# Patient Record
Sex: Male | Born: 1935 | Race: White | Hispanic: No | Marital: Married | Smoking: Former smoker
Health system: Southern US, Community
[De-identification: ages and names within clinical notes are randomized; demographics above are authoritative.]

## PROBLEM LIST (undated history)

## (undated) DIAGNOSIS — I4891 Unspecified atrial fibrillation: Secondary | ICD-10-CM

## (undated) DIAGNOSIS — E039 Hypothyroidism, unspecified: Secondary | ICD-10-CM

## (undated) DIAGNOSIS — I10 Essential (primary) hypertension: Secondary | ICD-10-CM

## (undated) DIAGNOSIS — R270 Ataxia, unspecified: Secondary | ICD-10-CM

## (undated) DIAGNOSIS — G459 Transient cerebral ischemic attack, unspecified: Secondary | ICD-10-CM

## (undated) DIAGNOSIS — G629 Polyneuropathy, unspecified: Secondary | ICD-10-CM

## (undated) DIAGNOSIS — N400 Enlarged prostate without lower urinary tract symptoms: Secondary | ICD-10-CM

## (undated) DIAGNOSIS — E785 Hyperlipidemia, unspecified: Secondary | ICD-10-CM

## (undated) DIAGNOSIS — I495 Sick sinus syndrome: Secondary | ICD-10-CM

## (undated) HISTORY — DX: Benign prostatic hyperplasia without lower urinary tract symptoms: N40.0

## (undated) HISTORY — DX: Transient cerebral ischemic attack, unspecified: G45.9

## (undated) HISTORY — DX: Ataxia, unspecified: R27.0

## (undated) HISTORY — DX: Essential (primary) hypertension: I10

## (undated) HISTORY — DX: Sick sinus syndrome: I49.5

## (undated) HISTORY — DX: Hypothyroidism, unspecified: E03.9

## (undated) HISTORY — DX: Unspecified atrial fibrillation: I48.91

## (undated) HISTORY — DX: Polyneuropathy, unspecified: G62.9

## (undated) HISTORY — DX: Hyperlipidemia, unspecified: E78.5

## (undated) HISTORY — PX: PACEMAKER INSERTION: SHX728

## (undated) HISTORY — PX: INSERT / REPLACE / REMOVE PACEMAKER: SUR710

---

## 2002-05-06 ENCOUNTER — Ambulatory Visit (HOSPITAL_COMMUNITY): Admission: RE | Admit: 2002-05-06 | Discharge: 2002-05-06 | Payer: Self-pay | Admitting: Gastroenterology

## 2002-05-06 ENCOUNTER — Encounter: Payer: Self-pay | Admitting: Gastroenterology

## 2002-06-15 ENCOUNTER — Ambulatory Visit (HOSPITAL_COMMUNITY): Admission: RE | Admit: 2002-06-15 | Discharge: 2002-06-15 | Payer: Self-pay | Admitting: Gastroenterology

## 2003-11-01 ENCOUNTER — Encounter: Admission: RE | Admit: 2003-11-01 | Discharge: 2003-11-01 | Payer: Self-pay | Admitting: Neurology

## 2004-07-26 ENCOUNTER — Emergency Department (HOSPITAL_COMMUNITY): Admission: EM | Admit: 2004-07-26 | Discharge: 2004-07-26 | Payer: Self-pay | Admitting: Emergency Medicine

## 2004-08-06 ENCOUNTER — Emergency Department (HOSPITAL_COMMUNITY): Admission: EM | Admit: 2004-08-06 | Discharge: 2004-08-06 | Payer: Self-pay | Admitting: Emergency Medicine

## 2004-10-09 ENCOUNTER — Ambulatory Visit: Payer: Self-pay | Admitting: *Deleted

## 2004-10-10 ENCOUNTER — Ambulatory Visit: Payer: Self-pay | Admitting: Internal Medicine

## 2004-10-23 ENCOUNTER — Ambulatory Visit: Payer: Self-pay

## 2004-11-07 ENCOUNTER — Ambulatory Visit: Payer: Self-pay | Admitting: Cardiology

## 2004-11-21 ENCOUNTER — Ambulatory Visit: Payer: Self-pay

## 2004-12-09 ENCOUNTER — Ambulatory Visit: Payer: Self-pay | Admitting: Cardiology

## 2004-12-21 ENCOUNTER — Ambulatory Visit: Payer: Self-pay | Admitting: Cardiology

## 2004-12-26 ENCOUNTER — Ambulatory Visit: Payer: Self-pay | Admitting: Cardiology

## 2004-12-27 ENCOUNTER — Ambulatory Visit: Payer: Self-pay | Admitting: Internal Medicine

## 2005-01-14 ENCOUNTER — Ambulatory Visit: Payer: Self-pay | Admitting: Cardiology

## 2005-01-20 ENCOUNTER — Ambulatory Visit: Payer: Self-pay | Admitting: *Deleted

## 2005-02-10 ENCOUNTER — Ambulatory Visit: Payer: Self-pay

## 2005-02-13 ENCOUNTER — Ambulatory Visit: Payer: Self-pay | Admitting: Cardiology

## 2005-03-03 ENCOUNTER — Ambulatory Visit: Payer: Self-pay | Admitting: Cardiology

## 2005-03-20 ENCOUNTER — Ambulatory Visit: Payer: Self-pay | Admitting: Cardiology

## 2005-04-01 ENCOUNTER — Ambulatory Visit: Payer: Self-pay | Admitting: Internal Medicine

## 2005-04-09 ENCOUNTER — Ambulatory Visit: Payer: Self-pay | Admitting: Internal Medicine

## 2005-04-09 ENCOUNTER — Ambulatory Visit: Payer: Self-pay | Admitting: Cardiology

## 2005-04-21 ENCOUNTER — Ambulatory Visit: Payer: Self-pay | Admitting: Cardiology

## 2005-04-29 ENCOUNTER — Ambulatory Visit: Payer: Self-pay | Admitting: *Deleted

## 2005-05-22 ENCOUNTER — Ambulatory Visit: Payer: Self-pay | Admitting: Cardiology

## 2005-05-29 ENCOUNTER — Ambulatory Visit: Payer: Self-pay | Admitting: Cardiology

## 2005-06-11 ENCOUNTER — Ambulatory Visit: Payer: Self-pay | Admitting: Cardiology

## 2005-06-16 ENCOUNTER — Ambulatory Visit: Payer: Self-pay | Admitting: Internal Medicine

## 2005-06-23 ENCOUNTER — Ambulatory Visit: Payer: Self-pay | Admitting: Cardiology

## 2005-06-24 ENCOUNTER — Ambulatory Visit: Payer: Self-pay | Admitting: Cardiology

## 2005-07-01 ENCOUNTER — Ambulatory Visit: Payer: Self-pay | Admitting: Cardiology

## 2005-07-28 ENCOUNTER — Ambulatory Visit: Payer: Self-pay | Admitting: Cardiology

## 2005-07-29 ENCOUNTER — Ambulatory Visit: Payer: Self-pay | Admitting: Cardiovascular Disease

## 2005-08-28 ENCOUNTER — Ambulatory Visit: Payer: Self-pay | Admitting: Cardiology

## 2005-09-01 ENCOUNTER — Ambulatory Visit: Payer: Self-pay | Admitting: Cardiology

## 2005-09-05 ENCOUNTER — Ambulatory Visit: Payer: Self-pay | Admitting: Internal Medicine

## 2005-09-26 ENCOUNTER — Ambulatory Visit: Payer: Self-pay | Admitting: Internal Medicine

## 2005-09-29 ENCOUNTER — Ambulatory Visit: Payer: Self-pay | Admitting: Internal Medicine

## 2005-10-02 ENCOUNTER — Ambulatory Visit: Payer: Self-pay | Admitting: Cardiology

## 2005-10-15 ENCOUNTER — Ambulatory Visit: Payer: Self-pay | Admitting: Cardiology

## 2005-11-04 ENCOUNTER — Ambulatory Visit: Payer: Self-pay | Admitting: Cardiology

## 2005-11-12 ENCOUNTER — Ambulatory Visit: Payer: Self-pay | Admitting: Cardiology

## 2005-12-08 ENCOUNTER — Ambulatory Visit: Payer: Self-pay | Admitting: Cardiology

## 2005-12-12 ENCOUNTER — Ambulatory Visit: Payer: Self-pay | Admitting: Internal Medicine

## 2005-12-29 ENCOUNTER — Ambulatory Visit: Payer: Self-pay | Admitting: Cardiology

## 2006-01-13 ENCOUNTER — Ambulatory Visit: Payer: Self-pay | Admitting: Cardiology

## 2006-01-16 ENCOUNTER — Emergency Department (HOSPITAL_COMMUNITY): Admission: EM | Admit: 2006-01-16 | Discharge: 2006-01-16 | Payer: Self-pay | Admitting: Emergency Medicine

## 2006-02-11 ENCOUNTER — Ambulatory Visit: Payer: Self-pay | Admitting: Cardiology

## 2006-02-12 ENCOUNTER — Ambulatory Visit: Payer: Self-pay | Admitting: Cardiology

## 2006-03-19 ENCOUNTER — Ambulatory Visit: Payer: Self-pay | Admitting: Internal Medicine

## 2006-04-01 ENCOUNTER — Ambulatory Visit: Payer: Self-pay | Admitting: Cardiology

## 2006-04-20 ENCOUNTER — Ambulatory Visit: Payer: Self-pay | Admitting: Internal Medicine

## 2006-04-23 ENCOUNTER — Ambulatory Visit: Payer: Self-pay | Admitting: Internal Medicine

## 2006-04-30 ENCOUNTER — Ambulatory Visit: Payer: Self-pay | Admitting: Internal Medicine

## 2006-05-04 ENCOUNTER — Ambulatory Visit: Payer: Self-pay | Admitting: Cardiology

## 2006-05-04 ENCOUNTER — Ambulatory Visit: Payer: Self-pay | Admitting: Internal Medicine

## 2006-05-18 ENCOUNTER — Emergency Department (HOSPITAL_COMMUNITY): Admission: EM | Admit: 2006-05-18 | Discharge: 2006-05-18 | Payer: Self-pay | Admitting: Family Medicine

## 2006-05-19 ENCOUNTER — Ambulatory Visit: Payer: Self-pay | Admitting: Cardiology

## 2006-06-01 ENCOUNTER — Ambulatory Visit: Payer: Self-pay | Admitting: Cardiology

## 2006-06-01 ENCOUNTER — Ambulatory Visit: Payer: Self-pay | Admitting: Cardiovascular Disease

## 2006-07-01 ENCOUNTER — Ambulatory Visit: Payer: Self-pay | Admitting: *Deleted

## 2006-07-22 ENCOUNTER — Ambulatory Visit: Payer: Self-pay | Admitting: Cardiology

## 2006-07-31 ENCOUNTER — Ambulatory Visit: Payer: Self-pay | Admitting: Cardiology

## 2006-08-18 ENCOUNTER — Encounter: Payer: Self-pay | Admitting: Internal Medicine

## 2006-08-18 LAB — CONVERTED CEMR LAB: PSA: 0.19 ng/mL

## 2006-08-19 ENCOUNTER — Ambulatory Visit: Payer: Self-pay | Admitting: Cardiology

## 2006-08-28 ENCOUNTER — Ambulatory Visit: Payer: Self-pay | Admitting: Cardiology

## 2006-09-28 ENCOUNTER — Ambulatory Visit: Payer: Self-pay | Admitting: Cardiology

## 2006-09-29 ENCOUNTER — Ambulatory Visit: Payer: Self-pay | Admitting: Cardiology

## 2006-10-26 ENCOUNTER — Ambulatory Visit: Payer: Self-pay | Admitting: Cardiology

## 2006-10-29 ENCOUNTER — Ambulatory Visit: Payer: Self-pay | Admitting: Internal Medicine

## 2006-11-02 ENCOUNTER — Ambulatory Visit: Payer: Self-pay | Admitting: Cardiology

## 2006-11-02 LAB — CONVERTED CEMR LAB
ALT: 15 units/L (ref 0–40)
AST: 21 units/L (ref 0–37)
Albumin: 3.9 g/dL (ref 3.5–5.2)
Alkaline Phosphatase: 66 units/L (ref 39–117)
BUN: 21 mg/dL (ref 6–23)
Bilirubin, Direct: 0.1 mg/dL (ref 0.0–0.3)
CO2: 31 meq/L (ref 19–32)
Calcium: 9.1 mg/dL (ref 8.4–10.5)
Chloride: 108 meq/L (ref 96–112)
Chol/HDL Ratio, serum: 3.8
Cholesterol: 160 mg/dL (ref 0–200)
Creatinine, Ser: 1.2 mg/dL (ref 0.4–1.5)
GFR calc non Af Amer: 62 mL/min
Glomerular Filtration Rate, Af Am: 75 mL/min/{1.73_m2}
Glucose, Bld: 111 mg/dL — ABNORMAL HIGH (ref 70–99)
HCT: 40.5 % (ref 39.0–52.0)
HDL: 42.5 mg/dL (ref >39.0)
Hemoglobin: 13.6 g/dL (ref 13.0–17.0)
LDL Cholesterol: 97 mg/dL (ref 0–99)
MCHC: 33.5 g/dL (ref 30.0–36.0)
MCV: 95.7 fL (ref 78.0–100.0)
Platelets: 219 10*3/uL (ref 150–400)
Potassium: 4.8 meq/L (ref 3.5–5.1)
RBC: 4.23 M/uL (ref 4.22–5.81)
RDW: 12.2 % (ref 11.5–14.6)
Sodium: 145 meq/L (ref 135–145)
TSH: 8.33 microintl units/mL — ABNORMAL HIGH (ref 0.35–5.50)
Total Bilirubin: 0.7 mg/dL (ref 0.3–1.2)
Total Protein: 7.2 g/dL (ref 6.0–8.3)
Triglyceride fasting, serum: 105 mg/dL (ref 0–149)
VLDL: 21 mg/dL (ref 0–40)
WBC: 4.5 10*3/uL (ref 4.5–10.5)

## 2006-11-09 ENCOUNTER — Ambulatory Visit: Payer: Self-pay | Admitting: Cardiology

## 2006-12-03 ENCOUNTER — Ambulatory Visit: Payer: Self-pay | Admitting: Internal Medicine

## 2006-12-03 ENCOUNTER — Ambulatory Visit: Payer: Self-pay | Admitting: Cardiology

## 2006-12-03 LAB — CONVERTED CEMR LAB: TSH: 4.95 microintl units/mL (ref 0.35–5.50)

## 2006-12-07 ENCOUNTER — Ambulatory Visit: Payer: Self-pay | Admitting: Internal Medicine

## 2006-12-21 ENCOUNTER — Ambulatory Visit: Payer: Self-pay | Admitting: Cardiology

## 2006-12-25 ENCOUNTER — Encounter: Admission: RE | Admit: 2006-12-25 | Discharge: 2006-12-25 | Payer: Self-pay | Admitting: Neurology

## 2007-01-01 ENCOUNTER — Ambulatory Visit: Payer: Self-pay | Admitting: Internal Medicine

## 2007-01-05 ENCOUNTER — Ambulatory Visit (HOSPITAL_COMMUNITY): Admission: RE | Admit: 2007-01-05 | Discharge: 2007-01-05 | Payer: Self-pay | Admitting: Internal Medicine

## 2007-01-19 ENCOUNTER — Ambulatory Visit: Payer: Self-pay | Admitting: Cardiology

## 2007-01-29 ENCOUNTER — Ambulatory Visit: Payer: Self-pay | Admitting: Internal Medicine

## 2007-02-08 ENCOUNTER — Ambulatory Visit: Payer: Self-pay | Admitting: Cardiology

## 2007-02-09 ENCOUNTER — Encounter: Admission: RE | Admit: 2007-02-09 | Discharge: 2007-02-09 | Payer: Self-pay | Admitting: Internal Medicine

## 2007-02-15 ENCOUNTER — Ambulatory Visit: Payer: Self-pay | Admitting: Cardiology

## 2007-02-22 ENCOUNTER — Ambulatory Visit: Payer: Self-pay | Admitting: Cardiology

## 2007-03-09 ENCOUNTER — Emergency Department (HOSPITAL_COMMUNITY): Admission: EM | Admit: 2007-03-09 | Discharge: 2007-03-09 | Payer: Self-pay | Admitting: Emergency Medicine

## 2007-03-17 ENCOUNTER — Ambulatory Visit: Payer: Self-pay | Admitting: Cardiology

## 2007-03-24 ENCOUNTER — Ambulatory Visit: Payer: Self-pay | Admitting: *Deleted

## 2007-04-12 ENCOUNTER — Ambulatory Visit: Payer: Self-pay | Admitting: Cardiology

## 2007-04-22 ENCOUNTER — Ambulatory Visit: Payer: Self-pay | Admitting: Cardiology

## 2007-04-22 ENCOUNTER — Ambulatory Visit: Payer: Self-pay

## 2007-04-22 LAB — CONVERTED CEMR LAB
ALT: 16 units/L (ref 0–40)
AST: 23 units/L (ref 0–37)
Albumin: 3.8 g/dL (ref 3.5–5.2)
Alkaline Phosphatase: 68 units/L (ref 39–117)
BUN: 18 mg/dL (ref 6–23)
Basophils Absolute: 0 10*3/uL (ref 0.0–0.1)
Basophils Relative: 0.4 % (ref 0.0–1.0)
Bilirubin, Direct: 0.1 mg/dL (ref 0.0–0.3)
CO2: 31 meq/L (ref 19–32)
Calcium: 8.8 mg/dL (ref 8.4–10.5)
Chloride: 107 meq/L (ref 96–112)
Cholesterol: 166 mg/dL (ref 0–200)
Creatinine, Ser: 1.2 mg/dL (ref 0.4–1.5)
Eosinophils Absolute: 0.1 10*3/uL (ref 0.0–0.6)
Eosinophils Relative: 2.2 % (ref 0.0–5.0)
GFR calc Af Amer: 75 mL/min
GFR calc non Af Amer: 62 mL/min
Glucose, Bld: 99 mg/dL (ref 70–99)
HCT: 37.8 % — ABNORMAL LOW (ref 39.0–52.0)
HDL: 34.3 mg/dL — ABNORMAL LOW (ref >39.0)
Hemoglobin: 12.8 g/dL — ABNORMAL LOW (ref 13.0–17.0)
INR: 2.5 — ABNORMAL HIGH (ref 0.9–2.0)
LDL Cholesterol: 101 mg/dL — ABNORMAL HIGH (ref 0–99)
Lymphocytes Relative: 28.3 % (ref 12.0–46.0)
MCHC: 34 g/dL (ref 30.0–36.0)
MCV: 94.1 fL (ref 78.0–100.0)
Monocytes Absolute: 0.4 10*3/uL (ref 0.2–0.7)
Monocytes Relative: 8.6 % (ref 3.0–11.0)
Neutro Abs: 2.6 10*3/uL (ref 1.4–7.7)
Neutrophils Relative %: 60.5 % (ref 43.0–77.0)
Platelets: 225 10*3/uL (ref 150–400)
Potassium: 4.1 meq/L (ref 3.5–5.1)
Prothrombin Time: 20 s — ABNORMAL HIGH (ref 10.0–14.0)
RBC: 4.01 M/uL — ABNORMAL LOW (ref 4.22–5.81)
RDW: 12.2 % (ref 11.5–14.6)
Sodium: 143 meq/L (ref 135–145)
TSH: 4.03 microintl units/mL (ref 0.35–5.50)
Total Bilirubin: 0.7 mg/dL (ref 0.3–1.2)
Total CHOL/HDL Ratio: 4.8
Total Protein: 6.7 g/dL (ref 6.0–8.3)
Triglycerides: 152 mg/dL — ABNORMAL HIGH (ref 0–149)
VLDL: 30 mg/dL (ref 0–40)
WBC: 4.3 10*3/uL — ABNORMAL LOW (ref 4.5–10.5)

## 2007-05-04 ENCOUNTER — Ambulatory Visit: Payer: Self-pay | Admitting: Cardiology

## 2007-05-10 ENCOUNTER — Ambulatory Visit: Payer: Self-pay | Admitting: Cardiology

## 2007-05-20 ENCOUNTER — Ambulatory Visit: Payer: Self-pay | Admitting: Internal Medicine

## 2007-06-03 ENCOUNTER — Ambulatory Visit: Payer: Self-pay | Admitting: Internal Medicine

## 2007-06-17 ENCOUNTER — Ambulatory Visit: Payer: Self-pay | Admitting: Cardiology

## 2007-07-05 ENCOUNTER — Ambulatory Visit: Payer: Self-pay | Admitting: Cardiology

## 2007-07-15 ENCOUNTER — Ambulatory Visit: Payer: Self-pay | Admitting: Cardiovascular Disease

## 2007-07-28 ENCOUNTER — Ambulatory Visit: Payer: Self-pay | Admitting: Cardiology

## 2007-07-28 LAB — CONVERTED CEMR LAB
ALT: 16 units/L (ref 0–53)
AST: 22 units/L (ref 0–37)
Albumin: 4 g/dL (ref 3.5–5.2)
Alkaline Phosphatase: 72 units/L (ref 39–117)
Bilirubin, Direct: 0.1 mg/dL (ref 0.0–0.3)
CK-MB: 1.6 ng/mL (ref 0.3–4.0)
Total Bilirubin: 0.5 mg/dL (ref 0.3–1.2)
Total CK: 74 units/L (ref 7–195)
Total Protein: 7.3 g/dL (ref 6.0–8.3)

## 2007-08-05 ENCOUNTER — Ambulatory Visit: Payer: Self-pay | Admitting: Cardiology

## 2007-08-16 ENCOUNTER — Ambulatory Visit: Payer: Self-pay | Admitting: Internal Medicine

## 2007-08-23 ENCOUNTER — Encounter: Payer: Self-pay | Admitting: Internal Medicine

## 2007-08-23 DIAGNOSIS — I6529 Occlusion and stenosis of unspecified carotid artery: Secondary | ICD-10-CM

## 2007-08-23 DIAGNOSIS — I495 Sick sinus syndrome: Secondary | ICD-10-CM

## 2007-08-23 DIAGNOSIS — E785 Hyperlipidemia, unspecified: Secondary | ICD-10-CM | POA: Insufficient documentation

## 2007-08-23 DIAGNOSIS — Z8679 Personal history of other diseases of the circulatory system: Secondary | ICD-10-CM | POA: Insufficient documentation

## 2007-08-26 ENCOUNTER — Encounter: Payer: Self-pay | Admitting: Internal Medicine

## 2007-08-26 DIAGNOSIS — I4891 Unspecified atrial fibrillation: Secondary | ICD-10-CM | POA: Insufficient documentation

## 2007-08-30 ENCOUNTER — Ambulatory Visit: Payer: Self-pay | Admitting: Cardiology

## 2007-08-30 ENCOUNTER — Ambulatory Visit: Payer: Self-pay | Admitting: Cardiovascular Disease

## 2007-09-27 ENCOUNTER — Ambulatory Visit: Payer: Self-pay | Admitting: Cardiology

## 2007-09-29 ENCOUNTER — Ambulatory Visit: Payer: Self-pay | Admitting: Internal Medicine

## 2007-09-30 ENCOUNTER — Ambulatory Visit: Payer: Self-pay | Admitting: Cardiology

## 2007-09-30 LAB — CONVERTED CEMR LAB
BUN: 20 mg/dL (ref 6–23)
Basophils Absolute: 0 10*3/uL (ref 0.0–0.1)
Basophils Relative: 0.6 % (ref 0.0–1.0)
CO2: 31 meq/L (ref 19–32)
Calcium: 9.3 mg/dL (ref 8.4–10.5)
Chloride: 104 meq/L (ref 96–112)
Creatinine, Ser: 1.1 mg/dL (ref 0.4–1.5)
Eosinophils Absolute: 0.1 10*3/uL (ref 0.0–0.6)
Eosinophils Relative: 1 % (ref 0.0–5.0)
GFR calc Af Amer: 83 mL/min
GFR calc non Af Amer: 68 mL/min
Glucose, Bld: 109 mg/dL — ABNORMAL HIGH (ref 70–99)
HCT: 40 % (ref 39.0–52.0)
Hemoglobin: 13.7 g/dL (ref 13.0–17.0)
INR: 2.6 — ABNORMAL HIGH (ref 0.8–1.0)
Lymphocytes Relative: 23.2 % (ref 12.0–46.0)
MCHC: 34.1 g/dL (ref 30.0–36.0)
MCV: 95.6 fL (ref 78.0–100.0)
Monocytes Absolute: 0.6 10*3/uL (ref 0.2–0.7)
Monocytes Relative: 8.8 % (ref 3.0–11.0)
Neutro Abs: 4.4 10*3/uL (ref 1.4–7.7)
Neutrophils Relative %: 66.4 % (ref 43.0–77.0)
Platelets: 230 10*3/uL (ref 150–400)
Potassium: 4.5 meq/L (ref 3.5–5.1)
Prothrombin Time: 20.1 s — ABNORMAL HIGH (ref 10.9–13.3)
RBC: 4.18 M/uL — ABNORMAL LOW (ref 4.22–5.81)
RDW: 12.5 % (ref 11.5–14.6)
Sodium: 143 meq/L (ref 135–145)
WBC: 6.7 10*3/uL (ref 4.5–10.5)
aPTT: 33.3 s — ABNORMAL HIGH (ref 21.7–29.8)

## 2007-10-04 ENCOUNTER — Ambulatory Visit: Payer: Self-pay | Admitting: Cardiology

## 2007-10-04 ENCOUNTER — Ambulatory Visit (HOSPITAL_COMMUNITY): Admission: RE | Admit: 2007-10-04 | Discharge: 2007-10-04 | Payer: Self-pay | Admitting: Cardiology

## 2007-10-18 ENCOUNTER — Ambulatory Visit: Payer: Self-pay

## 2007-10-22 ENCOUNTER — Ambulatory Visit: Payer: Self-pay | Admitting: Internal Medicine

## 2007-10-22 ENCOUNTER — Telehealth (INDEPENDENT_AMBULATORY_CARE_PROVIDER_SITE_OTHER): Payer: Self-pay | Admitting: *Deleted

## 2007-10-25 ENCOUNTER — Ambulatory Visit: Payer: Self-pay | Admitting: Internal Medicine

## 2007-10-25 DIAGNOSIS — R310 Gross hematuria: Secondary | ICD-10-CM | POA: Insufficient documentation

## 2007-10-25 DIAGNOSIS — I10 Essential (primary) hypertension: Secondary | ICD-10-CM | POA: Insufficient documentation

## 2007-10-25 LAB — CONVERTED CEMR LAB
BUN: 26 mg/dL — ABNORMAL HIGH (ref 6–23)
Bilirubin Urine: NEGATIVE
Creatinine, Ser: 1.3 mg/dL (ref 0.4–1.5)
Crystals: NEGATIVE
Hemoglobin, Urine: NEGATIVE
Leukocytes, UA: NEGATIVE
Mucus, UA: NEGATIVE
Nitrite: NEGATIVE
RBC / HPF: NONE SEEN
Specific Gravity, Urine: >=1.03 (ref 1.000–1.03)
Total Protein, Urine: NEGATIVE mg/dL
Urine Glucose: NEGATIVE mg/dL
Urobilinogen, UA: 0.2 (ref 0.0–1.0)
pH: 5 (ref 5.0–8.0)

## 2007-10-26 ENCOUNTER — Ambulatory Visit: Payer: Self-pay | Admitting: Internal Medicine

## 2007-10-26 DIAGNOSIS — J309 Allergic rhinitis, unspecified: Secondary | ICD-10-CM | POA: Insufficient documentation

## 2007-10-26 DIAGNOSIS — I739 Peripheral vascular disease, unspecified: Secondary | ICD-10-CM

## 2007-10-26 DIAGNOSIS — E039 Hypothyroidism, unspecified: Secondary | ICD-10-CM | POA: Insufficient documentation

## 2007-10-27 ENCOUNTER — Ambulatory Visit: Payer: Self-pay | Admitting: Cardiology

## 2007-11-01 ENCOUNTER — Telehealth (INDEPENDENT_AMBULATORY_CARE_PROVIDER_SITE_OTHER): Payer: Self-pay | Admitting: *Deleted

## 2007-11-02 ENCOUNTER — Telehealth (INDEPENDENT_AMBULATORY_CARE_PROVIDER_SITE_OTHER): Payer: Self-pay | Admitting: *Deleted

## 2007-11-05 ENCOUNTER — Encounter: Payer: Self-pay | Admitting: Internal Medicine

## 2007-11-19 ENCOUNTER — Ambulatory Visit: Payer: Self-pay | Admitting: Cardiology

## 2007-11-22 ENCOUNTER — Ambulatory Visit: Payer: Self-pay | Admitting: Cardiology

## 2007-11-22 LAB — CONVERTED CEMR LAB
ALT: 16 units/L (ref 0–53)
AST: 21 units/L (ref 0–37)
Albumin: 3.8 g/dL (ref 3.5–5.2)
Alkaline Phosphatase: 69 units/L (ref 39–117)
Bilirubin, Direct: 0.1 mg/dL (ref 0.0–0.3)
Cholesterol: 167 mg/dL (ref 0–200)
HDL: 40.8 mg/dL (ref >39.0)
LDL Cholesterol: 102 mg/dL — ABNORMAL HIGH (ref 0–99)
Total Bilirubin: 0.7 mg/dL (ref 0.3–1.2)
Total CHOL/HDL Ratio: 4.1
Total Protein: 7.5 g/dL (ref 6.0–8.3)
Triglycerides: 120 mg/dL (ref 0–149)
VLDL: 24 mg/dL (ref 0–40)

## 2007-11-29 ENCOUNTER — Ambulatory Visit: Payer: Self-pay | Admitting: Cardiology

## 2007-12-17 ENCOUNTER — Ambulatory Visit: Payer: Self-pay | Admitting: Cardiology

## 2007-12-30 ENCOUNTER — Ambulatory Visit: Payer: Self-pay | Admitting: Cardiology

## 2008-01-17 ENCOUNTER — Ambulatory Visit: Payer: Self-pay | Admitting: Cardiovascular Disease

## 2008-02-14 ENCOUNTER — Ambulatory Visit: Payer: Self-pay | Admitting: Internal Medicine

## 2008-03-14 ENCOUNTER — Ambulatory Visit: Payer: Self-pay

## 2008-03-20 ENCOUNTER — Ambulatory Visit: Payer: Self-pay | Admitting: Cardiology

## 2008-04-12 ENCOUNTER — Ambulatory Visit: Payer: Self-pay | Admitting: Cardiology

## 2008-04-13 ENCOUNTER — Ambulatory Visit: Payer: Self-pay | Admitting: Cardiology

## 2008-04-20 ENCOUNTER — Ambulatory Visit: Payer: Self-pay | Admitting: Internal Medicine

## 2008-05-22 ENCOUNTER — Ambulatory Visit: Payer: Self-pay | Admitting: Internal Medicine

## 2008-06-21 ENCOUNTER — Ambulatory Visit: Payer: Self-pay | Admitting: Cardiology

## 2008-06-28 ENCOUNTER — Ambulatory Visit: Payer: Self-pay

## 2008-06-28 ENCOUNTER — Ambulatory Visit: Payer: Self-pay | Admitting: Cardiology

## 2008-07-05 ENCOUNTER — Ambulatory Visit: Payer: Self-pay | Admitting: Cardiology

## 2008-07-19 ENCOUNTER — Ambulatory Visit: Payer: Self-pay | Admitting: Cardiology

## 2008-07-24 ENCOUNTER — Ambulatory Visit: Payer: Self-pay | Admitting: Cardiology

## 2008-08-03 ENCOUNTER — Ambulatory Visit: Payer: Self-pay | Admitting: Internal Medicine

## 2008-08-03 DIAGNOSIS — G4700 Insomnia, unspecified: Secondary | ICD-10-CM

## 2008-08-03 DIAGNOSIS — R5383 Other fatigue: Secondary | ICD-10-CM

## 2008-08-03 DIAGNOSIS — F411 Generalized anxiety disorder: Secondary | ICD-10-CM

## 2008-08-03 DIAGNOSIS — N400 Enlarged prostate without lower urinary tract symptoms: Secondary | ICD-10-CM

## 2008-08-03 DIAGNOSIS — R5381 Other malaise: Secondary | ICD-10-CM

## 2008-08-03 LAB — CONVERTED CEMR LAB
ALT: 17 units/L (ref 0–53)
AST: 22 units/L (ref 0–37)
Albumin: 4.2 g/dL (ref 3.5–5.2)
Alkaline Phosphatase: 77 units/L (ref 39–117)
BUN: 24 mg/dL — ABNORMAL HIGH (ref 6–23)
Bacteria, UA: NEGATIVE
Basophils Absolute: 0 10*3/uL (ref 0.0–0.1)
Basophils Relative: 0.1 % (ref 0.0–3.0)
Bilirubin Urine: NEGATIVE
Bilirubin, Direct: 0.1 mg/dL (ref 0.0–0.3)
CO2: 30 meq/L (ref 19–32)
Calcium: 9.2 mg/dL (ref 8.4–10.5)
Chloride: 107 meq/L (ref 96–112)
Cholesterol: 149 mg/dL (ref 0–200)
Creatinine, Ser: 1.2 mg/dL (ref 0.4–1.5)
Crystals: NEGATIVE
Eosinophils Absolute: 0.1 10*3/uL (ref 0.0–0.7)
Eosinophils Relative: 1.6 % (ref 0.0–5.0)
GFR calc Af Amer: 75 mL/min
GFR calc non Af Amer: 62 mL/min
Glucose, Bld: 116 mg/dL — ABNORMAL HIGH (ref 70–99)
HCT: 38.4 % — ABNORMAL LOW (ref 39.0–52.0)
HDL: 34.9 mg/dL — ABNORMAL LOW (ref >39.0)
Hemoglobin: 13.4 g/dL (ref 13.0–17.0)
Ketones, ur: NEGATIVE mg/dL
LDL Cholesterol: 91 mg/dL (ref 0–99)
Leukocytes, UA: NEGATIVE
Lymphocytes Relative: 26.5 % (ref 12.0–46.0)
MCHC: 34.9 g/dL (ref 30.0–36.0)
MCV: 94.9 fL (ref 78.0–100.0)
Monocytes Absolute: 0.5 10*3/uL (ref 0.1–1.0)
Monocytes Relative: 8.2 % (ref 3.0–12.0)
Neutro Abs: 3.7 10*3/uL (ref 1.4–7.7)
Neutrophils Relative %: 63.6 % (ref 43.0–77.0)
Nitrite: NEGATIVE
PSA: 0.11 ng/mL (ref 0.10–4.00)
Platelets: 223 10*3/uL (ref 150–400)
Potassium: 5.4 meq/L — ABNORMAL HIGH (ref 3.5–5.1)
RBC: 4.05 M/uL — ABNORMAL LOW (ref 4.22–5.81)
RDW: 12.4 % (ref 11.5–14.6)
Sodium: 142 meq/L (ref 135–145)
Specific Gravity, Urine: 1.015 (ref 1.000–1.03)
Squamous Epithelial / HPF: NEGATIVE /lpf
TSH: 3.98 microintl units/mL (ref 0.35–5.50)
Total Bilirubin: 0.8 mg/dL (ref 0.3–1.2)
Total CHOL/HDL Ratio: 4.3
Total Protein, Urine: NEGATIVE mg/dL
Total Protein: 7.5 g/dL (ref 6.0–8.3)
Triglycerides: 117 mg/dL (ref 0–149)
Urine Glucose: NEGATIVE mg/dL
Urobilinogen, UA: 0.2 (ref 0.0–1.0)
VLDL: 23 mg/dL (ref 0–40)
WBC: 5.8 10*3/uL (ref 4.5–10.5)
pH: 6.5 (ref 5.0–8.0)

## 2008-08-09 ENCOUNTER — Ambulatory Visit: Payer: Self-pay | Admitting: Internal Medicine

## 2008-08-11 ENCOUNTER — Telehealth (INDEPENDENT_AMBULATORY_CARE_PROVIDER_SITE_OTHER): Payer: Self-pay | Admitting: *Deleted

## 2008-08-18 ENCOUNTER — Encounter: Admission: RE | Admit: 2008-08-18 | Discharge: 2008-08-18 | Payer: Self-pay | Admitting: Orthopedic Surgery

## 2008-09-12 ENCOUNTER — Ambulatory Visit: Payer: Self-pay | Admitting: Internal Medicine

## 2008-09-14 ENCOUNTER — Ambulatory Visit: Payer: Self-pay | Admitting: Cardiology

## 2008-09-27 ENCOUNTER — Ambulatory Visit: Payer: Self-pay | Admitting: Cardiology

## 2008-09-28 ENCOUNTER — Ambulatory Visit: Payer: Self-pay | Admitting: Internal Medicine

## 2008-09-28 DIAGNOSIS — R3915 Urgency of urination: Secondary | ICD-10-CM | POA: Insufficient documentation

## 2008-09-28 DIAGNOSIS — R946 Abnormal results of thyroid function studies: Secondary | ICD-10-CM

## 2008-09-28 LAB — CONVERTED CEMR LAB
Bacteria, UA: NEGATIVE
Bilirubin Urine: NEGATIVE
Crystals: NEGATIVE
Ketones, ur: NEGATIVE mg/dL
Leukocytes, UA: NEGATIVE
Nitrite: NEGATIVE
Specific Gravity, Urine: 1.015 (ref 1.000–1.03)
Squamous Epithelial / HPF: NEGATIVE /lpf
TSH: 4.21 microintl units/mL (ref 0.35–5.50)
Total Protein, Urine: NEGATIVE mg/dL
Urine Glucose: NEGATIVE mg/dL
Urobilinogen, UA: 0.2 (ref 0.0–1.0)
WBC, UA: NONE SEEN cells/hpf
pH: 6.5 (ref 5.0–8.0)

## 2008-10-11 ENCOUNTER — Ambulatory Visit: Payer: Self-pay | Admitting: Internal Medicine

## 2008-10-17 ENCOUNTER — Telehealth (INDEPENDENT_AMBULATORY_CARE_PROVIDER_SITE_OTHER): Payer: Self-pay | Admitting: *Deleted

## 2008-10-27 ENCOUNTER — Ambulatory Visit: Payer: Self-pay | Admitting: Internal Medicine

## 2008-11-27 ENCOUNTER — Ambulatory Visit: Payer: Self-pay | Admitting: Cardiology

## 2008-12-06 ENCOUNTER — Encounter: Payer: Self-pay | Admitting: Internal Medicine

## 2008-12-15 ENCOUNTER — Ambulatory Visit: Payer: Self-pay | Admitting: Cardiology

## 2008-12-19 ENCOUNTER — Ambulatory Visit: Payer: Self-pay | Admitting: Cardiology

## 2008-12-19 LAB — CONVERTED CEMR LAB
ALT: 14 units/L (ref 0–53)
AST: 20 units/L (ref 0–37)
Albumin: 3.9 g/dL (ref 3.5–5.2)
Alkaline Phosphatase: 71 units/L (ref 39–117)
BUN: 29 mg/dL — ABNORMAL HIGH (ref 6–23)
Basophils Absolute: 0.1 10*3/uL (ref 0.0–0.1)
Basophils Relative: 1 % (ref 0.0–3.0)
Bilirubin, Direct: 0.1 mg/dL (ref 0.0–0.3)
CO2: 29 meq/L (ref 19–32)
Calcium: 9.2 mg/dL (ref 8.4–10.5)
Chloride: 111 meq/L (ref 96–112)
Cholesterol: 128 mg/dL (ref 0–200)
Creatinine, Ser: 1.4 mg/dL (ref 0.4–1.5)
Eosinophils Absolute: 0.1 10*3/uL (ref 0.0–0.7)
Eosinophils Relative: 1.8 % (ref 0.0–5.0)
GFR calc Af Amer: 62 mL/min
GFR calc non Af Amer: 52 mL/min
Glucose, Bld: 140 mg/dL — ABNORMAL HIGH (ref 70–99)
HCT: 39.5 % (ref 39.0–52.0)
HDL: 39.3 mg/dL (ref >39.0)
Hemoglobin: 13.5 g/dL (ref 13.0–17.0)
LDL Cholesterol: 71 mg/dL (ref 0–99)
Lymphocytes Relative: 27.6 % (ref 12.0–46.0)
MCHC: 34.2 g/dL (ref 30.0–36.0)
MCV: 96 fL (ref 78.0–100.0)
Monocytes Absolute: 0.5 10*3/uL (ref 0.1–1.0)
Monocytes Relative: 7.7 % (ref 3.0–12.0)
Neutro Abs: 3.8 10*3/uL (ref 1.4–7.7)
Neutrophils Relative %: 61.9 % (ref 43.0–77.0)
Platelets: 195 10*3/uL (ref 150–400)
Potassium: 4.3 meq/L (ref 3.5–5.1)
RBC: 4.11 M/uL — ABNORMAL LOW (ref 4.22–5.81)
RDW: 12 % (ref 11.5–14.6)
Sodium: 144 meq/L (ref 135–145)
Total Bilirubin: 0.7 mg/dL (ref 0.3–1.2)
Total CHOL/HDL Ratio: 3.3
Total Protein: 6.9 g/dL (ref 6.0–8.3)
Triglycerides: 87 mg/dL (ref 0–149)
VLDL: 17 mg/dL (ref 0–40)
WBC: 6.2 10*3/uL (ref 4.5–10.5)

## 2008-12-21 ENCOUNTER — Ambulatory Visit: Payer: Self-pay | Admitting: Cardiology

## 2008-12-21 LAB — CONVERTED CEMR LAB
Hgb A1c MFr Bld: 6.2 % — ABNORMAL HIGH (ref 4.6–6.0)
TSH: 4.25 microintl units/mL (ref 0.35–5.50)

## 2008-12-27 ENCOUNTER — Ambulatory Visit: Payer: Self-pay | Admitting: Cardiology

## 2009-01-10 ENCOUNTER — Ambulatory Visit: Payer: Self-pay | Admitting: Cardiology

## 2009-01-17 ENCOUNTER — Ambulatory Visit: Payer: Self-pay | Admitting: Internal Medicine

## 2009-01-25 ENCOUNTER — Ambulatory Visit: Payer: Self-pay | Admitting: Internal Medicine

## 2009-02-15 ENCOUNTER — Ambulatory Visit: Payer: Self-pay | Admitting: Internal Medicine

## 2009-02-17 ENCOUNTER — Telehealth: Payer: Self-pay | Admitting: Family Medicine

## 2009-02-23 DIAGNOSIS — R001 Bradycardia, unspecified: Secondary | ICD-10-CM | POA: Insufficient documentation

## 2009-02-23 DIAGNOSIS — R279 Unspecified lack of coordination: Secondary | ICD-10-CM | POA: Insufficient documentation

## 2009-02-23 DIAGNOSIS — I4949 Other premature depolarization: Secondary | ICD-10-CM

## 2009-03-09 ENCOUNTER — Encounter (INDEPENDENT_AMBULATORY_CARE_PROVIDER_SITE_OTHER): Payer: Self-pay | Admitting: Radiology

## 2009-03-12 ENCOUNTER — Ambulatory Visit: Payer: Self-pay | Admitting: Internal Medicine

## 2009-03-21 ENCOUNTER — Ambulatory Visit: Payer: Self-pay | Admitting: Cardiology

## 2009-03-21 ENCOUNTER — Ambulatory Visit: Payer: Self-pay

## 2009-04-10 ENCOUNTER — Ambulatory Visit: Payer: Self-pay | Admitting: Cardiology

## 2009-05-02 ENCOUNTER — Encounter: Payer: Self-pay | Admitting: *Deleted

## 2009-05-08 ENCOUNTER — Ambulatory Visit: Payer: Self-pay | Admitting: Cardiology

## 2009-05-08 LAB — CONVERTED CEMR LAB
POC INR: 2.5
Protime: 18.8

## 2009-06-06 ENCOUNTER — Encounter (INDEPENDENT_AMBULATORY_CARE_PROVIDER_SITE_OTHER): Payer: Self-pay | Admitting: Cardiology

## 2009-06-06 ENCOUNTER — Ambulatory Visit: Payer: Self-pay | Admitting: Internal Medicine

## 2009-06-06 ENCOUNTER — Encounter: Payer: Self-pay | Admitting: *Deleted

## 2009-06-06 LAB — CONVERTED CEMR LAB
POC INR: 3
Prothrombin Time: 21 s

## 2009-07-09 ENCOUNTER — Ambulatory Visit: Payer: Self-pay | Admitting: Cardiology

## 2009-07-09 LAB — CONVERTED CEMR LAB
POC INR: 2.4
Prothrombin Time: 19 s

## 2009-08-10 ENCOUNTER — Ambulatory Visit: Payer: Self-pay | Admitting: Internal Medicine

## 2009-08-10 LAB — CONVERTED CEMR LAB: POC INR: 2.8

## 2009-09-11 ENCOUNTER — Ambulatory Visit: Payer: Self-pay | Admitting: Cardiology

## 2009-09-11 LAB — CONVERTED CEMR LAB: POC INR: 2.7

## 2009-09-19 ENCOUNTER — Telehealth (INDEPENDENT_AMBULATORY_CARE_PROVIDER_SITE_OTHER): Payer: Self-pay | Admitting: *Deleted

## 2009-09-25 ENCOUNTER — Ambulatory Visit: Payer: Self-pay | Admitting: Cardiology

## 2009-09-25 DIAGNOSIS — IMO0002 Reserved for concepts with insufficient information to code with codable children: Secondary | ICD-10-CM | POA: Insufficient documentation

## 2009-09-25 DIAGNOSIS — Z950 Presence of cardiac pacemaker: Secondary | ICD-10-CM

## 2009-10-04 ENCOUNTER — Telehealth: Payer: Self-pay | Admitting: Cardiology

## 2009-10-04 LAB — CONVERTED CEMR LAB
ALT: 15 units/L (ref 0–53)
AST: 21 units/L (ref 0–37)
Albumin: 3.9 g/dL (ref 3.5–5.2)
Alkaline Phosphatase: 67 units/L (ref 39–117)
BUN: 32 mg/dL — ABNORMAL HIGH (ref 6–23)
Basophils Absolute: 0 10*3/uL (ref 0.0–0.1)
Basophils Relative: 0.8 % (ref 0.0–3.0)
Bilirubin, Direct: 0 mg/dL (ref 0.0–0.3)
CO2: 29 meq/L (ref 19–32)
Calcium: 8.8 mg/dL (ref 8.4–10.5)
Chloride: 103 meq/L (ref 96–112)
Cholesterol: 161 mg/dL (ref 0–200)
Creatinine, Ser: 1.3 mg/dL (ref 0.4–1.5)
Eosinophils Absolute: 0.1 10*3/uL (ref 0.0–0.7)
Eosinophils Relative: 2.1 % (ref 0.0–5.0)
GFR calc non Af Amer: 56.04 mL/min (ref >60)
Glucose, Bld: 117 mg/dL — ABNORMAL HIGH (ref 70–99)
HCT: 38 % — ABNORMAL LOW (ref 39.0–52.0)
HDL: 42.2 mg/dL (ref >39.00)
Hemoglobin: 13.1 g/dL (ref 13.0–17.0)
LDL Cholesterol: 90 mg/dL (ref 0–99)
Lymphocytes Relative: 23 % (ref 12.0–46.0)
Lymphs Abs: 1.2 10*3/uL (ref 0.7–4.0)
MCHC: 34.6 g/dL (ref 30.0–36.0)
MCV: 98.3 fL (ref 78.0–100.0)
Monocytes Absolute: 0.4 10*3/uL (ref 0.1–1.0)
Monocytes Relative: 8 % (ref 3.0–12.0)
Neutro Abs: 3.6 10*3/uL (ref 1.4–7.7)
Neutrophils Relative %: 66.1 % (ref 43.0–77.0)
Platelets: 215 10*3/uL (ref 150.0–400.0)
Potassium: 4.7 meq/L (ref 3.5–5.1)
RBC: 3.86 M/uL — ABNORMAL LOW (ref 4.22–5.81)
RDW: 12.6 % (ref 11.5–14.6)
Sodium: 140 meq/L (ref 135–145)
TSH: 3.59 microintl units/mL (ref 0.35–5.50)
Total Bilirubin: 0.8 mg/dL (ref 0.3–1.2)
Total CHOL/HDL Ratio: 4
Total Protein: 6.8 g/dL (ref 6.0–8.3)
Triglycerides: 142 mg/dL (ref 0.0–149.0)
VLDL: 28.4 mg/dL (ref 0.0–40.0)
WBC: 5.3 10*3/uL (ref 4.5–10.5)

## 2009-10-12 ENCOUNTER — Ambulatory Visit: Payer: Self-pay | Admitting: Cardiology

## 2009-10-12 LAB — CONVERTED CEMR LAB: POC INR: 3.2

## 2009-11-12 ENCOUNTER — Ambulatory Visit: Payer: Self-pay | Admitting: Cardiovascular Disease

## 2009-12-17 ENCOUNTER — Ambulatory Visit: Payer: Self-pay | Admitting: Cardiology

## 2009-12-17 LAB — CONVERTED CEMR LAB: POC INR: 2.8

## 2010-01-17 ENCOUNTER — Ambulatory Visit: Payer: Self-pay | Admitting: Cardiology

## 2010-01-17 LAB — CONVERTED CEMR LAB: POC INR: 2

## 2010-01-24 ENCOUNTER — Ambulatory Visit: Payer: Self-pay | Admitting: Cardiology

## 2010-02-14 ENCOUNTER — Ambulatory Visit: Payer: Self-pay | Admitting: Internal Medicine

## 2010-02-14 LAB — CONVERTED CEMR LAB: POC INR: 2.3

## 2010-03-07 ENCOUNTER — Telehealth: Payer: Self-pay | Admitting: Cardiology

## 2010-03-15 ENCOUNTER — Ambulatory Visit: Payer: Self-pay | Admitting: Internal Medicine

## 2010-03-15 LAB — CONVERTED CEMR LAB: POC INR: 2.4

## 2010-04-08 ENCOUNTER — Ambulatory Visit: Payer: Self-pay

## 2010-04-08 ENCOUNTER — Encounter: Payer: Self-pay | Admitting: Cardiology

## 2010-04-10 ENCOUNTER — Ambulatory Visit: Payer: Self-pay | Admitting: Cardiology

## 2010-04-11 ENCOUNTER — Ambulatory Visit: Payer: Self-pay | Admitting: Cardiology

## 2010-04-12 LAB — CONVERTED CEMR LAB
ALT: 17 units/L (ref 0–53)
AST: 22 units/L (ref 0–37)
Albumin: 3.8 g/dL (ref 3.5–5.2)
Alkaline Phosphatase: 71 units/L (ref 39–117)
BUN: 24 mg/dL — ABNORMAL HIGH (ref 6–23)
Basophils Absolute: 0 10*3/uL (ref 0.0–0.1)
Basophils Relative: 0.5 % (ref 0.0–3.0)
Bilirubin, Direct: 0.1 mg/dL (ref 0.0–0.3)
CO2: 31 meq/L (ref 19–32)
Calcium: 8.7 mg/dL (ref 8.4–10.5)
Chloride: 104 meq/L (ref 96–112)
Cholesterol: 150 mg/dL (ref 0–200)
Creatinine, Ser: 1.1 mg/dL (ref 0.4–1.5)
Eosinophils Absolute: 0.2 10*3/uL (ref 0.0–0.7)
Eosinophils Relative: 4.3 % (ref 0.0–5.0)
GFR calc non Af Amer: 68.59 mL/min (ref >60)
Glucose, Bld: 94 mg/dL (ref 70–99)
HCT: 38.1 % — ABNORMAL LOW (ref 39.0–52.0)
HDL: 37.6 mg/dL — ABNORMAL LOW (ref >39.00)
Hemoglobin: 13.1 g/dL (ref 13.0–17.0)
LDL Cholesterol: 88 mg/dL (ref 0–99)
Lymphocytes Relative: 27.6 % (ref 12.0–46.0)
Lymphs Abs: 1.3 10*3/uL (ref 0.7–4.0)
MCHC: 34.4 g/dL (ref 30.0–36.0)
MCV: 96.6 fL (ref 78.0–100.0)
Monocytes Absolute: 0.5 10*3/uL (ref 0.1–1.0)
Monocytes Relative: 9.5 % (ref 3.0–12.0)
Neutro Abs: 2.8 10*3/uL (ref 1.4–7.7)
Neutrophils Relative %: 58.1 % (ref 43.0–77.0)
Platelets: 204 10*3/uL (ref 150.0–400.0)
Potassium: 4.4 meq/L (ref 3.5–5.1)
RBC: 3.94 M/uL — ABNORMAL LOW (ref 4.22–5.81)
RDW: 13.7 % (ref 11.5–14.6)
Sodium: 142 meq/L (ref 135–145)
TSH: 5.79 microintl units/mL — ABNORMAL HIGH (ref 0.35–5.50)
Total Bilirubin: 0.8 mg/dL (ref 0.3–1.2)
Total CHOL/HDL Ratio: 4
Total Protein: 6.4 g/dL (ref 6.0–8.3)
Triglycerides: 121 mg/dL (ref 0.0–149.0)
VLDL: 24.2 mg/dL (ref 0.0–40.0)
WBC: 4.9 10*3/uL (ref 4.5–10.5)

## 2010-04-15 ENCOUNTER — Ambulatory Visit: Payer: Self-pay | Admitting: Cardiovascular Disease

## 2010-04-15 LAB — CONVERTED CEMR LAB: POC INR: 2

## 2010-04-16 ENCOUNTER — Ambulatory Visit: Payer: Self-pay | Admitting: Cardiology

## 2010-04-16 ENCOUNTER — Telehealth: Payer: Self-pay | Admitting: Cardiology

## 2010-04-19 ENCOUNTER — Telehealth: Payer: Self-pay | Admitting: Cardiology

## 2010-04-23 ENCOUNTER — Ambulatory Visit: Payer: Self-pay | Admitting: Cardiology

## 2010-05-14 ENCOUNTER — Ambulatory Visit: Payer: Self-pay | Admitting: Cardiovascular Disease

## 2010-05-14 LAB — CONVERTED CEMR LAB: POC INR: 2.7

## 2010-06-14 ENCOUNTER — Ambulatory Visit: Payer: Self-pay | Admitting: Cardiology

## 2010-06-14 LAB — CONVERTED CEMR LAB: POC INR: 2.1

## 2010-06-24 ENCOUNTER — Telehealth: Payer: Self-pay | Admitting: Cardiology

## 2010-07-01 ENCOUNTER — Telehealth: Payer: Self-pay | Admitting: Cardiology

## 2010-07-05 ENCOUNTER — Telehealth: Payer: Self-pay | Admitting: Cardiology

## 2010-07-09 ENCOUNTER — Encounter (INDEPENDENT_AMBULATORY_CARE_PROVIDER_SITE_OTHER): Payer: Self-pay | Admitting: *Deleted

## 2010-07-10 ENCOUNTER — Ambulatory Visit: Payer: Self-pay | Admitting: Cardiology

## 2010-07-15 ENCOUNTER — Ambulatory Visit: Payer: Self-pay | Admitting: Cardiology

## 2010-07-15 LAB — CONVERTED CEMR LAB: POC INR: 2.9

## 2010-07-16 ENCOUNTER — Telehealth: Payer: Self-pay | Admitting: Cardiology

## 2010-07-17 ENCOUNTER — Encounter: Payer: Self-pay | Admitting: Cardiology

## 2010-07-17 ENCOUNTER — Telehealth: Payer: Self-pay | Admitting: Cardiology

## 2010-07-18 ENCOUNTER — Ambulatory Visit: Payer: Self-pay | Admitting: Cardiology

## 2010-07-18 DIAGNOSIS — G609 Hereditary and idiopathic neuropathy, unspecified: Secondary | ICD-10-CM | POA: Insufficient documentation

## 2010-07-25 ENCOUNTER — Encounter: Payer: Self-pay | Admitting: Cardiology

## 2010-07-31 ENCOUNTER — Telehealth: Payer: Self-pay | Admitting: Cardiology

## 2010-08-09 ENCOUNTER — Telehealth: Payer: Self-pay | Admitting: Cardiology

## 2010-08-19 ENCOUNTER — Ambulatory Visit: Payer: Self-pay | Admitting: Cardiovascular Disease

## 2010-08-19 LAB — CONVERTED CEMR LAB: POC INR: 1.9

## 2010-08-20 ENCOUNTER — Ambulatory Visit: Payer: Self-pay | Admitting: Cardiology

## 2010-08-20 ENCOUNTER — Ambulatory Visit: Payer: Self-pay

## 2010-08-20 DIAGNOSIS — R609 Edema, unspecified: Secondary | ICD-10-CM | POA: Insufficient documentation

## 2010-08-22 LAB — CONVERTED CEMR LAB
ALT: 33 units/L (ref 0–53)
AST: 32 units/L (ref 0–37)
Albumin: 4.2 g/dL (ref 3.5–5.2)
Alkaline Phosphatase: 61 units/L (ref 39–117)
BUN: 25 mg/dL — ABNORMAL HIGH (ref 6–23)
Basophils Absolute: 0 10*3/uL (ref 0.0–0.1)
Basophils Relative: 0.4 % (ref 0.0–3.0)
Bilirubin, Direct: 0.1 mg/dL (ref 0.0–0.3)
CO2: 28 meq/L (ref 19–32)
Calcium: 9.2 mg/dL (ref 8.4–10.5)
Chloride: 105 meq/L (ref 96–112)
Cholesterol: 179 mg/dL (ref 0–200)
Creatinine, Ser: 1.2 mg/dL (ref 0.4–1.5)
Eosinophils Absolute: 0.1 10*3/uL (ref 0.0–0.7)
Eosinophils Relative: 2 % (ref 0.0–5.0)
GFR calc non Af Amer: 59.06 mL/min (ref >60)
Glucose, Bld: 104 mg/dL — ABNORMAL HIGH (ref 70–99)
HCT: 39.6 % (ref 39.0–52.0)
HDL: 44.4 mg/dL (ref >39.00)
Hemoglobin: 13.3 g/dL (ref 13.0–17.0)
LDL Cholesterol: 108 mg/dL — ABNORMAL HIGH (ref 0–99)
Lymphocytes Relative: 19.4 % (ref 12.0–46.0)
Lymphs Abs: 1.2 10*3/uL (ref 0.7–4.0)
MCHC: 33.6 g/dL (ref 30.0–36.0)
MCV: 97.2 fL (ref 78.0–100.0)
Monocytes Absolute: 0.4 10*3/uL (ref 0.1–1.0)
Monocytes Relative: 7.1 % (ref 3.0–12.0)
Neutro Abs: 4.4 10*3/uL (ref 1.4–7.7)
Neutrophils Relative %: 71.1 % (ref 43.0–77.0)
Platelets: 216 10*3/uL (ref 150.0–400.0)
Potassium: 4.2 meq/L (ref 3.5–5.1)
RBC: 4.07 M/uL — ABNORMAL LOW (ref 4.22–5.81)
RDW: 14 % (ref 11.5–14.6)
Sodium: 142 meq/L (ref 135–145)
TSH: 6.36 microintl units/mL — ABNORMAL HIGH (ref 0.35–5.50)
Total Bilirubin: 0.3 mg/dL (ref 0.3–1.2)
Total CHOL/HDL Ratio: 4
Total Protein: 6.9 g/dL (ref 6.0–8.3)
Triglycerides: 131 mg/dL (ref 0.0–149.0)
VLDL: 26.2 mg/dL (ref 0.0–40.0)
WBC: 6.2 10*3/uL (ref 4.5–10.5)

## 2010-08-23 ENCOUNTER — Encounter: Payer: Self-pay | Admitting: Cardiology

## 2010-08-29 ENCOUNTER — Ambulatory Visit: Payer: Self-pay | Admitting: Cardiology

## 2010-08-29 DIAGNOSIS — I119 Hypertensive heart disease without heart failure: Secondary | ICD-10-CM

## 2010-09-02 ENCOUNTER — Ambulatory Visit: Payer: Self-pay

## 2010-09-02 ENCOUNTER — Ambulatory Visit: Payer: Self-pay | Admitting: Internal Medicine

## 2010-09-02 ENCOUNTER — Encounter: Payer: Self-pay | Admitting: Cardiology

## 2010-09-02 ENCOUNTER — Ambulatory Visit: Payer: Self-pay | Admitting: Cardiology

## 2010-09-02 ENCOUNTER — Ambulatory Visit (HOSPITAL_COMMUNITY): Admission: RE | Admit: 2010-09-02 | Discharge: 2010-09-02 | Payer: Self-pay | Admitting: Cardiology

## 2010-09-02 LAB — CONVERTED CEMR LAB: POC INR: 2.1

## 2010-09-17 ENCOUNTER — Telehealth: Payer: Self-pay | Admitting: Cardiology

## 2010-09-17 LAB — CONVERTED CEMR LAB
BUN: 37 mg/dL — ABNORMAL HIGH (ref 6–23)
CO2: 29 meq/L (ref 19–32)
Calcium: 8.8 mg/dL (ref 8.4–10.5)
Chloride: 108 meq/L (ref 96–112)
Creatinine, Ser: 1.4 mg/dL (ref 0.4–1.5)
GFR calc non Af Amer: 50.09 mL/min (ref >60)
Glucose, Bld: 101 mg/dL — ABNORMAL HIGH (ref 70–99)
Potassium: 4.8 meq/L (ref 3.5–5.1)
Sodium: 141 meq/L (ref 135–145)

## 2010-09-27 ENCOUNTER — Encounter (INDEPENDENT_AMBULATORY_CARE_PROVIDER_SITE_OTHER): Payer: Self-pay | Admitting: *Deleted

## 2010-10-07 ENCOUNTER — Ambulatory Visit: Payer: Self-pay | Admitting: Internal Medicine

## 2010-10-07 LAB — CONVERTED CEMR LAB: POC INR: 2.1

## 2010-10-23 ENCOUNTER — Encounter: Payer: Self-pay | Admitting: Cardiology

## 2010-10-29 ENCOUNTER — Encounter: Payer: Self-pay | Admitting: Cardiology

## 2010-10-29 ENCOUNTER — Ambulatory Visit: Payer: Self-pay | Admitting: Cardiology

## 2010-11-05 ENCOUNTER — Ambulatory Visit: Payer: Self-pay | Admitting: Cardiology

## 2010-11-05 ENCOUNTER — Telehealth: Payer: Self-pay | Admitting: Cardiology

## 2010-11-05 ENCOUNTER — Ambulatory Visit: Payer: Self-pay | Admitting: Cardiovascular Disease

## 2010-11-05 LAB — CONVERTED CEMR LAB
POC INR: 2.7
TSH: 4.99 u[IU]/mL (ref 0.35–5.50)

## 2010-11-12 ENCOUNTER — Telehealth: Payer: Self-pay | Admitting: Cardiology

## 2010-12-03 ENCOUNTER — Ambulatory Visit: Admission: RE | Admit: 2010-12-03 | Discharge: 2010-12-03 | Payer: Self-pay | Source: Home / Self Care

## 2010-12-22 ENCOUNTER — Encounter: Payer: Self-pay | Admitting: Neurology

## 2010-12-31 NOTE — Medication Information (Signed)
Summary: rov/sp  Anticoagulant Therapy  Managed by: Weston Brass, PharmD Referring MD: Charlies Constable MD PCP: dr Theressa Millard MD: Excell Seltzer MD, Casimiro Needle Indication 1: Atrial Fibrillation (ICD-427.31) Indication 2: Hyperlipidemia (ICD-Incr L) Lab Used: [facility] Site: [facility] INR POC 1.9 INR RANGE 2 - 3  Dietary changes: yes       Details: been eating salad every day past 10 days: was on a bus tour  Health status changes: no    Bleeding/hemorrhagic complications: no    Recent/future hospitalizations: no    Any changes in medication regimen? no    Recent/future dental: no  Any missed doses?: no       Is patient compliant with meds? yes       Allergies: No Known Drug Allergies  Anticoagulation Management History:      The patient is taking warfarin and comes in today for a routine follow up visit.  Positive risk factors for bleeding include an age of 75 years or older.  The bleeding index is 'intermediate risk'.  Positive CHADS2 values include History of HTN and Age > 75 years old.  The start date was 03/12/1998.  His last INR was 2.6 RATIO.  Anticoagulation responsible provider: Excell Seltzer MD, Casimiro Needle.  INR POC: 1.9.  Cuvette Lot#: [id number].  Exp: 10/2011.    Anticoagulation Management Assessment/Plan:      The patient's current anticoagulation dose is Coumadin 5 mg  tabs: Take as directed by coumadin clinic..  The target INR is 2 - 3.  The next INR is due 09/09/2010.  Anticoagulation instructions were given to patient.  Results were reviewed/authorized by Weston Brass, PharmD.  He was notified by Kennieth Francois.         Current Anticoagulation Instructions: INR 1.9  Take one and one-half tablets today (Monday 9/19), then resume one-half tablet every day except one tablet on Monday, Wednesday, and Friday.  We will see you in three weeks.

## 2010-12-31 NOTE — Progress Notes (Signed)
Summary: Off Coumadin for Colonoscopy  ---- Converted from flag ---- ---- 07/15/2010 9:12 PM, Lenoria Farrier, MD, [facility] wrote: Markham Jordan, Yes, We should either do colonoscopy on coumadin or bridge with lovenox. BB  ---- 07/15/2010 12:03 PM, Weston Brass PharmD wrote: I saw where pt has been cleared to have colonoscopy but should we bridge him?  He has a history of CVA and TIAs.  Thanks. ------------------------------  Phone Note Outgoing Call   Call placed by: Weston Brass PharmD,  July 16, 2010 11:21 AM Call placed to: Patient Summary of Call: Spoke with pt.  He is aware will need to do Lovenox bridging.  He is unsure of how to give the injections so he will pick up the medication and come to the office on Monday for [place] to teach him how to give his first injection.  See Coumadin note for full dosing instructions.  Initial call taken by: Weston Brass PharmD,  July 16, 2010 11:27 AM

## 2010-12-31 NOTE — Cardiovascular Report (Signed)
Summary: TTM   TTM   Imported By: Roderic Ovens 02/28/2010 13:29:02  _____________________________________________________________________  External Attachment:    Type:   Image     Comment:   External Document

## 2010-12-31 NOTE — Assessment & Plan Note (Signed)
Summary: rov      Allergies Added: NKDA  Visit Type:  Follow-up Primary Provider:  dr Susa Simmonds   History of Present Illness: the patient returns for an unscheduled visit because of symptoms of palpitations and nervousness. He was just here a day or 2 ago and we interrogated his pacemaker but did not change any settings. He says since that time he has had a feeling of shaking in his chest. He notices this quite a bit at night especially when he lies on his right side.  His problems include sick sinus syndrome with paroxysmal atrial fibrillation managed with amiodarone and Coumadin. He also has hypertension and hyperlipidemia and a history of a TIA.  Current Medications (verified): 1)  Pacerone 100 Mg  Tabs (Amiodarone Hcl) .Marland Kitchen.. 1po Once Daily 2)  Coumadin 5 Mg  Tabs (Warfarin Sodium) .... Take As Directed By Coumadin Clinic. 3)  Aspirin Adult Low Strength 81 Mg Tbec (Aspirin) .... Take 1 Tablet By Mouth Once A Day 4)  Synthroid 25 Mcg  Tabs (Levothyroxine Sodium) .Marland Kitchen.. 1 By Mouth Qd 5)  Diltiazem Hcl Er Beads 120 Mg Xr24h-Cap (Diltiazem Hcl Er Beads) .... Take One Capsule By Mouth Daily 6)  Zocor 20 Mg Tabs (Simvastatin) .... Take 1 Tablet By Mouth Once A Day 7)  Docusate Sodium 100 Mg Caps (Docusate Sodium) .... Take 1 Tablet By Mouth Once A Day 8)  Multivitamins  Tabs (Multiple Vitamin) .... Take 1 Tablet By Mouth Once A Day 9)  Zolpidem Tartrate 10 Mg Tabs (Zolpidem Tartrate) .... 1/2 By Mouth At Bedtime As Needed 10)  Ativan 1 Mg Tabs (Lorazepam) .... Half To 1 By Mouth Once Daily As Needed  Allergies (verified): No Known Drug Allergies  Past History:  Past Medical History: Reviewed history from 07/26/2009 and no changes required. BRADYCARDIA (ICD-427.89) PREMATURE VENTRICULAR CONTRACTIONS (ICD-427.69) ATAXIA (ICD-781.3) THYROID FUNCTION TEST, ABNORMAL (ICD-794.5) URINARY URGENCY (ICD-788.63) SPECIAL SCREENING MALIG NEOPLASMS OTHER SITES (ICD-V76.49) BENIGN PROSTATIC  HYPERTROPHY (ICD-600.00) FATIGUE (ICD-780.79) INSOMNIA-SLEEP DISORDER-UNSPEC (ICD-780.52) ANXIETY (ICD-300.00) PERIPHERAL VASCULAR DISEASE (ICD-443.9) ALLERGIC RHINITIS (ICD-477.9) HYPOTHYROIDISM (ICD-244.9) 1. Palpitations with documented premature ventricular contractions. 2. Paroxysmal atrial fibrillation, controlled with amiodarone. 3. Status post dual-mode, dual-pacing, dual-sensing pacemaker with     generator change with a [employer] Zephyr generator, August 2009. 4. Hypertension. 5. Hyperlipidemia. 6. History of transient ischemic attack.  HYPERTENSION, BENIGN ESSENTIAL (ICD-401.1) ENCOUNTER FOR LONG-TERM USE OF OTHER MEDICATIONS (ICD-V58.69) GROSS HEMATURIA (ICD-599.71) CEREBROVASCULAR ACCIDENT, HX OF (ICD-V12.50) ANTICOAGULATION THERAPY (ICD-V58.61) ATRIAL FIBRILLATION (ICD-427.31) SICK SINUS SYNDROME (ICD-427.81) CAROTID ARTERY STENOSIS (ICD-433.10) TRANSIENT ISCHEMIC ATTACK, HX OF (ICD-V12.50) HYPERLIPIDEMIA (ICD-272.4)    Review of Systems       ROS is negative except as outlined in HPI.   Vital Signs:  Patient profile:   77 year old male Height:      69 inches Weight:      157 pounds BMI:     23.27 Pulse rate:   59 / minute BP sitting:   125 / 68  (left arm) Cuff size:   regular  Vitals Entered By: Burnett Kanaris, CNA (Apr 16, 2010 4:21 PM)  Physical Exam  Additional Exam:  Gen. Well-nourished, in no distress   Neck: No JVD, thyroid not enlarged, no carotid bruits Lungs: No tachypnea, clear without rales, rhonchi or wheezes Cardiovascular: Rhythm regular, PMI not displaced,  heart sounds  normal, no murmurs or gallops, no peripheral edema, pulses normal in all 4 extremities. Abdomen: BS normal, abdomen soft and non-tender without masses or organomegaly,  no hepatosplenomegaly. MS: No deformities, no cyanosis or clubbing   Neuro:  No focal sns   Skin:  no lesions    PPM Specifications Following MD:  Everardo Beals. Juanda Chance, MD     PPM Vendor:  [employer] Jude     PPM  Model Number:  [id number]     PPM Serial Number:  [id number] PPM DOI:  10/14/2007     PPM Implanting MD:  Everardo Beals. Juanda Chance, MD  Lead 1    Location: [facility]     DOI: 10/01/1995     Model #: [id number]     Serial #: [id number]     Status: active Lead 2    Location: [facility]     DOI: 09/13/1995     Model #: [id number]     Serial #: [id number]     Status: active   Indications:  Sick Sinus Syndrome,PAF   PPM Follow Up Pacer Dependent:  No      Episodes Coumadin:  Yes  Parameters Mode:  DDDR     Lower Rate Limit:  60     Upper Rate Limit:  90 Paced AV Delay:  300     Sensed AV Delay:  275  Impression & Recommendations:  Problem # 1:  PACEMAKER (ICD-V45.Marland Kitchen01) He has symptoms of palpitations and shaking which he thinks might be related to his pacemaker settings. We interrogated his pacemaker with Arlys John today and there was no change in the settings. He did have a slightly aggressive rate response setting and we cut this back. Hopefully this will help. If it doesn't then we may want to consider an event monitor but can document what the problem is. I told him if he has any symptoms to check his pulse rate and this would be helpful in deciding if this is related to his pacemaker or not.  Problem # 2:  ATRIAL FIBRILLATION (ICD-427.31) He has had no recurrance in a long time on current Rx. His updated medication list for this problem includes:    Pacerone 100 Mg Tabs (Amiodarone hcl) .Marland Kitchen... 1po once daily    Coumadin 5 Mg Tabs (Warfarin sodium) .Marland Kitchen... Take as directed by coumadin clinic.    Aspirin Adult Low Strength 81 Mg Tbec (Aspirin) .Marland Kitchen... Take 1 tablet by mouth once a day  Problem # 3:  HYPERTENSION, BENIGN ESSENTIAL (ICD-401.1) Controlled on current Rx.  His updated medication list for this problem includes:    Aspirin Adult Low Strength 81 Mg Tbec (Aspirin) .Marland Kitchen... Take 1 tablet by mouth once a day    Diltiazem Hcl Er Beads 120 Mg Xr24h-cap (Diltiazem hcl er beads) .Marland Kitchen... Take one capsule by mouth daily

## 2010-12-31 NOTE — Letter (Signed)
Summary: Patients Concerns   Patients Concerns   Imported By: Roderic Ovens 09/09/2010 16:43:25  _____________________________________________________________________  External Attachment:    Type:   Image     Comment:   External Document

## 2010-12-31 NOTE — Miscellaneous (Signed)
Summary: Appointment Canceled  Appointment status changed to canceled by [employer] on 08/27/2010 2:21 PM.  Cancellation Comments --------------------- echo dx  Appointment Information ----------------------- Appt Type:  CARDIOLOGY ANCILLARY VISIT      Date:  Wednesday, August 28, 2010      Time:  11:30 AM for 60 min   Urgency:  Routine   Made By:  Pearson Grippe  To Visit:  [id number]    Reason:  echo dx  Appt Comments ------------- -- 08/27/10 14:21: ([facility]) CANCELED -- echo dx -- 08/20/10 9:54: ([facility]) BOOKED -- Routine CARDIOLOGY ANCILLARY VISIT at 08/28/2010 11:30 AM for 60 min echo dx

## 2010-12-31 NOTE — Miscellaneous (Signed)
Summary: Orders Update  Clinical Lists Changes  Problems: Added new problem of PERIPHERAL NEUROPATHY (ICD-356.9) Orders: Added new Referral order of Neurology Referral (Neuro) - Signed

## 2010-12-31 NOTE — Miscellaneous (Signed)
Summary: Orders Update  Clinical Lists Changes  Orders: Added new Test order of Carotid Duplex (Carotid Duplex) - Signed 

## 2010-12-31 NOTE — Procedures (Signed)
Summary: pacer check/appt time is 9:00am/hm    Allergies: No Known Drug Allergies  PPM Specifications Following MD:  Everardo Beals. Juanda Chance, MD     PPM Vendor:  [employer]     PPM Model Number:  [id number]     PPM Serial Number:  [id number] PPM DOI:  10/14/2007     PPM Implanting MD:  Everardo Beals. Juanda Chance, MD  Lead 1    Location: [facility]     DOI: 10/01/1995     Model #: [id number]     Serial #: [id number]     Status: active Lead 2    Location: [facility]     DOI: 09/13/1995     Model #: [id number]     Serial #: [id number]     Status: active   Indications:  Sick Sinus Syndrome,PAF   PPM Follow Up Battery Voltage:  2.80 V     Battery Est. Longevity:  8.50 - >67yrs     Pacer Dependent:  No       PPM Device Measurements Atrium  Amplitude: 0.8 mV, Impedance: 334 ohms,  Right Ventricle  Amplitude: 12.0 mV, Impedance: 453 ohms,   Episodes MS Episodes:  0     Percent Mode Switch:  0%     Coumadin:  Yes Ventricular High Rate:  0     Atrial Pacing:  72%     Ventricular Pacing:  <1%  Parameters Mode:  DDDR     Lower Rate Limit:  60     Upper Rate Limit:  90 Paced AV Delay:  300     Sensed AV Delay:  275 Tech Comments:  PT NOT FEELING WELL SINCE LAST CHECK.  PT HAVING TROUBLE SLEEPING AND WAKING UP W/PALPS.  PER BRIAN MAGNET PLACEMENT TRIGGER TURNED ON SO PACER WILL RECORD WHEN PT IS HAVING AN EPISODE.  PT TO CALL WHEN HE HAS HAD 4-5 EPISODES.  Vella Kohler  Apr 23, 2010 9:33 AM

## 2010-12-31 NOTE — Progress Notes (Signed)
Summary: come off coumadin prior to colonscopy   Phone Note From Other Clinic   Caller: [facility] [phone]/ fax [phone] Request: Talk with Nurse Details for Reason: pt need to come off coumadin prior to colonscopy  on 8/25.  Initial call taken by: Lorne Skeens,  July 05, 2010 8:21 AM  Follow-up for Phone Call        called and spoke with Rene Kocher at [facility].  Pt is sch to have colonoscopy  07/25/10.  Hx of TIA and CVA in pt's hx.  Dr Juanda Chance had given ok to hold Plavix but pt is not on Plavix he is on Coumadin per our records.  Rene Kocher says it is ok if we can not hold Coumdain they will just go in and look to make pt feel better.  I let her know Dr Juanda Chance not here all next week.  He will be back on 07/16/10.  Will foward to [facility] and Dr Juanda Chance for review.  She is aware they will get back with her with decission Dennis Bast, RN, BSN  July 05, 2010 9:41 AM  Per Dr. Juanda Chance, the pt may hold coumadin for 5 days prior to procedure if needed. Follow-up by: Sherri Rad, RN, BSN,  July 09, 2010 9:00 AM  Additional Follow-up for Phone Call Additional follow up Details #1::        Note faxed to [facility]. Additional Follow-up by: Sherri Rad, RN, BSN,  July 09, 2010 9:01 AM

## 2010-12-31 NOTE — Letter (Signed)
Summary: [facility] Office Visit Note   [facility] Office Visit Note   Imported By: Roderic Ovens 08/20/2010 15:44:52  _____________________________________________________________________  External Attachment:    Type:   Image     Comment:   External Document

## 2010-12-31 NOTE — Progress Notes (Signed)
Summary: lab results  Phone Note Call from Patient Call back at Home Phone [phone]   Caller: Patient Reason for Call: Talk to Nurse, Lab or Test Results Summary of Call: blood work results Initial call taken by: Roe Coombs,  November 05, 2010 8:38 AM  Follow-up for Phone Call        I spoke with the pt. He wanted his TSH results. I explained he did not come and have those done yet. He is coming for a coumadin check today. We will check his TSH today. He is agreeable.  Follow-up by: Sherri Rad, RN, BSN,  November 05, 2010 10:38 AM

## 2010-12-31 NOTE — Progress Notes (Signed)
Summary: CT done?    Phone Note Outgoing Call   Call placed by: Sherri Rad, RN, BSN,  August 09, 2010 4:04 PM Call placed to: Patient Summary of Call: I left a message for the pt to call. I was trying to find out if the pt. has had his CT scan done that Dr. Sandria Manly was going to order. I do not see anything in e-chart at this point. Dr. Juanda Chance wanted to see these results prior to changing anything cardiac wise. Sherri Rad, RN, BSN  August 09, 2010 4:06 PM   I left a message for the pt to call to f/u on syptoms and to see if he has had a CT scan done. Sherri Rad, RN, BSN  August 16, 2010 10:35 AM   Pt coming in today for an office visit. Initial call taken by: Sherri Rad, RN, BSN,  August 20, 2010 8:10 AM

## 2010-12-31 NOTE — Miscellaneous (Signed)
Summary: dx correction  Clinical Lists Changes  Problems: Changed problem from PACEMAKER (ICD-V45..01) to PACEMAKER, PERMANENT (ICD-V45.01)  changed the incorrect dx code to correct dx code 

## 2010-12-31 NOTE — Medication Information (Signed)
Summary: rov/ln  Anticoagulant Therapy  Managed by: Weston Brass, PharmD Referring MD: Charlies Constable MD PCP: dr Theressa Millard MD: Shirlee Latch MD, [place] Indication 1: Atrial Fibrillation (ICD-427.31) Indication 2: Hyperlipidemia (ICD-Incr L) Lab Used: [facility] Site: [facility] INR POC 2.9 INR RANGE 2 - 3  Dietary changes: no    Health status changes: no    Bleeding/hemorrhagic complications: no    Recent/future hospitalizations: yes       Details: Pt has colonoscopy scheduled for 8/25.  Taking last dose of Coumadin on 8/19.  Any changes in medication regimen? no    Recent/future dental: no  Any missed doses?: no       Is patient compliant with meds? yes      Comments: Per Dr. Juanda Chance, pt will need Lovenox bridging.  SCr- 1.1  weight- 71.3 kg.  Will dose at 1.5mg /kg/day.  Pt will come into office for Lovenox teaching.   Allergies: No Known Drug Allergies  Anticoagulation Management History:      The patient is taking warfarin and comes in today for a routine follow up visit.  Positive risk factors for bleeding include an age of 12 years or older.  The bleeding index is 'intermediate risk'.  Positive CHADS2 values include History of HTN and Age > 61 years old.  The start date was 03/12/1998.  His last INR was 2.6 RATIO.  Anticoagulation responsible provider: Shirlee Latch MD, [place].  INR POC: 2.9.  Cuvette Lot#: [id number].  Exp: 08/2011.    Anticoagulation Management Assessment/Plan:      The patient's current anticoagulation dose is Coumadin 5 mg  tabs: Take as directed by coumadin clinic..  The target INR is 2 - 3.  The next INR is due 07/30/2010.  Anticoagulation instructions were given to patient.  Results were reviewed/authorized by Weston Brass, PharmD.  He was notified by Gweneth Fritter, PharmD Candidate.         Prior Anticoagulation Instructions: INR 2.1  Continue same regimen of 1/2 tab daily except for 1 tab on Monday, Wednesday, and Friday.  Re-check INR in 4  weeks.  Current Anticoagulation Instructions: INR 2.9  Continue taking 1/2 tablet (2.5mg ) every day except take 1 tablet (5mg ) on Mondays, Wednesdays, and Friday.  Take your last dose of Coumadin on Friday.  Start Lovenox on Monday.  Take Lovenox 120mg  once daily on Monday, Tuesday, and Wednesday.  Procedure on Thursday.  Restart Coumadin and Lovenox when okay with MD.  Take 1 1/2 tablets of Coumadin x 2 days then resume normal dose of 1/2 tablet every day except 1 tablet on Monday, Wednesday and Friday.  Recheck INR on 8/30.   Prescriptions: ENOXAPARIN SODIUM 120 MG/0.8ML SOLN (ENOXAPARIN SODIUM) Inject 1 syringe Subcutaneously once daily as directed  #10 syringes x 0   Entered by:   Weston Brass PharmD   Authorized by:   Lenoria Farrier, MD, [facility]   Signed by:   Weston Brass PharmD on 07/16/2010   Method used:   Electronically to        [facility] ([facility]ail[facility]       [address]       [place], [place]  [address]       Ph: [phone]       Fax: [phone]   RxID:   [id number]   Appended Document: rov/ln Spoke with pt.  He does not have prescription Rx coverage and Lovenox was going to cost  ~$1000.  Discussed with Dr. Juanda Chance.  He is okay with just bridging pt prior to procedure and starting Coumadin back as soon as possible after colonoscopy.  Will try to give patient samples to bridge prior to procedure.

## 2010-12-31 NOTE — Cardiovascular Report (Signed)
Summary: TTM   TTM   Imported By: Roderic Ovens 08/29/2010 16:10:02  _____________________________________________________________________  External Attachment:    Type:   Image     Comment:   External Document

## 2010-12-31 NOTE — Assessment & Plan Note (Signed)
Summary: device check/hr is 68      Allergies Added: NKDA  Current Medications (verified): 1)  Pacerone 100 Mg  Tabs (Amiodarone Hcl) .Marland Kitchen.. 1po Once Daily 2)  Coumadin 5 Mg  Tabs (Warfarin Sodium) .... Take As Directed By Coumadin Clinic. 3)  Aspirin Adult Low Strength 81 Mg Tbec (Aspirin) .... Take 1 Tablet By Mouth Once A Day 4)  Synthroid 25 Mcg  Tabs (Levothyroxine Sodium) .Marland Kitchen.. 1 By Mouth Qd 5)  Diltiazem Hcl Er Beads 120 Mg Xr24h-Cap (Diltiazem Hcl Er Beads) .... Take One Capsule By Mouth Daily 6)  Zocor 20 Mg Tabs (Simvastatin) .... Take 1 Tablet By Mouth Once A Day 7)  Docusate Sodium 100 Mg Caps (Docusate Sodium) .... Take 1 Tablet By Mouth Once A Day 8)  Multivitamins  Tabs (Multiple Vitamin) .... Take 1 Tablet By Mouth Once A Day 9)  Zolpidem Tartrate 10 Mg Tabs (Zolpidem Tartrate) .... 1/2 By Mouth At Bedtime As Needed 10)  Ativan 1 Mg Tabs (Lorazepam) .... Half To 1 By Mouth Once Daily As Needed 11)  Enoxaparin Sodium 120 Mg/0.63ml Soln (Enoxaparin Sodium) .... Inject 1 Syringe Subcutaneously Once Daily As Directed  Allergies (verified): No Known Drug Allergies   PPM Specifications Following MD:  Everardo Beals. Juanda Chance, MD     PPM Vendor:  [employer] Jude     PPM Model Number:  [id number]     PPM Serial Number:  [id number] PPM DOI:  10/14/2007     PPM Implanting MD:  Everardo Beals. Juanda Chance, MD  Lead 1    Location: [facility]     DOI: 10/01/1995     Model #: [id number]     Serial #: [id number]     Status: active Lead 2    Location: [facility]     DOI: 09/13/1995     Model #: [id number]     Serial #: [id number]     Status: active   Indications:  Sick Sinus Syndrome,PAF   PPM Follow Up Battery Voltage:  2.79 V     Battery Est. Longevity:  4-6.25 yrs     Pacer Dependent:  No       PPM Device Measurements Atrium  Amplitude: 0.9 mV, Impedance: 377 ohms, Threshold: 1.25 V at 0.4 msec Right Ventricle  Amplitude: 12.0 mV, Impedance: 485 ohms, Threshold: 1.00 V at 0.4 msec  Episodes MS Episodes:  0     Coumadin:  Yes Ventricular High  Rate:  0     Atrial Pacing:  75%     Ventricular Pacing:  <1%  Parameters Mode:  DDDR     Lower Rate Limit:  60     Upper Rate Limit:  90 Paced AV Delay:  300     Sensed AV Delay:  275 Tech Comments:  PT CALLED AND WAS UPSET THAT HR WAS 68bpm.  PT WAS SCHEDULED FOR DEVICE CHECK W/BRIAN SMALL.  NORMAL DEVICE FUNCTION.  NO CHANGES MADE.  PT REASSURED BY BRIAN FROM [facility] THAT PACER WAS FUNCTIONING NORMALLY. PT TO BE SCHEDULED W/DR Daulton Harbaugh. Vella Kohler  July 18, 2010 4:17 PM

## 2010-12-31 NOTE — Miscellaneous (Signed)
Summary: Orders Update  Clinical Lists Changes 

## 2010-12-31 NOTE — Assessment & Plan Note (Signed)
Summary: f48m  Medications Added HYDROCHLOROTHIAZIDE 25 MG TABS (HYDROCHLOROTHIAZIDE) Take one tablet by mouth daily. as needed LORAZEPAM 0.5 MG TABS (LORAZEPAM) take one tab by mouth once daily as needed for anxiety.        Visit Type:  6 months follow up Primary Provider:  dr Susa Simmonds  CC:  No complaints.  History of Present Illness: Mr. [name] is 77 years old and return for followup management of his pacemaker and atrial fibrillation. He has paroxysmal atrial fibrillation which is managed with low-dose amiodarone and Coumadin. He also has a DDD pacemaker. He has had no recent symptoms of chest pain or shortness of breath. He does say he is more aware of his heartbeat when he lies on his right side.  When he was here last time he was having some lower extremity edema which I thought was related to venous insufficiency. He was treated with hydrochlorothiazide and support hose with improvement. Laboratory studies showed an elevated TSH. This was borderline at 5.79. We did an echocardiogram which showed an ejection fraction of 60% and we did venous Dopplers which were negative for DVT.  He has done well since that time.  His other problems include hypertension, hyperlipidemia, and history of TIA.  Current Medications (verified): 1)  Pacerone 100 Mg  Tabs (Amiodarone Hcl) .Marland Kitchen.. 1po Once Daily 2)  Coumadin 5 Mg  Tabs (Warfarin Sodium) .... Take As Directed By Coumadin Clinic. 3)  Aspirin Adult Low Strength 81 Mg Tbec (Aspirin) .... Take 1 Tablet By Mouth Once A Day 4)  Synthroid 25 Mcg  Tabs (Levothyroxine Sodium) .Marland Kitchen.. 1 By Mouth Qd 5)  Diltiazem Hcl Er Beads 120 Mg Xr24h-Cap (Diltiazem Hcl Er Beads) .... Take One Capsule By Mouth Daily 6)  Zocor 20 Mg Tabs (Simvastatin) .... Take 1 Tablet By Mouth Once A Day 7)  Docusate Sodium 100 Mg Caps (Docusate Sodium) .... Take 1 Tablet By Mouth Once A Day 8)  Multivitamins  Tabs (Multiple Vitamin) .... Take 1 Tablet By Mouth Once A Day 9)  Zolpidem  Tartrate 10 Mg Tabs (Zolpidem Tartrate) .... 1/2 By Mouth At Bedtime As Needed 10)  Ativan 1 Mg Tabs (Lorazepam) .... Half To 1 By Mouth Once Daily As Needed 11)  Enoxaparin Sodium 120 Mg/0.33ml Soln (Enoxaparin Sodium) .... Inject 1 Syringe Subcutaneously Once Daily As Directed 12)  Hydrochlorothiazide 25 Mg Tabs (Hydrochlorothiazide) .... Take One Tablet By Mouth Daily. As Needed  Allergies: No Known Drug Allergies  Past History:  Past Medical History: Reviewed history from 07/26/2009 and no changes required. BRADYCARDIA (ICD-427.89) PREMATURE VENTRICULAR CONTRACTIONS (ICD-427.69) ATAXIA (ICD-781.3) THYROID FUNCTION TEST, ABNORMAL (ICD-794.5) URINARY URGENCY (ICD-788.63) SPECIAL SCREENING MALIG NEOPLASMS OTHER SITES (ICD-V76.49) BENIGN PROSTATIC HYPERTROPHY (ICD-600.00) FATIGUE (ICD-780.79) INSOMNIA-SLEEP DISORDER-UNSPEC (ICD-780.52) ANXIETY (ICD-300.00) PERIPHERAL VASCULAR DISEASE (ICD-443.9) ALLERGIC RHINITIS (ICD-477.9) HYPOTHYROIDISM (ICD-244.9) 1. Palpitations with documented premature ventricular contractions. 2. Paroxysmal atrial fibrillation, controlled with amiodarone. 3. Status post dual-mode, dual-pacing, dual-sensing pacemaker with     generator change with a [employer] Zephyr generator, August 2009. 4. Hypertension. 5. Hyperlipidemia. 6. History of transient ischemic attack.  HYPERTENSION, BENIGN ESSENTIAL (ICD-401.1) ENCOUNTER FOR LONG-TERM USE OF OTHER MEDICATIONS (ICD-V58.69) GROSS HEMATURIA (ICD-599.71) CEREBROVASCULAR ACCIDENT, HX OF (ICD-V12.50) ANTICOAGULATION THERAPY (ICD-V58.61) ATRIAL FIBRILLATION (ICD-427.31) SICK SINUS SYNDROME (ICD-427.81) CAROTID ARTERY STENOSIS (ICD-433.10) TRANSIENT ISCHEMIC ATTACK, HX OF (ICD-V12.50) HYPERLIPIDEMIA (ICD-272.4)    Review of Systems       ROS is negative except as outlined in HPI.   Vital Signs:  Patient profile:   77  year old male Height:      69 inches Weight:      158 pounds BMI:     23.42 Pulse  rate:   60 / minute Pulse rhythm:   regular Resp:     18 per minute BP sitting:   138 / 77  (left arm) Cuff size:   large  Vitals Entered By: Vikki Ports (October 29, 2010 3:52 PM)  Physical Exam  Additional Exam:  Gen. Well-nourished, in no distress   Neck: No JVD, thyroid not enlarged, no carotid bruits Lungs: No tachypnea, clear without rales, rhonchi or wheezes Cardiovascular: Rhythm regular, PMI not displaced,  heart sounds  normal, no murmurs or gallops, no peripheral edema, pulses normal in all 4 extremities. Abdomen: BS normal, abdomen soft and non-tender without masses or organomegaly, no hepatosplenomegaly. MS: No deformities, no cyanosis or clubbing   Neuro:  No focal sns   Skin:  no lesions    PPM Specifications Following MD:  Everardo Beals. Juanda Chance, MD     PPM Vendor:  [employer] Jude     PPM Model Number:  [id number]     PPM Serial Number:  [id number] PPM DOI:  10/14/2007     PPM Implanting MD:  Everardo Beals. Juanda Chance, MD  Lead 1    Location: RA     DOI: 10/01/1995     Model #: [id number]     Serial #: [id number]     Status: active Lead 2    Location: RV     DOI: 09/13/1995     Model #: [id number]     Serial #: [id number]     Status: active   Indications:  Sick Sinus Syndrome,PAF   PPM Follow Up Battery Voltage:  2.81 V     Battery Est. Longevity:  6.25-24yrs     Pacer Dependent:  No       PPM Device Measurements Atrium  Amplitude: 1.1 mV, Impedance: 326 ohms, Threshold: 0.75 V at 0.4 msec Right Ventricle  Amplitude: 12.0 mV, Impedance: 466 ohms, Threshold: 1.00 V at 0.4 msec  Episodes MS Episodes:  0     Coumadin:  Yes Ventricular High Rate:  0      Parameters Mode:  DDDR     Lower Rate Limit:  60     Upper Rate Limit:  90 Paced AV Delay:  300     Sensed AV Delay:  275 Next Cardiology Appt Due:  05/02/2011 Tech Comments:  NORMAL DEVICE FUNCTION.  NO EPISODES OR MODE SWITCHES.  NO CHANGES MADE. ROV IN 6 MTHS W/DEVICE CLINIC. Vella Kohler  October 29, 2010 4:58 PM  Impression &  Recommendations:  Problem # 1:  ATRIAL FIBRILLATION (ICD-427.31) He has a history of paroxysmal atrial fibrillation. He's had no recent symptoms for this. We interrogated his pacemaker and he has had no mode switches. We will continue therapy with low-dose amiodarone and Coumadin. We did amiodarone surveillance laboratory studies 3 months ago. His TSH was borderline elevated so we'll repeat that today. He requested some Ativan for p.r.n. use when he gets anxious and feels like a defibrillation may be imminent.  He does have a history of TIA so we have bridged him when he has had to interrupt his Coumadin for procedures or surgery.  We will have him followup with Dr. Johney Frame  in 6 months.  His updated medication list for this problem includes:    Pacerone 100 Mg Tabs (Amiodarone hcl) .Marland Kitchen... 1po once daily    Coumadin  5 Mg Tabs (Warfarin sodium) .Marland Kitchen... Take as directed by coumadin clinic.    Aspirin Adult Low Strength 81 Mg Tbec (Aspirin) .Marland Kitchen... Take 1 tablet by mouth once a day  Orders: EKG w/ Interpretation (93000)  Problem # 2:  PACEMAKER, PERMANENT (ICD-V45.01) He has a DDD pacemaker. We interrogated this today. He is pacing the atrium the majority of the time but has very little pacing in the ventricle. He has had no mode switches.  Problem # 3:  HYPERTENSION, BENIGN ESSENTIAL (ICD-401.1) This is well controlled on current medications. His updated medication list for this problem includes:    Aspirin Adult Low Strength 81 Mg Tbec (Aspirin) .Marland Kitchen... Take 1 tablet by mouth once a day    Diltiazem Hcl Er Beads 120 Mg Xr24h-cap (Diltiazem hcl er beads) .Marland Kitchen... Take one capsule by mouth daily    Hydrochlorothiazide 25 Mg Tabs (Hydrochlorothiazide) .Marland Kitchen... Take one tablet by mouth daily. as needed  Patient Instructions: 1)  Labwork tomorrow: TSH (427.31;402.10). 2)  Start Lorazepam (Ativan) 0.5mg  take one tab by mouth once daily as needed for anxiety. 3)  Your physician wants you to follow-up in: 6  months with Dr. Johney Frame.  You will receive a reminder letter in the mail two months in advance. If you don't receive a letter, please call our office to schedule the follow-up appointment. Prescriptions: LORAZEPAM 0.5 MG TABS (LORAZEPAM) take one tab by mouth once daily as needed for anxiety.  #30 x 0   Entered by:   Sherri Rad, RN, BSN   Authorized by:   Lenoria Farrier, MD, [facility]   Signed by:   Sherri Rad, RN, BSN on 10/29/2010   Method used:   Print then Give to Patient   RxID:   [id number]

## 2010-12-31 NOTE — Medication Information (Signed)
Summary: rov/ez  Anticoagulant Therapy  Managed by: Cloyde Reams, RN, BSN Referring MD: Charlies Constable MD PCP: dr Theressa Millard MD: Myrtis Ser MD, Tinnie Gens Indication 1: Atrial Fibrillation (ICD-427.31) Indication 2: Hyperlipidemia (ICD-Incr L) Lab Used: [facility] Site: [facility] INR POC 2.0 INR RANGE 2 - 3  Dietary changes: no    Health status changes: no    Bleeding/hemorrhagic complications: no    Recent/future hospitalizations: no    Any changes in medication regimen? no    Recent/future dental: no  Any missed doses?: no       Is patient compliant with meds? yes       Allergies (verified): No Known Drug Allergies  Anticoagulation Management History:      The patient is taking warfarin and comes in today for a routine follow up visit.  Positive risk factors for bleeding include an age of 75 years or older.  The bleeding index is 'intermediate risk'.  Positive CHADS2 values include History of HTN and Age > 61 years old.  The start date was 03/12/1998.  His last INR was 2.6 RATIO.  Anticoagulation responsible provider: Myrtis Ser MD, Tinnie Gens.  INR POC: 2.0.  Cuvette Lot#: [id number].  Exp: 03/2011.    Anticoagulation Management Assessment/Plan:      The patient's current anticoagulation dose is Coumadin 5 mg  tabs: Take as directed by coumadin clinic..  The target INR is 2 - 3.  The next INR is due 02/14/2010.  Anticoagulation instructions were given to patient.  Results were reviewed/authorized by Cloyde Reams, RN, BSN.  He was notified by Cloyde Reams RN.         Prior Anticoagulation Instructions: INR: 2.8 continue with same dosage 2.5mg  daily except 5mg  on Mondays, Wednesdays and Fridays Recheck 4 weeks  Current Anticoagulation Instructions: INR 2.0  Take 5mg  today then resume same dosage2.5mg  daily except 5mg  on Mondays, Wednesdays, and Fridays.  Recheck in 4 weeks.

## 2010-12-31 NOTE — Progress Notes (Signed)
Summary: appt on tuesday to set pace maker    Phone Note Call from Patient Call back at [facility] Phone [phone]   Caller: Patient Reason for Call: Talk to Nurse Summary of Call: pt need appt with  brian smalls. on tuesday to set his pace maker. Initial call taken by: Lorne Skeens,  Apr 19, 2010 9:34 AM  Follow-up for Phone Call        I spoke with the pt. He states he did not have a good day the past few days. He wants to see Arlys John next week to have a slight adjustment made. I explained to the pt I will call him back on monday to give him a time for this. He is agreeable. Follow-up by: Sherri Rad, RN, BSN,  Apr 19, 2010 11:13 AM  Additional Follow-up for Phone Call Additional follow up Details #1::        Per Joice Lofts, Arlys John will see the pt tomorrow morning at 9am. The pt is aware and agreeable. Additional Follow-up by: Sherri Rad, RN, BSN,  Apr 22, 2010 2:27 PM

## 2010-12-31 NOTE — Medication Information (Signed)
Summary: rov/cb  Anticoagulant Therapy  Managed by: Weston Brass, PharmD Referring MD: Charlies Constable MD PCP: dr Theressa Millard MD: Riley Kill MD, Maisie Fus Indication 1: Atrial Fibrillation (ICD-427.31) Indication 2: Hyperlipidemia (ICD-Incr L) Lab Used: [facility] Site: [facility] INR POC 2.1 INR RANGE 2 - 3  Dietary changes: no    Health status changes: no    Bleeding/hemorrhagic complications: no    Recent/future hospitalizations: no    Any changes in medication regimen? no    Recent/future dental: no  Any missed doses?: no       Is patient compliant with meds? yes      Comments: pt will be out of town on 8/12, so scheduled appt for 8/15  Allergies: No Known Drug Allergies  Anticoagulation Management History:      The patient is taking warfarin and comes in today for a routine follow up visit.  Positive risk factors for bleeding include an age of 75 years or older.  The bleeding index is 'intermediate risk'.  Positive CHADS2 values include History of HTN and Age > 18 years old.  The start date was 03/12/1998.  His last INR was 2.6 RATIO.  Anticoagulation responsible provider: Riley Kill MD, Maisie Fus.  INR POC: 2.1.  Cuvette Lot#: [id number].  Exp: 08/2011.    Anticoagulation Management Assessment/Plan:      The patient's current anticoagulation dose is Coumadin 5 mg  tabs: Take as directed by coumadin clinic..  The target INR is 2 - 3.  The next INR is due 07/15/2010.  Anticoagulation instructions were given to patient.  Results were reviewed/authorized by Weston Brass, PharmD.  He was notified by Dillard Cannon.         Prior Anticoagulation Instructions: INR 2.7. Take 0.5 tablet daily except 1 tablet on Mon, Wed, Fri.  Recheck in 4 weeks.  Current Anticoagulation Instructions: INR 2.1  Continue same regimen of 1/2 tab daily except for 1 tab on Monday, Wednesday, and Friday.  Re-check INR in 4 weeks.

## 2010-12-31 NOTE — Medication Information (Signed)
Summary: rov/sp  Anticoagulant Therapy  Managed by: Bethena Midget, RN, BSN Referring MD: Charlies Constable MD PCP: dr Theressa Millard MD: Graciela Husbands MD, Viviann Spare Indication 1: Atrial Fibrillation (ICD-427.31) Indication 2: Hyperlipidemia (ICD-Incr L) Lab Used: [facility] Site: [facility] INR POC 2.4 INR RANGE 2 - 3  Dietary changes: no    Health status changes: no    Bleeding/hemorrhagic complications: no    Recent/future hospitalizations: no    Any changes in medication regimen? no    Recent/future dental: no  Any missed doses?: no       Is patient compliant with meds? yes       Allergies: No Known Drug Allergies  Anticoagulation Management History:      The patient is taking warfarin and comes in today for a routine follow up visit.  Positive risk factors for bleeding include an age of 75 years or older.  The bleeding index is 'intermediate risk'.  Positive CHADS2 values include History of HTN and Age > 75 years old.  The start date was 03/12/1998.  His last INR was 2.6 RATIO.  Anticoagulation responsible provider: Graciela Husbands MD, Viviann Spare.  INR POC: 2.4.  Cuvette Lot#: [id number].  Exp: 04/2011.    Anticoagulation Management Assessment/Plan:      The patient's current anticoagulation dose is Coumadin 5 mg  tabs: Take as directed by coumadin clinic..  The target INR is 2 - 3.  The next INR is due 04/15/2010.  Anticoagulation instructions were given to patient.  Results were reviewed/authorized by Bethena Midget, RN, BSN.  He was notified by Bethena Midget, RN, BSN.         Prior Anticoagulation Instructions: INR 2.3  Continue on same dosage 1/2 tablet daily except 1 tablet on Mondays, Wednesdays, and Fridays.  Recheck in 4 weeks.    Current Anticoagulation Instructions: INR 2.4 Continue 2.5mg s daily except 5mg s on Mondays, Wednesdays and Fridays. Recheck in 4 weeks.

## 2010-12-31 NOTE — Medication Information (Signed)
Summary: rov/ewj  Anticoagulant Therapy  Managed by: Shelby Dubin, PharmD, BCPS, CPP Referring MD: Charlies Constable MD PCP: dr Theressa Millard MD: Jens Som MD, Arlys John Indication 1: Atrial Fibrillation (ICD-427.31) Indication 2: Hyperlipidemia (ICD-Incr L) Lab Used: [facility] Site: [facility] INR POC 2.8 INR RANGE 2 - 3  Dietary changes: no    Health status changes: no    Bleeding/hemorrhagic complications: no    Recent/future hospitalizations: no    Any changes in medication regimen? no    Recent/future dental: no  Any missed doses?: no       Is patient compliant with meds? yes       Allergies: No Known Drug Allergies  Anticoagulation Management History:      The patient is taking warfarin and comes in today for a routine follow up visit.  Positive risk factors for bleeding include an age of 75 years or older.  The bleeding index is 'intermediate risk'.  Positive CHADS2 values include History of HTN and Age > 91 years old.  The start date was 03/12/1998.  His last INR was 2.6 RATIO.  Anticoagulation responsible provider: Jens Som MD, Arlys John.  INR POC: 2.8.  Cuvette Lot#: [id number].  Exp: 03/2011.    Anticoagulation Management Assessment/Plan:      The patient's current anticoagulation dose is Coumadin 5 mg  tabs: Take as directed by coumadin clinic..  The target INR is 2 - 3.  The next INR is due 01/17/2010.  Anticoagulation instructions were given to patient.  Results were reviewed/authorized by Shelby Dubin, PharmD, BCPS, CPP.  He was notified by Ysidro Evert, Pharm D Candidate.         Prior Anticoagulation Instructions: INR 2.5  Continue on same dosage 1/2 tablet daily except 1 tablet on Mondays, Wednesdays, and Fridays.  Recheck in 4 weeks.    Current Anticoagulation Instructions: INR: 2.8 continue with same dosage 2.5mg  daily except 5mg  on Mondays, Wednesdays and Fridays Recheck 4 weeks

## 2010-12-31 NOTE — Progress Notes (Signed)
Summary: pt rtn your call   Phone Note Call from Patient Call back at Home Phone [phone]   Reason for Call: Talk to Nurse, Talk to Doctor, Lab or Test Results Summary of Call: rtn your call to get echo results Initial call taken by: Omer Jack,  September 17, 2010 8:15 AM  Follow-up for Phone Call        I spoke with the pt.  Follow-up by: Sherri Rad, RN, BSN,  September 17, 2010 11:06 AM

## 2010-12-31 NOTE — Medication Information (Signed)
Summary: rov/tm   Anticoagulant Therapy  Managed by: Lyna Poser, PharmD Referring MD: Charlies Constable MD PCP: dr Theressa Millard MD: Lulie Hurd MD,Chet Greenley Indication 1: Atrial Fibrillation (ICD-427.31) Indication 2: Hyperlipidemia (ICD-Incr L) Lab Used: [facility] Site: [address] INR POC 2.1 INR RANGE 2 - 3  Dietary changes: no    Health status changes: no    Bleeding/hemorrhagic complications: no    Recent/future hospitalizations: no    Any changes in medication regimen? no    Recent/future dental: no  Any missed doses?: no       Is patient compliant with meds? yes       Allergies: No Known Drug Allergies  Anticoagulation Management History:      The patient is taking warfarin and comes in today for a routine follow up visit.  Positive risk factors for bleeding include an age of 75 years or older.  The bleeding index is 'intermediate risk'.  Positive CHADS2 values include History of HTN and Age > 75 years old.  The start date was 03/12/1998.  His last INR was 2.6 RATIO.  Anticoagulation responsible provider: Fronia Depass MD,Etoile Looman.  INR POC: 2.1.  Cuvette Lot#: [id number].  Exp: 10/2011.    Anticoagulation Management Assessment/Plan:      The patient's current anticoagulation dose is Coumadin 5 mg  tabs: Take as directed by coumadin clinic..  The target INR is 2 - 3.  The next INR is due 11/05/2010.  Anticoagulation instructions were given to patient.  Results were reviewed/authorized by Lyna Poser, PharmD.         Prior Anticoagulation Instructions: INR 2.1 Continue 2.5mg s daily excpet 5mg s on Mondays, Wednesdays and Fridays. Recheck in 4 weeks.   Current Anticoagulation Instructions: INR 2.1 Continue 2.5 mg daily except 5 mg on monday, wednesday, and friday. Recheck in 4 weeks.

## 2010-12-31 NOTE — Miscellaneous (Signed)
Summary: Appointment Canceled  Appointment status changed to canceled by [employer] on 08/20/2010 9:54 AM.  Cancellation Comments --------------------- echo dx  Appointment Information ----------------------- Appt Type:  CARDIOLOGY ANCILLARY VISIT      Date:  Friday, August 23, 2010      Time:  1:00 PM for 60 min   Urgency:  Routine   Made By:  Pearson Grippe  To Visit:  [id number]    Reason:  echo dx  Appt Comments ------------- -- 08/20/10 9:54: ([facility]) CANCELED -- echo dx -- 08/20/10 9:10: ([facility]) BOOKED -- Routine CARDIOLOGY ANCILLARY VISIT at 08/23/2010 1:00 PM for 60 min echo dx

## 2010-12-31 NOTE — Miscellaneous (Signed)
Summary: Appointment Canceled  Appointment status changed to canceled by [employer] on 08/27/2010 3:05 PM.  Cancellation Comments --------------------- echo dx  Appointment Information ----------------------- Appt Type:  CARDIOLOGY ANCILLARY VISIT      Date:  Thursday, August 29, 2010      Time:  10:30 AM for 60 min   Urgency:  Routine   Made By:  Pearson Grippe  To Visit:  [id number]    Reason:  echo dx  Appt Comments ------------- -- 08/27/10 15:05: ([facility]) CANCELED -- echo dx -- 08/27/10 14:21: ([facility]) BOOKED -- Routine CARDIOLOGY ANCILLARY VISIT at 08/29/2010 10:30 AM for 60 min echo dx

## 2010-12-31 NOTE — Progress Notes (Signed)
Summary: questions re med/colonoscopy    Phone Note Call from Patient   Caller: Patient Reason for Call: Talk to Nurse Summary of Call: pt wants to talk with heather-to get inj for a colonoscopy-saw eye dr today has an eye infection will be on abx-pls call [phone] Initial call taken by: Glynda Jaeger,  July 17, 2010 2:51 PM  Follow-up for Phone Call        [facility]. Sherri Rad, RN, BSN  July 17, 2010 3:04 PM  Pt returning call Judie Grieve  July 17, 2010 3:25 PM pt calling wanting to speak to dr Juanda Chance before he leaves today [phone]  Dr. Juanda Chance spoke with the pt. The pt states he feels like he has had some fast HR's. He is scheduled to come in tomorrow for a device check. He also states he has cancelled his colonoscopy due to an eye infection and generally not feeling well. He will r/s this for sometime in October. He would also like Dr. Juanda Chance to send another referral to Dr. Sandria Manly at [facility] so he can go back to see him for neuropathy. We will send this. I will let [facility] know the pt has cancelled his colonscopy.   Follow-up by: Sherri Rad, RN, BSN,  July 17, 2010 5:45 PM     Appended Document: questions re med/colonoscopy  Spoke with pt.  He is aware to continue his Coumadin on his current dose and we rescheduled his appt for after his vacation.

## 2010-12-31 NOTE — Medication Information (Signed)
Summary: rov/mw  Anticoagulant Therapy  Managed by: Bethena Midget, RN, BSN Referring MD: Charlies Constable MD PCP: dr Theressa Millard MD: Eden Emms MD, Theron Arista Indication 1: Atrial Fibrillation (ICD-427.31) Indication 2: Hyperlipidemia (ICD-Incr L) Lab Used: [facility] Site: [address] INR POC 2.7 INR RANGE 2 - 3  Dietary changes: no    Health status changes: no    Bleeding/hemorrhagic complications: no    Recent/future hospitalizations: no    Any changes in medication regimen? no    Recent/future dental: no  Any missed doses?: no       Is patient compliant with meds? yes       Allergies: No Known Drug Allergies  Anticoagulation Management History:      The patient is taking warfarin and comes in today for a routine follow up visit.  Positive risk factors for bleeding include an age of 75 years or older.  The bleeding index is 'intermediate risk'.  Positive CHADS2 values include History of HTN and Age > 75 years old.  The start date was 03/12/1998.  His last INR was 2.6 RATIO.  Anticoagulation responsible provider: Eden Emms MD, Theron Arista.  INR POC: 2.7.  Cuvette Lot#: [id number].  Exp: 09/2011.    Anticoagulation Management Assessment/Plan:      The patient's current anticoagulation dose is Coumadin 5 mg  tabs: Take as directed by coumadin clinic..  The target INR is 2 - 3.  The next INR is due 12/03/2010.  Anticoagulation instructions were given to patient.  Results were reviewed/authorized by Bethena Midget, RN, BSN.  He was notified by Bethena Midget, RN, BSN.         Prior Anticoagulation Instructions: INR 2.1 Continue 2.5 mg daily except 5 mg on monday, wednesday, and friday. Recheck in 4 weeks.   Current Anticoagulation Instructions: INR 2.7 Continue 1/2 pill everyday except 1 pill on Mondays, Wednesdays and Fridays. Recheck in 4 weeks.

## 2010-12-31 NOTE — Cardiovascular Report (Signed)
Summary: Office Visit   Office Visit   Imported By: Roderic Ovens 08/30/2010 12:47:33  _____________________________________________________________________  External Attachment:    Type:   Image     Comment:   External Document

## 2010-12-31 NOTE — Progress Notes (Signed)
Summary: off plavix prior to surgery    Phone Note From Pharmacy   Caller: regina office [phone] / fax # [phone] Request: Speak with Nurse Details of Request: pt need to be off plavix prior to procredure.  Initial call taken by: Lorne Skeens,  July 01, 2010 12:32 PM  Follow-up for Phone Call        PT NEEDS TO HOLD PLAVIX 5-7 DAYS  PRIOR TO COLONOSCOPY.  PLEASE ADVISE PER REGINA FAX NOTE WITH DIRECTIONS. Follow-up by: Scherrie Bateman, LPN,  July 01, 2010 12:38 PM  Additional Follow-up for Phone Call Additional follow up Details #1::        OK to hold 5-7 days per Dr. Juanda Chance. Sherri Rad, RN, BSN  July 02, 2010 6:00 PM

## 2010-12-31 NOTE — Progress Notes (Signed)
Summary: refill med   Phone Note Refill Request Call back at Home Phone [phone] Message from:  Patient on June 24, 2010 4:22 PM  Refills Requested: Medication #1:  COUMADIN 5 MG  TABS Take as directed by coumadin clinic. [facility]   Method Requested: Fax to Local Pharmacy Initial call taken by: Lorne Skeens,  June 24, 2010 4:22 PM  Follow-up for Phone Call        Called spoke with pt, advised refill requested sent electronically to pharmacy.  Follow-up by: Cloyde Reams RN,  June 26, 2010 11:13 AM    Prescriptions: COUMADIN 5 MG  TABS (WARFARIN SODIUM) Take as directed by coumadin clinic.  #30 x 3   Entered by:   Cloyde Reams RN   Authorized by:   Lenoria Farrier, MD, [facility]   Signed by:   Cloyde Reams RN on 06/26/2010   Method used:   Electronically to        [facility] (retail)       [address]       [place], [place]  [address]       Ph: [phone]       Fax: [phone]   RxID:   [id number]

## 2010-12-31 NOTE — Progress Notes (Signed)
Summary: [place]    Phone Note Call from Patient Call back at [facility] Phone [phone]   Caller: Patient Summary of Call: PT HAVE QUESTION ABOUT ABOUT GOING ON  A TRIP TO [place] WANT TO MAKE SURE IT'S OKAY TO GO UP THAT HIGH AS 14,500 Initial call taken by: Judie Grieve,  March 07, 2010 3:21 PM  Follow-up for Phone Call        I called the pt. He is planning a trip to the [facility] in a couple of months. [place] be [place] to [place] at [place] 14,500 feet. He wanted to make sure this was ok with Dr. Juanda Chance. I will review with Dr. Juanda Chance and let the pt know. Follow-up by: Sherri Rad, RN, BSN,  March 07, 2010 4:50 PM  Additional Follow-up for Phone Call Additional follow up Details #1::        He should try to adjust at a lower altitude ie [phone] before going higher.  There is some risk of precipitating AF at high altitudes.  If it is very important to him, probably OK to go.  If not that important to him, probably would not Additional Follow-up by: Lenoria Farrier, MD, [facility],  March 08, 2010 8:29 PM    Additional Follow-up for Phone Call Additional follow up Details #2::    The pt is aware of Dr. Regino Schultze recommendations. Follow-up by: Sherri Rad, RN, BSN,  March 12, 2010 10:31 AM

## 2010-12-31 NOTE — Cardiovascular Report (Signed)
Summary: Office Visit   Office Visit   Imported By: Roderic Ovens 06/14/2010 16:04:49  _____________________________________________________________________  External Attachment:    Type:   Image     Comment:   External Document

## 2010-12-31 NOTE — Assessment & Plan Note (Signed)
Summary: f61m      Allergies Added: NKDA Visit Type:  Follow-up Primary Provider:  dr Susa Simmonds  CC:  no complaints-pacer check today.  History of Present Illness: The patient is 77 years old and return for management of atrial fibrillation and his pacemaker. In 2000 he had a St. Jude pacemaker implant for sick sinus syndrome and atrial fibrillation. He had a generator change in 2008. He has had paroxysmal atrial fibrillation which has been managed with amiodarone and Coumadin. He also had PVCs but these finally resolved and he hasn't had any symptoms of palpitations recently. He has been feeling well with no chest pain or shortness of breath or palpitations.  His other problems include hypertension, hyperlipidemia, and history of TIA.    Current Medications (verified): 1)  Pacerone 100 Mg  Tabs (Amiodarone Hcl) .Marland Kitchen.. 1po Once Daily 2)  Coumadin 5 Mg  Tabs (Warfarin Sodium) .... Take As Directed By Coumadin Clinic. 3)  Aspirin Adult Low Strength 81 Mg Tbec (Aspirin) .... Take 1 Tablet By Mouth Once A Day 4)  Synthroid 25 Mcg  Tabs (Levothyroxine Sodium) .Marland Kitchen.. 1 By Mouth Qd 5)  Diltiazem Hcl Er Beads 120 Mg Xr24h-Cap (Diltiazem Hcl Er Beads) .... Take One Capsule By Mouth Daily 6)  Zocor 20 Mg Tabs (Simvastatin) .... Take 1 Tablet By Mouth Once A Day 7)  Docusate Sodium 100 Mg Caps (Docusate Sodium) .... Take 1 Tablet By Mouth Once A Day 8)  Multivitamins  Tabs (Multiple Vitamin) .... Take 1 Tablet By Mouth Once A Day 9)  Zolpidem Tartrate 10 Mg Tabs (Zolpidem Tartrate) .... 1/2 By Mouth At Bedtime As Needed 10)  Ativan 1 Mg Tabs (Lorazepam) .... Half To 1 By Mouth Once Daily As Needed  Allergies (verified): No Known Drug Allergies  Past History:  Past Medical History: Reviewed history from 07/26/2009 and no changes required. BRADYCARDIA (ICD-427.89) PREMATURE VENTRICULAR CONTRACTIONS (ICD-427.69) ATAXIA (ICD-781.3) THYROID FUNCTION TEST, ABNORMAL (ICD-794.5) URINARY URGENCY  (ICD-788.63) SPECIAL SCREENING MALIG NEOPLASMS OTHER SITES (ICD-V76.49) BENIGN PROSTATIC HYPERTROPHY (ICD-600.00) FATIGUE (ICD-780.79) INSOMNIA-SLEEP DISORDER-UNSPEC (ICD-780.52) ANXIETY (ICD-300.00) PERIPHERAL VASCULAR DISEASE (ICD-443.9) ALLERGIC RHINITIS (ICD-477.9) HYPOTHYROIDISM (ICD-244.9) 1. Palpitations with documented premature ventricular contractions. 2. Paroxysmal atrial fibrillation, controlled with amiodarone. 3. Status post dual-mode, dual-pacing, dual-sensing pacemaker with     generator change with a [employer]. Jude Zephyr generator, August 2009. 4. Hypertension. 5. Hyperlipidemia. 6. History of transient ischemic attack.  HYPERTENSION, BENIGN ESSENTIAL (ICD-401.1) ENCOUNTER FOR LONG-TERM USE OF OTHER MEDICATIONS (ICD-V58.69) GROSS HEMATURIA (ICD-599.71) CEREBROVASCULAR ACCIDENT, HX OF (ICD-V12.50) ANTICOAGULATION THERAPY (ICD-V58.61) ATRIAL FIBRILLATION (ICD-427.31) SICK SINUS SYNDROME (ICD-427.81) CAROTID ARTERY STENOSIS (ICD-433.10) TRANSIENT ISCHEMIC ATTACK, HX OF (ICD-V12.50) HYPERLIPIDEMIA (ICD-272.4)    Review of Systems       ROS is negative except as outlined in HPI.   Vital Signs:  Patient profile:   77 year old male Height:      69 inches Weight:      155 pounds Pulse rate:   60 / minute BP sitting:   126 / 69  (left arm) Cuff size:   regular  Vitals Entered By: Burnett Kanaris, CNA (Apr 10, 2010 9:14 AM)  Physical Exam  Additional Exam:  Gen. Well-nourished, in no distress   Neck: No JVD, thyroid not enlarged, no carotid bruits Lungs: No tachypnea, clear without rales, rhonchi or wheezes Cardiovascular: Rhythm regular, PMI not displaced,  heart sounds  normal, no murmurs or gallops, no peripheral edema, pulses normal in all 4 extremities. Abdomen: BS normal, abdomen soft and non-tender  without masses or organomegaly, no hepatosplenomegaly. MS: No deformities, no cyanosis or clubbing   Neuro:  No focal sns   Skin:  no lesions    PPM  Specifications Following MD:  Everardo Beals. Juanda Chance, MD     PPM Vendor:  St Jude     PPM Model Number:  [id number]     PPM Serial Number:  [id number] PPM DOI:  10/14/2007     PPM Implanting MD:  Everardo Beals. Juanda Chance, MD  Lead 1    Location: RA     DOI: 10/01/1995     Model #: [id number]     Serial #: [id number]     Status: active Lead 2    Location: RV     DOI: 09/13/1995     Model #: [id number]     Serial #: [id number]     Status: active   Indications:  Sick Sinus Syndrome,PAF   PPM Follow Up Remote Check?  No Battery Voltage:  2.80 V     Battery Est. Longevity:  7.5 years     Pacer Dependent:  No       PPM Device Measurements Atrium  Amplitude: 1.1 mV, Impedance: 395 ohms, Threshold: 1.25 V at 0.4 msec Right Ventricle  Amplitude: 12 mV, Impedance: 515 ohms, Threshold: 1.0 V at 0.4 msec  Episodes MS Episodes:  0     Percent Mode Switch:  0     Coumadin:  Yes Atrial Pacing:  80%     Ventricular Pacing:  <1%  Parameters Mode:  DDDR     Lower Rate Limit:  60     Upper Rate Limit:  90 Paced AV Delay:  300     Sensed AV Delay:  275 Next Cardiology Appt Due:  10/01/2010 Tech Comments:  No parameter changes.  TTM's with [employer].  Checked by [employer].  ROV 6 months with Dr. Juanda Chance. Altha Harm, LPN  Apr 10, 2010 9:52 AM   Impression & Recommendations:  Problem # 1:  PACEMAKER (ICD-V45.Marland Kitchen01) We interrogated his pacemaker today. His thresholds were good. He is pacing the atrium 80% of the time and the ventricle less than 1% of the time. He has had no mode switches.  Problem # 2:  ATRIAL FIBRILLATION (ICD-427.31) He has had no symptomatic recurrences and has had no mode switches on his pacemaker interrogation.  We will get labs for amiodarone surveillance. His updated medication list for this problem includes:    Pacerone 100 Mg Tabs (Amiodarone hcl) .Marland Kitchen... 1po once daily    Coumadin 5 Mg Tabs (Warfarin sodium) .Marland Kitchen... Take as directed by coumadin clinic.    Aspirin Adult Low Strength 81 Mg Tbec (Aspirin) .Marland Kitchen... Take 1 tablet  by mouth once a day  Problem # 3:  HYPERTENSION, BENIGN ESSENTIAL (ICD-401.1) This is well controlled on current medications. His updated medication list for this problem includes:    Aspirin Adult Low Strength 81 Mg Tbec (Aspirin) .Marland Kitchen... Take 1 tablet by mouth once a day    Diltiazem Hcl Er Beads 120 Mg Xr24h-cap (Diltiazem hcl er beads) .Marland Kitchen... Take one capsule by mouth daily  Problem # 4:  HYPERLIPIDEMIA (ICD-272.4) we will get. fasting lipid profile. His updated medication list for this problem includes:    Zocor 20 Mg Tabs (Simvastatin) .Marland Kitchen... Take 1 tablet by mouth once a day  Problem # 5:  CAROTID ARTERY STENOSIS (ICD-433.10) Kathy a carotid duplex this week  there was less than 40% bilateral stenosis. His updated medication list for this  problem includes:    Coumadin 5 Mg Tabs (Warfarin sodium) .Marland Kitchen... Take as directed by coumadin clinic.    Aspirin Adult Low Strength 81 Mg Tbec (Aspirin) .Marland Kitchen... Take 1 tablet by mouth once a day  Patient Instructions: 1)  Your physician recommends that you return for a FASTING lipid & liver profile: on a day that is good for you- 414.01;272.2.  2)  Your physician recommends that you continue on your current medications as directed. Please refer to the Current Medication list given to you today. 3)  Your physician wants you to follow-up in: 6 months.  You will receive a reminder letter in the mail two months in advance. If you don't receive a letter, please call our office to schedule the follow-up appointment.

## 2010-12-31 NOTE — Assessment & Plan Note (Signed)
Summary: rov-swelling  Medications Added HYDROCHLOROTHIAZIDE 25 MG TABS (HYDROCHLOROTHIAZIDE) Take one tablet by mouth daily.        Primary Provider:  dr Susa Simmonds  CC:  swelling in legs and feet.  History of Present Illness: The patient is 77 years old and came in for an unscheduled visit today because of lower extremity edema. This is going on for about 3 months but is worse recently. He recently took a long bus ride trip to [place]. He has had some numbness in the bottom of his feet no calf pain. He's had no shortness of breath or palpitations.  He has paroxysmal fibrillation which was managed with amiodarone and Coumadin. He has a DDD pacemaker.  His other problems include hypertension, hyperlipidemia, and history of TIA.  Current Medications (verified): 1)  Pacerone 100 Mg  Tabs (Amiodarone Hcl) .Marland Kitchen.. 1po Once Daily 2)  Coumadin 5 Mg  Tabs (Warfarin Sodium) .... Take As Directed By Coumadin Clinic. 3)  Aspirin Adult Low Strength 81 Mg Tbec (Aspirin) .... Take 1 Tablet By Mouth Once A Day 4)  Synthroid 25 Mcg  Tabs (Levothyroxine Sodium) .Marland Kitchen.. 1 By Mouth Qd 5)  Diltiazem Hcl Er Beads 120 Mg Xr24h-Cap (Diltiazem Hcl Er Beads) .... Take One Capsule By Mouth Daily 6)  Zocor 20 Mg Tabs (Simvastatin) .... Take 1 Tablet By Mouth Once A Day 7)  Docusate Sodium 100 Mg Caps (Docusate Sodium) .... Take 1 Tablet By Mouth Once A Day 8)  Multivitamins  Tabs (Multiple Vitamin) .... Take 1 Tablet By Mouth Once A Day 9)  Zolpidem Tartrate 10 Mg Tabs (Zolpidem Tartrate) .... 1/2 By Mouth At Bedtime As Needed 10)  Ativan 1 Mg Tabs (Lorazepam) .... Half To 1 By Mouth Once Daily As Needed 11)  Enoxaparin Sodium 120 Mg/0.21ml Soln (Enoxaparin Sodium) .... Inject 1 Syringe Subcutaneously Once Daily As Directed  Allergies: No Known Drug Allergies  Past History:  Past Surgical History: Last updated: 08/03/2008 Permanent pacemaker-Synchrony II DDD  Family History: Last updated: 2007/10/28 father  died of blood clot at 71 yo mother died 55 yo pneumonia  Social History: Last updated: 02/23/2009 Former Smoker Alcohol use-no Married   Past Medical History: Reviewed history from 07/26/2009 and no changes required. BRADYCARDIA (ICD-427.89) PREMATURE VENTRICULAR CONTRACTIONS (ICD-427.69) ATAXIA (ICD-781.3) THYROID FUNCTION TEST, ABNORMAL (ICD-794.5) URINARY URGENCY (ICD-788.63) SPECIAL SCREENING MALIG NEOPLASMS OTHER SITES (ICD-V76.49) BENIGN PROSTATIC HYPERTROPHY (ICD-600.00) FATIGUE (ICD-780.79) INSOMNIA-SLEEP DISORDER-UNSPEC (ICD-780.52) ANXIETY (ICD-300.00) PERIPHERAL VASCULAR DISEASE (ICD-443.9) ALLERGIC RHINITIS (ICD-477.9) HYPOTHYROIDISM (ICD-244.9) 1. Palpitations with documented premature ventricular contractions. 2. Paroxysmal atrial fibrillation, controlled with amiodarone. 3. Status post dual-mode, dual-pacing, dual-sensing pacemaker with     generator change with a [employer]. Jude Zephyr generator, August 2009. 4. Hypertension. 5. Hyperlipidemia. 6. History of transient ischemic attack.  HYPERTENSION, BENIGN ESSENTIAL (ICD-401.1) ENCOUNTER FOR LONG-TERM USE OF OTHER MEDICATIONS (ICD-V58.69) GROSS HEMATURIA (ICD-599.71) CEREBROVASCULAR ACCIDENT, HX OF (ICD-V12.50) ANTICOAGULATION THERAPY (ICD-V58.61) ATRIAL FIBRILLATION (ICD-427.31) SICK SINUS SYNDROME (ICD-427.81) CAROTID ARTERY STENOSIS (ICD-433.10) TRANSIENT ISCHEMIC ATTACK, HX OF (ICD-V12.50) HYPERLIPIDEMIA (ICD-272.4)    Review of Systems       ROS is negative except as outlined in HPI.   Vital Signs:  Patient profile:   77 year old male Height:      69 inches Weight:      160 pounds BMI:     23.71 Pulse rate:   62 / minute Resp:     14 per minute BP sitting:   130 / 60  (right arm)  Vitals Entered By:  Kimalexis Barnes (August 20, 2010 8:19 AM)  Physical Exam  Additional Exam:  Gen. Well-nourished, in no distress   Neck: No JVD, thyroid not enlarged, no carotid bruits Lungs: No tachypnea,  clear without rales, rhonchi or wheezes Cardiovascular: Rhythm regular, PMI not displaced,  heart sounds  normal, no murmurs or gallops, 1+ bilateral peripheral edema, pulses normal in all 4 extremities. Abdomen: BS normal, abdomen soft and non-tender without masses or organomegaly, no hepatosplenomegaly. MS: No deformities, no cyanosis or clubbing   Neuro:  No focal sns   Skin:  no lesions    PPM Specifications Following MD:  Everardo Beals. Juanda Chance, MD     PPM Vendor:  St Jude     PPM Model Number:  [id number]     PPM Serial Number:  [id number] PPM DOI:  10/14/2007     PPM Implanting MD:  Everardo Beals. Juanda Chance, MD  Lead 1    Location: RA     DOI: 10/01/1995     Model #: [id number]     Serial #: [id number]     Status: active Lead 2    Location: RV     DOI: 09/13/1995     Model #: [id number]     Serial #: [id number]     Status: active   Indications:  Sick Sinus Syndrome,PAF   PPM Follow Up Pacer Dependent:  No      Episodes Coumadin:  Yes  Parameters Mode:  DDDR     Lower Rate Limit:  60     Upper Rate Limit:  90 Paced AV Delay:  300     Sensed AV Delay:  275  Impression & Recommendations:  Problem # 1:  EDEMA (ICD-782.3)  The edema is most likely due to venous insufficiency. We will get Dopplers to rule out thrombophlebitis. We will do an echocardiogram to rule out change in his cardiac function. We will treat him with support hose and will start hydrochlorothiazide 25 mg daily. We will get labs today and a repeat BMP in one week.  Orders: Echocardiogram (Echo) Venous Duplex Lower Extremity (Venous Duplex Lower) TLB-TSH (Thyroid Stimulating Hormone) (84443-TSH) TLB-BMP (Basic Metabolic Panel-BMET) (80048-METABOL) TLB-CBC Platelet - w/Differential (85025-CBCD) TLB-Hepatic/Liver Function Pnl (80076-HEPATIC) TLB-Lipid Panel (80061-LIPID)  Problem # 2:  ATRIAL FIBRILLATION (ICD-427.31)  This is controlled on amiodarone and Coumadin. His updated medication list for this problem includes:    Pacerone 100 Mg Tabs  (Amiodarone hcl) .Marland Kitchen... 1po once daily    Coumadin 5 Mg Tabs (Warfarin sodium) .Marland Kitchen... Take as directed by coumadin clinic.    Aspirin Adult Low Strength 81 Mg Tbec (Aspirin) .Marland Kitchen... Take 1 tablet by mouth once a day  Orders: Echocardiogram (Echo) TLB-TSH (Thyroid Stimulating Hormone) (84443-TSH) TLB-BMP (Basic Metabolic Panel-BMET) (80048-METABOL) TLB-CBC Platelet - w/Differential (85025-CBCD) TLB-Hepatic/Liver Function Pnl (80076-HEPATIC) TLB-Lipid Panel (80061-LIPID)  Problem # 3:  HYPERTENSION, BENIGN ESSENTIAL (ICD-401.1) This is well-controlled on current medications. diltiazem could be contributing to the arrhythmias but am reluctant to stop this because of his work so well.  Problem # 4:  PACEMAKER (ICD-V45.Marland Kitchen01)  Patient Instructions: 1)  Your physician recommends that you schedule a follow-up appointment in: 3-4 weeks. 2)  Your physician recommends that you have lab work today: lipid/liver/bmet/cbc/tsh (782.3;427.31;402.10) 3)  You will need repeat labs in 1 week- bmet (782.3;427.31;402.10) 4)  Your physician has requested that you have a lower  extremity venous duplex.  This test is an ultrasound of the veins in the legs.  It looks at venous blood flow  that carries blood from the heart to the legs or arms.  Allow one hour for a Lower Venous exam.  There are no restrictions or special instructions. 5)  Your physician has requested that you have an echocardiogram.  Echocardiography is a painless test that uses sound waves to create images of your heart. It provides your doctor with information about the size and shape of your heart and how well your heart's chambers and valves are working.  This procedure takes approximately one hour. There are no restrictions for this procedure. 6)  Start Hydrochlorothiazide (HCTZ) 25mg  once daily. 7)  You have been given an order for support hose to be worn. You may purchase these at any medical supply store.  Prescriptions: HYDROCHLOROTHIAZIDE 25 MG  TABS (HYDROCHLOROTHIAZIDE) Take one tablet by mouth daily.  #30 x 6   Entered by:   Sherri Rad, RN, BSN   Authorized by:   Lenoria Farrier, MD, [facility]   Signed by:   Sherri Rad, RN, BSN on 08/20/2010   Method used:   Electronically to        [facility]       [address]       [place], [place]  [address]       Ph: [phone]       Fax: [phone]   RxID:   [id number]

## 2010-12-31 NOTE — Medication Information (Signed)
Summary: rov/ewj  Anticoagulant Therapy  Managed by: Cloyde Reams, RN, BSN Referring MD: Charlies Constable MD PCP: dr Theressa Millard MD: Ladona Ridgel MD, Sharlot Gowda Indication 1: Atrial Fibrillation (ICD-427.31) Indication 2: Hyperlipidemia (ICD-Incr L) Lab Used: [facility] Site: [facility] INR POC 2.3 INR RANGE 2 - 3  Dietary changes: no    Health status changes: no    Bleeding/hemorrhagic complications: no    Recent/future hospitalizations: no    Any changes in medication regimen? no    Recent/future dental: no  Any missed doses?: no       Is patient compliant with meds? yes       Allergies (verified): No Known Drug Allergies  Anticoagulation Management History:      The patient is taking warfarin and comes in today for a routine follow up visit.  Positive risk factors for bleeding include an age of 21 years or older.  The bleeding index is 'intermediate risk'.  Positive CHADS2 values include History of HTN and Age > 9 years old.  The start date was 03/12/1998.  His last INR was 2.6 RATIO.  Anticoagulation responsible provider: Ladona Ridgel MD, Sharlot Gowda.  INR POC: 2.3.  Cuvette Lot#: [id number].  Exp: 04/2011.    Anticoagulation Management Assessment/Plan:      The patient's current anticoagulation dose is Coumadin 5 mg  tabs: Take as directed by coumadin clinic..  The target INR is 2 - 3.  The next INR is due 03/14/2010.  Anticoagulation instructions were given to patient.  Results were reviewed/authorized by Cloyde Reams, RN, BSN.  He was notified by Cloyde Reams RN.         Prior Anticoagulation Instructions: INR 2.0  Take 5mg  today then resume same dosage2.5mg  daily except 5mg  on Mondays, Wednesdays, and Fridays.  Recheck in 4 weeks.    Current Anticoagulation Instructions: INR 2.3  Continue on same dosage 1/2 tablet daily except 1 tablet on Mondays, Wednesdays, and Fridays.  Recheck in 4 weeks.

## 2010-12-31 NOTE — Medication Information (Signed)
Summary: rov/tm  Anticoagulant Therapy  Managed by: Weston Brass, PharmD Referring MD: Charlies Constable MD PCP: dr Theressa Millard MD: Clifton James MD, Cristal Deer Indication 1: Atrial Fibrillation (ICD-427.31) Indication 2: Hyperlipidemia (ICD-Incr L) Lab Used: [facility] Site: [facility] INR POC 2.0 INR RANGE 2 - 3  Dietary changes: no    Health status changes: no    Bleeding/hemorrhagic complications: no    Recent/future hospitalizations: no    Any changes in medication regimen? no    Recent/future dental: no  Any missed doses?: no       Is patient compliant with meds? yes       Allergies: No Known Drug Allergies  Anticoagulation Management History:      The patient is taking warfarin and comes in today for a routine follow up visit.  Positive risk factors for bleeding include an age of 75 years or older.  The bleeding index is 'intermediate risk'.  Positive CHADS2 values include History of HTN and Age > 10 years old.  The start date was 03/12/1998.  His last INR was 2.6 RATIO.  Anticoagulation responsible provider: Clifton James MD, Cristal Deer.  INR POC: 2.0.  Cuvette Lot#: [id number].  Exp: 07/2011.    Anticoagulation Management Assessment/Plan:      The patient's current anticoagulation dose is Coumadin 5 mg  tabs: Take as directed by coumadin clinic..  The target INR is 2 - 3.  The next INR is due 05/13/2010.  Anticoagulation instructions were given to patient.  Results were reviewed/authorized by Weston Brass, PharmD.  He was notified by Weston Brass PharmD.         Prior Anticoagulation Instructions: INR 2.4 Continue 2.5mg s daily except 5mg s on Mondays, Wednesdays and Fridays. Recheck in 4 weeks.   Current Anticoagulation Instructions: INR 2.0  Continue same dose of 1/2 tablet every day except 1 tablet on Monday, Wednesday and Friday

## 2010-12-31 NOTE — Progress Notes (Signed)
Summary: REQUEST CALL ABOUT PACEMAKER   Phone Note Call from Patient Call back at Home Phone [phone]   Caller: Patient Summary of Call: Pt request call about pacemaker only want to talk to [facility] or Dr.Legion Discher Initial call taken by: Judie Grieve,  Apr 16, 2010 11:03 AM  Follow-up for Phone Call        I called and spoke with [name]. He states that ever since he was here for his visit, he feels nervous and like he has a "5-volt charge" running through him. He feels like he is not walking as well. He can't sleep on his right side and is having to sleep on his back so he isn't sleeping as well.  He is not feeling any skips or irregularity. I explained that it is noted no changes were made to his device when it was interrogated. I will talk with Amber and call the pt back. He is agreeable. Follow-up by: Sherri Rad, RN, BSN,  Apr 16, 2010 11:32 AM  Additional Follow-up for Phone Call Additional follow up Details #1::        I spoke with Amber- device RN- per [facility]- add on to Dr. Regino Schultze schedule and she will check him. I have called the pt and left him a message to come today at 4:30pm. Additional Follow-up by: Sherri Rad, RN, BSN,  Apr 16, 2010 12:14 PM

## 2010-12-31 NOTE — Progress Notes (Signed)
Summary: talk to nurse (talk w/ BB)   Phone Note Call from Patient Call back at Home Phone [phone]   Caller: Patient Reason for Call: Talk to Nurse Summary of Call: pt states his heart rate is acting up and still very tired Initial call taken by: Edman Circle,  July 31, 2010 12:58 PM  Follow-up for Phone Call        I spoke with the pt. He states that he started back exercising on monday afternoon. He walks about 15 minutes @ 2 mph on the treadmill. He has noticed on monday, after exercise, that his BP was 90/55. He states his HR was above 60. He was on the treadmill yesterday about 4pm for 15 minutes. At 5pm- bp - 90/56; HR- 84, 5:30pm- 99/58, HR- 76; 6pm- bp- 105/60, HR- 68, 7pm- bp-  100/60, HR-64; 10pm- bp- 88/60, HR- 65.. The pt states he felt wobbly and does when his bp goes down and his HR will not return to 60 bpm. Today he has not exercised and has no plans to. His bp has been recorded at 137/58, HR 60 & 151/88, HR- 60. He states he feels good today. He does tell me that when he saw Dr. Sandria Manly last thursday, he was put on Depakote ER 500mg  once daily for right leg seizures. He has a diagnosis of neuropathy also. He states he has had problems with right leg weakness and no balance with the right leg. He has a call in to Dr. Imagene Gurney office to talk about his symptoms as well. I explained to the pt. I would be curious to know if Depakote can have any of the effects he has explained to me. I will call him back around 5pm to see if he has heard back from Dr. Imagene Gurney office. He is agreeable. Follow-up by: Sherri Rad, RN, BSN,  July 31, 2010 2:05 PM  Additional Follow-up for Phone Call Additional follow up Details #1::        I spoke with the pt. He states Dr. Sandria Manly called him this afternoon and states he is going to order a head CT for the pt. He mentioned the possibility of backing off on the pt's amiodarone some more, but would leave this up to Dr. Juanda Chance. I explained I will talk with  Dr. Juanda Chance on friday and we will call the pt. back. He is agreeable.  Additional Follow-up by: Sherri Rad, RN, BSN,  July 31, 2010 5:44 PM

## 2010-12-31 NOTE — Medication Information (Signed)
Summary: rov/jm   Anticoagulant Therapy  Managed by: Bethena Midget, RN, BSN Referring MD: Charlies Constable MD PCP: dr Theressa Millard MD: Inetta Dicke MD, Reuel Boom Indication 1: Atrial Fibrillation (ICD-427.31) Indication 2: Hyperlipidemia (ICD-Incr L) Lab Used: [facility] [place] Site: [address] INR POC 2.1 INR RANGE 2 - 3  Dietary changes: no    Health status changes: no    Bleeding/hemorrhagic complications: no    Recent/future hospitalizations: no    Any changes in medication regimen? no    Recent/future dental: no  Any missed doses?: no       Is patient compliant with meds? yes       Allergies: No Known Drug Allergies  Anticoagulation Management History:      The patient is taking warfarin and comes in today for a routine follow up visit.  Positive risk factors for bleeding include an age of 75 years or older.  The bleeding index is 'intermediate risk'.  Positive CHADS2 values include History of HTN and Age > 75 years old.  The start date was 03/12/1998.  His last INR was 2.6 RATIO.  Anticoagulation responsible provider: Hisayo Delossantos MD, Reuel Boom.  INR POC: 2.1.  Cuvette Lot#: [id number].  Exp: 10/2011.    Anticoagulation Management Assessment/Plan:      The patient's current anticoagulation dose is Coumadin 5 mg  tabs: Take as directed by coumadin clinic..  The target INR is 2 - 3.  The next INR is due 09/30/2010.  Anticoagulation instructions were given to patient.  Results were reviewed/authorized by Bethena Midget, RN, BSN.  He was notified by Bethena Midget, RN, BSN.         Prior Anticoagulation Instructions: INR 1.9  Take one and one-half tablets today (Monday 9/19), then resume one-half tablet every day except one tablet on Monday, Wednesday, and Friday.  We will see you in three weeks.    Current Anticoagulation Instructions: INR 2.1 Continue 2.5mg s daily excpet 5mg s on Mondays, Wednesdays and Fridays. Recheck in 4 weeks.

## 2010-12-31 NOTE — Letter (Signed)
Summary: Generic Letter  Architectural technologist, Main Office  [address]   [place], [place] [address]   Phone: [phone]  Fax: [phone]        July 09, 2010 MRN: [id number]    DESTAN FRANCHINI [address] [place], [place]  [address]    Attn: [facility]  Mr. Monestime may hold coumadin for 5 days prior to colonoscopy if needed. You may contact our office with any questions.          Sincerely,   Charlies Constable, MD Sherri Rad, RN, BSN  This letter has been electronically signed by your physician.

## 2010-12-31 NOTE — Medication Information (Signed)
Summary: rov/sp  Anticoagulant Therapy  Managed by: Elaina Pattee, PharmD Referring MD: Charlies Constable MD PCP: dr Theressa Millard MD: Excell Seltzer MD, Casimiro Needle Indication 1: Atrial Fibrillation (ICD-427.31) Indication 2: Hyperlipidemia (ICD-Incr L) Lab Used: [facility] Site: [facility] INR POC 2.7 INR RANGE 2 - 3  Dietary changes: no    Health status changes: no    Bleeding/hemorrhagic complications: no    Recent/future hospitalizations: no    Any changes in medication regimen? no    Recent/future dental: no  Any missed doses?: no       Is patient compliant with meds? yes       Allergies: No Known Drug Allergies  Anticoagulation Management History:      The patient is taking warfarin and comes in today for a routine follow up visit.  Positive risk factors for bleeding include an age of 75 years or older.  The bleeding index is 'intermediate risk'.  Positive CHADS2 values include History of HTN and Age > 75 years old.  The start date was 03/12/1998.  His last INR was 2.6 RATIO.  Anticoagulation responsible provider: Excell Seltzer MD, Casimiro Needle.  INR POC: 2.7.  Cuvette Lot#: [id number].  Exp: 07/2011.    Anticoagulation Management Assessment/Plan:      The patient's current anticoagulation dose is Coumadin 5 mg  tabs: Take as directed by coumadin clinic..  The target INR is 2 - 3.  The next INR is due 06/14/2010.  Anticoagulation instructions were given to patient.  Results were reviewed/authorized by Elaina Pattee, PharmD.  He was notified by Elaina Pattee, PharmD.         Prior Anticoagulation Instructions: INR 2.0  Continue same dose of 1/2 tablet every day except 1 tablet on Monday, Wednesday and Friday   Current Anticoagulation Instructions: INR 2.7. Take 0.5 tablet daily except 1 tablet on Mon, Wed, Fri.  Recheck in 4 weeks.

## 2011-01-02 ENCOUNTER — Encounter (INDEPENDENT_AMBULATORY_CARE_PROVIDER_SITE_OTHER): Payer: Medicare Other

## 2011-01-02 ENCOUNTER — Encounter: Payer: Self-pay | Admitting: Cardiology

## 2011-01-02 ENCOUNTER — Ambulatory Visit: Admit: 2011-01-02 | Payer: Self-pay

## 2011-01-02 DIAGNOSIS — I4891 Unspecified atrial fibrillation: Secondary | ICD-10-CM

## 2011-01-02 DIAGNOSIS — Z7901 Long term (current) use of anticoagulants: Secondary | ICD-10-CM

## 2011-01-02 LAB — CONVERTED CEMR LAB: POC INR: 2.3

## 2011-01-02 NOTE — Progress Notes (Signed)
Summary: pt rtn call from yesterday  Phone Note Call from Patient Call back at Home Phone [phone]   Caller: Patient Reason for Call: Talk to Nurse, Talk to Doctor, Lab or Test Results Summary of Call: pt rtn call from yesterday about labwork and poss medication change Initial call taken by: Omer Jack,  November 12, 2010 9:32 AM  Follow-up for Phone Call        pt aware of results and copy mailed Dennis Bast, RN, BSN  November 12, 2010 9:44 AM

## 2011-01-02 NOTE — Letter (Signed)
Summary: From [facility]  From [facility]   Imported By: Marylou Mccoy 11/15/2010 15:09:46  _____________________________________________________________________  External Attachment:    Type:   Image     Comment:   External Document

## 2011-01-02 NOTE — Medication Information (Signed)
Summary: rov/tp  Anticoagulant Therapy  Managed by: Geoffry Paradise, PharmD Referring MD: Charlies Constable MD PCP: dr Theressa Millard MD: Jens Som MD, Arlys John Indication 1: Atrial Fibrillation (ICD-427.31) Indication 2: Hyperlipidemia (ICD-Incr L) Lab Used: [facility] [place] Site: [address] INR POC 2.1 INR RANGE 2 - 3  Dietary changes: no    Health status changes: no    Bleeding/hemorrhagic complications: no    Recent/future hospitalizations: no    Any changes in medication regimen? no    Recent/future dental: no  Any missed doses?: no       Is patient compliant with meds? yes       Allergies: No Known Drug Allergies  Anticoagulation Management History:      Positive risk factors for bleeding include an age of 13 years or older.  The bleeding index is 'intermediate risk'.  Positive CHADS2 values include History of HTN and Age > 75 years old.  The start date was 03/12/1998.  His last INR was 2.6 RATIO.  Anticoagulation responsible provider: Jens Som MD, Arlys John.  INR POC: 2.1.  Cuvette Lot#: [id number].  Exp: 01/2012.    Anticoagulation Management Assessment/Plan:      The patient's current anticoagulation dose is Coumadin 5 mg  tabs: Take as directed by coumadin clinic..  The target INR is 2 - 3.  The next INR is due 01/02/2011.  Anticoagulation instructions were given to patient.  Results were reviewed/authorized by Geoffry Paradise, PharmD.         Prior Anticoagulation Instructions: INR 2.7 Continue 1/2 pill everyday except 1 pill on Mondays, Wednesdays and Fridays. Recheck in 4 weeks.   Current Anticoagulation Instructions: INR:  2.1  Your INR is at goal today.  Please continue taking 1 tablet on Monday, Wednesday, Friday and 1/2 tablet other days of the week.  Return to clinic in 4 weeks for another INR check.

## 2011-01-08 NOTE — Medication Information (Signed)
Summary: rov/ewj  Anticoagulant Therapy  Managed by: Bethena Midget, RN, BSN Referring MD: Charlies Constable MD PCP: dr Theressa Millard MD: Shirlee Latch MD, Dalton Indication 1: Atrial Fibrillation (ICD-427.31) Indication 2: Hyperlipidemia (ICD-Incr L) Lab Used: [facility] [place] Site: [address] INR POC 2.3 INR RANGE 2 - 3  Dietary changes: no    Health status changes: no    Bleeding/hemorrhagic complications: no    Recent/future hospitalizations: no    Any changes in medication regimen? no    Recent/future dental: no  Any missed doses?: no       Is patient compliant with meds? yes       Allergies: No Known Drug Allergies  Anticoagulation Management History:      The patient is taking warfarin and comes in today for a routine follow up visit.  Positive risk factors for bleeding include an age of 75 years or older.  The bleeding index is 'intermediate risk'.  Positive CHADS2 values include History of HTN and Age > 75 years old.  The start date was 03/12/1998.  His last INR was 2.6 RATIO.  Anticoagulation responsible provider: Shirlee Latch MD, Dalton.  INR POC: 2.3.  Cuvette Lot#: [id number].  Exp: 01/2012.    Anticoagulation Management Assessment/Plan:      The patient's current anticoagulation dose is Coumadin 5 mg  tabs: Take as directed by coumadin clinic..  The target INR is 2 - 3.  The next INR is due 01/30/2011.  Anticoagulation instructions were given to patient.  Results were reviewed/authorized by Bethena Midget, RN, BSN.  He was notified by Bethena Midget, RN, BSN.         Prior Anticoagulation Instructions: INR:  2.1  Your INR is at goal today.  Please continue taking 1 tablet on Monday, Wednesday, Friday and 1/2 tablet other days of the week.  Return to clinic in 4 weeks for another INR check.   Current Anticoagulation Instructions: INR 2.3 Continue 1/2 pill everyday except 1 pill on Mondays, Wednesdays and Fridays. Recheck in 4 weeks.

## 2011-01-15 ENCOUNTER — Telehealth (INDEPENDENT_AMBULATORY_CARE_PROVIDER_SITE_OTHER): Payer: Self-pay | Admitting: *Deleted

## 2011-01-21 DIAGNOSIS — I4891 Unspecified atrial fibrillation: Secondary | ICD-10-CM

## 2011-01-22 NOTE — Progress Notes (Signed)
  Phone Note Other Incoming   Request: Send information Summary of Call: Request for records received from [facility]. Request forwarded to [facility].  All records-John

## 2011-01-30 ENCOUNTER — Encounter (INDEPENDENT_AMBULATORY_CARE_PROVIDER_SITE_OTHER): Payer: Medicare Other

## 2011-01-30 ENCOUNTER — Encounter: Payer: Self-pay | Admitting: Internal Medicine

## 2011-01-30 DIAGNOSIS — I4891 Unspecified atrial fibrillation: Secondary | ICD-10-CM

## 2011-01-30 DIAGNOSIS — Z7901 Long term (current) use of anticoagulants: Secondary | ICD-10-CM

## 2011-01-30 LAB — CONVERTED CEMR LAB: POC INR: 2.2

## 2011-02-06 NOTE — Medication Information (Signed)
Summary: rov/tm  Anticoagulant Therapy  Managed by: Cloyde Reams, RN, BSN Referring MD: Charlies Constable MD PCP: dr Theressa Millard MD: Tenny Craw MD, Gunnar Fusi Indication 1: Atrial Fibrillation (ICD-427.31) Indication 2: Hyperlipidemia (ICD-Incr L) Lab Used: [facility] [place] Site: [address] INR POC 2.2 INR RANGE 2 - 3  Dietary changes: no    Health status changes: no    Bleeding/hemorrhagic complications: no    Recent/future hospitalizations: no    Any changes in medication regimen? no    Recent/future dental: no  Any missed doses?: no       Is patient compliant with meds? yes       Allergies: No Known Drug Allergies  Anticoagulation Management History:      The patient is taking warfarin and comes in today for a routine follow up visit.  Positive risk factors for bleeding include an age of 75 years or older.  The bleeding index is 'intermediate risk'.  Positive CHADS2 values include History of HTN and Age > 2 years old.  The start date was 03/12/1998.  His last INR was 2.6 RATIO.  Anticoagulation responsible provider: Tenny Craw MD, Gunnar Fusi.  INR POC: 2.2.  Cuvette Lot#: [id number].  Exp: 12/2011.    Anticoagulation Management Assessment/Plan:      The patient's current anticoagulation dose is Coumadin 5 mg  tabs: Take as directed by coumadin clinic..  The target INR is 2 - 3.  The next INR is due 02/27/2011.  Anticoagulation instructions were given to patient.  Results were reviewed/authorized by Cloyde Reams, RN, BSN.  He was notified by Cloyde Reams RN.         Prior Anticoagulation Instructions: INR 2.3 Continue 1/2 pill everyday except 1 pill on Mondays, Wednesdays and Fridays. Recheck in 4 weeks.   Current Anticoagulation Instructions: INR 2.2  Continue on same dosage 1/2 tablet daily except 1 tablet on Mondays, Wednesdays, and Fridays.  Recheck in 4 weeks.

## 2011-02-12 ENCOUNTER — Telehealth: Payer: Self-pay | Admitting: Internal Medicine

## 2011-02-14 ENCOUNTER — Ambulatory Visit
Admission: RE | Admit: 2011-02-14 | Discharge: 2011-02-14 | Disposition: A | Discharge status: Home / Self Care | Payer: Medicare Other | Source: Ambulatory Visit | Attending: Neurology | Admitting: Neurology

## 2011-02-14 ENCOUNTER — Other Ambulatory Visit: Payer: Self-pay | Admitting: Neurology

## 2011-02-14 DIAGNOSIS — R269 Unspecified abnormalities of gait and mobility: Secondary | ICD-10-CM

## 2011-02-14 DIAGNOSIS — G609 Hereditary and idiopathic neuropathy, unspecified: Secondary | ICD-10-CM

## 2011-02-18 NOTE — Progress Notes (Signed)
Summary: dizziness and unbalance trouble with device/2nd call  Phone Note Call from Patient Call back at Home Phone [phone]   Reason for Call: Talk to Nurse Summary of Call: pt states having trouble with pacemaker pt would like to come in today. pt would like to talk to a nurse. pt states he is dizzy and  unbalance. Initial call taken by: Roe Coombs,  February 12, 2011 1:59 PM  Follow-up for Phone Call        pt states he took a walk and he feels better. pt states he does not have to come in.  Follow-up by: Roe Coombs,  February 12, 2011 3:46 PM  Additional Follow-up for Phone Call Additional follow up Details #1::        pt had stroke and is seeing Dr Sandria Manly tomorrow 02/13/11 at 12:00 BP 120/68 60 feels better  He is still having dizziness, but feels this is all neuro related.  He will call me back if he feels we need to see him Dennis Bast, RN, BSN  February 12, 2011 6:07 PM

## 2011-02-27 ENCOUNTER — Encounter: Payer: Medicare Other | Admitting: *Deleted

## 2011-02-28 ENCOUNTER — Ambulatory Visit (INDEPENDENT_AMBULATORY_CARE_PROVIDER_SITE_OTHER): Payer: Medicare Other | Admitting: *Deleted

## 2011-02-28 DIAGNOSIS — Z7901 Long term (current) use of anticoagulants: Secondary | ICD-10-CM

## 2011-02-28 DIAGNOSIS — I4891 Unspecified atrial fibrillation: Secondary | ICD-10-CM

## 2011-02-28 NOTE — Patient Instructions (Addendum)
INR 2.8  Continue current dose: 1 tablet (5mg ) on Monday, Wednesday and Friday 1/2 tablet (2.5mg ) on Tuesday, Thursday, Saturday, Sunday  Recheck INR on April 30th

## 2011-03-10 ENCOUNTER — Telehealth: Payer: Self-pay | Admitting: Internal Medicine

## 2011-03-10 NOTE — Telephone Encounter (Signed)
Spoke w/pt he states he called and spoke w/Sally earlier he has been vomiting all morning she advised to try gatarade and not worry about meds today, she states he has been sipping on gatarade for past couple of hours and has kept it down, he will hold meds today

## 2011-03-19 ENCOUNTER — Ambulatory Visit (INDEPENDENT_AMBULATORY_CARE_PROVIDER_SITE_OTHER): Payer: Medicare Other | Admitting: Internal Medicine

## 2011-03-19 ENCOUNTER — Encounter: Payer: Self-pay | Admitting: Internal Medicine

## 2011-03-19 DIAGNOSIS — I10 Essential (primary) hypertension: Secondary | ICD-10-CM

## 2011-03-19 DIAGNOSIS — G609 Hereditary and idiopathic neuropathy, unspecified: Secondary | ICD-10-CM

## 2011-03-19 DIAGNOSIS — R279 Unspecified lack of coordination: Secondary | ICD-10-CM

## 2011-03-19 DIAGNOSIS — I495 Sick sinus syndrome: Secondary | ICD-10-CM

## 2011-03-19 DIAGNOSIS — I4891 Unspecified atrial fibrillation: Secondary | ICD-10-CM

## 2011-03-19 MED ORDER — AMIODARONE HCL 100 MG PO TABS: ORAL_TABLET | ORAL | Status: DC

## 2011-03-19 NOTE — Assessment & Plan Note (Signed)
Decrease amiodarone as above

## 2011-03-19 NOTE — Patient Instructions (Signed)
Your physician recommends that you schedule a follow-up appointment in: 6 months with the device clinic Your physician has recommended you make the following change in your medication: Decrease amiodarone to 50 mg by mouth daily.

## 2011-03-19 NOTE — Assessment & Plan Note (Signed)
Decrease amiodarone as above 

## 2011-03-19 NOTE — Progress Notes (Signed)
The patient presents today for routine electrophysiology followup.  Since last being seen in our clinic, he reports doing very well. He remains active despite his age.  His primary concern is with ataxia/ unsteadiness.  He reports symptoms improved previously when amiodarone was decreased from 200mg  to 100mg  daily.  He remains in sinus rhythm.  Today, he denies symptoms of palpitations, chest pain, shortness of breath, orthopnea, PND, lower extremity edema, dizziness, presyncope, syncope, or neurologic sequela.  The patient feels that he is tolerating medications without difficulties and is otherwise without complaint today.   Past Medical History  Diagnosis Date  . Atrial fibrillation   . Sick sinus syndrome     s/p PPM  . Ataxia   . Hyperlipidemia   . Hypothyroidism   . BPH (benign prostatic hyperplasia)   . Hypertension   . TIA (transient ischemic attack)    Past Surgical History  Procedure Date  . Pacemaker insertion     implanted 10/96, most recent Gen Change by Dr Juanda Chance 8/08    Current Outpatient Prescriptions  Medication Sig Dispense Refill  . amiodarone (PACERONE) 100 MG tablet Take one half tablet by mouth daily      . aspirin 81 MG tablet Take 81 mg by mouth daily.        Marland Kitchen diltiazem (CARDIZEM) 120 MG tablet Take 120 mg by mouth daily.        Marland Kitchen docusate sodium (COLACE) 100 MG capsule Take 100 mg by mouth daily.        Marland Kitchen levothyroxine (SYNTHROID, LEVOTHROID) 25 MCG tablet Take 25 mcg by mouth daily.        Marland Kitchen LORazepam (ATIVAN) 1 MG tablet Take 0.5 mg by mouth as needed.        . Multiple Vitamin (MULTIVITAMIN) tablet Take 1 tablet by mouth daily.        . simvastatin (ZOCOR) 20 MG tablet Take 20 mg by mouth at bedtime.        Marland Kitchen warfarin (COUMADIN) 5 MG tablet Take by mouth as directed.        Marland Kitchen DISCONTD: amiodarone (PACERONE) 100 MG tablet Take 100 mg by mouth daily.          Allergies  Allergen Reactions  . Mucinex     Pt can not take mucinex PM    History    Social History  . Marital Status: Married    Spouse Name: N/A    Number of Children: N/A  . Years of Education: N/A   Occupational History  . Not on file.   Social History Main Topics  . Smoking status: Former Games developer  . Smokeless tobacco: Not on file  . Alcohol Use: No  . Drug Use: No  . Sexually Active: Not on file   Other Topics Concern  . Not on file   Social History Narrative  . No narrative on file    History reviewed. No pertinent family history.  ROS-  All systems are reviewed and are negative except as outlined in the HPI above  Physical Exam: Filed Vitals:   03/19/11 1147  BP: 140/60  Pulse: 60  Height: 5\' 9"  (1.753 m)  Weight: 152 lb (68.947 kg)    GEN- elderly male, NAD, alert and oriented x 3 today.   Head- normocephalic, atraumatic Eyes-  Sclera clear, conjunctiva pink Ears- hearing intact Oropharynx- clear Neck- supple, no JVP Lymph- no cervical lymphadenopathy Lungs- Clear to ausculation bilaterally, normal work of breathing Chest- pacemaker pocket is  well healed Heart- Regular rate and rhythm, no murmurs, rubs or gallops, PMI not laterally displaced GI- soft, NT, ND, + BS Extremities- no clubbing, cyanosis, or edema MS- no significant deformity or atrophy Skin- no rash or lesion Psych- euthymic mood, full affect Neuro- strength and sensation are intact

## 2011-03-19 NOTE — Assessment & Plan Note (Signed)
Maintaining sinus rhythm without recurrence since 2010 We will decrease amiodarone to 50mg  daily at this time to minimize side effects and longterm risks Continue coumadin longterm for stroke prevention.

## 2011-03-19 NOTE — Assessment & Plan Note (Signed)
Normal pacemaker function See Pace Art report No changes today  

## 2011-03-19 NOTE — Assessment & Plan Note (Signed)
Stable. No changes today. 

## 2011-03-31 ENCOUNTER — Encounter: Payer: Medicare Other | Admitting: *Deleted

## 2011-04-08 ENCOUNTER — Ambulatory Visit (INDEPENDENT_AMBULATORY_CARE_PROVIDER_SITE_OTHER): Payer: Medicare Other | Admitting: *Deleted

## 2011-04-08 DIAGNOSIS — I4891 Unspecified atrial fibrillation: Secondary | ICD-10-CM

## 2011-04-15 NOTE — Assessment & Plan Note (Signed)
[facility]                            CARDIOLOGY OFFICE NOTE   SHILOH, SWOPES                      MRN:          [id number]  DATE:12/15/2008                            DOB:          Mar 29, 1924    PRIMARY CARE PHYSICIAN:  Corwin Levins, MD   CLINICAL HISTORY:  [name] returned for followup management of his  atrial fibrillation and pacemaker.  He had a [employer]. Jude DDD pacemaker  implanted in 2000.  He had a remote pacemaker implanted and then in  2008, he had a [employer] DDD pacemaker implanted as a generator  change.  He has sick sinus syndrome and atrial fibrillation, which has  been managed with amiodarone.  He had a great deal of difficulty with  palpitations, but this finally settled out with program in the DDDR with  a long AV delay and with the infusion of Cardizem.  Cardizem has also  been very helpful in controlling his blood pressure, which was difficult  to control for a period of time.   About a week ago, he developed episodes of skipping of his heart after  being started on a combination of Prilosec and bicarbonate drug for  GERD.  He stopped the drug and his symptoms improved.   His past medical history is significant for history of TIA,  hyperlipidemia, and hypertension.   CURRENT MEDICATIONS:  1. Levothyroxine.  2. Zocor.  3. Warfarin.  4. Amiodarone 100 mg daily.  5. Cardizem 180 mg daily.  6. Aspirin 81 mg.  7. Stool softner.  8. Multivitamins.   PHYSICAL EXAMINATION:  VITAL SIGNS:  Blood pressure is 120/69, pulse 51  and regular.  NECK:  There is no venous distension.  The carotid pulses were full  without bruits.  CHEST:  Clear.  CARDIAC:  Rhythm was regular.  No murmurs or gallops.  ABDOMEN:  Soft.  Normal bowel sounds.  There is no hepatosplenomegaly.  EXTREMITIES:  Peripheral pulses were full with no peripheral edema.   Electrocardiogram showed atrial pacing.  The ventricular complexes  showed  minor nonspecific ST-T changes.   IMPRESSION:  1. Paroxysmal atrial fibrillation, controlled on amiodarone.  2. Status post dual-mode, dual-pacing, dual-sensing pacemaker with a      [employer]. Jude [place] generator, August 2009.  3. Recent palpitations related to bicarbonate, now resolved.  4. Hypertension.  5. Hyperlipidemia.  6. History of transient ischemic attack.   RECOMMENDATIONS:  I think [name] is doing quite well at present.  We  will not make any changes in his medication.  We did interrogate his  pacemaker and he had no atrial fibrillation, he had good thresholds.  I  will plan to see him back in followup in 6 months.     Bruce Elvera Lennox Juanda Chance, MD, [facility]  Electronically Signed    BRB/MedQ  DD: 12/15/2008  DT: 12/16/2008  Job #: [id number]

## 2011-04-15 NOTE — Assessment & Plan Note (Signed)
[facility]                            CARDIOLOGY OFFICE NOTE   RONDO, SPITTLER                      MRN:          [id number]  DATE:12/30/2007                            DOB:          27-Feb-1924    PRIMARY CARE PHYSICIAN:  Corwin Levins, M.D.   CLINICAL HISTORY:  [name] is 77 years old and returned for  followup and management of his atrial fibrillation and pacemaker.  He  has sick sinus syndrome and is on amiodarone and has maintained sinus  rhythm for some time.  He recently had a generator change and had a Licensed conveyancer DDD pacemaker placed.  Since that time, he has an  intermittent problem where he feels his heart beating funny in his chest  and does not feel well.  We interrogated his pacemaker today and found  that he was having some AV pacing, where he had not had this before.  We  shortened the AV delay so he would intentionally pace his ventricle, and  he felt the same kind of feeling.   His other major problem is hypertension, and his pressures have been up  and down, but he has been doing better with this and has been off  antihypertensive medications and has recorded his pressures at home, and  they have been in the 120-135 range systolic.   PAST MEDICAL HISTORY:  1. Previous TIAs.  2. Hyperlipidemia.  He became intolerant to ZOCOR, with muscle aches,      and is currently on pravastatin.   PHYSICAL EXAMINATION:  VITAL SIGNS:  The blood pressure is 155/72, and  the pulse 64 and regular.  NECK:  There was no venous distention.  The carotid pulses were full,  without bruits.  CHEST:  Clear.  CARDIAC:  Rhythm was regular.  I could hear no murmurs or gallops.  ABDOMEN:  Soft, with normal bowel sounds.  There is no  hepatosplenomegaly.  EXTREMITIES:  Peripheral pulses are full.  There is no peripheral edema.   We interrogated his pacemaker.   IMPRESSION:  1. Sick sinus syndrome, with paroxysmal atrial fibrillation,     controlled on amiodarone.  2. Status post recent generator change with Zephyr DDD generator.  3. Symptoms of palpitations, probably related to ventricular pacing.  4. History of transient ischemic attack.  5. Hyperlipidemia.  6. Good left ventricular function.   RECOMMENDATIONS:  I think [name] symptoms are probably related to  ventricular pacing.  He had ventricular pacing about 40% of the time.  The reason for this is not clear.  We put him in AAI, and his AV delay  was under 200.  He has never had any documented high-degree AV block.  For this reason, we will leave him on AAI and see if this helps.  He  already says that he feels better.  We will also arrange for him to have  an exercise rest/stress Myoview scan.  He asked that I be there for the  exercise part because he has some concern about it.  If he is not able  obtain adequate exercise, then we will convert him to an adenosine.  I  will schedule him for a follow-up visit in 3 months.     Bruce Elvera Lennox Juanda Chance, MD, [facility]  Electronically Signed    BRB/MedQ  DD: 12/30/2007  DT: 12/31/2007  Job #: [id number]

## 2011-04-15 NOTE — Assessment & Plan Note (Signed)
[facility]                            CARDIOLOGY OFFICE NOTE   JASAUN, CARN                      MRN:          [id number]  DATE:09/14/2008                            DOB:          January 09, 1924    PRIMARY CARE PHYSICIAN:  Corwin Levins, MD   CLINICAL HISTORY:  Mr. Turman is 77 years old and returned for followup  management of his atrial fibrillation and pacemaker.  He has a St. Jude  Cypher DDD pacemaker implanted with a generator change in 2008 for sick  sinus syndrome with paroxysmal atrial fibrillation.  He is maintained on  amiodarone.  Early this year, he has had a great deal of difficulty with  palpitations and had an episode of what was thought to be atrial  fibrillation down in [place].  We increased his amiodarone and  adjusted some of his medications and he still great deal of difficulty  with palpitations.  We finally put him back in DDDR with a very long AV  delay and we switched him from Norvasc to Cardizem and this has helped a  great deal.  He says he is now feeling as well as he has in a long time.  He had no palpitations.   PAST MEDICAL HISTORY:  Significant for history of TIA and  hyperlipidemia.   CURRENT MEDICATIONS:  Coumadin, aspirin, levothyroxine, simvastatin 20  mg, amiodarone 100 mg, Cardizem CD 180 mg, and doxycycline.   PHYSICAL EXAMINATION:  VITAL SIGNS:  Blood pressure is 128/72.  The  pulse is 60 and regular.  NECK:  There is no venous distension.  The carotid pulses were full.  I  could not hear bruits.  CHEST:  Clear without rales or rhonchi.  CARDIAC:  Rhythm was regular.  I could hear no murmurs or gallops.  ABDOMEN:  Soft with normal bowel sounds.  There is no  hepatosplenomegaly.  EXTREMITIES:  Peripheral pulses are full and no peripheral edema.   IMPRESSION:  1. Paroxysmal atrial fibrillation controlled with amiodarone.  2. Status post dual-mode, dual-pacing, dual-sensing pacemaker  implantation with generator change in August 2009 with a St. Jude      Cypher generator.  3. Recent palpitations, now quiescent.  4. Hypertension.  5. History of transient ischemic attack.  6. Hyperlipidemia.   RECOMMENDATIONS:  Caulin appears to be doing extremely well now.  We  will plan to continue his current medications.  I will plan to see him  back in follow up in 6 months.    Bruce Elvera Lennox Juanda Chance, MD, [facility]  Electronically Signed   BRB/MedQ  DD: 09/14/2008  DT: 09/14/2008  Job #: [id number]

## 2011-04-15 NOTE — Assessment & Plan Note (Signed)
[facility]                         ELECTROPHYSIOLOGY OFFICE NOTE   [name], [name]                      MRN:          [id number]  DATE:10/18/2007                            DOB:          12-13-1923    [name] was seen today in the device clinic for followup of his St.  Architect pacemaker.  Model number is 5826, implanted on October 04, 2007 for sick sinus syndrome and paroxysmal atrial fibrillation.   Interrogation of his device demonstrates P waves of 1.2 mV with an  atrial impedance of 373 ohm and a threshold of 1.25 volts at 0.4 ms.  His R waves measured greater than 12 mV with an RV impedance of 458 ohm  with a threshold of 0.875 volts at 0.5 ms.  His battery voltage was 2.81  volts.   He was in sinus bradycardia today and is not pacemaker-dependent.  He  had no mode switch or ventricular high-rate episodes since implant.  He  is currently A-pacing 78% of the time, V-pacing less than 1% of the  time.   His Steri-Strips were removed today.  His wound was without redness or  swelling.  He has an appointment for November 26th to return to the  clinic with Dr. Juanda Chance.      Gypsy Balsam, RN,BSN  Electronically Signed      Everardo Beals. Juanda Chance, MD, [facility]  Electronically Signed   AS/MedQ  DD: 10/18/2007  DT: 10/19/2007  Job #: [id number]

## 2011-04-15 NOTE — Assessment & Plan Note (Signed)
[name]                            CARDIOLOGY OFFICE NOTE   [name], [name]                      MRN:          [id number]  DATE:07/24/2008                            DOB:          [birth year]    PRIMARY CARE PHYSICIAN:  Kyle Levins, MD   CLINICAL HISTORY:  [name] returns for followup management of his  atrial fibrillation and pacemaker.  He is still feeling poorly.  He  states sometimes he feels his heart jump.  He was seen in the [facility]  and had an ECG which showed documented 1 PVC.  We have interrogated his  pacemaker syndrome, not documented any recurrent atrial fibrillation,  although we did increase his amiodarone from 100 back to 200 a day.  He  also has symptoms of ataxia and dizziness and has not played tennis or  gone dancing for sometime.  He also has trouble sleeping at night.   PAST MEDICAL HISTORY:  Significant for history of TIA and  hyperlipidemia.   CURRENT MEDICATIONS:  Coumadin, Colace, aspirin, levothyroxine, [name],  simvastatin, and amiodarone 10 mg a day.   PHYSICAL EXAMINATION:  VITAL SIGNS:  His blood pressure is 170/70, pulse  is 60 and regular.  __________.  NECK:  Carotid pulses are full without bruits.  CHEST:  Clear.  HEART:  Regular rate and rhythm.  There is no murmurs, rubs, or gallops.  ABDOMEN:  Soft without hepatomegaly.   We have interrogated pacemaker __________.   IMPRESSION:  1. Palpitations of uncertain etiology.  2. __________.  3. __________ uncertain etiology.  4. Sick sinus syndrome with paroxysmal atrial fibrillation, treated      with amiodarone.  5. Status post generator change with a Zypher DC generator implanted      November 2008.  6. History of transient ischemic attack.  7. Hyperlipidemia.  8. Good left ventricular function.   RECOMMENDATIONS:  I am not really convinced that [name]'s symptoms are  related to his cardiac arrhythmia.  We have not documented any  atrial  fibrillation and we have only documented 1 PVC.  We are going to  reprogram him to the mode that he felt best which is DDDR with a very  long AV delay.  This will also enable [place] to see if he has any high  ventricular episodes and will program his pacemaker to detect beats of 6  or more at a rate of 140 or more.  We will cut his amiodarone back from  200 back to 100 and he has had no recurrent atrial fibrillation.  We  will arrange for him to see [name] back in follow up to evaluate the  ataxia and help decide about the amiodarone.  We will also get him back  to [name] since I think many of his symptoms are not related to  cardiac symptoms.  There may be some element of anxiety and depression.  He also feels the [name] is part of the problem and he has not had any  of these symptoms before he started that, so we will switch him  from  [name] 5 to [name] 180.  I will see him back in 6 weeks.     Kyle Elvera Lennox Juanda Chance, MD, [name]  Electronically Signed    BRB/MedQ  DD: 07/24/2008  DT: 07/25/2008  Job #: [id number]   cc:   [name], M.D.  Kyle Levins, MD

## 2011-04-15 NOTE — Assessment & Plan Note (Signed)
[facility]                            CARDIOLOGY OFFICE NOTE   KIANTE, CIAVARELLA                      MRN:          [id number]  DATE:05/04/2007                            DOB:          08-19-1924    REFERRING PHYSICIAN:  Corwin Levins, MD   CLINICAL HISTORY:  [name] is now 77 years old.  He has sick sinus  syndrome with paroxysmal atrial fibrillation, which has been very well  controlled on amiodarone.  He has a Synchrony II DD pacemaker in place,  which is programmed so he paces AAI most all of the time.  He says he  has been doing extremely well, and has had no recent chest pain,  shortness of breath, or palpitations.  He was seen in the emergency room  in April with an episode of dizziness and vertigo.  He was told he might  have had a light TIA.  He subsequently saw Dr. Sandria Manly in followup.  No  change in his treatment was recommended.  He is already on Coumadin and  aspirin.   He says he has been more active recently, and has been playing some  tennis over the last several weeks.   His past medical history is significant for:  1. Hyperlipidemia.  2. Mild carotid stenosis by Dopplers.  3. Previous history of TIA.   His recent Doppler showed 40% to 59% on the right, and zero to 39% on  the left, which is slightly more on the right with recommendations for a  followup in a year.   His current medications include amiodarone, Zocor, multivitamins,  Coumadin, stool softener, aspirin, and Synthroid.   EXAMINATION:  The blood pressure was 123/71.  Pulse 61 and regular.  There was no venous distention.  The carotid pulses were full.  I could  not hear bruits.  CHEST:  Clear without rales or rhonchi.  The cardiac rhythm was regular.  I could hear no murmurs or gallops.  ABDOMEN:  Soft without organomegaly.  There was no pulsatile masses.  There was no hepatosplenomegaly.  Peripheral pulses were full, and there was no peripheral edema.   We  interrogated his pacemaker, and he had good thresholds, and good  impedances on both leads.  He is not pacer dependent.  Estimated  longevity is 7 months.   IMPRESSION:  1. Sick sinus syndrome with paroxysmal atrial fibrillation, controlled      on amiodarone.  2. Status post Synchrony II DVD pacemaker implantation, not pacer      dependent.  3. History of transient ischemic attack, on Coumadin therapy with      possible recent transient ischemic attack.  4. Hyperlipidemia.  5. Mild carotid stenosis.   RECOMMENDATIONS:  I think [name] is doing quite well.  We will not make  any changes in his therapy.  He is not quite a target on his cholesterol  values, but he wants to try a little further with diet.  We will plan to  see him back in 6 months.     Bruce Elvera Lennox Juanda Chance, MD, [facility]  Electronically Signed  BRB/MedQ  DD: 05/04/2007  DT: 05/04/2007  Job #: [id number]

## 2011-04-15 NOTE — Assessment & Plan Note (Signed)
[facility]                            CARDIOLOGY OFFICE NOTE   [name]                      MRN:          [id number]  DATE:07/28/2007                            DOB:          10/01/1924    PRIMARY CARDIOLOGIST:  Everardo Beals. Juanda Chance, M.D., [facility].   PRIMARY CARE [name]:  [name], M.D.   PRIMARY NEUROLOGIST:  [name], M.D.   PATIENT PROFILE:  An 77 year old Caucasian male with a prior history of  sick sinus syndrome and PAF as well as TIA, who presents with a 5 to 6  day history of ataxia.   PROBLEM LIST:  1. Ataxia.  2. Recent history of (?) TIA in April of 2007.  3. Mild carotid stenosis by Dopplers.  4. Hyperlipidemia.  5. Paroxysmal atrial fibrillation.  6. Hypothyroidism.  7. Sick sinus syndrome status post Synchrony II DDD permanent      pacemaker.   HISTORY OF PRESENT ILLNESS:  An 77 year old Caucasian male with the  above problem list.  He presents today reporting a 5 to 6 day history of  general unsteadiness on his feet as well as light-headedness, mostly  noted when he is up and about.  After lunch today, he got out of his car  and acutely felt much more unsteady, feeling as though he had to widen  his stance in order to steady his gait.  He denies any dizziness or  sensations as though he might fall, but otherwise, feels uneasy with  walking.  He initially called in to the office and then came in as a  walk-in.  He denies any chest pain, shortness of breath, PND, or  orthopnea, frank dizziness, or vertiginous symptoms, or syncope.   ALLERGIES:  No known drug allergies.   HOME MEDICATIONS:  1. Amiodarone 100 mg daily.  2. Zocor 20 mg daily.  3. Multivitamin daily.  4. Coumadin as directed.  5. Stool softener daily.  6. Aspirin 81 mg daily.  7. Synthroid 25 mcg daily.   PHYSICAL EXAM:  Blood pressure lying 145/79 with a heart rate of 60,  blood pressure sitting 136/76 with a heart rate of 60, blood  pressure  standing at 5 minutes 122/73 with a heart rate of 67.  He did feel somewhat light-headed and unsteady while sitting and  standing.  Pleasant white male in no acute distress.  Alert and oriented  x3.  NEURO:  Cranial nerves 2-12 are grossly intact.  He has normal rapid  alternating movements, as well as finger to finger and finger to nose.  He has a negative Romberg sign.  He is very unsteady attempting to walk  heel to toe and also becomes unsteady and has difficulty doing heel to  shin bilaterally.  Strength is otherwise 5/5 in the bilateral upper and  lower extremities.  NECK:  No bruits or JVD.  LUNGS:  Respirations regular and unlabored.  CARDIAC:  Regular S1, S2.  No murmurs.  ABDOMEN:  Round, soft, and nontender, nondistended.  Bowel sounds  present x4.  EXTREMITIES:  Warm and dry.  Pink.  No cyanosis, clubbing, or edema.   ACCESSORY CLINICAL FINDINGS:  CK pending.   ASSESSMENT AND PLAN:  1. Ataxia (?) posterior circulation stroke.  The patient is otherwise      stable.  He has been evaluated by Dr. Juanda Chance and he has followup      with Dr. Sandria Manly tomorrow.  We will not pursue any radiologic      examinations at this time, as he has followup with neurology      tomorrow.  He is on Coumadin therapy.  2. Hyperlipidemia.  The patient wishes to change from Zocor to      Pravachol given the interaction between Zocor and amiodarone.  We      explained that Zocor 20 mg is an acceptable dose in combination      with amiodarone.  However, he would prefer to switch, and we will      therefore give him a prescription for Pravachol.  3. Paroxysmal atrial fibrillation.  He is on amiodarone and Coumadin.  4. Hypothyroidism.  Continue Synthroid.   DISPOSITION:  The patient will follow up with Dr. Juanda Chance in  approximately 3 months and has followup with Dr. Sandria Manly this week.      Nicolasa Ducking, ANP  Electronically Signed      Everardo Beals. Juanda Chance, MD, [facility]  Electronically  Signed   CB/MedQ  DD: 07/28/2007  DT: 07/29/2007  Job #: [id number]

## 2011-04-15 NOTE — Assessment & Plan Note (Signed)
[facility] [facility]                         ELECTROPHYSIOLOGY OFFICE NOTE   JOHANN, SANTONE                      MRN:          [id number]  DATE:01/17/2009                            DOB:          [birth year]    Mr. Eskelson returns today for followup.  He is referred today by Dr.  Juanda Chance for recommendations about palpitations and irregular heartbeats.  The patient is very pleasant elderly male with a history of sick sinus  syndrome and paroxysmal AFib, status post pacemaker and amiodarone  therapy.  He has a history of remote stroke in the past.  He has a  history of dyslipidemia and carotid artery disease.  The patient over  the last several months, has had problems with symptomatic palpitations  and what he describes as skipped beats.  He states this is not at all  like. When he had his AFib.  The patient has been on amiodarone, though  he had a dose decreased in the last several months because of problems  with neuropathy.  He is currently on 100 mg daily.  The patient states  that he feels like his heart is beating regularly, most of the time, but  other times, he will have what he describes as skipped beats.  It is  quite bothersome to him.  He has never had frank syncope, but does note  that his heart rate when he checks it which he does frequently with  blood pressure machine has varied from the low 30s to the mid 50s at  times, but not at a rate of 60 as he had been led to believe it should  be at.  The patient has had no frank syncope.  He denies chest pain or  shortness of breath, currently.  He would like to go back to exercise  and he notes that he plays tennis on a regular basis.   ADDITIONAL PAST MEDICAL HISTORY:  Notable for chronic Coumadin therapy.  He has a history of dyslipidemia.  He has preserved LV function by echo  with a normal stress perfusion imaging in the past.   FAMILY HISTORY:  Noncontributory at his advanced age.   SOCIAL  HISTORY:  The patient is married.  He denies tobacco or ethanol  abuse.   REVIEW OF SYSTEMS:  Notable for occasional dizzy spells and lightheaded  spells.  He has mild arthritic complaints.  He has numbness in his feet  secondary to neuropathy from thought to be due to amiodarone.  Otherwise, all systems reviewed and found to be negative except as noted  in the HPI above.   PHYSICAL EXAMINATION:  GENERAL:  He is a pleasant elderly-appearing man  in no acute distress.  VITAL SIGNS:  Blood pressure today was 115/65, the pulse was 59 and  regular, and respirations were 18.  Weight was not recorded.  HEENT:  Normocephalic and atraumatic.  Pupils equal, round.  Oropharynx  is moist.  Sclerae are anicteric.  NECK:  No jugular venous distention.  There is no thyromegaly.  Trachea  is midline.  Carotids are 2+ symmetric.  LUNGS:  Clear  bilaterally to auscultation.  No wheezes, rales, or  rhonchi are present.  No increased work of breathing.  CARDIOVASCULAR:  Regular rate and rhythm.  Normal S1 and S2.  I did not  appreciate any murmurs, rubs, or gallops today.  PMI was not enlarged  nor was it laterally displaced.  ABDOMEN:  Soft and nontender.  There is no organomegaly.  There is no  rebound or guarding.  EXTREMITIES:  No cyanosis, clubbing, or edema.  The pulses were 2+ and  symmetric.  NEUROLOGIC:  Alert and oriented x3.  Cranial nerves intact.  The  strength was 5/5 and symmetric.   The interrogation of his pacemaker was carried out today demonstrating a  St. Architect.  P-waves were 1.  The R-waves are greater than 12.  The  impedance was 371 in the atrium and 455 in the right ventricle.  Threshold was 1.25 at 0.4 in the A and 1.4 in the RV.  Battery voltage  was 2.77 volts.  There are no mode switches.  The patient had rare PVCs.   IMPRESSION:  1. Symptomatic premature ventricular contractions.  2. History of paroxysmal atrial fibrillation, now quiescent on      amiodarone.  3.  Symptomatic bradycardia, status post pacemaker insertion.   DISCUSSION:  Mr. Corcoran is stable, but he has clearly symptomatic PVC's.  There is actually no evidence of atrial fibrillation.  I have discussed  the benign nature of these symptoms with the patient and that his  pacemaker is in fact working normally.  I have recommend that he undergo  a period of watchful waiting.  Obviously, if he had change in his  symptoms and we will certainly consider up titration of his amiodarone  or adding other medications, but for now like a period of watchful  waiting.  I would recommend no additional treatment of this patient at  the present time.     Doylene Canning. Ladona Ridgel, MD  Electronically Signed    GWT/MedQ  DD: 01/17/2009  DT: 01/18/2009  Job #: [id number]

## 2011-04-15 NOTE — Assessment & Plan Note (Signed)
[facility] HEALTHCARE                            CARDIOLOGY OFFICE NOTE   [name], [name]                      MRN:          [id number]  DATE:09/30/2007                            DOB:          09-29-1924    PRIMARY CARE PHYSICIAN:  Corwin Levins, M.D.   CLINICAL HISTORY:  The patient is 77 years old and returned for follow-  up management of his pacemaker after he was found to be at [facility].  He has  sick sinus syndrome and has a Synchrony II DDD pacemaker that was  implanted in October of 1996.  His paroxysmal atrial fibrillation has  been controlled on amiodarone.  He recently was found to be at [facility] and  was brought in for evaluation.  He has had no recent symptoms of chest  pain, shortness of breath, or palpitations.   PAST MEDICAL HISTORY:  Significant for a history of TIA.  He had an  episode two months ago of vertigo and saw Dr. Avie Echevaria who felt he had  a posterior circulation TIA.  He also has mild carotid stenosis by  Dopplers and a history of hyperlipidemia.  He has good LV function.   CURRENT MEDICATIONS:  1. Amiodarone 100 mg daily.  2. Pravastatin 40 mg daily.  3. Coumadin as directed.  4. Aspirin 81 mg.  5. Synthroid 0.025 mg.   PHYSICAL EXAMINATION:  VITAL SIGNS:  Blood pressure today was 138/84,  pulse 60 and regular.  NECK:  There was no venous distention.  Carotid pulses were full and I  could not hear definite bruit.  CHEST:  Clear without rales or rhonchi.  HEART:  Rhythm was regular.  Sounds were normal with no murmurs or  gallops.  ABDOMEN:  Soft with normal bowel sounds.  There was no  hepatosplenomegaly.  EXTREMITIES:  Peripheral pulses were full and there was no peripheral  edema.   We interrogated his pacemaker today and he was at [facility] with a battery  voltage of 2.39.  His lead impedances were good.  We did not check his  threshold today, but will check them when he comes in for his generator  change.  They have been  good in the past.   IMPRESSION:  1. Sick sinus syndrome with paroxysmal atrial fibrillation controlled      on amiodarone.  2. Status post Synchrony II DDD pacemaker implantation, October 1996      now at [facility] not pacer dependent.  3. History of transient ischemic attack with recent transient ischemic      attack in August of 2008.  4. Hyperlipidemia.  5. Mild carotid stenosis.   RECOMMENDATIONS:  The patient's pacemaker is at [facility] and we will plan to  bring him in for generator change on Monday.  We will get an INR today  and I will decide about stopping his Coumadin a day or two prior to the  procedure.  We will target an INR of about 2 at the time we do the  procedure and will not need to do heparin Coumadin overlap.  We will  also check his thresholds when he comes in Monday morning.  He is not  pacer dependent, so he should not need a temporary pacemaker during the  generator change.  I will arrange a follow-up in three weeks after his  generator change.     Bruce Elvera Lennox Juanda Chance, MD, [facility]  Electronically Signed    BRB/MedQ  DD: 09/30/2007  DT: 09/30/2007  Job #: [id number]

## 2011-04-15 NOTE — Assessment & Plan Note (Signed)
[facility] HEALTHCARE                            CARDIOLOGY OFFICE NOTE   DORYAN, BAHL                      MRN:          [id number]  DATE:12/21/2008                            DOB:          08-22-1924    PRIMARY CARE PHYSICIAN:  Corwin Levins, MD   CLINICAL HISTORY:  [name] came in today for an unscheduled visit  because of increased palpitations over the past week.  I had just seen  him on December 15, 2008, and he was doing fairly well.  He did have some  palpitations which he related to taking bicarbonate.  After I saw him,  he has been having palpitations every day at night where he describes  his heart skipping and missing beats.  He came in today for evaluation.  He has had no chest pain or shortness of breath.   Jahmel has a St. Jude DDD pacemaker which was implanted in 2000, and in  2008 he had a Zephyr generator change.  He has a history of paroxysmal  atrial fibrillation and previous PVCs.   His past medical history is significant for previous TIA.  He has  hyperlipidemia and hypertension.   His current medications include:  1. Levothyroxine 0.25 daily.  2. Zocor 20 mg daily.  3. Coumadin.  4. Amiodarone 100 mg which he increased to 200 two days ago.  5. Cardizem CD 180 mg daily.  6. Aspirin.  7. Multivitamins.   On examination today, the blood pressure was 140/73 and the pulse 69 and  irregular.  There was no venous distension.  The carotid pulses were  full without bruits.  The chest was clear.  The cardiac rhythm was  regular.  He had no murmurs or gallops.  The abdomen was soft with  normal bowel sounds.  Peripheral pulses were full.  There was no  peripheral edema.   An electrocardiogram showed atrial pacing with PVCs, and there was some  blocked atrial-paced beats with ventricular pacing.  On interrogation of  the pacemaker, he did not have any ventricular pacing and he did not  have any atrial fibrillation.   IMPRESSION:  1. Palpitations with documented premature ventricular contractions.  2. Paroxysmal atrial fibrillation, controlled with amiodarone.  3. Status post dual-mode, dual-pacing, dual-sensing pacemaker with      generator change with a St. Jude Zephyr generator, August 2009.  4. Hypertension.  5. Hyperlipidemia.  6. History of transient ischemic attack.   RECOMMENDATIONS:  I think Harrel's palpitations are related to PVCs.  We  switched him to AAI which he says he felt the best in that mode which  will limit any ventricular pacing, although I am not sure this will stop  the problem since it appears the PVCs more than the ventricular pacing  is causing his symptoms.  We will increase his amiodarone to 200 a day  to see if that will help with the PVCs, but I told him it might take 2  weeks for that to make a difference.  His sugar was up, and we told him  we will get  a hemoglobin A1c on his return visit and I will get a TSH  and hemoglobin A1c on him today.     Bruce Elvera Lennox Juanda Chance, MD, [facility]  Electronically Signed    BRB/MedQ  DD: 12/21/2008  DT: 12/21/2008  Job #: [id number]

## 2011-04-15 NOTE — Assessment & Plan Note (Signed)
[facility] HEALTHCARE                            CARDIOLOGY OFFICE NOTE   [name]                      MRN:          [id number]  DATE:06/28/2008                            DOB:          [birth year]    PRIMARY CARE PHYSICIAN:  Kyle Levins, MD   CLINICAL HISTORY:  [name] came in for followup management of his  atrial fibrillation.  He was in [place] and had an episode in the  morning where he felt like his heart was without a rhythm and went to  the emergency room where he was found to be in atrial fibrillation.  Apparently, he converted there in his rate and he increased his  amiodarone from 100 to 200 mg a day.  [name] says that in the morning  when he gets up he feels that his heart is irregular and slow about 48.  After he walks around, then his heart finally gets back in rhythm.   He has had no other cardiac symptoms.   PAST MEDICAL HISTORY:  Significant for the problems below.   CURRENT MEDICATIONS:  Coumadin, Colace, aspirin, Synthroid, Norvasc,  simvastatin, and amiodarone now 200 mg daily.   PHYSICAL EXAMINATION:  VITAL SIGNS:  Blood pressure was 127/71, pulse 60  and regular.  NECK:  There is no venous distension.  The carotid pulses were full  without bruits.  CHEST:  Clear.  HEART:  Rhythm was regular.  No murmurs or gallops.  ABDOMEN:  Soft, normal bowel sounds.  EXTREMITIES:  Distal pulses were full.  There is no peripheral edema.   We interrogated the pacemaker and the last clearing was in Apr 12, 2008,  and surprisingly I did not see any episodes of fast atrial rates.  He is  programmed to AAI mode.   IMPRESSION:  1. Palpitations, probably indicative of paroxysmal atrial      fibrillation.  2. Sick sinus syndrome with history of paroxysmal atrial fibrillation,      controlled on amiodarone.  3. Status post generator change with a Cypher DDD generator programmed      to AAI.  4. History of transient ischemic  attack.  5. Hyperlipidemia.  6. Good left ventricular function.   RECOMMENDATIONS:  We paced [name] in AV pacing and he did not feel any  abnormal symptoms, so I think we can put him back in DDD mode with mode  switch.  We will put a long AV delay of 350, so he will use his own  intrinsic conduction.  If he does go into atrial fib in the morning,  then he should pace his ventricular at a rate of 60 and hopefully this  will decrease his  symptoms.  We will also give the amiodarone some more time to work in  controlling his atrial fibrillation.  I will plan to see him back in  several weeks depending on his response.     Kyle Elvera Lennox Juanda Chance, MD, [facility]  Electronically Signed    BRB/MedQ  DD: 06/28/2008  DT: 06/29/2008  Job #: [id number]

## 2011-04-15 NOTE — Cardiovascular Report (Signed)
[name], [name]               ACCOUNT NO.:  [id number]   MEDICAL RECORD NO.:  [id number]          PATIENT TYPE:  OIB   LOCATION:  2852                         FACILITY:  [facility]   PHYSICIAN:  Everardo Beals. Juanda Chance, MD, FACCDATE OF BIRTH:  May 12, 1924   DATE OF PROCEDURE:  10/04/2007  DATE OF DISCHARGE:                            CARDIAC CATHETERIZATION   PACEMAKER GENERATOR CHANGE NOTE:   CLINICAL HISTORY:  [name] is 77 years old and has sick sinus syndrome  with paroxysmal atrial fibrillation which is controlled on amiodarone.  He had a Synchrony II pacemaker implanted on October 01, 1995 and  recently reached the ERI.  He is not pacer dependent.   PROCEDURE:  Explantation.   INDICATIONS:  ERI of old Synchrony II DDD generator.   PROCEDURE:  Explantation of the old Synchrony II DDD pacemaker (model  number 2022L, serial number [id number], date of implant October 01, 1995)  and inspection of the old atrial lead (model number 1188T/46, serial  number [id number], date of implant October 01, 1995), inspection of the old  ventricular lead (model 1246T/52, serial number [id number], date of  implant October 01, 1995) and implantation of a new Zephyr XL DDDR  pacemaker (model B062706, serial number [id number]).   ANESTHESIA:  One percent local Xylocaine.   ESTIMATED BLOOD LOSS:  Less than 10 cc.   COMPLICATIONS:  None.   PROCEDURE:  The procedure was performed in room #5.  The right anterior  chest was prepped and draped in the usual fashion.  The skin and  subcutaneous tissue were anesthetized with 1% local Xylocaine.  An  incision was made over the old pacemaker pocket and extended to the  pocket.  The pocket was opened and the old generator was removed.  The  leads were inspected with good parameters described below.  The pocket  was irrigated with sterile Kanamycin solution.  The previous leads were  attached to the new generator was a hex nut wrench.  The generator was  implanted into the  pocket.  The subcutaneous tissue was closed with  running 2-0 Vicryl.  The skin was closed with running 4-0 Vicryl.   ATRIAL LEAD PARAMETERS:  P-wave 1.4 mV.  Minimum threshold capture 1.3  volts with a pulse width of 0.5.  Impedance 604 ohms.   VENTRICULAR LEAD:  R wave 26 mV.  Minimum threshold capture 0.8 volts at  a pulse width of 0.5.  Impedance 623 ohms.   The patient tolerated the procedure well and left the laboratory in  satisfactory condition.      Bruce Elvera Lennox Juanda Chance, MD, [facility]  Electronically Signed     BRB/MEDQ  D:  10/04/2007  T:  10/04/2007  Job:  [id number]   cc:   Cardiopulmonary Lab

## 2011-04-15 NOTE — Assessment & Plan Note (Signed)
[facility] HEALTHCARE                            CARDIOLOGY OFFICE NOTE   Kyle, Donaldson                      MRN:          [id number]  DATE:10/27/2007                            DOB:          Feb 27, 1924    PRIMARY CARE PHYSICIAN:  Dr. Corwin Levins.   CLINICAL COURSE:  [name] returns for follow-up management of his atrial  fibrillation, and following a recent generator change.  He has sick  sinus syndrome with paroxysmal atrial fibrillation.  Recently his  pacemaker reached ERI and we brought him in for a generator change.  We  implanted a new Zephyr DDD pacemaker.  He has done quite well with that;  however, [name] has developed increasing blood pressure and he was  started in our Coumadin Clinic by Shelby Dubin, PharmD with metoprolol  and then subsequently saw Dr. Corwin Levins, at which time his pressure  was under good control; however, since that time his pressures have been  up and down.  They are generally up in the morning in the range of 160  systolic, but in the afternoon and evening they are down to 120  systolic.  He also developed some hematuria and Dr. Jonny Ruiz referred him to  Dr. Courtney Paris, who did a CT scan on him, which was pretty  unremarkable.  He has had no recent chest pain, shortness of breath or  palpitations.   PAST MEDICAL HISTORY:  1. Significant for a history of previous TIAs.  2. Hyperlipidemia.  3. He recently became intolerant of ZOCOR and we switched him to      pravastatin.   PHYSICAL EXAMINATION:  VITAL SIGNS:  Today the blood pressure is 164/81,  pulse 58 and regular.  NECK:  There is no jugular venous distention.  The carotids were full  without bruits.  CHEST:  Clear.  HEART:  Rhythm regular.  No murmurs, rubs or gallops.  ABDOMEN:  Soft, with normal bowel sounds.  EXTREMITIES:  Peripheral pulses full.  There is no peripheral edema.   Electrocardiogram showed atrial pacing with minor nonspecific ST-T  changes.   IMPRESSION:  1. Sick sinus syndrome with paroxysmal atrial fibrillation, not      controlled on amiodarone.  2. Status post recent generator change with a Zephyr DDD generator.  3. Hypertension of recent onset, somewhat labile.  4. History of transient ischemic attack.  5. Hyperlipidemia.  6. Good left ventricular function.   RECOMMENDATIONS:  I think [name] is doing well from the standpoint of  his heart.  His blood pressure has been somewhat labile and I told him  he probably needs some more time to see how this will settle in.  We are  not sure if metoprolol will be the best choice for drugs or not.  I told  him if his blood pressure is difficult to control, and since it is  recent in onset, we want to possibly evaluate him for renal artery  stenosis.  He just had a CT of the abdomen with contrast, but there is  no mention of his renal arteries, and  will check and see if that was  evaluated.   FOLLOWUP:  I will plan to see him back in followup in four months.     Bruce Elvera Lennox Juanda Chance, MD, [facility]  Electronically Signed    BRB/MedQ  DD: 10/27/2007  DT: 10/27/2007  Job #: [id number]

## 2011-04-15 NOTE — Assessment & Plan Note (Signed)
[name]                            CARDIOLOGY OFFICE NOTE   [name], [name]                      MRN:          [id number]  DATE:04/12/2008                            DOB:          May 04, 1924    PRIMARY CARE PHYSICIAN:  Dr. Oliver Barre.   CLINICAL HISTORY:  [name] is 77 years old and returns for  management of his atrial fibrillation and pacemaker.  He has sick sinus  syndrome, and has had a history of atrial fibrillation which is managed  with amiodarone.  He recently had a generator change with a St. Jude  Zephyr pacemaker placed.  Last time we saw him he was having a fair  amount of ventricular pacing, and we reprogrammed to AAI which has made  a big difference.  He says he now feels quite well.   He also has hypertension and was having quite a bit of difficulty with  this.  We put him on Norvasc 5 mg 1/2 tablet alternating with a whole  tablet every other day.  This has helped his blood pressure a lot,  although he says sometimes it gets down into the 90s and feels a little  bit weak and dizzy.   PAST MEDICAL HISTORY:  Past medical history  is significant for TIAs and  hyperlipidemia.   He became intolerant to [facility] and is currently on pravastatin.   CURRENT MEDICATIONS:  1. Amiodarone 100 mg daily.  2. Coumadin.  3. Colace.  4. Aspirin.  5. Levothyroxine.  6. Pravastatin 20 mg daily.  7. Norvasc 5 mg 1/2 tablet alternating with a whole tablet.   On examination, the blood pressure was 103/65 and pulse 65 and regular.  There was no venous distension.  The carotid pulses were full without  bruits.  The chest was clear.  The cardiac rhythm was regular.  I could hear no murmurs or gallops.  The abdomen is soft with normal bowel sounds.  Peripheral pulses were full and there is no peripheral edema.   We interrogated his pacemaker, and he had good threshold on the atrial  channel.  He was atrial pacing 78% of the time.   IMPRESSION:  1. Sick sinus syndrome with paroxysmal atrial fibrillation, controlled      on amiodarone.  2. Status post generator change with a Zephyr DDD generator now      programmed to AAI.  3. History of transient ischemic attack.  4. Hyperlipidemia.  5. Good left ventricular function.   RECOMMENDATIONS:  I think Eeshan is doing quite well at present.  He  says he feels the best he has in sometime, and is even back to playing  tennis and has a regular match once a week.  He is also having lunch  with the old tennis group on Wednesdays every other week.  Will continue  his current medications and will see him back in 6 months.     Bruce Elvera Lennox Juanda Chance, MD, [facility]  Electronically Signed    BRB/MedQ  DD: 04/12/2008  DT: 04/12/2008  Job #: [id number]

## 2011-04-15 NOTE — Discharge Summary (Signed)
[name], [name]               ACCOUNT NO.:  [id number]   MEDICAL RECORD NO.:  [id number]          PATIENT TYPE:  OIB   LOCATION:  2852                         FACILITY:  [facility]   PHYSICIAN:  Everardo Beals. Juanda Chance, MD, FACCDATE OF BIRTH:  1924-05-13   DATE OF ADMISSION:  10/04/2007  DATE OF DISCHARGE:  10/04/2007                               DISCHARGE SUMMARY   FINAL DIAGNOSES:  1. Pacemaker Synchrony II DDD, at end of life, implanted September 19, 1995.  2. Discharging November 3 status post implant of St. Jude Zephyr XL      DDR dual-chamber pacemaker, Dr. Charlies Constable.  3. History of sick sinus syndrome.   SECONDARY DIAGNOSES:  1. Paroxysmal atrial fibrillation controlled with amiodarone.  2. Recent transient ischemic attack (TIA) felt to be a posterior      circulation TIA with symptoms of vertigo.  3. Dyslipidemia.  4. Good left ventricular function.  5. Chronic Coumadin therapy.   PROCEDURE:  October 04, 2007, explant existing St. Jude [place] II  pacemaker with implant of St. Jude Zephyr XL DDR dual-chamber pacemaker,  Dr. Charlies Constable.  Apparently the patient had pacer pocket infection  after implant in the past, and he will go home on Keflex therapy for 5  days.   BRIEF HISTORY:  Mr. Rivkin is 77 years old. He is found to be at  elective replacement indicator on his pacemaker.  He has sick sinus  syndrome.  His original pacemaker was implanted September 19, 1995.  He  returns to the office for assessment and admission to [facility] for pacer generator change.  The patient will continue Coumadin  right on through the generator change scheduled now for Monday, November  3.   HOSPITAL COURSE:  The patient presented electively November 3. He  underwent explantation of his existing pacemaker with implant of a new  St. Jude Zephyr XL DR dual-chamber pacemaker, Dr. Charlies Constable.  He has  had no postprocedural complications such as erythema or hematoma.  He  discharges November 3 on the following medications:  1. Keflex 500 mg, a new medication, 1 tablet 1/2 hour before      breakfast, lunch, dinner and at bedtime for 5 days.  2. Amiodarone 100 mg daily.  3. Coumadin 5 mg Tuesday, Wednesday, Monday and 2.5 mg Thursday,      Saturday, Sunday, Friday.  4. Colace as needed.  5. Enteric-coated aspirin 81 mg daily.  6. Synthroid 25 mcg daily.  7. Pravastatin 40 mg daily at bedtime.   Mr. Champine is asked to remove the bandage over his incision Tuesday,  November 4, and leave the incision open to the air.  He is to keep his  incision dry for the next 7 days and to sponge bathe until Monday,  November 10.  Followup is at [facility], [address]  1. Pacer clinic Monday, November 17, at 9:20 to check the incision.  2. Coumadin Clinic Monday, November 24, at 11:30.  3. To see Dr. Juanda Chance Wednesday, November 26, at 11:30.  Appropriate laboratories prior to the procedure have been taken and  reviewed.      Maple Mirza, PA      Bruce R. Juanda Chance, MD, [facility]  Electronically Signed    GM/MEDQ  D:  10/04/2007  T:  10/04/2007  Job:  [id number]   cc:   Corwin Levins, MD

## 2011-04-16 ENCOUNTER — Encounter (INDEPENDENT_AMBULATORY_CARE_PROVIDER_SITE_OTHER): Payer: Medicare Other | Admitting: Vascular Surgery

## 2011-04-16 DIAGNOSIS — M79609 Pain in unspecified limb: Secondary | ICD-10-CM

## 2011-04-16 DIAGNOSIS — T567X4A Toxic effect of beryllium and its compounds, undetermined, initial encounter: Secondary | ICD-10-CM

## 2011-04-17 NOTE — Assessment & Plan Note (Signed)
OFFICE VISIT  SKY, BORBOA DOB:  [birth year]                                       04/16/2011 WNUUV#:[id number]  I saw patient as an add-on today with a wound on his right pretibial area.  This is a pleasant 77 year old gentleman who injured his right leg on a lawn mower approximately 1 week ago and had a tear in the skin and significant bruising.  Of note, he is on Coumadin for a history of atrial fibrillation.  He had been doing warm compresses on the wound for about 5 days and then was started on p.o. Cipro and was recently changed to Bactrim.  He has not had any significant drainage from the wound.  He has significant swelling and pain associated with the wound.  This has been very slow to heal, and we were asked to evaluate his vascular status.  His past medical history is fairly unremarkable.  He denies any history of diabetes, hypertension, hypercholesterolemia, history of previous myocardial infarction, history of congestive heart failure or history of COPD.  He does have a history of atrial fibrillation and is on Coumadin.  SOCIAL HISTORY:  He is married.  He has 4 children.  He is retired.  He quit tobacco in 1977.  FAMILY HISTORY:  He is unaware of any history of premature cardiovascular disease.  REVIEW OF SYSTEMS:  CARDIOVASCULAR:  He has had no chest pain, chest pressure.  He does have a history of palpitations.  He has had no dyspnea on exertion or orthopnea.  He had a stroke in 1995.  He has had no history of DVT. NEUROLOGIC:  He has occasional dizziness.  PHYSICAL EXAMINATION:  This is a pleasant 77 year old gentleman who appears his stated age.  Blood pressure is 125/75, heart rate is 59, respiratory rate is 18.  Lungs are clear bilaterally to auscultation. Cardiovascular:  I do not detect any carotid bruits.  He has a regular rate and rhythm.  He has palpable femoral, popliteal, dorsalis pedis, and posterior tibial pulses  bilaterally.  He has swelling in the right pretibial area underlying the laceration.  I did gently probe this with a Q-tip but was not aggressive with this.  I think he probably has an underlying hematoma.  I did not want to probe into this and then be left with a deep wound.  I did independently interpret his arterial Doppler study which shows biphasic Doppler signals in the posterior tibial and anterior tibial positions bilaterally with normal ABIs.  I think he has adequate circulation to heal this wound, but I think this will take some time, as he is on Coumadin.  He may have an underlying hematoma, which hopefully will gradually absorb.  He has some mild cellulitis, and he is on Bactrim, which I think should continue for now. I have instructed him to try to limit his activity and elevate his leg until the pain has improved.  I will be happy to see him back if this fails to heal, but I think this will gradually heal although it may take several weeks.    Di Kindle. Edilia Bo, M.D. Electronically Signed  CSD/MEDQ  D:  04/16/2011  T:  04/17/2011  Job:  [id number]

## 2011-04-18 NOTE — Assessment & Plan Note (Signed)
[facility]                            CARDIOLOGY OFFICE NOTE   ERICBERTO, PADGET                      MRN:          [id number]  DATE:11/09/2006                            DOB:          10-15-1924    PRIMARY CARE PHYSICIAN:  Corwin Levins, MD.   CLINICAL HISTORY:  [name] is 77 years old and has sick sinus syndrome  with paroxysmal atrial fibrillation which is being controlled with  amiodarone and a Synchrony II DDD pacemaker.  He has done quite well  with this and has had no recent symptoms of palpitations and has had no  chest pain or shortness of breath.   He has been out playing some tennis and has had no symptoms related to  this.   PAST MEDICAL HISTORY:  Significant for:  1. Hyperlipidemia.  2. Previous TIA.  3. Mild carotid stenosis by Dopplers.   CURRENT MEDICATIONS:  Include Amiodarone, Zocor, Coumadin, stool  softener, and aspirin.   PHYSICAL EXAMINATION:  VITAL SIGNS: Blood pressure 115/60, pulse 60 and  regular.  NECK:  No vein distention.  Carotid pulses were full. I could hear no  definite bruits.  CHEST: Clear.  HEART:  Rhythm was regular.  No murmurs or gallops.  ABDOMEN: Soft with normal bowel sounds.  No hepatosplenomegaly.  EXTREMITIES: Peripheral pulses full with no peripheral edema.   IMPRESSION:  1. Sick sinus syndrome with paroxysmal atrial fibrillation controlled      with amiodarone.  2. Status post Synchrony II DDD pacemaker implantation, not pacer      dependent.  3. History of transient ischemic attack on Coumadin therapy.  (We have      bridged in the past for interruption of Coumadin.)  4. Hyperlipidemia.  5. Mild carotid stenosis.   RECOMMENDATIONS:  I think Mr. Mohl is doing very well.  His last lipid  profile was not at target since he has carotid disease, and we have  classified him as secondary prevention.  He would prefer not to go up on  his statin medication and work harder on his diet.  His  TSH was  slightly elevated, and we will have to keep an eye on that.  He is on  just 100 of amiodarone.  We will plan to see him in 6 months and will  get a lipid level, TSH, BMP, and CBC.   I also talked to him about the ROCKET study and encouraged him to  consider that, and we will have our research nurses talk with him today.  We will plan to see him in 6 months.   ADDENDUM:  He may be due for an echocardiogram and carotid Dopplers  after the 89-month visit.     Bruce Elvera Lennox Juanda Chance, MD, [facility]  Electronically Signed    BRB/MedQ  DD: 11/09/2006  DT: 11/09/2006  Job #: [id number]

## 2011-04-18 NOTE — Letter (Signed)
January 06, 2007    Corwin Levins, MD  [address]  [place]  [place] [address]   RE:  CESARE, SUMLIN  MRN:  [id number]  /  DOB:  07-22-1924   Dear Rosanne Ashing,   I would like to let you know that we have seen Mr. Madara at your  request for evaluation of pill dysphagia.  After seeing him in the  office we have scheduled him for barium swallow with thin esophagram.  I  have discussed the results with Mr. Ausborn today.  He is having problems  with asymptomatic aspiration of the barium.  He had silent aspiration,  as well as clinically apparent aspiration.  There was no Zenker's  diverticulum.  He is having a problems with the full closure of his  glottis.  The 13-mm barium tablet did lodge in the pyriform sinus but he  was able to evacuate it with sip of water.  Overall, I think he has  serious neurogenic dysphagia, most likely related to previous stroke.  His overall esophageal motility was satisfactory and there was no  significant reflux, and there was no stricture, therefore upper  endoscopic exam or dilation would not likely be of any benefit.   I have talked to Mr. Tillett today about it and we will arrange for him  to see a speech pathologist and discuss techniques of swallowing to  reduce the incidence of aspiration.  I suspect this is a chronic problem  and this may result in serious respiratory problems in the future.  I am  sending the report also to his neurologist Dr. Genene Churn. Love.  I have  not mentioned to Mr. Auxier possibility of gastrostomy or any other  alternative treatments since he is in many  ways a normal functioning elderly gentleman who enjoys his meals and  social life.  Gastrostomy would mean a severe restriction in his quality  of life.  Please let me know if you have any opinion about this problem.  He will be seeing speech pathologist in the near future.  Thanks for  allowing me to share in his care.    Sincerely,      Hedwig Morton. Juanda Chance, MD  Electronically Signed    DMB/MedQ  DD: 01/06/2007  DT: 01/06/2007  Job #: [id number]

## 2011-04-18 NOTE — Assessment & Plan Note (Signed)
[facility]                         GASTROENTEROLOGY OFFICE NOTE   Kyle, Donaldson                      MRN:          [id number]  DATE:01/01/2007                            DOB:          11-14-1924    [name] is a very nice 77 year old gentleman who has problems with  swallowing pills. For the past 2 years, he has experienced difficulty in  swallowing his multivitamin. The rest of the medications, amiodarone 100  mg daily, Zocor 20 mg p.o. daily, Coumadin 2.5 alternating 5 mg p.o.  daily, stool softener and aspirin. He has not had any problems with the  rest of the medications. He also denies any dysphagia to solids or  liquids except for Raisin Bran and sometimes lettuce which he has to  cough up or regurgitate. He denies any odynophagia. The symptoms do not  seem to be progressive, they occur about 3 times a week. There is no  hoarseness or no coughing with food. The patient has never had any GI  evaluation except for colonoscopy by Dr. Kinnie Scales in 1999 which showed  normal flexible sigmoidoscopy of the left colon.   PAST MEDICAL HISTORY:  Significant for heart rhythm problems followed by  Dr. Charlies Constable, stroke in 1995, atrial fibrillation, he has a  pacemaker.   SOCIAL HISTORY:  Married, he has children, works for [employer]. The patient does not smoke and does not drink alcohol.   REVIEW OF SYSTEMS:  Positive for hearing aid, heart murmur.   PHYSICAL EXAMINATION:  VITAL SIGNS:  Blood pressure 132/64, pulse 60 and  weight 160 pounds.  GENERAL:  He was alert and oriented, somewhat hard of hearing.  HEENT:  Neck supple. Thyroid was not enlarged. No scars on the neck, no  bruit. Voice was normal. Oral cavity was normal.  LUNGS:  Clear to auscultation.  CHEST:  Did not show any point tenderness or lymphadenopathy.   IMPRESSION:  An 77 year old white male with pill dysphagia suggestive of  upper esophageal sphincter  dysfunction, rule out Zenker's diverticulum,  rule out esophageal spasm. His symptoms are not suggestive of esophageal  stricture or hiatal hernia. He also does not have any symptoms  suggestive of aspiration.   PLAN:  1. Barium swallow with cine-esophagram. Rule out Zenker's      diverticulum.  2. Avoid foods that cause choking, also avoid multivitamins which      cause choking. Depending on the results of the cine-esophagram, he      may need an adjustment in his diet. I doubt that      he would need an upper endoscopy and dilatation since his symptoms      are not typical of anatomic obstruction.     Hedwig Morton. Juanda Chance, MD  Electronically Signed    DMB/MedQ  DD: 01/01/2007  DT: 01/01/2007  Job #: [id number]   cc:   Corwin Levins, MD

## 2011-05-08 ENCOUNTER — Encounter: Payer: Medicare Other | Admitting: Internal Medicine

## 2011-05-12 ENCOUNTER — Encounter: Payer: Medicare Other | Admitting: *Deleted

## 2011-05-14 ENCOUNTER — Encounter: Payer: Medicare Other | Admitting: *Deleted

## 2011-05-15 ENCOUNTER — Ambulatory Visit (INDEPENDENT_AMBULATORY_CARE_PROVIDER_SITE_OTHER): Payer: Medicare Other | Admitting: *Deleted

## 2011-05-15 DIAGNOSIS — I4891 Unspecified atrial fibrillation: Secondary | ICD-10-CM

## 2011-05-20 ENCOUNTER — Encounter (INDEPENDENT_AMBULATORY_CARE_PROVIDER_SITE_OTHER): Payer: Self-pay | Admitting: Surgery

## 2011-06-16 ENCOUNTER — Ambulatory Visit (INDEPENDENT_AMBULATORY_CARE_PROVIDER_SITE_OTHER): Payer: Medicare Other | Admitting: *Deleted

## 2011-06-16 DIAGNOSIS — I4891 Unspecified atrial fibrillation: Secondary | ICD-10-CM

## 2011-06-16 LAB — POCT INR: INR: 2.2

## 2011-06-19 ENCOUNTER — Other Ambulatory Visit: Payer: Self-pay

## 2011-06-19 MED ORDER — WARFARIN SODIUM 5 MG PO TABS: ORAL_TABLET | ORAL | Status: DC

## 2011-06-20 ENCOUNTER — Encounter: Payer: Self-pay | Admitting: Internal Medicine

## 2011-06-20 DIAGNOSIS — I495 Sick sinus syndrome: Secondary | ICD-10-CM

## 2011-07-15 ENCOUNTER — Ambulatory Visit (INDEPENDENT_AMBULATORY_CARE_PROVIDER_SITE_OTHER): Payer: Medicare Other | Admitting: *Deleted

## 2011-07-15 DIAGNOSIS — I4891 Unspecified atrial fibrillation: Secondary | ICD-10-CM

## 2011-07-15 LAB — POCT INR: INR: 2.5

## 2011-08-05 ENCOUNTER — Ambulatory Visit: Payer: Medicare Other | Admitting: Physician Assistant

## 2011-08-13 ENCOUNTER — Ambulatory Visit (INDEPENDENT_AMBULATORY_CARE_PROVIDER_SITE_OTHER): Payer: Medicare Other | Admitting: *Deleted

## 2011-08-13 DIAGNOSIS — I4891 Unspecified atrial fibrillation: Secondary | ICD-10-CM

## 2011-08-13 LAB — POCT INR: INR: 1.6

## 2011-08-27 ENCOUNTER — Ambulatory Visit (INDEPENDENT_AMBULATORY_CARE_PROVIDER_SITE_OTHER): Payer: Medicare Other | Admitting: Physician Assistant

## 2011-08-27 ENCOUNTER — Encounter: Payer: Self-pay | Admitting: Physician Assistant

## 2011-08-27 ENCOUNTER — Ambulatory Visit (INDEPENDENT_AMBULATORY_CARE_PROVIDER_SITE_OTHER): Payer: Medicare Other | Admitting: *Deleted

## 2011-08-27 ENCOUNTER — Encounter: Payer: Self-pay | Admitting: Internal Medicine

## 2011-08-27 ENCOUNTER — Other Ambulatory Visit: Payer: Self-pay

## 2011-08-27 ENCOUNTER — Encounter: Payer: Medicare Other | Admitting: *Deleted

## 2011-08-27 VITALS — BP 108/61 | HR 72 | Ht 69.0 in | Wt 150.8 lb

## 2011-08-27 DIAGNOSIS — R42 Dizziness and giddiness: Secondary | ICD-10-CM

## 2011-08-27 DIAGNOSIS — I495 Sick sinus syndrome: Secondary | ICD-10-CM

## 2011-08-27 DIAGNOSIS — I4891 Unspecified atrial fibrillation: Secondary | ICD-10-CM

## 2011-08-27 DIAGNOSIS — R0602 Shortness of breath: Secondary | ICD-10-CM

## 2011-08-27 LAB — PACEMAKER DEVICE OBSERVATION
AL AMPLITUDE: 0.7 mv
AL IMPEDENCE PM: 339 Ohm
AL THRESHOLD: 0.5 V
RV LEAD AMPLITUDE: 12 mv
RV LEAD IMPEDENCE PM: 476 Ohm

## 2011-08-27 LAB — POCT INR: INR: 2.2

## 2011-08-27 NOTE — Patient Instructions (Addendum)
Call [phone] [facility]) and tell them you want to schedule a "Free Balance Screen."  Your physician has requested that you have a lexiscan myoview DX [phone] SHORTNESS OF BREATH. For further information please visit [email or web address] Please follow instruction sheet, as given.  Your physician recommends that you schedule a follow-up appointment in: 4-6 WEEKS WITH DR. ALLRED AFTER PT HAS HAD LEXISCAN   WEAR COMPRESSION STOCKING S EVERY DAY PER SCOTT WEAVER, PA-C  NO OTHER CHANGES AT THIS TIME

## 2011-08-27 NOTE — Assessment & Plan Note (Signed)
Normal pacer function today.  Follow up with Dr Johney Frame next month as planned.

## 2011-08-27 NOTE — Progress Notes (Signed)
History of Present Illness: Primary Electrophysiologist:  Dr. Hillis Range   [name] is a 77 y.o. male who walked in to clinic today with dyspnea and dizziness and was added on to my scheduled.  He has a h/o parox AFib, controlled on amiodarone, coumadin therapy, SSS, s/p pacer, HTN, HLP, hypothyroidism, neuropathy, ataxia (followed by Dr. Sandria Manly) and prior TIA.  Last seen by Dr. Johney Frame in 4/12.  Amiodarone decreased to 50 mg QD to reduce side effects.  He was in NSR at that time.  Interrogation of his pacer today demonstrated normal function, no AFib and no mode switches.    He describes increased shortness of breath over the last 6 months.  He feels more tired.  He denies chest discomfort.  He denies jaw pain.  He denies orthopnea, PND or edema.  He has chronic problems with his balance.  He describes dizziness.  His dizziness was worse this morning.  He measured his blood pressure while lying down and it was 90/50.  He denies syncope or near-syncope.  He did see an ear nose and throat doctor some years ago and was told he should see physical therapy for his balance issues.  He is followed by Dr. Sandria Manly for neuropathy and ataxia.  Past Medical History  Diagnosis Date  . Atrial fibrillation     coumadin Rx; Echo 10/11: EF 60%, grade 1 diast dysfxn, mild AI, mild MR, aortic sclerosis, upper septal thickened without LVOT or SAM;  CLite 4/09: no ischemia  . Sick sinus syndrome     s/p PPM  . Ataxia     followed by Dr. Sandria Manly  . Hyperlipidemia   . Hypothyroidism   . BPH (benign prostatic hyperplasia)   . Hypertension   . TIA (transient ischemic attack)     carotid dopplers 5/11: 0-39% bilateral; follow up due 03/2012    Current Outpatient Prescriptions  Medication Sig Dispense Refill  . amiodarone (PACERONE) 100 MG tablet Take 100 mg by mouth daily.        Marland Kitchen aspirin 81 MG tablet Take 81 mg by mouth daily.        Marland Kitchen docusate sodium (COLACE) 100 MG capsule Take 100 mg by mouth daily.        Marland Kitchen  levothyroxine (SYNTHROID, LEVOTHROID) 25 MCG tablet Take 25 mcg by mouth daily.        Marland Kitchen LORazepam (ATIVAN) 1 MG tablet Take 0.5 mg by mouth as needed.        . Multiple Vitamin (MULTIVITAMIN) tablet Take 1 tablet by mouth daily.        . simvastatin (ZOCOR) 20 MG tablet Take 20 mg by mouth at bedtime.        Marland Kitchen warfarin (COUMADIN) 5 MG tablet Take as directed by Anticoagulation clinic   90 tablet  0    Allergies: Allergies  Allergen Reactions  . Mucinex     Pt can not take mucinex PM    Social history:  Ex smoker  ROS:  Please see the history of present illness.  All other systems reviewed and negative.   Vital Signs: BP 108/61  Pulse 72  Ht 5\' 9"  (1.753 m)  Wt 150 lb 12.8 oz (68.402 kg)  BMI 22.27 kg/m2  Filed Vitals:   08/27/11 1353 08/27/11 1415 08/27/11 1416 08/27/11 1417  BP: 122/80 125/73 99/60 108/61  Pulse: 85 60 65 72  Height: 5\' 9"  (1.753 m)     Weight: 150 lb 12.8 oz (68.402 kg)  PHYSICAL EXAM: Well nourished, well developed, in no acute distress HEENT: normal Neck: no JVD Cardiac:  normal S1, S2; RRR; no murmur Lungs:  clear to auscultation bilaterally, no wheezing, rhonchi or rales Abd: soft, nontender, no hepatomegaly Ext: no edema Skin: warm and dry Neuro:  CNs 2-12 intact, no focal abnormalities noted Psych: Normal affect  EKG:  Atrial paced, heart rate 60, nonspecific ST-T wave changes, no significant change when compared to prior tracing dated 08/20/10  ASSESSMENT AND PLAN:

## 2011-08-27 NOTE — Progress Notes (Signed)
PPM check 

## 2011-08-27 NOTE — Assessment & Plan Note (Signed)
Etiology unclear.  Obtain Lexiscan Myoview to rule out ischemia.  Follow up with Dr. Johney Frame in October as previously scheduled.

## 2011-08-27 NOTE — Assessment & Plan Note (Signed)
He does have a drop in his blood pressure from lying to sitting to standing.  This quickly recovers and he does not have an increase in his heart rate.  He likely has autonomic dysfunction from his neuropathy.  I have asked him to practice safety with getting from a lying position to a standing position.  He has compression stockings at home.  I have asked him to wear these every day.  He has lots of problems with his balance.  I have asked him to pursue a free balance screening at [facility] outpatient neuro rehabilitation.

## 2011-08-27 NOTE — Assessment & Plan Note (Signed)
Maintaining NSR.  Continue coumadin.

## 2011-09-03 ENCOUNTER — Ambulatory Visit: Payer: Medicare Other | Admitting: Rehabilitative and Restorative Service Providers"

## 2011-09-04 ENCOUNTER — Ambulatory Visit: Payer: Medicare Other | Admitting: Physical Therapy

## 2011-09-08 ENCOUNTER — Ambulatory Visit (HOSPITAL_COMMUNITY): Payer: Medicare Other | Attending: Internal Medicine | Admitting: Radiology

## 2011-09-08 VITALS — Ht 69.0 in | Wt 150.0 lb

## 2011-09-08 DIAGNOSIS — R0602 Shortness of breath: Secondary | ICD-10-CM | POA: Insufficient documentation

## 2011-09-08 DIAGNOSIS — I4891 Unspecified atrial fibrillation: Secondary | ICD-10-CM

## 2011-09-08 DIAGNOSIS — R0989 Other specified symptoms and signs involving the circulatory and respiratory systems: Secondary | ICD-10-CM

## 2011-09-08 MED ORDER — TECHNETIUM TC 99M TETROFOSMIN IV KIT
11.0000 | PACK | Freq: Once | INTRAVENOUS | Status: AC | PRN
  Administered 2011-09-08: 11 via INTRAVENOUS

## 2011-09-08 MED ORDER — REGADENOSON 0.4 MG/5ML IV SOLN
0.4000 mg | Freq: Once | INTRAVENOUS | Status: AC
  Administered 2011-09-08: 0.4 mg via INTRAVENOUS

## 2011-09-08 MED ORDER — TECHNETIUM TC 99M TETROFOSMIN IV KIT
33.0000 | PACK | Freq: Once | INTRAVENOUS | Status: AC | PRN
  Administered 2011-09-08: 33 via INTRAVENOUS

## 2011-09-08 NOTE — Progress Notes (Signed)
[facility] SITE 3 NUCLEAR MED [address] [place] [address] [phone]  Cardiology Nuclear Med Study  [name] is a 77 y.o. male [id number] [birth year]   Nuclear Med Background Indication for Stress Test:  Evaluation for Ischemia History:  '96 PTVP with Generator Change in '08; '09 VHQ:IONGEX, EF=69%; '11 Echo:EF=60%, Mild AI; H/O PAF Cardiac Risk Factors: Carotid Disease, History of Smoking, Hypertension, Lipids and TIA  Symptoms:  DOE and Fatigue   Nuclear Pre-Procedure Caffeine/Decaff Intake:  9:00pm NPO After: 9:00pm   Lungs:  Clear.  O2 SAT 96% on RA IV 0.9% NS with Angio Cath:  20g  IV Site: R Antecubital  IV Started by:  Bonnita Levan, RN  Chest Size (in):  44 Cup Size: n/a  Height: 5\' 9"  (1.753 m)  Weight:  150 lb (68.04 kg)  BMI:  Body mass index is 22.15 kg/(m^2). Tech Comments:  No medications taken today, per patient.    Nuclear Med Study 1 or 2 day study: 1 day  Stress Test Type:  Treadmill/Lexiscan  Reading MD: Charlton Haws, MD  Order Authorizing Provider:  Hillis Range, MD  Resting Radionuclide: Technetium 54m Tetrofosmin  Resting Radionuclide Dose: 11.0 mCi   Stress Radionuclide:  Technetium 33m Tetrofosmin  Stress Radionuclide Dose: 33.0 mCi           Stress Protocol Rest HR: 60 Stress HR: 89  Rest BP: 132/76 Stress BP: 170/71  Exercise Time (min): 2:00 METS: n/a   Predicted Max HR: 136 bpm % Max HR: 65.44 bpm Rate Pressure Product: 52841   Dose of Adenosine (mg):  n/a Dose of Lexiscan: 0.4 mg  Dose of Atropine (mg): n/a Dose of Dobutamine: n/a mcg/kg/min (at max HR)  Stress Test Technologist: Smiley Houseman, CMA-N  Nuclear Technologist:  Domenic Polite, CNMT     Rest Procedure:  Myocardial perfusion imaging was performed at rest 45 minutes following the intravenous administration of Technetium 21m Tetrofosmin.  Rest ECG: Paced atrial fibrillation.  Stress Procedure:  The patient received IV Lexiscan 0.4 mg  over 15-seconds with concurrent low level exercise and then Technetium 50m Tetrofosmin was injected at 30-seconds while the patient continued walking one more minute.  There were nonspecific T-wave changes with Lexiscan.  Quantitative spect images were obtained after a 45-minute delay.  Stress ECG: No significant change from baseline ECG  QPS Raw Data Images:  Normal; no motion artifact; normal heart/lung ratio. Stress Images:  Normal homogeneous uptake in all areas of the myocardium. Rest Images:  Normal homogeneous uptake in all areas of the myocardium. Subtraction (SDS):  Normal Transient Ischemic Dilatation (Normal <1.22):  0.91 Lung/Heart Ratio (Normal <0.45):  0.24  Quantitative Gated Spect Images QGS EDV:  80 ml QGS ESV:  24 ml QGS cine images:  NL LV Function; NL Wall Motion QGS EF: 69%  Impression Exercise Capacity:  Lexiscan with low level exercise. BP Response:  Normal blood pressure response. Clinical Symptoms:  No chest pain. ECG Impression:  No significant ST segment change suggestive of ischemia. Comparison with Prior Nuclear Study: No images to compare  Overall Impression:  Normal stress nuclear study.       Charlton Haws

## 2011-09-16 ENCOUNTER — Ambulatory Visit: Payer: Medicare Other | Admitting: Rehabilitative and Restorative Service Providers"

## 2011-09-21 ENCOUNTER — Emergency Department (HOSPITAL_COMMUNITY): Payer: Medicare Other

## 2011-09-21 ENCOUNTER — Emergency Department (HOSPITAL_COMMUNITY)
Admission: EM | Admit: 2011-09-21 | Discharge: 2011-09-21 | Disposition: A | Discharge status: Home / Self Care | Payer: Medicare Other | Attending: Emergency Medicine | Admitting: Emergency Medicine

## 2011-09-21 ENCOUNTER — Other Ambulatory Visit (HOSPITAL_COMMUNITY): Payer: Medicare Other

## 2011-09-21 DIAGNOSIS — M542 Cervicalgia: Secondary | ICD-10-CM | POA: Insufficient documentation

## 2011-09-21 DIAGNOSIS — T148XXA Other injury of unspecified body region, initial encounter: Secondary | ICD-10-CM | POA: Insufficient documentation

## 2011-09-21 DIAGNOSIS — Z79899 Other long term (current) drug therapy: Secondary | ICD-10-CM | POA: Insufficient documentation

## 2011-09-21 DIAGNOSIS — I2510 Atherosclerotic heart disease of native coronary artery without angina pectoris: Secondary | ICD-10-CM | POA: Insufficient documentation

## 2011-09-21 DIAGNOSIS — Z7901 Long term (current) use of anticoagulants: Secondary | ICD-10-CM | POA: Insufficient documentation

## 2011-09-21 DIAGNOSIS — Z950 Presence of cardiac pacemaker: Secondary | ICD-10-CM | POA: Insufficient documentation

## 2011-09-21 DIAGNOSIS — R42 Dizziness and giddiness: Secondary | ICD-10-CM | POA: Insufficient documentation

## 2011-09-21 DIAGNOSIS — Y998 Other external cause status: Secondary | ICD-10-CM | POA: Insufficient documentation

## 2011-09-21 DIAGNOSIS — I4891 Unspecified atrial fibrillation: Secondary | ICD-10-CM | POA: Insufficient documentation

## 2011-09-21 DIAGNOSIS — W108XXA Fall (on) (from) other stairs and steps, initial encounter: Secondary | ICD-10-CM | POA: Insufficient documentation

## 2011-09-21 DIAGNOSIS — Z8673 Personal history of transient ischemic attack (TIA), and cerebral infarction without residual deficits: Secondary | ICD-10-CM | POA: Insufficient documentation

## 2011-09-21 DIAGNOSIS — Z7982 Long term (current) use of aspirin: Secondary | ICD-10-CM | POA: Insufficient documentation

## 2011-09-21 DIAGNOSIS — M549 Dorsalgia, unspecified: Secondary | ICD-10-CM | POA: Insufficient documentation

## 2011-09-25 ENCOUNTER — Ambulatory Visit (INDEPENDENT_AMBULATORY_CARE_PROVIDER_SITE_OTHER): Payer: Medicare Other | Admitting: *Deleted

## 2011-09-25 DIAGNOSIS — I4891 Unspecified atrial fibrillation: Secondary | ICD-10-CM

## 2011-09-25 LAB — POCT INR: INR: 1.8

## 2011-10-06 ENCOUNTER — Ambulatory Visit (INDEPENDENT_AMBULATORY_CARE_PROVIDER_SITE_OTHER): Payer: Medicare Other | Admitting: Internal Medicine

## 2011-10-06 ENCOUNTER — Encounter: Payer: Self-pay | Admitting: Internal Medicine

## 2011-10-06 VITALS — BP 126/70 | HR 65 | Wt 149.0 lb

## 2011-10-06 DIAGNOSIS — I4891 Unspecified atrial fibrillation: Secondary | ICD-10-CM

## 2011-10-06 DIAGNOSIS — I495 Sick sinus syndrome: Secondary | ICD-10-CM

## 2011-10-06 DIAGNOSIS — G609 Hereditary and idiopathic neuropathy, unspecified: Secondary | ICD-10-CM

## 2011-10-06 LAB — HEPATIC FUNCTION PANEL
AST: 18 U/L (ref 0–37)
Alkaline Phosphatase: 64 U/L (ref 39–117)
Bilirubin, Direct: 0.1 mg/dL (ref 0.0–0.3)
Total Bilirubin: 0.4 mg/dL (ref 0.3–1.2)

## 2011-10-06 LAB — PACEMAKER DEVICE OBSERVATION
AL AMPLITUDE: 1 mv
AL IMPEDENCE PM: 337 Ohm
BAMS-0003: 70 {beats}/min
BATTERY VOLTAGE: 2.8 V
RV LEAD AMPLITUDE: 12 mv
VENTRICULAR PACING PM: 1

## 2011-10-06 LAB — T4, FREE: Free T4: 1.09 ng/dL (ref 0.60–1.60)

## 2011-10-06 NOTE — Patient Instructions (Signed)
Your physician wants you to follow-up in: 12 months with Dr Jacquiline Doe will receive a reminder letter in the mail two months in advance. If you don't receive a letter, please call our office to schedule the follow-up appointment.  Merlin transmission every 3 months   Your physician recommends that you return for lab work today  Liver, TSH T4

## 2011-10-06 NOTE — Assessment & Plan Note (Signed)
Followed by Dr Sandria Manly He has occasional orthostatic hypotension with symptoms of unsteadiness and dizziness, likely due to neuropathy. He is presently on no diuretics or BP medications. I have recommended salt liberalization and adequate hydration. Lifestyle modification including using a walker and getting up slowly from a recumbent position were discussed. If not improved, he may benefit from low dose florinef.  I will defer this to Dr Sandria Manly.

## 2011-10-06 NOTE — Assessment & Plan Note (Signed)
Normal pacemaker function See Pace Art report No changes today  

## 2011-10-06 NOTE — Progress Notes (Signed)
The patient presents today for routine electrophysiology followup.  Since last being seen in our clinic, the patient reports doing reasonably well.   He continues to have episodic postural dizziness and unsteadiness.  He has had several recent falls due to ataxia.  He denies syncope. Today, he denies symptoms of palpitations, chest pain, shortness of breath, orthopnea, PND, or lower extremity edema.  The patient feels that he is tolerating medications without difficulties and is otherwise without complaint today.   Past Medical History  Diagnosis Date  . Atrial fibrillation     coumadin Rx  . Sick sinus syndrome     s/p PPM  . Ataxia     followed by Dr. Sandria Manly  . Hyperlipidemia   . Hypothyroidism   . BPH (benign prostatic hyperplasia)   . Hypertension   . TIA (transient ischemic attack)     carotid dopplers 5/11: 0-39% bilateral; follow up due 03/2012  . Peripheral neuropathy    Past Surgical History  Procedure Date  . Pacemaker insertion     implanted 10/96, most recent Gen Change by Dr Juanda Chance 8/08    Current Outpatient Prescriptions  Medication Sig Dispense Refill  . amiodarone (PACERONE) 100 MG tablet Take 100 mg by mouth daily.        Marland Kitchen aspirin 81 MG tablet Take 81 mg by mouth daily.        Marland Kitchen docusate sodium (COLACE) 100 MG capsule Take 100 mg by mouth daily.        Marland Kitchen levothyroxine (SYNTHROID, LEVOTHROID) 25 MCG tablet Take 25 mcg by mouth daily.        Marland Kitchen LORazepam (ATIVAN) 1 MG tablet Take 0.5 mg by mouth as needed.        . Multiple Vitamin (MULTIVITAMIN) tablet Take 1 tablet by mouth daily.        . simvastatin (ZOCOR) 20 MG tablet Take 20 mg by mouth at bedtime.        Marland Kitchen warfarin (COUMADIN) 5 MG tablet Take as directed by Anticoagulation clinic   90 tablet  0  . zolpidem (AMBIEN) 10 MG tablet Take 10 mg by mouth at bedtime as needed.          Allergies  Allergen Reactions  . Mucinex     Pt can not take mucinex PM    History   Social History  . Marital Status:  Married    Spouse Name: N/A    Number of Children: N/A  . Years of Education: N/A   Occupational History  . Not on file.   Social History Main Topics  . Smoking status: Former Games developer  . Smokeless tobacco: Not on file  . Alcohol Use: No  . Drug Use: No  . Sexually Active: Not on file   Other Topics Concern  . Not on file   Social History Narrative  . No narrative on file   Physical Exam: Filed Vitals:   10/06/11 1402  BP: 126/70  Pulse: 65  Weight: 149 lb (67.586 kg)    GEN- The patient is elderly appearing, alert and oriented x 3 today.   Head- normocephalic, atraumatic Eyes-  Sclera clear, conjunctiva pink Ears- hearing intact Oropharynx- clear Neck- supple, no JVP Lymph- no cervical lymphadenopathy Lungs- Clear to ausculation bilaterally, normal work of breathing Chest- pacemaker pocket is well healed Heart- Regular rate and rhythm, no murmurs, rubs or gallops, PMI not laterally displaced GI- soft, NT, ND, + BS Extremities- no clubbing, cyanosis, or edema  Pacemaker  interrogation- reviewed in detail today,  See PACEART report  Assessment and Plan:

## 2011-10-06 NOTE — Assessment & Plan Note (Signed)
Maintaining sinus rhythm with amiodarone 100mg  daily Continue coumadin long term Check LFTs and TFTs today  HE will ask PCP to repeat LFTs/TFTs in 6 months

## 2011-10-16 ENCOUNTER — Other Ambulatory Visit: Payer: Self-pay | Admitting: *Deleted

## 2011-10-16 ENCOUNTER — Ambulatory Visit (INDEPENDENT_AMBULATORY_CARE_PROVIDER_SITE_OTHER): Payer: Medicare Other | Admitting: *Deleted

## 2011-10-16 ENCOUNTER — Other Ambulatory Visit: Payer: Self-pay | Admitting: Internal Medicine

## 2011-10-16 DIAGNOSIS — I4891 Unspecified atrial fibrillation: Secondary | ICD-10-CM

## 2011-10-16 MED ORDER — WARFARIN SODIUM 5 MG PO TABS: ORAL_TABLET | ORAL | Status: DC

## 2011-10-22 ENCOUNTER — Other Ambulatory Visit: Payer: Self-pay | Admitting: *Deleted

## 2011-10-22 MED ORDER — WARFARIN SODIUM 5 MG PO TABS: ORAL_TABLET | ORAL | Status: DC

## 2011-11-03 ENCOUNTER — Ambulatory Visit (INDEPENDENT_AMBULATORY_CARE_PROVIDER_SITE_OTHER): Payer: Medicare Other | Admitting: *Deleted

## 2011-11-03 DIAGNOSIS — I4891 Unspecified atrial fibrillation: Secondary | ICD-10-CM

## 2011-11-03 LAB — POCT INR: INR: 2.4

## 2011-12-01 ENCOUNTER — Ambulatory Visit (INDEPENDENT_AMBULATORY_CARE_PROVIDER_SITE_OTHER): Payer: Medicare Other | Admitting: *Deleted

## 2011-12-01 DIAGNOSIS — I4891 Unspecified atrial fibrillation: Secondary | ICD-10-CM

## 2011-12-05 ENCOUNTER — Telehealth: Payer: Self-pay

## 2011-12-05 NOTE — Telephone Encounter (Signed)
Pt has recently changed PCP and will contact that office for vaccination.

## 2011-12-05 NOTE — Telephone Encounter (Signed)
Pt called inquiring about his last Pneumovax. There is no documentation that pt has received this vaccination. Okay to schedule for nurse visit for vaccine?

## 2011-12-05 NOTE — Telephone Encounter (Signed)
Ok with me 

## 2011-12-12 DIAGNOSIS — H109 Unspecified conjunctivitis: Secondary | ICD-10-CM | POA: Diagnosis not present

## 2011-12-12 DIAGNOSIS — H02059 Trichiasis without entropian unspecified eye, unspecified eyelid: Secondary | ICD-10-CM | POA: Diagnosis not present

## 2011-12-15 ENCOUNTER — Encounter: Payer: Medicare Other | Admitting: *Deleted

## 2011-12-16 ENCOUNTER — Ambulatory Visit (INDEPENDENT_AMBULATORY_CARE_PROVIDER_SITE_OTHER): Payer: Medicare Other | Admitting: *Deleted

## 2011-12-16 DIAGNOSIS — I4891 Unspecified atrial fibrillation: Secondary | ICD-10-CM | POA: Diagnosis not present

## 2012-01-13 ENCOUNTER — Ambulatory Visit (INDEPENDENT_AMBULATORY_CARE_PROVIDER_SITE_OTHER): Payer: Medicare Other | Admitting: *Deleted

## 2012-01-13 DIAGNOSIS — I4891 Unspecified atrial fibrillation: Secondary | ICD-10-CM

## 2012-01-13 DIAGNOSIS — Z7901 Long term (current) use of anticoagulants: Secondary | ICD-10-CM

## 2012-01-13 LAB — POCT INR: INR: 2

## 2012-02-06 DIAGNOSIS — H02059 Trichiasis without entropian unspecified eye, unspecified eyelid: Secondary | ICD-10-CM | POA: Diagnosis not present

## 2012-02-11 ENCOUNTER — Ambulatory Visit (INDEPENDENT_AMBULATORY_CARE_PROVIDER_SITE_OTHER): Payer: Medicare Other | Admitting: *Deleted

## 2012-02-11 DIAGNOSIS — Z7901 Long term (current) use of anticoagulants: Secondary | ICD-10-CM | POA: Diagnosis not present

## 2012-02-11 DIAGNOSIS — I4891 Unspecified atrial fibrillation: Secondary | ICD-10-CM

## 2012-02-11 LAB — POCT INR: INR: 2.1

## 2012-02-12 DIAGNOSIS — R269 Unspecified abnormalities of gait and mobility: Secondary | ICD-10-CM | POA: Diagnosis not present

## 2012-02-12 DIAGNOSIS — I4891 Unspecified atrial fibrillation: Secondary | ICD-10-CM | POA: Diagnosis not present

## 2012-02-12 DIAGNOSIS — G609 Hereditary and idiopathic neuropathy, unspecified: Secondary | ICD-10-CM | POA: Diagnosis not present

## 2012-02-13 ENCOUNTER — Encounter: Payer: Self-pay | Admitting: Internal Medicine

## 2012-02-13 DIAGNOSIS — I495 Sick sinus syndrome: Secondary | ICD-10-CM | POA: Diagnosis not present

## 2012-02-26 ENCOUNTER — Emergency Department (INDEPENDENT_AMBULATORY_CARE_PROVIDER_SITE_OTHER)
Admission: EM | Admit: 2012-02-26 | Discharge: 2012-02-26 | Disposition: A | Discharge status: Home / Self Care | Payer: Medicare Other | Source: Home / Self Care | Attending: Emergency Medicine | Admitting: Emergency Medicine

## 2012-02-26 ENCOUNTER — Emergency Department (HOSPITAL_COMMUNITY): Payer: Medicare Other

## 2012-02-26 ENCOUNTER — Emergency Department (INDEPENDENT_AMBULATORY_CARE_PROVIDER_SITE_OTHER): Payer: Medicare Other

## 2012-02-26 ENCOUNTER — Encounter (HOSPITAL_COMMUNITY): Payer: Self-pay | Admitting: *Deleted

## 2012-02-26 DIAGNOSIS — S61209A Unspecified open wound of unspecified finger without damage to nail, initial encounter: Secondary | ICD-10-CM | POA: Diagnosis not present

## 2012-02-26 DIAGNOSIS — S63259A Unspecified dislocation of unspecified finger, initial encounter: Secondary | ICD-10-CM

## 2012-02-26 DIAGNOSIS — M79609 Pain in unspecified limb: Secondary | ICD-10-CM | POA: Diagnosis not present

## 2012-02-26 DIAGNOSIS — M7989 Other specified soft tissue disorders: Secondary | ICD-10-CM | POA: Diagnosis not present

## 2012-02-26 DIAGNOSIS — S61219A Laceration without foreign body of unspecified finger without damage to nail, initial encounter: Secondary | ICD-10-CM

## 2012-02-26 MED ORDER — BACITRACIN ZINC 500 UNIT/GM EX OINT: TOPICAL_OINTMENT | Freq: Two times a day (BID) | CUTANEOUS | Status: DC

## 2012-02-26 NOTE — ED Notes (Signed)
Pt     [name]  today  Going  To  [employer] and  He   Stumbled  Over a     Parking  stob        Injuring  Two  Fingers  l  Hand        The  Middle  Finger is  Swollen  And   Slightly  Deformed  The  Ring  Finger is  Lacerated          He  Did  Not  Black out he  Remembers  Everything

## 2012-02-26 NOTE — ED Notes (Addendum)
Pt reports he fell today while at the bank. He states he knows why he fell was because he tripped over a rug. Pt states he caught himself with his hands. He did not hit his head. He is on coumadin. Bleeding to left ring finger controlled with laceration noted on the anterior side just above the 2nd nuckle. Middle finger on the left hand is deformed from the fall today. Dressing to the right hand dry and intact with old drainage noted. Pt is moving all extremities well. He reports pain to left middle finger.

## 2012-02-26 NOTE — ED Provider Notes (Signed)
History     CSN: [id number]  Arrival date & time 02/26/12  1203   First MD Initiated Contact with Patient 02/26/12 1309      Chief Complaint  Patient presents with  . Fall    (Consider location/radiation/quality/duration/timing/severity/associated sxs/prior treatment) HPI Comments: Mr. Siefert, presents today to urgent care after he has sustained a fall [place] the [place]. He tripped over a rug and landed with his left hand. Has been unable to remove his wedding band from his fourth left finger patient sustained to cuts. In its unable to recall when he did get his last tetanus shot. Swelling has worsening since he sustained a fall.  Denies any weakness, no headaches, no dizziness denies any other further symptoms when asked  Patient is a 78 y.o. male presenting with fall. The history is provided by the patient.  Fall The accident occurred 1 to 2 hours ago. The fall occurred while walking. He fell from a height of 1 to 2 ft. He landed on carpet. Point of impact: L hand 4th and 3rd finger. The pain is at a severity of 5/10. The pain is moderate. He was ambulatory at the scene. There was no entrapment after the fall. There was no drug use involved in the accident. There was no alcohol use involved in the accident. Pertinent negatives include no numbness. The symptoms are aggravated by activity, flexion and extension. He has tried nothing for the symptoms. The treatment provided no relief.    Past Medical History  Diagnosis Date  . Atrial fibrillation     coumadin Rx  . Sick sinus syndrome     s/p PPM  . Ataxia     followed by Dr. Sandria Manly  . Hyperlipidemia   . Hypothyroidism   . BPH (benign prostatic hyperplasia)   . Hypertension   . TIA (transient ischemic attack)     carotid dopplers 5/11: 0-39% bilateral; follow up due 03/2012  . Peripheral neuropathy     Past Surgical History  Procedure Date  . Pacemaker insertion     implanted 10/96, most recent Gen Change by Dr Juanda Chance  8/08    History reviewed. No pertinent family history.  History  Substance Use Topics  . Smoking status: Former Games developer  . Smokeless tobacco: Not on file  . Alcohol Use: No      Review of Systems  Constitutional: Negative for chills, diaphoresis, appetite change and fatigue.  HENT: Negative for neck pain.   Respiratory: Negative.   Skin: Negative for color change.  Neurological: Negative for numbness.    Allergies  Mucinex  Home Medications   Current Outpatient Rx  Name Route Sig Dispense Refill  . AMIODARONE HCL 100 MG PO TABS Oral Take 100 mg by mouth daily.      . ASPIRIN 81 MG PO TABS Oral Take 81 mg by mouth daily.      Marland Kitchen DOCUSATE SODIUM 100 MG PO CAPS Oral Take 100 mg by mouth daily.      Marland Kitchen LEVOTHYROXINE SODIUM 25 MCG PO TABS Oral Take 25 mcg by mouth daily.      Marland Kitchen LORAZEPAM 1 MG PO TABS Oral Take 0.5 mg by mouth as needed.      Marland Kitchen ONE-DAILY MULTI VITAMINS PO TABS Oral Take 1 tablet by mouth daily.      Marland Kitchen SIMVASTATIN 20 MG PO TABS Oral Take 20 mg by mouth at bedtime.      . WARFARIN SODIUM 5 MG PO TABS  Take as directed by Anticoagulation clinic 90 tablet 1  . ZOLPIDEM TARTRATE 10 MG PO TABS Oral Take 10 mg by mouth at bedtime as needed.        BP 184/88  Pulse 60  Temp(Src) 97.9 F (36.6 C) (Oral)  Resp 16  SpO2 97%  Physical Exam  Nursing note and vitals reviewed. Constitutional: He appears well-developed and well-nourished. No distress.  HENT:  Head: Normocephalic.  Right Ear: Tympanic membrane normal.  Left Ear: Tympanic membrane normal.  Neck: Neck supple.  Pulmonary/Chest: No respiratory distress. He has no decreased breath sounds. He has no wheezes. He has no rhonchi. He has no rales.  Abdominal: He exhibits no distension. There is no tenderness.  Musculoskeletal:       Left wrist: He exhibits tenderness and bony tenderness. He exhibits normal range of motion and no swelling.  Skin: Skin is warm. No erythema.       ED Course  LACERATION  REPAIR Performed by: Mae Denunzio Authorized by: Jimmie Molly Consent: Verbal consent obtained. Written consent not obtained. Consent given by: patient Patient understanding: patient states understanding of the procedure being performed Patient identity confirmed: verbally with patient Body area: upper extremity Location details: left long finger Laceration length: 3 cm Foreign bodies: no foreign bodies Tendon involvement: superficial Nerve involvement: none Anesthesia: local infiltration Local anesthetic: lidocaine 1% without epinephrine Anesthetic total: 6 ml Irrigation solution: saline Debridement: minimal Degree of undermining: minimal Skin closure: 4-0 Prolene Approximation difficulty: simple Dressing: antibiotic ointment and 4x4 sterile gauze   (including critical care time)  Labs Reviewed - No data to display Dg Hand Complete Left  02/26/2012  *RADIOLOGY REPORT*  Clinical Data: Status post fall.  Lacerations and pain about the third and fourth fingers.  LEFT HAND - COMPLETE 3+ VIEW  Comparison: None.  Findings: Soft tissue swelling is identified, most notable about the ring finger.  No fracture or dislocation is identified.  Tiny radiopaque densities projecting over the ring and little fingers are likely due to artifact.  First CMC osteoarthritis is noted.  IMPRESSION: Soft tissue swelling without fracture.  Original Report Authenticated By: Bernadene Bell. D'ALESSIO, M.D.     1. Laceration of finger of left hand   2. Dislocation of finger, closed       MDM  Patient sustained a 3 cm laceration to fourth left digit on palmar surface PIP section. Fourth finger with obvious deformity and dislocation that was fixed post analgesia.        Jimmie Molly, MD 02/26/12 [phone]

## 2012-02-26 NOTE — Discharge Instructions (Signed)
  As discussed return sooner than 10 days if any increased swelling redness or discharge of the make the wound suspicious for infection. For the next 2-3 days try to keep your hand elevated as much as possible to prevent swelling wash hands daily to 3 times a day with antibacterial soap and apply a topical antibiotic 2-3 times per day.    Finger Dislocation Finger dislocation is the displacement of bones in your finger at the joints. Most commonly, finger dislocation occurs at the proximal interphalangeal joint (the joint closest to your knuckle). Very strong, fibrous tissues (ligaments) and joint capsules connect the three bones of your fingers.  CAUSES Dislocation is caused by a forceful impact. This impact moves these bones off the joint and often tears your ligaments.  SYMPTOMS Symptoms of finger dislocation include:  Deformity of your finger.   Pain, with loss of movement.  DIAGNOSIS  Finger dislocation is diagnosed with a physical exam. Often, X-ray exams are done to see if you have associated injuries, such as bone fractures. TREATMENT  Finger dislocations are treated by putting your bones back into position (reduction) either by manually moving the bones back into place or through surgery. Your finger is then kept in a fixed position (immobilized) with the use of a dressing or splint for a brief period. When your ligament has to be surgically repaired, it needs to be kept in a fixed position with a dressing or splint for 1 to 2 weeks. Because joint stiffness is a long-term complication of finger dislocation, hand exercises or physical therapy to increase the range of motion and to regain strength is usually started as soon as the ligament is healed. Exercises and therapy generally last no more than 3 months. HOME CARE INSTRUCTIONS The following measures can help to reduce pain and speed up the healing process:  Rest your injured joint. Do not move until instructed otherwise by your  caregiver. Avoid activities similar to the one that caused your injury.   Apply ice to your injured joint for the first day or 2 after your reduction or as directed by your caregiver. Applying ice helps to reduce inflammation and pain.   Put ice in a plastic bag.   Place a towel between your skin and the bag.   Leave the ice on for 15 to 20 minutes at a time, every 2 hours while you are awake.   Elevate your hand above your heart as directed by your caregiver to reduce swelling.   Take over-the-counter or prescription medicine for pain as your caregiver instructs you.  SEEK IMMEDIATE MEDICAL CARE IF:  Your dressing or splint becomes damaged.   Your pain becomes worse rather than better.   You lose feeling in your finger, or it becomes cold and white.  MAKE SURE YOU:  Understand these instructions.   Will watch your condition.   Will get help right away if you are not doing well or get worse.  Document Released: 11/14/2000 Document Revised: 11/06/2011 Document Reviewed: 09/07/2011 [employer] Patient Information 2012 [place], [place].

## 2012-02-26 NOTE — ED Notes (Addendum)
Unable to remove Pt's ring from the hurt Lt hand index finger. Pt had to much pain when I tried to remove ring from finger.

## 2012-02-27 ENCOUNTER — Telehealth (HOSPITAL_COMMUNITY): Payer: Self-pay | Admitting: *Deleted

## 2012-02-27 NOTE — ED Notes (Addendum)
Pt. states when he got home last night her found 2 cuts on his R knee. He said he put Neosporin on it last night and today it looks like it has pus.  He thinks its infected. States he needs antibiotics. Discussed with Dr. Lorenza Chick and he said its not been long enough to get infected. He said it is probably a soft scab and pt. needs to clean it twice/day and reapply the Neosporin. If getting worse over next 48 hrs pt. should come back for a recheck. I told pt. this.  Pt. states he has A-fib and has had a stroke and any cut he has had since 1996 the doctor has put him on antibiotics. He thinks Dr. Ladon Applebaum forgot. I told him his instructions say to come back if it is showing signs of infection and he would get antibiotics at that time. I reviewed wound care and signs of infection. Pt. voiced understanding. He said he is going out of town to [employer] this weekend. I told him if it gets infected there to go to the ED to be checked. Vassie Moselle 02/27/2012

## 2012-03-01 ENCOUNTER — Other Ambulatory Visit: Payer: Self-pay | Admitting: Pharmacist

## 2012-03-01 MED ORDER — WARFARIN SODIUM 5 MG PO TABS: ORAL_TABLET | ORAL | Status: DC

## 2012-03-08 ENCOUNTER — Encounter (HOSPITAL_COMMUNITY): Payer: Self-pay | Admitting: Emergency Medicine

## 2012-03-08 ENCOUNTER — Emergency Department (INDEPENDENT_AMBULATORY_CARE_PROVIDER_SITE_OTHER)
Admission: EM | Admit: 2012-03-08 | Discharge: 2012-03-08 | Disposition: A | Discharge status: Home / Self Care | Payer: Medicare Other | Source: Home / Self Care

## 2012-03-08 DIAGNOSIS — Z4802 Encounter for removal of sutures: Secondary | ICD-10-CM

## 2012-03-08 NOTE — ED Provider Notes (Signed)
Medical screening examination/treatment/procedure(s) were performed by non-physician practitioner and as supervising physician I was immediately available for consultation/collaboration.Medical screening examination/treatment/procedure(s) were performed by non-physician practitioner and as supervising physician I was immediately available for consultation/collaboration.I have interviewed and examined the patient along with Nurse practitioner student.  Tessie Ordaz   Raynald Blend, MD 03/08/12 1325

## 2012-03-08 NOTE — Discharge Instructions (Signed)
Your wounds are healing well and have no signs of infection. Leave the steri strips on for 2 - 3 days. Keep the dressing dry. If you get the steri strips wet they will come off. Follow up with Dr Jonny Ruiz in one week if you are not still noticing improvement of movement, or continue to have concerns with the appearance of your laceration.

## 2012-03-08 NOTE — ED Notes (Signed)
PT HERE FOR SUTURE REMOVAL TO LEFT HAND 3RD,4TH POSTERIOR FINGERS S/P LACERATION FROM FALL 02/26/12.C/O PAIN WITH BENDING AND UNABLE TO CLOSE FIST FULLY.SWELLING SEEN BUT DENIES NUMB/TINGLING OR DRAINAGE.VSS

## 2012-03-08 NOTE — ED Provider Notes (Signed)
History     CSN: [id number]  Arrival date & time 03/08/12  0805   None     Chief Complaint  Patient presents with  . Suture / Staple Removal    (Consider location/radiation/quality/duration/timing/severity/associated sxs/prior treatment) HPI Comments: Patient presents today for suture removal of left third and fourth fingers. He fell on March 28 2013th sustaining lacerations to his fingers as well as dislocating his ring finger. Patient states that the swelling of his fingers has a significantly reduced. He is also noting improvement in the range of motion and tenderness of his third and fourth fingers as well. He has had no redness or drainage from his wounds. He is concerned that he should have had one or 2 stitches more in his third finger laceration, and that the medial quarter of the wound has a more scabbed appearance than the remainder. "I want this noted in my chart in case I get an infection down the road."  He denies numbness or tingling of the fingers.    Past Medical History  Diagnosis Date  . Atrial fibrillation     coumadin Rx  . Sick sinus syndrome     s/p PPM  . Ataxia     followed by Dr. Sandria Manly  . Hyperlipidemia   . Hypothyroidism   . BPH (benign prostatic hyperplasia)   . Hypertension   . TIA (transient ischemic attack)     carotid dopplers 5/11: 0-39% bilateral; follow up due 03/2012  . Peripheral neuropathy     Past Surgical History  Procedure Date  . Pacemaker insertion     implanted 10/96, most recent Gen Change by Dr Juanda Chance 8/08    History reviewed. No pertinent family history.  History  Substance Use Topics  . Smoking status: Former Games developer  . Smokeless tobacco: Not on file  . Alcohol Use: No      Review of Systems  Constitutional: Negative for fever and chills.  Musculoskeletal: Positive for joint swelling.  Skin: Negative for color change.  Neurological: Negative for weakness and numbness.    Allergies  Mucinex  Home Medications    Current Outpatient Rx  Name Route Sig Dispense Refill  . AMIODARONE HCL 100 MG PO TABS Oral Take 100 mg by mouth daily.      . ASPIRIN 81 MG PO TABS Oral Take 81 mg by mouth daily.      Marland Kitchen DOCUSATE SODIUM 100 MG PO CAPS Oral Take 100 mg by mouth daily.      Marland Kitchen LEVOTHYROXINE SODIUM 25 MCG PO TABS Oral Take 25 mcg by mouth daily.      Marland Kitchen LORAZEPAM 1 MG PO TABS Oral Take 0.5 mg by mouth as needed.      Marland Kitchen ONE-DAILY MULTI VITAMINS PO TABS Oral Take 1 tablet by mouth daily.      Marland Kitchen SIMVASTATIN 20 MG PO TABS Oral Take 20 mg by mouth at bedtime.      . WARFARIN SODIUM 5 MG PO TABS  Take as directed by Anticoagulation clinic 90 tablet 1    90-day supply  . ZOLPIDEM TARTRATE 10 MG PO TABS Oral Take 10 mg by mouth at bedtime as needed.        BP 145/77  Pulse 60  Temp(Src) 97.8 F (36.6 C) (Oral)  Resp 16  SpO2 99%  Physical Exam  Nursing note and vitals reviewed. Constitutional: He appears well-developed and well-nourished. No distress.  Musculoskeletal:       Left hand: He exhibits decreased  range of motion and swelling. He exhibits no tenderness, no bony tenderness, normal capillary refill and no deformity. normal sensation noted. Normal strength noted.       Hands:      Well healed lacerations palmar aspect of 3rd & 4th  fingers. Wound edges well approximated. No erythema or drainage.   Neurological: He has normal strength.  Skin: Skin is warm and dry. No erythema.  Psychiatric: He has a normal mood and affect.    ED Course  SUTURE REMOVAL Date/Time: 03/08/2012 8:45 AM Performed by: Melody Comas Authorized by: Melody Comas Consent: Verbal consent obtained. Written consent not obtained. Consent given by: patient Patient understanding: patient states understanding of the procedure being performed Body area: upper extremity Location details: left hand Sutures Removed: 6 Post-removal: Steri-Strips applied and dressing applied Patient tolerance: Patient tolerated the procedure  well with no immediate complications.   (including critical care time)  Labs Reviewed - No data to display No results found.   1. Visit for suture removal       MDM  Discussed with patient though he has concerns that his laceration should have had "1or 2 more stitches" that the laceration is well healed. There is no sign of infection. Improving ROM and swelling of ring finger PIP joint reported by pt. He will continue flexion and extension exercises during the day.         Melody Comas, [place] 03/08/12 [phone]

## 2012-03-11 ENCOUNTER — Ambulatory Visit (INDEPENDENT_AMBULATORY_CARE_PROVIDER_SITE_OTHER): Payer: Medicare Other | Admitting: Pharmacist

## 2012-03-11 DIAGNOSIS — Z7901 Long term (current) use of anticoagulants: Secondary | ICD-10-CM | POA: Diagnosis not present

## 2012-03-11 DIAGNOSIS — I4891 Unspecified atrial fibrillation: Secondary | ICD-10-CM | POA: Diagnosis not present

## 2012-03-12 DIAGNOSIS — H02059 Trichiasis without entropian unspecified eye, unspecified eyelid: Secondary | ICD-10-CM | POA: Diagnosis not present

## 2012-04-02 DIAGNOSIS — E782 Mixed hyperlipidemia: Secondary | ICD-10-CM | POA: Diagnosis not present

## 2012-04-02 DIAGNOSIS — R5381 Other malaise: Secondary | ICD-10-CM | POA: Diagnosis not present

## 2012-04-02 DIAGNOSIS — E039 Hypothyroidism, unspecified: Secondary | ICD-10-CM | POA: Diagnosis not present

## 2012-04-02 DIAGNOSIS — R5383 Other fatigue: Secondary | ICD-10-CM | POA: Diagnosis not present

## 2012-04-02 DIAGNOSIS — Z0000 Encounter for general adult medical examination without abnormal findings: Secondary | ICD-10-CM | POA: Diagnosis not present

## 2012-04-08 ENCOUNTER — Ambulatory Visit (INDEPENDENT_AMBULATORY_CARE_PROVIDER_SITE_OTHER): Payer: Medicare Other

## 2012-04-08 DIAGNOSIS — Z7901 Long term (current) use of anticoagulants: Secondary | ICD-10-CM | POA: Diagnosis not present

## 2012-04-08 DIAGNOSIS — E785 Hyperlipidemia, unspecified: Secondary | ICD-10-CM | POA: Diagnosis not present

## 2012-04-08 DIAGNOSIS — E039 Hypothyroidism, unspecified: Secondary | ICD-10-CM | POA: Diagnosis not present

## 2012-04-08 DIAGNOSIS — I4891 Unspecified atrial fibrillation: Secondary | ICD-10-CM

## 2012-04-08 DIAGNOSIS — G609 Hereditary and idiopathic neuropathy, unspecified: Secondary | ICD-10-CM | POA: Diagnosis not present

## 2012-04-08 DIAGNOSIS — Z23 Encounter for immunization: Secondary | ICD-10-CM | POA: Diagnosis not present

## 2012-04-15 DIAGNOSIS — H2510 Age-related nuclear cataract, unspecified eye: Secondary | ICD-10-CM | POA: Diagnosis not present

## 2012-04-15 DIAGNOSIS — H02059 Trichiasis without entropian unspecified eye, unspecified eyelid: Secondary | ICD-10-CM | POA: Diagnosis not present

## 2012-04-28 ENCOUNTER — Ambulatory Visit (INDEPENDENT_AMBULATORY_CARE_PROVIDER_SITE_OTHER): Payer: Medicare Other | Admitting: Pharmacist

## 2012-04-28 DIAGNOSIS — Z7901 Long term (current) use of anticoagulants: Secondary | ICD-10-CM

## 2012-04-28 DIAGNOSIS — I4891 Unspecified atrial fibrillation: Secondary | ICD-10-CM

## 2012-05-06 ENCOUNTER — Other Ambulatory Visit: Payer: Self-pay | Admitting: *Deleted

## 2012-05-06 DIAGNOSIS — I6529 Occlusion and stenosis of unspecified carotid artery: Secondary | ICD-10-CM

## 2012-05-11 ENCOUNTER — Ambulatory Visit (INDEPENDENT_AMBULATORY_CARE_PROVIDER_SITE_OTHER): Payer: Medicare Other | Admitting: Pharmacist

## 2012-05-11 ENCOUNTER — Encounter (INDEPENDENT_AMBULATORY_CARE_PROVIDER_SITE_OTHER): Payer: Medicare Other

## 2012-05-11 DIAGNOSIS — I6529 Occlusion and stenosis of unspecified carotid artery: Secondary | ICD-10-CM | POA: Diagnosis not present

## 2012-05-11 DIAGNOSIS — Z7901 Long term (current) use of anticoagulants: Secondary | ICD-10-CM | POA: Diagnosis not present

## 2012-05-11 DIAGNOSIS — I4891 Unspecified atrial fibrillation: Secondary | ICD-10-CM | POA: Diagnosis not present

## 2012-05-11 LAB — POCT INR: INR: 2.4

## 2012-06-01 ENCOUNTER — Emergency Department (HOSPITAL_COMMUNITY): Payer: Medicare Other

## 2012-06-01 ENCOUNTER — Encounter (HOSPITAL_COMMUNITY): Payer: Self-pay | Admitting: *Deleted

## 2012-06-01 ENCOUNTER — Emergency Department (HOSPITAL_COMMUNITY)
Admission: EM | Admit: 2012-06-01 | Discharge: 2012-06-02 | Disposition: A | Discharge status: Home / Self Care | Payer: Medicare Other | Attending: Emergency Medicine | Admitting: Emergency Medicine

## 2012-06-01 DIAGNOSIS — Z79899 Other long term (current) drug therapy: Secondary | ICD-10-CM | POA: Insufficient documentation

## 2012-06-01 DIAGNOSIS — Z7901 Long term (current) use of anticoagulants: Secondary | ICD-10-CM | POA: Insufficient documentation

## 2012-06-01 DIAGNOSIS — Z8673 Personal history of transient ischemic attack (TIA), and cerebral infarction without residual deficits: Secondary | ICD-10-CM | POA: Diagnosis not present

## 2012-06-01 DIAGNOSIS — Z7982 Long term (current) use of aspirin: Secondary | ICD-10-CM | POA: Diagnosis not present

## 2012-06-01 DIAGNOSIS — I4891 Unspecified atrial fibrillation: Secondary | ICD-10-CM | POA: Insufficient documentation

## 2012-06-01 DIAGNOSIS — IMO0002 Reserved for concepts with insufficient information to code with codable children: Secondary | ICD-10-CM | POA: Insufficient documentation

## 2012-06-01 DIAGNOSIS — E785 Hyperlipidemia, unspecified: Secondary | ICD-10-CM | POA: Insufficient documentation

## 2012-06-01 DIAGNOSIS — S61509A Unspecified open wound of unspecified wrist, initial encounter: Secondary | ICD-10-CM | POA: Diagnosis not present

## 2012-06-01 DIAGNOSIS — T148XXA Other injury of unspecified body region, initial encounter: Secondary | ICD-10-CM

## 2012-06-01 DIAGNOSIS — M25519 Pain in unspecified shoulder: Secondary | ICD-10-CM | POA: Diagnosis not present

## 2012-06-01 DIAGNOSIS — I10 Essential (primary) hypertension: Secondary | ICD-10-CM | POA: Diagnosis not present

## 2012-06-01 DIAGNOSIS — Z950 Presence of cardiac pacemaker: Secondary | ICD-10-CM | POA: Insufficient documentation

## 2012-06-01 DIAGNOSIS — S42209A Unspecified fracture of upper end of unspecified humerus, initial encounter for closed fracture: Secondary | ICD-10-CM | POA: Diagnosis not present

## 2012-06-01 DIAGNOSIS — W108XXA Fall (on) (from) other stairs and steps, initial encounter: Secondary | ICD-10-CM | POA: Insufficient documentation

## 2012-06-01 DIAGNOSIS — S51009A Unspecified open wound of unspecified elbow, initial encounter: Secondary | ICD-10-CM | POA: Diagnosis not present

## 2012-06-01 DIAGNOSIS — M19019 Primary osteoarthritis, unspecified shoulder: Secondary | ICD-10-CM | POA: Diagnosis not present

## 2012-06-01 LAB — CBC
MCH: 31.7 pg (ref 26.0–34.0)
MCHC: 33.7 g/dL (ref 30.0–36.0)
MCV: 94.1 fL (ref 78.0–100.0)
Platelets: 169 10*3/uL (ref 150–400)
RDW: 12.9 % (ref 11.5–15.5)
WBC: 5 10*3/uL (ref 4.0–10.5)

## 2012-06-01 LAB — PROTIME-INR: INR: 2.42 — ABNORMAL HIGH (ref 0.00–1.49)

## 2012-06-01 LAB — BASIC METABOLIC PANEL
BUN: 23 mg/dL (ref 6–23)
Calcium: 8.4 mg/dL (ref 8.4–10.5)
Chloride: 102 mEq/L (ref 96–112)
Creatinine, Ser: 1.03 mg/dL (ref 0.50–1.35)
GFR calc Af Amer: 74 mL/min — ABNORMAL LOW (ref >90)
GFR calc non Af Amer: 64 mL/min — ABNORMAL LOW (ref >90)

## 2012-06-01 LAB — CARDIAC PANEL(CRET KIN+CKTOT+MB+TROPI)
CK, MB: 2.2 ng/mL (ref 0.3–4.0)
Total CK: 89 U/L (ref 7–232)

## 2012-06-01 MED ORDER — TRAMADOL HCL 50 MG PO TABS
50.0000 mg | ORAL_TABLET | Freq: Once | ORAL | Status: AC
  Administered 2012-06-01: 50 mg via ORAL
  Filled 2012-06-01: qty 1

## 2012-06-01 MED ORDER — TRAMADOL HCL 50 MG PO TABS: 50.0000 mg | ORAL_TABLET | Freq: Four times a day (QID) | ORAL | Status: AC | PRN

## 2012-06-01 NOTE — ED Provider Notes (Signed)
History     CSN: [id number]  Arrival date & time 06/01/12  2015   First MD Initiated Contact with Patient 06/01/12 2101      Chief Complaint  Patient presents with  . Fall    (Consider location/radiation/quality/duration/timing/severity/associated sxs/prior treatment) HPI Pt states he tripped going up a stair and fell forward landing on LUE and LLE. No head or neck trauma. No LOC. Pt now c/o L shoulder pain. No nausea vomiting, weakness, sensory changes, visual changes. Past Medical History  Diagnosis Date  . Atrial fibrillation     coumadin Rx  . Sick sinus syndrome     s/p PPM  . Ataxia     followed by Dr. Sandria Manly  . Hyperlipidemia   . Hypothyroidism   . BPH (benign prostatic hyperplasia)   . Hypertension   . TIA (transient ischemic attack)     carotid dopplers 5/11: 0-39% bilateral; follow up due 03/2012  . Peripheral neuropathy     Past Surgical History  Procedure Date  . Pacemaker insertion     implanted 10/96, most recent Gen Change by Dr Juanda Chance 8/08    History reviewed. No pertinent family history.  History  Substance Use Topics  . Smoking status: Former Games developer  . Smokeless tobacco: Not on file  . Alcohol Use: No      Review of Systems  Constitutional: Negative for fever, chills and fatigue.  HENT: Negative for neck pain and neck stiffness.   Eyes: Negative for visual disturbance.  Respiratory: Negative for chest tightness, shortness of breath and wheezing.   Cardiovascular: Negative for chest pain, palpitations and leg swelling.  Gastrointestinal: Negative for nausea, vomiting and abdominal pain.  Musculoskeletal: Positive for arthralgias. Negative for back pain.  Skin: Positive for wound. Negative for rash.  Neurological: Negative for dizziness, syncope, weakness, numbness and headaches.    Allergies  Prednisone and Guaifenesin er  Home Medications   Current Outpatient Rx  Name Route Sig Dispense Refill  . AMIODARONE HCL 100 MG PO TABS Oral  Take 100 mg by mouth daily.      . ASPIRIN 81 MG PO TABS Oral Take 81 mg by mouth daily.      Marland Kitchen DOCUSATE SODIUM 100 MG PO CAPS Oral Take 100 mg by mouth daily.      Marland Kitchen LEVOTHYROXINE SODIUM 25 MCG PO TABS Oral Take 25 mcg by mouth daily.      Marland Kitchen LORAZEPAM 1 MG PO TABS Oral Take 0.5 mg by mouth as needed. Irregular heartbeat/ anxiety    . ONE-DAILY MULTI VITAMINS PO TABS Oral Take 1 tablet by mouth daily.      Marland Kitchen SIMVASTATIN 20 MG PO TABS Oral Take 20 mg by mouth at bedtime.      . WARFARIN SODIUM 5 MG PO TABS Oral Take 2.5-5 mg by mouth daily. Takes 1 tablet on sundays, mondays, wednesdays and fridays for 5mg  dosage... Takes 1/2 tablet on tuesdays, thursdays, and saturdays for 2.5mg  dosage    . ZOLPIDEM TARTRATE 10 MG PO TABS Oral Take 10 mg by mouth at bedtime as needed. sleep    . TRAMADOL HCL 50 MG PO TABS Oral Take 1 tablet (50 mg total) by mouth every 6 (six) hours as needed for pain. 15 tablet 0    BP 147/63  Pulse 59  Temp 97.9 F (36.6 C) (Oral)  Resp 19  SpO2 100%  Physical Exam  Nursing note and vitals reviewed. Constitutional: He is oriented to person, place, and time. He  appears well-developed and well-nourished. No distress.  HENT:  Head: Normocephalic and atraumatic.  Mouth/Throat: Oropharynx is clear and moist.       No evidence of any including minor trauma to head or neck  Eyes: EOM are normal. Pupils are equal, round, and reactive to light.  Neck: Normal range of motion. Neck supple.       No posterior cervical TTP. Good ROM  Cardiovascular: Normal rate and regular rhythm.   Pulmonary/Chest: Effort normal and breath sounds normal. No respiratory distress. He has no wheezes. He has no rales.  Abdominal: Soft. Bowel sounds are normal. There is no tenderness. There is no rebound and no guarding.  Musculoskeletal: Normal range of motion. He exhibits tenderness (Pt hass ttp over lateral surface of shoulder with decreased abduction. No obvious deformity). He exhibits no edema.        Normal ROM of L elbow and wrist. Normal ROM of L knee and left hip.   Neurological: He is alert and oriented to person, place, and time.       5/5 motor, sensation intact.   Skin: Skin is warm and dry. No rash noted. He is not diaphoretic. No erythema.       Superficial skin tears to L elbow, L knee and R ankle. No active bleeding. Last Td within 3 years  Psychiatric: He has a normal mood and affect. His behavior is normal.    ED Course  Procedures (including critical care time)  Labs Reviewed  PROTIME-INR - Abnormal; Notable for the following:    Prothrombin Time 26.7 (*)     INR 2.42 (*)     All other components within normal limits  CBC - Abnormal; Notable for the following:    RBC 3.72 (*)     Hemoglobin 11.8 (*)     HCT 35.0 (*)     All other components within normal limits  BASIC METABOLIC PANEL - Abnormal; Notable for the following:    Glucose, Bld 247 (*)     GFR calc non Af Amer 64 (*)     GFR calc Af Amer 74 (*)     All other components within normal limits  CARDIAC PANEL(CRET KIN+CKTOT+MB+TROPI)   No results found.   1. Proximal humerus fracture   2. Multiple skin tears      Date: 06/01/2012  Rate: 60  Rhythm: atrial-paced rhythm  QRS Axis: normal  Intervals: normal  ST/T Wave abnormalities: normal  Conduction Disutrbances:none  Narrative Interpretation:   Old EKG Reviewed: unchanged    MDM  Wound dressed. Will place in sling and have f/u with orthopedics in 1 week. Return for concerns        Loren Racer, MD 06/01/12 2351

## 2012-06-01 NOTE — ED Notes (Signed)
Pt and wife say pt tripped going up the steps. Pt denies any dizziness prior to fall, says he just misjudged the step. Pt has skin tears to the left arm and left knee. Left are tender to touch and difficult for pat to move

## 2012-06-07 DIAGNOSIS — H02059 Trichiasis without entropian unspecified eye, unspecified eyelid: Secondary | ICD-10-CM | POA: Diagnosis not present

## 2012-06-09 DIAGNOSIS — S42309A Unspecified fracture of shaft of humerus, unspecified arm, initial encounter for closed fracture: Secondary | ICD-10-CM | POA: Diagnosis not present

## 2012-06-10 ENCOUNTER — Ambulatory Visit (INDEPENDENT_AMBULATORY_CARE_PROVIDER_SITE_OTHER): Payer: Medicare Other | Admitting: *Deleted

## 2012-06-10 DIAGNOSIS — Z7901 Long term (current) use of anticoagulants: Secondary | ICD-10-CM

## 2012-06-10 DIAGNOSIS — I4891 Unspecified atrial fibrillation: Secondary | ICD-10-CM | POA: Diagnosis not present

## 2012-06-10 LAB — POCT INR: INR: 3.2

## 2012-06-14 ENCOUNTER — Other Ambulatory Visit: Payer: Self-pay | Admitting: *Deleted

## 2012-06-14 MED ORDER — WARFARIN SODIUM 5 MG PO TABS: ORAL_TABLET | ORAL | Status: DC

## 2012-06-21 DIAGNOSIS — S42309A Unspecified fracture of shaft of humerus, unspecified arm, initial encounter for closed fracture: Secondary | ICD-10-CM | POA: Diagnosis not present

## 2012-06-22 DIAGNOSIS — R209 Unspecified disturbances of skin sensation: Secondary | ICD-10-CM | POA: Diagnosis not present

## 2012-06-22 DIAGNOSIS — L219 Seborrheic dermatitis, unspecified: Secondary | ICD-10-CM | POA: Diagnosis not present

## 2012-06-22 DIAGNOSIS — D235 Other benign neoplasm of skin of trunk: Secondary | ICD-10-CM | POA: Diagnosis not present

## 2012-07-01 ENCOUNTER — Ambulatory Visit (INDEPENDENT_AMBULATORY_CARE_PROVIDER_SITE_OTHER): Payer: Medicare Other

## 2012-07-01 DIAGNOSIS — Z7901 Long term (current) use of anticoagulants: Secondary | ICD-10-CM | POA: Diagnosis not present

## 2012-07-01 DIAGNOSIS — I4891 Unspecified atrial fibrillation: Secondary | ICD-10-CM | POA: Diagnosis not present

## 2012-07-07 DIAGNOSIS — S42309A Unspecified fracture of shaft of humerus, unspecified arm, initial encounter for closed fracture: Secondary | ICD-10-CM | POA: Diagnosis not present

## 2012-07-08 DIAGNOSIS — R269 Unspecified abnormalities of gait and mobility: Secondary | ICD-10-CM | POA: Diagnosis not present

## 2012-07-08 DIAGNOSIS — M216X9 Other acquired deformities of unspecified foot: Secondary | ICD-10-CM | POA: Diagnosis not present

## 2012-07-08 DIAGNOSIS — R42 Dizziness and giddiness: Secondary | ICD-10-CM | POA: Diagnosis not present

## 2012-07-08 DIAGNOSIS — H538 Other visual disturbances: Secondary | ICD-10-CM | POA: Diagnosis not present

## 2012-07-15 ENCOUNTER — Ambulatory Visit (INDEPENDENT_AMBULATORY_CARE_PROVIDER_SITE_OTHER): Payer: Medicare Other | Admitting: Pharmacist

## 2012-07-15 DIAGNOSIS — Z7901 Long term (current) use of anticoagulants: Secondary | ICD-10-CM

## 2012-07-15 DIAGNOSIS — I4891 Unspecified atrial fibrillation: Secondary | ICD-10-CM

## 2012-07-16 DIAGNOSIS — S42309A Unspecified fracture of shaft of humerus, unspecified arm, initial encounter for closed fracture: Secondary | ICD-10-CM | POA: Diagnosis not present

## 2012-07-23 DIAGNOSIS — S42309A Unspecified fracture of shaft of humerus, unspecified arm, initial encounter for closed fracture: Secondary | ICD-10-CM | POA: Diagnosis not present

## 2012-07-27 DIAGNOSIS — S42309A Unspecified fracture of shaft of humerus, unspecified arm, initial encounter for closed fracture: Secondary | ICD-10-CM | POA: Diagnosis not present

## 2012-07-30 DIAGNOSIS — S42309A Unspecified fracture of shaft of humerus, unspecified arm, initial encounter for closed fracture: Secondary | ICD-10-CM | POA: Diagnosis not present

## 2012-08-03 DIAGNOSIS — S42309A Unspecified fracture of shaft of humerus, unspecified arm, initial encounter for closed fracture: Secondary | ICD-10-CM | POA: Diagnosis not present

## 2012-08-05 ENCOUNTER — Ambulatory Visit (INDEPENDENT_AMBULATORY_CARE_PROVIDER_SITE_OTHER): Payer: Medicare Other | Admitting: Pharmacist

## 2012-08-05 DIAGNOSIS — I4891 Unspecified atrial fibrillation: Secondary | ICD-10-CM

## 2012-08-05 DIAGNOSIS — S42309A Unspecified fracture of shaft of humerus, unspecified arm, initial encounter for closed fracture: Secondary | ICD-10-CM | POA: Diagnosis not present

## 2012-08-05 DIAGNOSIS — Z7901 Long term (current) use of anticoagulants: Secondary | ICD-10-CM

## 2012-08-09 DIAGNOSIS — S42309A Unspecified fracture of shaft of humerus, unspecified arm, initial encounter for closed fracture: Secondary | ICD-10-CM | POA: Diagnosis not present

## 2012-08-11 DIAGNOSIS — S42309A Unspecified fracture of shaft of humerus, unspecified arm, initial encounter for closed fracture: Secondary | ICD-10-CM | POA: Diagnosis not present

## 2012-08-13 DIAGNOSIS — S42309A Unspecified fracture of shaft of humerus, unspecified arm, initial encounter for closed fracture: Secondary | ICD-10-CM | POA: Diagnosis not present

## 2012-08-17 DIAGNOSIS — S42309A Unspecified fracture of shaft of humerus, unspecified arm, initial encounter for closed fracture: Secondary | ICD-10-CM | POA: Diagnosis not present

## 2012-08-20 DIAGNOSIS — S42309A Unspecified fracture of shaft of humerus, unspecified arm, initial encounter for closed fracture: Secondary | ICD-10-CM | POA: Diagnosis not present

## 2012-08-23 DIAGNOSIS — S42309A Unspecified fracture of shaft of humerus, unspecified arm, initial encounter for closed fracture: Secondary | ICD-10-CM | POA: Diagnosis not present

## 2012-08-24 DIAGNOSIS — H02059 Trichiasis without entropian unspecified eye, unspecified eyelid: Secondary | ICD-10-CM | POA: Diagnosis not present

## 2012-08-25 DIAGNOSIS — S42309A Unspecified fracture of shaft of humerus, unspecified arm, initial encounter for closed fracture: Secondary | ICD-10-CM | POA: Diagnosis not present

## 2012-09-07 ENCOUNTER — Ambulatory Visit (INDEPENDENT_AMBULATORY_CARE_PROVIDER_SITE_OTHER): Payer: Medicare Other | Admitting: *Deleted

## 2012-09-07 DIAGNOSIS — Z7901 Long term (current) use of anticoagulants: Secondary | ICD-10-CM

## 2012-09-07 DIAGNOSIS — I4891 Unspecified atrial fibrillation: Secondary | ICD-10-CM

## 2012-09-13 DIAGNOSIS — S42309A Unspecified fracture of shaft of humerus, unspecified arm, initial encounter for closed fracture: Secondary | ICD-10-CM | POA: Diagnosis not present

## 2012-09-24 DIAGNOSIS — Z23 Encounter for immunization: Secondary | ICD-10-CM | POA: Diagnosis not present

## 2012-10-20 DIAGNOSIS — E785 Hyperlipidemia, unspecified: Secondary | ICD-10-CM | POA: Diagnosis not present

## 2012-10-20 DIAGNOSIS — I4891 Unspecified atrial fibrillation: Secondary | ICD-10-CM | POA: Diagnosis not present

## 2012-10-20 DIAGNOSIS — E039 Hypothyroidism, unspecified: Secondary | ICD-10-CM | POA: Diagnosis not present

## 2012-10-20 DIAGNOSIS — Z8673 Personal history of transient ischemic attack (TIA), and cerebral infarction without residual deficits: Secondary | ICD-10-CM | POA: Diagnosis not present

## 2012-10-27 ENCOUNTER — Encounter: Payer: Medicare Other | Admitting: Internal Medicine

## 2012-10-27 ENCOUNTER — Encounter: Payer: Self-pay | Admitting: Internal Medicine

## 2012-10-27 ENCOUNTER — Ambulatory Visit (INDEPENDENT_AMBULATORY_CARE_PROVIDER_SITE_OTHER): Payer: Medicare Other | Admitting: Internal Medicine

## 2012-10-27 ENCOUNTER — Ambulatory Visit (INDEPENDENT_AMBULATORY_CARE_PROVIDER_SITE_OTHER): Payer: Medicare Other | Admitting: *Deleted

## 2012-10-27 VITALS — BP 148/78 | HR 59 | Ht 68.0 in | Wt 152.0 lb

## 2012-10-27 DIAGNOSIS — I498 Other specified cardiac arrhythmias: Secondary | ICD-10-CM | POA: Diagnosis not present

## 2012-10-27 DIAGNOSIS — I4891 Unspecified atrial fibrillation: Secondary | ICD-10-CM | POA: Diagnosis not present

## 2012-10-27 DIAGNOSIS — Z7901 Long term (current) use of anticoagulants: Secondary | ICD-10-CM

## 2012-10-27 LAB — PACEMAKER DEVICE OBSERVATION
AL THRESHOLD: 1.25 V
ATRIAL PACING PM: 75
BAMS-0001: 150 {beats}/min
DEVICE MODEL PM: 1990093
RV LEAD THRESHOLD: 1.5 V
VENTRICULAR PACING PM: 1

## 2012-10-27 NOTE — Patient Instructions (Addendum)
Your physician wants you to follow-up in: 12 months with Dr Jacquiline Doe will receive a reminder letter in the mail two months in advance. If you don't receive a letter, please call our office to schedule the follow-up appointment.  [facility] every 3 months    Your physician has recommended you make the following change in your medication:  1) Stop Aspirin

## 2012-11-07 NOTE — Progress Notes (Signed)
PCP: Oliver Barre, MD  [name] is a 78 y.o. male who presents today for routine electrophysiology followup.  Since last being seen in our clinic, the patient reports doing very well.  Today, he denies symptoms of palpitations, chest pain, shortness of breath,  lower extremity edema, dizziness, presyncope, or syncope.  The patient is otherwise without complaint today.   Past Medical History  Diagnosis Date  . Atrial fibrillation     coumadin Rx  . Sick sinus syndrome     s/p PPM  . Ataxia     followed by Dr. Sandria Manly  . Hyperlipidemia   . Hypothyroidism   . BPH (benign prostatic hyperplasia)   . Hypertension   . TIA (transient ischemic attack)     carotid dopplers 5/11: 0-39% bilateral; follow up due 03/2012  . Peripheral neuropathy    Past Surgical History  Procedure Date  . Pacemaker insertion     implanted 10/96, most recent Gen Change by Dr Juanda Chance 8/08    Current Outpatient Prescriptions  Medication Sig Dispense Refill  . amiodarone (PACERONE) 100 MG tablet Take 100 mg by mouth daily.        Marland Kitchen docusate sodium (COLACE) 100 MG capsule Take 100 mg by mouth daily.        Marland Kitchen levothyroxine (SYNTHROID, LEVOTHROID) 50 MCG tablet Take 50 mcg by mouth daily.      Marland Kitchen LORazepam (ATIVAN) 1 MG tablet Take 0.5 mg by mouth as needed. Irregular heartbeat/ anxiety      . Multiple Vitamin (MULTIVITAMIN) tablet Take 1 tablet by mouth daily.        . simvastatin (ZOCOR) 20 MG tablet Take 20 mg by mouth at bedtime.        Marland Kitchen warfarin (COUMADIN) 5 MG tablet Take as directed by coumadin clinic  90 tablet  0  . zolpidem (AMBIEN) 10 MG tablet Take 10 mg by mouth at bedtime as needed. sleep        Physical Exam: Filed Vitals:   10/27/12 1104  BP: 148/78  Pulse: 59  Height: 5\' 8"  (1.727 m)  Weight: 152 lb (68.947 kg)    GEN- The patient is well appearing, alert and oriented x 3 today.   Head- normocephalic, atraumatic Eyes-  Sclera clear, conjunctiva pink Ears- hearing intact Oropharynx-  clear Lungs- Clear to ausculation bilaterally, normal work of breathing Chest- pacemaker pocket is well healed Heart- Regular rate and rhythm, no murmurs, rubs or gallops, PMI not laterally displaced GI- soft, NT, ND, + BS Extremities- no clubbing, cyanosis, or edema  Pacemaker interrogation- reviewed in detail today,  See PACEART report  Assessment and Plan:  SICK SINUS SYNDROME  Normal pacemaker function  See Pace Art report  No changes today   ATRIAL FIBRILLATION  Maintaining sinus rhythm with amiodarone 100mg  daily  Continue coumadin long term, stop ASA He wishes to have his primary care physician follow TFTs and LFTs at this point  PERIPHERAL NEUROPATHY   Followed by Dr Sandria Manly  He has occasional orthostatic hypotension with symptoms of unsteadiness and dizziness, likely due to neuropathy.  This has improved since his last visit No changes

## 2012-11-12 ENCOUNTER — Encounter: Payer: Self-pay | Admitting: Internal Medicine

## 2012-12-06 ENCOUNTER — Telehealth: Payer: Self-pay | Admitting: Internal Medicine

## 2012-12-06 NOTE — Telephone Encounter (Signed)
New problem:   Patient notice a mistake on his pacemaker report.  Was told 2 different report was 9.5 / wrote up from nurse was  4.5.

## 2012-12-07 NOTE — Telephone Encounter (Signed)
Follow-up:    Patient called in to follow up on the call he placed yesterday.  Please call back.

## 2012-12-07 NOTE — Telephone Encounter (Signed)
Spoke to patient he stated he wanted PCP in chart changed to Dr.Ramachandran.Also stated he needed to speak to to Dr.Allred's nurse about is pacemaker battery life.Message sent to Dr.Allred's nurse.

## 2012-12-08 ENCOUNTER — Ambulatory Visit (INDEPENDENT_AMBULATORY_CARE_PROVIDER_SITE_OTHER): Payer: Medicare Other | Admitting: *Deleted

## 2012-12-08 DIAGNOSIS — Z7901 Long term (current) use of anticoagulants: Secondary | ICD-10-CM

## 2012-12-08 DIAGNOSIS — I4891 Unspecified atrial fibrillation: Secondary | ICD-10-CM | POA: Diagnosis not present

## 2012-12-13 NOTE — Telephone Encounter (Signed)
Discussed battery longevity with patient.  He still has 6-9.25 years left on j=his battery  I have also mailed him a copy of his 2012 office note for his records

## 2012-12-15 DIAGNOSIS — R319 Hematuria, unspecified: Secondary | ICD-10-CM | POA: Diagnosis not present

## 2012-12-15 DIAGNOSIS — R5383 Other fatigue: Secondary | ICD-10-CM | POA: Diagnosis not present

## 2012-12-15 DIAGNOSIS — I4891 Unspecified atrial fibrillation: Secondary | ICD-10-CM | POA: Diagnosis not present

## 2012-12-15 DIAGNOSIS — R5381 Other malaise: Secondary | ICD-10-CM | POA: Diagnosis not present

## 2012-12-15 DIAGNOSIS — E039 Hypothyroidism, unspecified: Secondary | ICD-10-CM | POA: Diagnosis not present

## 2013-01-17 DIAGNOSIS — E039 Hypothyroidism, unspecified: Secondary | ICD-10-CM | POA: Diagnosis not present

## 2013-01-17 DIAGNOSIS — M545 Low back pain: Secondary | ICD-10-CM | POA: Diagnosis not present

## 2013-01-17 DIAGNOSIS — G609 Hereditary and idiopathic neuropathy, unspecified: Secondary | ICD-10-CM | POA: Diagnosis not present

## 2013-01-17 DIAGNOSIS — I4891 Unspecified atrial fibrillation: Secondary | ICD-10-CM | POA: Diagnosis not present

## 2013-01-19 ENCOUNTER — Ambulatory Visit (INDEPENDENT_AMBULATORY_CARE_PROVIDER_SITE_OTHER): Payer: Medicare Other | Admitting: *Deleted

## 2013-01-19 DIAGNOSIS — Z7901 Long term (current) use of anticoagulants: Secondary | ICD-10-CM

## 2013-01-19 DIAGNOSIS — I4891 Unspecified atrial fibrillation: Secondary | ICD-10-CM | POA: Diagnosis not present

## 2013-01-24 ENCOUNTER — Ambulatory Visit (INDEPENDENT_AMBULATORY_CARE_PROVIDER_SITE_OTHER): Payer: Medicare Other | Admitting: Pharmacist

## 2013-02-07 ENCOUNTER — Ambulatory Visit (INDEPENDENT_AMBULATORY_CARE_PROVIDER_SITE_OTHER): Payer: Medicare Other | Admitting: Pharmacist

## 2013-02-07 DIAGNOSIS — Z7901 Long term (current) use of anticoagulants: Secondary | ICD-10-CM | POA: Diagnosis not present

## 2013-02-08 DIAGNOSIS — L820 Inflamed seborrheic keratosis: Secondary | ICD-10-CM | POA: Diagnosis not present

## 2013-02-11 ENCOUNTER — Encounter: Payer: Self-pay | Admitting: Internal Medicine

## 2013-02-11 DIAGNOSIS — I495 Sick sinus syndrome: Secondary | ICD-10-CM | POA: Diagnosis not present

## 2013-03-09 ENCOUNTER — Ambulatory Visit (INDEPENDENT_AMBULATORY_CARE_PROVIDER_SITE_OTHER): Payer: Medicare Other | Admitting: *Deleted

## 2013-03-09 DIAGNOSIS — I4891 Unspecified atrial fibrillation: Secondary | ICD-10-CM

## 2013-03-09 DIAGNOSIS — Z7901 Long term (current) use of anticoagulants: Secondary | ICD-10-CM | POA: Diagnosis not present

## 2013-04-06 DIAGNOSIS — I4891 Unspecified atrial fibrillation: Secondary | ICD-10-CM | POA: Diagnosis not present

## 2013-04-06 DIAGNOSIS — H02059 Trichiasis without entropian unspecified eye, unspecified eyelid: Secondary | ICD-10-CM | POA: Diagnosis not present

## 2013-04-06 DIAGNOSIS — E039 Hypothyroidism, unspecified: Secondary | ICD-10-CM | POA: Diagnosis not present

## 2013-04-06 DIAGNOSIS — N400 Enlarged prostate without lower urinary tract symptoms: Secondary | ICD-10-CM | POA: Diagnosis not present

## 2013-04-06 DIAGNOSIS — H2510 Age-related nuclear cataract, unspecified eye: Secondary | ICD-10-CM | POA: Diagnosis not present

## 2013-04-06 DIAGNOSIS — E785 Hyperlipidemia, unspecified: Secondary | ICD-10-CM | POA: Diagnosis not present

## 2013-04-06 DIAGNOSIS — Z0000 Encounter for general adult medical examination without abnormal findings: Secondary | ICD-10-CM | POA: Diagnosis not present

## 2013-04-13 ENCOUNTER — Ambulatory Visit (INDEPENDENT_AMBULATORY_CARE_PROVIDER_SITE_OTHER): Payer: Medicare Other | Admitting: *Deleted

## 2013-04-13 DIAGNOSIS — Z7901 Long term (current) use of anticoagulants: Secondary | ICD-10-CM

## 2013-04-13 DIAGNOSIS — I4891 Unspecified atrial fibrillation: Secondary | ICD-10-CM | POA: Diagnosis not present

## 2013-04-13 DIAGNOSIS — E039 Hypothyroidism, unspecified: Secondary | ICD-10-CM | POA: Diagnosis not present

## 2013-04-13 DIAGNOSIS — E785 Hyperlipidemia, unspecified: Secondary | ICD-10-CM | POA: Diagnosis not present

## 2013-04-13 DIAGNOSIS — N400 Enlarged prostate without lower urinary tract symptoms: Secondary | ICD-10-CM | POA: Diagnosis not present

## 2013-05-13 DIAGNOSIS — I495 Sick sinus syndrome: Secondary | ICD-10-CM | POA: Diagnosis not present

## 2013-05-25 ENCOUNTER — Ambulatory Visit (INDEPENDENT_AMBULATORY_CARE_PROVIDER_SITE_OTHER): Payer: Medicare Other | Admitting: *Deleted

## 2013-05-25 DIAGNOSIS — I4891 Unspecified atrial fibrillation: Secondary | ICD-10-CM

## 2013-05-25 DIAGNOSIS — Z7901 Long term (current) use of anticoagulants: Secondary | ICD-10-CM | POA: Diagnosis not present

## 2013-05-25 LAB — POCT INR: INR: 3.9

## 2013-06-16 ENCOUNTER — Ambulatory Visit (INDEPENDENT_AMBULATORY_CARE_PROVIDER_SITE_OTHER): Payer: Medicare Other | Admitting: *Deleted

## 2013-06-16 DIAGNOSIS — I4891 Unspecified atrial fibrillation: Secondary | ICD-10-CM

## 2013-06-16 DIAGNOSIS — Z7901 Long term (current) use of anticoagulants: Secondary | ICD-10-CM

## 2013-06-16 LAB — POCT INR: INR: 2.3

## 2013-07-07 ENCOUNTER — Other Ambulatory Visit: Payer: Self-pay | Admitting: Internal Medicine

## 2013-07-14 ENCOUNTER — Ambulatory Visit (INDEPENDENT_AMBULATORY_CARE_PROVIDER_SITE_OTHER): Payer: Medicare Other | Admitting: *Deleted

## 2013-07-14 DIAGNOSIS — I4891 Unspecified atrial fibrillation: Secondary | ICD-10-CM

## 2013-07-14 DIAGNOSIS — Z7901 Long term (current) use of anticoagulants: Secondary | ICD-10-CM

## 2013-07-14 LAB — POCT INR: INR: 2

## 2013-08-11 ENCOUNTER — Ambulatory Visit (INDEPENDENT_AMBULATORY_CARE_PROVIDER_SITE_OTHER): Payer: Medicare Other

## 2013-08-11 DIAGNOSIS — I4891 Unspecified atrial fibrillation: Secondary | ICD-10-CM

## 2013-08-11 DIAGNOSIS — Z7901 Long term (current) use of anticoagulants: Secondary | ICD-10-CM | POA: Diagnosis not present

## 2013-08-11 LAB — POCT INR: INR: 3.3

## 2013-09-01 ENCOUNTER — Ambulatory Visit (INDEPENDENT_AMBULATORY_CARE_PROVIDER_SITE_OTHER): Payer: Medicare Other | Admitting: *Deleted

## 2013-09-01 DIAGNOSIS — I4891 Unspecified atrial fibrillation: Secondary | ICD-10-CM | POA: Diagnosis not present

## 2013-09-01 DIAGNOSIS — Z7901 Long term (current) use of anticoagulants: Secondary | ICD-10-CM | POA: Diagnosis not present

## 2013-09-01 LAB — POCT INR: INR: 2.3

## 2013-09-08 DIAGNOSIS — R05 Cough: Secondary | ICD-10-CM | POA: Diagnosis not present

## 2013-09-08 DIAGNOSIS — E785 Hyperlipidemia, unspecified: Secondary | ICD-10-CM | POA: Diagnosis not present

## 2013-09-08 DIAGNOSIS — Z8673 Personal history of transient ischemic attack (TIA), and cerebral infarction without residual deficits: Secondary | ICD-10-CM | POA: Diagnosis not present

## 2013-09-08 DIAGNOSIS — I4891 Unspecified atrial fibrillation: Secondary | ICD-10-CM | POA: Diagnosis not present

## 2013-09-09 ENCOUNTER — Other Ambulatory Visit: Payer: Self-pay | Admitting: Internal Medicine

## 2013-09-09 DIAGNOSIS — R911 Solitary pulmonary nodule: Secondary | ICD-10-CM | POA: Diagnosis not present

## 2013-09-09 DIAGNOSIS — Z23 Encounter for immunization: Secondary | ICD-10-CM | POA: Diagnosis not present

## 2013-09-09 DIAGNOSIS — R05 Cough: Secondary | ICD-10-CM | POA: Diagnosis not present

## 2013-09-12 ENCOUNTER — Other Ambulatory Visit: Payer: Medicare Other

## 2013-09-12 ENCOUNTER — Ambulatory Visit
Admission: RE | Admit: 2013-09-12 | Discharge: 2013-09-12 | Disposition: A | Discharge status: Home / Self Care | Payer: Medicare Other | Source: Ambulatory Visit | Attending: Internal Medicine | Admitting: Internal Medicine

## 2013-09-12 DIAGNOSIS — J984 Other disorders of lung: Secondary | ICD-10-CM | POA: Diagnosis not present

## 2013-09-12 DIAGNOSIS — R911 Solitary pulmonary nodule: Secondary | ICD-10-CM

## 2013-09-29 ENCOUNTER — Ambulatory Visit (INDEPENDENT_AMBULATORY_CARE_PROVIDER_SITE_OTHER): Payer: Medicare Other | Admitting: Pharmacist

## 2013-09-29 DIAGNOSIS — Z7901 Long term (current) use of anticoagulants: Secondary | ICD-10-CM | POA: Diagnosis not present

## 2013-09-29 DIAGNOSIS — I4891 Unspecified atrial fibrillation: Secondary | ICD-10-CM | POA: Diagnosis not present

## 2013-09-29 LAB — POCT INR: INR: 2.9

## 2013-11-02 ENCOUNTER — Encounter: Payer: Self-pay | Admitting: Internal Medicine

## 2013-11-02 ENCOUNTER — Ambulatory Visit (INDEPENDENT_AMBULATORY_CARE_PROVIDER_SITE_OTHER): Payer: Medicare Other | Admitting: *Deleted

## 2013-11-02 ENCOUNTER — Ambulatory Visit (INDEPENDENT_AMBULATORY_CARE_PROVIDER_SITE_OTHER): Payer: Medicare Other | Admitting: Internal Medicine

## 2013-11-02 VITALS — BP 141/73 | HR 59 | Ht 68.0 in | Wt 158.8 lb

## 2013-11-02 DIAGNOSIS — I4891 Unspecified atrial fibrillation: Secondary | ICD-10-CM | POA: Diagnosis not present

## 2013-11-02 DIAGNOSIS — I495 Sick sinus syndrome: Secondary | ICD-10-CM

## 2013-11-02 DIAGNOSIS — G609 Hereditary and idiopathic neuropathy, unspecified: Secondary | ICD-10-CM | POA: Diagnosis not present

## 2013-11-02 DIAGNOSIS — Z950 Presence of cardiac pacemaker: Secondary | ICD-10-CM | POA: Diagnosis not present

## 2013-11-02 DIAGNOSIS — I498 Other specified cardiac arrhythmias: Secondary | ICD-10-CM | POA: Diagnosis not present

## 2013-11-02 DIAGNOSIS — Z7901 Long term (current) use of anticoagulants: Secondary | ICD-10-CM | POA: Diagnosis not present

## 2013-11-02 LAB — MDC_IDC_ENUM_SESS_TYPE_INCLINIC
Battery Impedance: 1100 Ohm
Battery Voltage: 2.79 V
Date Time Interrogation Session: 20141203125130
Implantable Pulse Generator Serial Number: 1990093
Lead Channel Impedance Value: 345 Ohm
Lead Channel Pacing Threshold Amplitude: 1.75 V
Lead Channel Pacing Threshold Pulse Width: 0.4 ms
Lead Channel Pacing Threshold Pulse Width: 0.4 ms
Lead Channel Setting Pacing Amplitude: 2.5 V
Lead Channel Setting Pacing Amplitude: 3 V
Lead Channel Setting Pacing Pulse Width: 0.4 ms
Lead Channel Setting Sensing Sensitivity: 2 mV

## 2013-11-02 LAB — POCT INR: INR: 3.4

## 2013-11-02 NOTE — Progress Notes (Signed)
PCP: Georgianne Fick, MD  [name] is a 79 y.o. male who presents today for routine electrophysiology followup.  Since last being seen in our clinic, the patient reports doing very well.  Today, he denies symptoms of palpitations, chest pain, shortness of breath,  lower extremity edema, dizziness, presyncope, or syncope.  The patient is otherwise without complaint today.   Past Medical History  Diagnosis Date  . Atrial fibrillation     coumadin Rx  . Sick sinus syndrome     s/p PPM  . Ataxia     followed by Dr. Sandria Manly  . Hyperlipidemia   . Hypothyroidism   . BPH (benign prostatic hyperplasia)   . Hypertension   . TIA (transient ischemic attack)     carotid dopplers 5/11: 0-39% bilateral; follow up due 03/2012  . Peripheral neuropathy    Past Surgical History  Procedure Laterality Date  . Pacemaker insertion      implanted 10/96, most recent Gen Change by Dr Juanda Chance 8/08    Current Outpatient Prescriptions  Medication Sig Dispense Refill  . amiodarone (PACERONE) 100 MG tablet Take 100 mg by mouth daily.        Marland Kitchen docusate sodium (COLACE) 100 MG capsule Take 100 mg by mouth daily.        Marland Kitchen levothyroxine (SYNTHROID, LEVOTHROID) 25 MCG tablet Take 25 mcg by mouth daily before breakfast.      . LORazepam (ATIVAN) 1 MG tablet Take 0.5 mg by mouth as needed. Irregular heartbeat/ anxiety      . Multiple Vitamin (MULTIVITAMIN) tablet Take 1 tablet by mouth daily.        . Nutritional Supplements (FIBER FORMULA) POWD Take by mouth 2 (two) times daily.      . simvastatin (ZOCOR) 20 MG tablet Take 20 mg by mouth at bedtime.        Marland Kitchen warfarin (COUMADIN) 5 MG tablet Take as directed by coumadin clinic  90 tablet  1  . zolpidem (AMBIEN) 10 MG tablet Take 10 mg by mouth at bedtime as needed (Pt take 1/2 tablet PRN). sleep       No current facility-administered medications for this visit.    Physical Exam: Filed Vitals:   11/02/13 1109  BP: 141/73  Pulse: 59  Height: 5\' 8"  (1.727 m)   Weight: 158 lb 12.8 oz (72.031 kg)    GEN- The patient is well appearing, alert and oriented x 3 today.   Head- normocephalic, atraumatic Eyes-  Sclera clear, conjunctiva pink Ears- hearing intact Oropharynx- clear Lungs- Clear to ausculation bilaterally, normal work of breathing Chest- pacemaker pocket is well healed Heart- Regular rate and rhythm, no murmurs, rubs or gallops, PMI not laterally displaced GI- soft, NT, ND, + BS Extremities- no clubbing, cyanosis, or edema  Pacemaker interrogation- reviewed in detail today,  See PACEART report  Assessment and Plan:  SICK SINUS SYNDROME  Normal pacemaker function  See Pace Art report  No changes today   ATRIAL FIBRILLATION  Maintaining sinus rhythm with amiodarone 100mg  daily   Continue coumadin long term, stop ASA He wishes to have his primary care physician follow TFTs and LFTs at this point Could consider decreasing amiodarone to every other day if no afib upon return in 1 year  PERIPHERAL NEUROPATHY   Followed previously by Dr Sandria Manly.  He has had longstanding neuropathy which proceeds his amiodarone. stable No changes  Return to the device clinic in 6 months I will see in a year

## 2013-11-02 NOTE — Patient Instructions (Addendum)
Your physician wants you to follow-up in: 6 months with device clinic and 12 months with Dr Allred You will receive a reminder letter in the mail two months in advance. If you don't receive a letter, please call our office to schedule the follow-up appointment.  

## 2013-11-04 DIAGNOSIS — N476 Balanoposthitis: Secondary | ICD-10-CM | POA: Diagnosis not present

## 2013-11-16 ENCOUNTER — Telehealth: Payer: Self-pay | Admitting: Neurology

## 2013-11-16 NOTE — Telephone Encounter (Signed)
Needs to be reassigned and scheduled. States he would like someone young that specializes in Neuropathy

## 2013-11-17 ENCOUNTER — Ambulatory Visit (INDEPENDENT_AMBULATORY_CARE_PROVIDER_SITE_OTHER): Payer: Medicare Other | Admitting: *Deleted

## 2013-11-17 DIAGNOSIS — Z7901 Long term (current) use of anticoagulants: Secondary | ICD-10-CM | POA: Diagnosis not present

## 2013-11-17 DIAGNOSIS — I4891 Unspecified atrial fibrillation: Secondary | ICD-10-CM | POA: Diagnosis not present

## 2013-11-22 ENCOUNTER — Ambulatory Visit (INDEPENDENT_AMBULATORY_CARE_PROVIDER_SITE_OTHER): Payer: Medicare Other | Admitting: Diagnostic Neuroimaging

## 2013-11-22 ENCOUNTER — Encounter: Payer: Self-pay | Admitting: Diagnostic Neuroimaging

## 2013-11-22 VITALS — BP 138/76 | HR 59 | Temp 97.4°F | Ht 68.5 in | Wt 158.0 lb

## 2013-11-22 DIAGNOSIS — R269 Unspecified abnormalities of gait and mobility: Secondary | ICD-10-CM | POA: Diagnosis not present

## 2013-11-22 DIAGNOSIS — Z8673 Personal history of transient ischemic attack (TIA), and cerebral infarction without residual deficits: Secondary | ICD-10-CM | POA: Diagnosis not present

## 2013-11-22 DIAGNOSIS — I6930 Unspecified sequelae of cerebral infarction: Secondary | ICD-10-CM | POA: Insufficient documentation

## 2013-11-22 DIAGNOSIS — G609 Hereditary and idiopathic neuropathy, unspecified: Secondary | ICD-10-CM

## 2013-11-22 NOTE — Progress Notes (Signed)
[facility]  PATIENT: [name] DOB: Jun 19, 1924  REFERRING CLINICIAN:  HISTORY FROM: patient and wife REASON FOR VISIT: new consult   HISTORICAL  CHIEF COMPLAINT:  Chief Complaint  Patient presents with  . Follow-up    Love pt, neuropathy    HISTORY OF PRESENT ILLNESS:   UPDATE 11/22/13: Since last visit, symptoms stable. Continues to workout at [facility]. Still driving and staying active. Having issues with stumbling and falling (not seeing things on the ground).  PRIOR HPI (07/08/12, Dr. Sandria Manly): 79 year old left-handed white married male with a history of balance problems for which I first saw him  10/17/2003. He has vertigo for over 40 years, right occipital stroke secondary to atrial fibrillation causing left visual field cut, involving  the right PCA in 1995, gait disorder improved after decreasing amiodarone, tachybradycardia syndrome with pacemaker, hyperlipidemia, peripheral neuropathy and a history of postural hypotension. He continues to feel somewhat dizzy and imbalanced. His right foot  involuntarily shakes when he is putting on his right shoe. This has occurred for over 5 years. When he steps on his right foot at times it shakes and gives way. He no longer gets on a ladder because it may shake. The symptoms occur intermittently and not consistently.There is no history of back pain radiating down the right leg. He has episodes of drop in blood pressure.Marland KitchenHe feels that the drop in blood pressure may be causing him to "be wobbly". He exercises by walking 2 miles per day on a treadmill. He has right hip pain. He generally feels weak. CT scan of the brain 03/09/2007 of the brain showed old left cerebellar and right occipital infarcts. Last workup for peripheral neuropathy 9/09 showed normal SEP, hemoglobin A1c, B 12, sedimentation rate, ACE, slightly elevated TSH, normal RPR.  Dr. Nicholos Johns did blood work  8./11/11 with slightly low hemoglobin 12.9 with normal CBC,  CMP, CPK, TSH,  and urinalysis.He fell at church Monday afternoon 09/19/2011 while leaning forward He struck his left shin as he jerked forward. He began having neck pain like a  "crick in the neck", extending to the right and left shoulders. 2 days later he  was seen at [facility] ER. He underwent a CT scan of the brain and cervical spine which I reviewed showing no acute abnormalities. There were chronic microvascular ischemic changes and remote right occipital and left cerebral infarcts. There was no fracture or subluxation of the cervical spine and no acute findings present. His neck pain improved. He had a fall 04/07/11 when his left leg tripped over a lawnmower. He developed "golf ball sized clots" in his left leg and  couldn't walk well on the leg for 4 weeks. Since that time his walking has been worse. He thinks his neuropathy is worse. He cannot  hold things in his hands. He has more tremor. His memory has not been as good. His balance has been poor since his stroke in 1995. He fell 06/01/2012 going up the steps without associated dizziness.  He was seen at [facility] with an impacted subcapital fracture of the proximal left humerus. He is being followed Dr. Thomasena Edis. He is using a fourposter cane in his right hand. He will start physical therapy in the  near future. He tried to decrease  amiodarone but is back on 1/2 of a 200 milligram tablet daily.  REVIEW OF SYSTEMS: Full 14 system review of systems performed and notable only for leg weakness, RLS, cough, hearing loss.  ALLERGIES: Allergies  Allergen Reactions  . Guaifenesin Er Palpitations    Pt can not take mucinex PM  . Prednisone Other (See Comments)    HIGH doses make patient feel "crazy"    HOME MEDICATIONS: Outpatient Prescriptions Prior to Visit  Medication Sig Dispense Refill  . amiodarone (PACERONE) 100 MG tablet Take 100 mg by mouth daily.        Marland Kitchen docusate sodium (COLACE) 100 MG capsule Take 100 mg by mouth daily.        Marland Kitchen  levothyroxine (SYNTHROID, LEVOTHROID) 25 MCG tablet Take 25 mcg by mouth daily before breakfast.      . LORazepam (ATIVAN) 1 MG tablet Take 0.5 mg by mouth as needed. Irregular heartbeat/ anxiety      . Multiple Vitamin (MULTIVITAMIN) tablet Take 1 tablet by mouth daily.        . Nutritional Supplements (FIBER FORMULA) POWD Take by mouth as needed.       . simvastatin (ZOCOR) 20 MG tablet Take 20 mg by mouth at bedtime.        Marland Kitchen warfarin (COUMADIN) 5 MG tablet Take as directed by coumadin clinic  90 tablet  1  . zolpidem (AMBIEN) 10 MG tablet Take 10 mg by mouth at bedtime as needed (Pt take 1/2 tablet PRN). sleep       No facility-administered medications prior to visit.    PAST MEDICAL HISTORY: Past Medical History  Diagnosis Date  . Atrial fibrillation     coumadin Rx  . Sick sinus syndrome     s/p PPM  . Ataxia     followed by Dr. Sandria Manly  . Hyperlipidemia   . Hypothyroidism   . BPH (benign prostatic hyperplasia)   . Hypertension   . TIA (transient ischemic attack)     carotid dopplers 5/11: 0-39% bilateral; follow up due 03/2012  . Peripheral neuropathy     PAST SURGICAL HISTORY: Past Surgical History  Procedure Laterality Date  . Pacemaker insertion      implanted 10/96, most recent Gen Change by Dr Juanda Chance 8/08    FAMILY HISTORY: History reviewed. No pertinent family history.  SOCIAL HISTORY:  History   Social History  . Marital Status: Married    Spouse Name: Steward Drone    Number of Children: 4  . Years of Education: College   Occupational History  . Retired     [employer]   Social History Main Topics  . Smoking status: Former Smoker    Quit date: 12/01/1977  . Smokeless tobacco: Never Used  . Alcohol Use: No     Comment: quit in 1995  . Drug Use: No  . Sexual Activity: Not on file   Other Topics Concern  . Not on file   Social History Narrative   Patient lives at home with his spouse.   Caffeine Use: 1 caffeine beverage daily      PHYSICAL EXAM  Filed Vitals:   11/22/13 1146  BP: 138/76  Pulse: 59  Temp: 97.4 F (36.3 C)  TempSrc: Oral  Height: 5' 8.5" (1.74 m)  Weight: 158 lb (71.668 kg)    Not recorded    Body mass index is 23.67 kg/(m^2).  GENERAL EXAM: Patient is in no distress; well developed, nourished and groomed; neck is supple  CARDIOVASCULAR: Regular rate and rhythm, no murmurs, no carotid bruits  NEUROLOGIC: MENTAL STATUS: awake, alert, oriented to person, place and time, recent and remote memory intact, normal attention and concentration, language fluent, comprehension intact, naming intact,  fund of knowledge appropriate CRANIAL NERVE: no papilledema on fundoscopic exam, pupils equal and reactive to light, visual fields full to confrontation, extraocular muscles intact, no nystagmus, facial sensation and strength symmetric, hearing intact, palate elevates symmetrically, uvula midline, shoulder shrug symmetric, tongue midline. MOTOR: normal bulk and tone, full strength in the BUE, BLE; MILD BILATERAL DF WEAKNESS (4/5).  SENSORY: ABSENT VIB AT ANKLES/TOES. COORDINATION: finger-nose-finger SLOW. REFLEXES: BUE 1, KNEES 1, ANKLES TRACE GAIT/STATION: WIDE GAIT, SHUFFLING, BILATERAL FOOT DROP GAIT. CANNOT WALK TOE, HEEL OR TANDEM. Romberg is negative.    DIAGNOSTIC DATA (LABS, IMAGING, TESTING) - I reviewed patient records, labs, notes, testing and imaging myself where available.  Lab Results  Component Value Date   WBC 5.0 06/01/2012   HGB 11.8* 06/01/2012   HCT 35.0* 06/01/2012   MCV 94.1 06/01/2012   PLT 169 06/01/2012      Component Value Date/Time   NA 136 06/01/2012 2033   K 4.3 06/01/2012 2033   CL 102 06/01/2012 2033   CO2 23 06/01/2012 2033   GLUCOSE 247* 06/01/2012 2033   GLUCOSE 111* 11/02/2006 0802   BUN 23 06/01/2012 2033   CREATININE 1.03 06/01/2012 2033   CALCIUM 8.4 06/01/2012 2033   PROT 6.9 10/06/2011 1441   ALBUMIN 3.6 10/06/2011 1441   AST 18 10/06/2011 1441   ALT 12 10/06/2011 1441    ALKPHOS 64 10/06/2011 1441   BILITOT 0.4 10/06/2011 1441   GFRNONAA 64* 06/01/2012 2033   GFRAA 74* 06/01/2012 2033   Lab Results  Component Value Date   CHOL 179 08/20/2010   HDL 44.40 08/20/2010   LDLCALC 108* 08/20/2010   TRIG 131.0 08/20/2010   CHOLHDL 4 08/20/2010   Lab Results  Component Value Date   HGBA1C 6.2* 12/21/2008   No results found for this basename: [id number]   Lab Results  Component Value Date   TSH 3.99 10/06/2011      ASSESSMENT AND PLAN  79 y.o. year old male here with idiopathic neuropathy and right PCA ischemic infarction. Stable.    PLAN: - PT evaluation for gait - use cane of walker - caution with driving (due to decr left visual field and bilateral foot drop weakness)  Orders Placed This Encounter  Procedures  . Ambulatory referral to Physical Therapy    Return if symptoms worsen or fail to improve, for return to PCP.    Suanne Marker, MD 11/22/2013, 12:33 PM Certified in Neurology, Neurophysiology and Neuroimaging  [facility] [address], [address] [place], [place] [address] [phone]

## 2013-12-06 ENCOUNTER — Ambulatory Visit: Payer: Medicare Other | Attending: Diagnostic Neuroimaging | Admitting: Physical Therapy

## 2013-12-06 DIAGNOSIS — G609 Hereditary and idiopathic neuropathy, unspecified: Secondary | ICD-10-CM | POA: Insufficient documentation

## 2013-12-06 DIAGNOSIS — IMO0001 Reserved for inherently not codable concepts without codable children: Secondary | ICD-10-CM | POA: Insufficient documentation

## 2013-12-06 DIAGNOSIS — R269 Unspecified abnormalities of gait and mobility: Secondary | ICD-10-CM | POA: Insufficient documentation

## 2013-12-06 DIAGNOSIS — Z8673 Personal history of transient ischemic attack (TIA), and cerebral infarction without residual deficits: Secondary | ICD-10-CM | POA: Insufficient documentation

## 2013-12-06 DIAGNOSIS — Z950 Presence of cardiac pacemaker: Secondary | ICD-10-CM | POA: Insufficient documentation

## 2013-12-08 ENCOUNTER — Ambulatory Visit (INDEPENDENT_AMBULATORY_CARE_PROVIDER_SITE_OTHER): Payer: Medicare Other | Admitting: *Deleted

## 2013-12-08 DIAGNOSIS — I4891 Unspecified atrial fibrillation: Secondary | ICD-10-CM | POA: Diagnosis not present

## 2013-12-08 DIAGNOSIS — Z7901 Long term (current) use of anticoagulants: Secondary | ICD-10-CM | POA: Diagnosis not present

## 2013-12-08 LAB — POCT INR: INR: 2.5

## 2013-12-13 ENCOUNTER — Ambulatory Visit: Payer: Medicare Other | Admitting: Physical Therapy

## 2013-12-15 DIAGNOSIS — H02059 Trichiasis without entropian unspecified eye, unspecified eyelid: Secondary | ICD-10-CM | POA: Diagnosis not present

## 2013-12-20 ENCOUNTER — Ambulatory Visit: Payer: Medicare Other | Admitting: Physical Therapy

## 2013-12-27 ENCOUNTER — Ambulatory Visit: Payer: Medicare Other | Admitting: Physical Therapy

## 2014-01-03 ENCOUNTER — Ambulatory Visit: Payer: Medicare Other | Attending: Diagnostic Neuroimaging | Admitting: Physical Therapy

## 2014-01-03 DIAGNOSIS — R269 Unspecified abnormalities of gait and mobility: Secondary | ICD-10-CM | POA: Diagnosis not present

## 2014-01-03 DIAGNOSIS — Z950 Presence of cardiac pacemaker: Secondary | ICD-10-CM | POA: Diagnosis not present

## 2014-01-03 DIAGNOSIS — G609 Hereditary and idiopathic neuropathy, unspecified: Secondary | ICD-10-CM | POA: Diagnosis not present

## 2014-01-03 DIAGNOSIS — IMO0001 Reserved for inherently not codable concepts without codable children: Secondary | ICD-10-CM | POA: Insufficient documentation

## 2014-01-03 DIAGNOSIS — Z8673 Personal history of transient ischemic attack (TIA), and cerebral infarction without residual deficits: Secondary | ICD-10-CM | POA: Insufficient documentation

## 2014-01-05 ENCOUNTER — Ambulatory Visit (INDEPENDENT_AMBULATORY_CARE_PROVIDER_SITE_OTHER): Payer: Medicare Other | Admitting: *Deleted

## 2014-01-05 DIAGNOSIS — Z7901 Long term (current) use of anticoagulants: Secondary | ICD-10-CM

## 2014-01-05 DIAGNOSIS — I4891 Unspecified atrial fibrillation: Secondary | ICD-10-CM

## 2014-01-05 DIAGNOSIS — Z5181 Encounter for therapeutic drug level monitoring: Secondary | ICD-10-CM

## 2014-01-05 LAB — POCT INR: INR: 2.8

## 2014-01-10 ENCOUNTER — Ambulatory Visit: Payer: Medicare Other | Admitting: Physical Therapy

## 2014-01-17 ENCOUNTER — Ambulatory Visit: Payer: Medicare Other | Admitting: Physical Therapy

## 2014-01-24 ENCOUNTER — Ambulatory Visit: Payer: Medicare Other | Admitting: Physical Therapy

## 2014-01-31 ENCOUNTER — Ambulatory Visit: Payer: Medicare Other | Attending: Diagnostic Neuroimaging | Admitting: Physical Therapy

## 2014-01-31 DIAGNOSIS — IMO0001 Reserved for inherently not codable concepts without codable children: Secondary | ICD-10-CM | POA: Insufficient documentation

## 2014-01-31 DIAGNOSIS — G609 Hereditary and idiopathic neuropathy, unspecified: Secondary | ICD-10-CM | POA: Insufficient documentation

## 2014-01-31 DIAGNOSIS — Z8673 Personal history of transient ischemic attack (TIA), and cerebral infarction without residual deficits: Secondary | ICD-10-CM | POA: Insufficient documentation

## 2014-01-31 DIAGNOSIS — Z950 Presence of cardiac pacemaker: Secondary | ICD-10-CM | POA: Insufficient documentation

## 2014-01-31 DIAGNOSIS — R269 Unspecified abnormalities of gait and mobility: Secondary | ICD-10-CM | POA: Diagnosis not present

## 2014-02-06 ENCOUNTER — Ambulatory Visit (INDEPENDENT_AMBULATORY_CARE_PROVIDER_SITE_OTHER): Payer: Medicare Other | Admitting: *Deleted

## 2014-02-06 DIAGNOSIS — I4891 Unspecified atrial fibrillation: Secondary | ICD-10-CM | POA: Diagnosis not present

## 2014-02-06 DIAGNOSIS — Z7901 Long term (current) use of anticoagulants: Secondary | ICD-10-CM | POA: Diagnosis not present

## 2014-02-06 DIAGNOSIS — Z5181 Encounter for therapeutic drug level monitoring: Secondary | ICD-10-CM

## 2014-02-06 LAB — POCT INR: INR: 2.3

## 2014-02-07 ENCOUNTER — Encounter: Payer: Self-pay | Admitting: Internal Medicine

## 2014-02-07 DIAGNOSIS — I495 Sick sinus syndrome: Secondary | ICD-10-CM | POA: Diagnosis not present

## 2014-02-14 ENCOUNTER — Ambulatory Visit: Payer: Medicare Other | Admitting: Physical Therapy

## 2014-02-28 ENCOUNTER — Ambulatory Visit: Payer: Medicare Other | Admitting: Physical Therapy

## 2014-03-06 ENCOUNTER — Ambulatory Visit (INDEPENDENT_AMBULATORY_CARE_PROVIDER_SITE_OTHER): Payer: Medicare Other

## 2014-03-06 DIAGNOSIS — Z7901 Long term (current) use of anticoagulants: Secondary | ICD-10-CM

## 2014-03-06 DIAGNOSIS — Z5181 Encounter for therapeutic drug level monitoring: Secondary | ICD-10-CM | POA: Diagnosis not present

## 2014-03-06 DIAGNOSIS — I4891 Unspecified atrial fibrillation: Secondary | ICD-10-CM | POA: Diagnosis not present

## 2014-03-06 LAB — POCT INR: INR: 2.1

## 2014-03-14 ENCOUNTER — Ambulatory Visit: Payer: Medicare Other | Attending: Diagnostic Neuroimaging | Admitting: Physical Therapy

## 2014-03-14 DIAGNOSIS — Z950 Presence of cardiac pacemaker: Secondary | ICD-10-CM | POA: Diagnosis not present

## 2014-03-14 DIAGNOSIS — Z8673 Personal history of transient ischemic attack (TIA), and cerebral infarction without residual deficits: Secondary | ICD-10-CM | POA: Insufficient documentation

## 2014-03-14 DIAGNOSIS — G609 Hereditary and idiopathic neuropathy, unspecified: Secondary | ICD-10-CM | POA: Insufficient documentation

## 2014-03-14 DIAGNOSIS — IMO0001 Reserved for inherently not codable concepts without codable children: Secondary | ICD-10-CM | POA: Diagnosis not present

## 2014-03-14 DIAGNOSIS — R269 Unspecified abnormalities of gait and mobility: Secondary | ICD-10-CM | POA: Insufficient documentation

## 2014-03-20 ENCOUNTER — Other Ambulatory Visit: Payer: Self-pay | Admitting: Internal Medicine

## 2014-04-04 ENCOUNTER — Ambulatory Visit (INDEPENDENT_AMBULATORY_CARE_PROVIDER_SITE_OTHER): Payer: Medicare Other | Admitting: *Deleted

## 2014-04-04 DIAGNOSIS — Z7901 Long term (current) use of anticoagulants: Secondary | ICD-10-CM | POA: Diagnosis not present

## 2014-04-04 DIAGNOSIS — I4891 Unspecified atrial fibrillation: Secondary | ICD-10-CM | POA: Diagnosis not present

## 2014-04-04 DIAGNOSIS — Z5181 Encounter for therapeutic drug level monitoring: Secondary | ICD-10-CM | POA: Diagnosis not present

## 2014-04-04 LAB — POCT INR: INR: 2.9

## 2014-04-12 ENCOUNTER — Other Ambulatory Visit: Payer: Self-pay | Admitting: *Deleted

## 2014-04-12 ENCOUNTER — Telehealth: Payer: Self-pay

## 2014-04-12 MED ORDER — AMIODARONE HCL 200 MG PO TABS: 100.0000 mg | ORAL_TABLET | Freq: Every day | ORAL | Status: DC

## 2014-04-12 NOTE — Telephone Encounter (Signed)
Patient called wanting a rx for his pacerone [facility] [place] to [phone]

## 2014-04-13 NOTE — Telephone Encounter (Signed)
Error

## 2014-04-17 ENCOUNTER — Other Ambulatory Visit: Payer: Self-pay | Admitting: *Deleted

## 2014-04-17 MED ORDER — AMIODARONE HCL 200 MG PO TABS: 100.0000 mg | ORAL_TABLET | Freq: Every day | ORAL | Status: DC

## 2014-05-15 ENCOUNTER — Encounter: Payer: Self-pay | Admitting: *Deleted

## 2014-05-16 ENCOUNTER — Ambulatory Visit (INDEPENDENT_AMBULATORY_CARE_PROVIDER_SITE_OTHER): Payer: Medicare Other | Admitting: Pharmacist Clinician (PhC)/ Clinical Pharmacy Specialist

## 2014-05-16 DIAGNOSIS — Z7901 Long term (current) use of anticoagulants: Secondary | ICD-10-CM | POA: Diagnosis not present

## 2014-05-16 DIAGNOSIS — I4891 Unspecified atrial fibrillation: Secondary | ICD-10-CM

## 2014-05-16 DIAGNOSIS — Z5181 Encounter for therapeutic drug level monitoring: Secondary | ICD-10-CM | POA: Diagnosis not present

## 2014-05-16 LAB — POCT INR: INR: 2.7

## 2014-05-18 ENCOUNTER — Encounter: Payer: Self-pay | Admitting: Internal Medicine

## 2014-05-18 DIAGNOSIS — I495 Sick sinus syndrome: Secondary | ICD-10-CM

## 2014-05-31 ENCOUNTER — Telehealth: Payer: Self-pay | Admitting: *Deleted

## 2014-05-31 ENCOUNTER — Ambulatory Visit (INDEPENDENT_AMBULATORY_CARE_PROVIDER_SITE_OTHER): Payer: Medicare Other | Admitting: *Deleted

## 2014-05-31 ENCOUNTER — Ambulatory Visit (INDEPENDENT_AMBULATORY_CARE_PROVIDER_SITE_OTHER): Payer: Medicare Other | Admitting: Emergency Medicine

## 2014-05-31 VITALS — BP 154/72 | HR 60 | Temp 97.9°F | Resp 18 | Ht 67.5 in | Wt 158.6 lb

## 2014-05-31 DIAGNOSIS — Z7901 Long term (current) use of anticoagulants: Secondary | ICD-10-CM

## 2014-05-31 DIAGNOSIS — S40029A Contusion of unspecified upper arm, initial encounter: Secondary | ICD-10-CM

## 2014-05-31 DIAGNOSIS — I4891 Unspecified atrial fibrillation: Secondary | ICD-10-CM | POA: Diagnosis not present

## 2014-05-31 DIAGNOSIS — S51809A Unspecified open wound of unspecified forearm, initial encounter: Secondary | ICD-10-CM

## 2014-05-31 DIAGNOSIS — S51802A Unspecified open wound of left forearm, initial encounter: Secondary | ICD-10-CM

## 2014-05-31 DIAGNOSIS — Z5181 Encounter for therapeutic drug level monitoring: Secondary | ICD-10-CM

## 2014-05-31 DIAGNOSIS — S40022A Contusion of left upper arm, initial encounter: Secondary | ICD-10-CM

## 2014-05-31 LAB — POCT INR: INR: 2.9

## 2014-05-31 NOTE — Progress Notes (Signed)
   Subjective:    Patient ID: [name], male    DOB: 1924-02-07, 80 y.o.   MRN: [id number]  HPI patient was in his usual state of health until about 3 AM this morning when he tripped and fell striking the back of his left arm his lower left arm and forearm. He sustained lacerations to the back of his left arm forearm and lower left arm. He denies being dizzy. He is on Coumadin. He did not strike his head. He states he feels fine except for the wounds on his left arm. He is up-to-date on tetanus.    Review of Systems     Objective:   Physical Exam there are avulsions of the skin involving the left forearm and left lower arm. Xeroform was applied to the wound of the left forearm which measures approximately 2 x 10 cm. The wound on the left lower arm had the skin approximated and Steri-Strips were applied. There is a 2 cm laceration posterior left upper arm which will be repaired. Patient had good range of motion of the left arm. He was moving his arm well. He also had good grip strength of the left hand        Assessment & Plan:  The lower wound was covered with Xeroform the middle wound was covered with Steri-Strips the upper wound was repaired with suture. Tetanus is up-to-date. Advised him to followup on Friday with either his PCP or at our office this weekend

## 2014-05-31 NOTE — Progress Notes (Signed)
Patient ID: [name] MRN: [id number], DOB: 02/07/1924, 80 y.o. Date of Encounter: 05/31/2014, 5:32 PM   PROCEDURE NOTE: Verbal consent obtained. Sterile technique employed. Numbing: Anesthesia obtained with 2% lidocaine with epinephrine.   Cleansed with soap and water. Irrigated.  Wound explored, no deep structures involved, no foreign bodies.   Wound repaired with # 6 SI sutures using 4-0 ethilon. #3 steri strips applied to wound edges over skin tear.  Hemostasis obtained. Wound cleansed and dressed.  Wound care instructions including precautions covered with patient. Handout given.  Anticipate suture removal in 10-12 days  Georgiann Mccoy, PA-C 05/31/2014 5:32 PM

## 2014-05-31 NOTE — Patient Instructions (Signed)
WOUND CARE Please return in 10-12 days to have your stitches/staples removed or sooner if you have concerns. Marland Kitchen Keep area clean and dry for 24 hours. Do not remove bandage, if applied. . After 24 hours, remove bandage and wash wound gently with mild soap and warm water. Reapply a new bandage after cleaning wound, if directed. . Continue daily cleansing with soap and water until stitches/staples are removed. . Do not apply any ointments or creams to the wound while stitches/staples are in place, as this may cause delayed healing. . Notify the office if you experience any of the following signs of infection: Swelling, redness, pus drainage, streaking, fever >101.0 F . Notify the office if you experience excessive bleeding that does not stop after 15-20 minutes of constant, firm pressure.

## 2014-05-31 NOTE — Progress Notes (Signed)
   Subjective:    Patient ID: [name], male    DOB: 1923-12-10, 80 y.o.   MRN: [id number]  HPI 80 year old male presents to [facility] with lacerations to left arm fall last night tripped over rug and hit against sink  On coumadin Review of Systems     Objective:   Physical Exam        Assessment & Plan:

## 2014-05-31 NOTE — Telephone Encounter (Signed)
Received call from pt stating that he took extra tablet of coumadin on Monday night and that last night tripped on rug and fell cut arm and states bleed and he is very concerned that his INR is to high so appt made for him to be seen in clinic today

## 2014-06-03 ENCOUNTER — Ambulatory Visit (INDEPENDENT_AMBULATORY_CARE_PROVIDER_SITE_OTHER): Payer: Medicare Other | Admitting: Emergency Medicine

## 2014-06-03 VITALS — BP 134/66 | HR 60 | Temp 97.7°F | Resp 16 | Ht 67.5 in | Wt 159.2 lb

## 2014-06-03 DIAGNOSIS — S51809A Unspecified open wound of unspecified forearm, initial encounter: Secondary | ICD-10-CM | POA: Diagnosis not present

## 2014-06-03 DIAGNOSIS — S51802A Unspecified open wound of left forearm, initial encounter: Secondary | ICD-10-CM

## 2014-06-03 DIAGNOSIS — Z5189 Encounter for other specified aftercare: Secondary | ICD-10-CM | POA: Diagnosis not present

## 2014-06-03 NOTE — Progress Notes (Signed)
Subjective:  This chart was scribed for [name] A. Brolin Dambrosia MD,   by Stacy Gardner, [facility]. The patient was seen in room 10 and the patient's care was started at 8:32 AM.  Patient ID: [name], male    DOB: 17-May-1924, 80 y.o.   MRN: [id number]   Chief Complaint  Patient presents with  . wound care    recheck right arm    HPI HPI Comments: [name] is a 80 y.o. male who arrives to the [facility] for a wound check of his left lower and upper lateral forearm after hitting the counter during a fall that occured three days ago. He has itching to the site and denies pain. The site is healing well.  Pt is taking Coumadin.   Patient Active Problem List   Diagnosis Date Noted  . Encounter for therapeutic drug monitoring 01/05/2014  . Gait difficulty 11/22/2013  . Chronic ischemic right PCA stroke 11/22/2013  . Dizziness 08/27/2011  . Shortness of breath 08/27/2011  . 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 EDEMA 08/20/2010  . PERIPHERAL NEUROPATHY 07/18/2010  . ABRASION/FRICION BURN OTH MX&UNS SITE W/O INF 09/25/2009  . PACEMAKER, PERMANENT 09/25/2009  . PREMATURE VENTRICULAR CONTRACTIONS 02/23/2009  . BRADYCARDIA 02/23/2009  . ATAXIA 02/23/2009  . URINARY URGENCY 09/28/2008  . THYROID FUNCTION TEST, ABNORMAL 09/28/2008  . ANXIETY 08/03/2008  . BENIGN PROSTATIC HYPERTROPHY 08/03/2008  . INSOMNIA-SLEEP DISORDER-UNSPEC 08/03/2008  . FATIGUE 08/03/2008  . HYPOTHYROIDISM 10/26/2007  . PERIPHERAL VASCULAR DISEASE 10/26/2007  . ALLERGIC RHINITIS 10/26/2007  . HYPERTENSION, BENIGN ESSENTIAL 10/25/2007  . GROSS HEMATURIA 10/25/2007  . ATRIAL FIBRILLATION 08/26/2007  . HYPERLIPIDEMIA 08/23/2007  . SICK SINUS SYNDROME 08/23/2007  . CAROTID ARTERY STENOSIS 08/23/2007  . Personal History of Unspecified Circulatory Disease 08/23/2007   Past Medical History  Diagnosis Date  . Atrial fibrillation     coumadin Rx    . Sick sinus syndrome     s/p PPM  . Ataxia     followed by Dr. Erling Cruz  . Hyperlipidemia   . Hypothyroidism   . BPH (benign prostatic hyperplasia)   . Hypertension   . TIA (transient ischemic attack)     carotid dopplers 5/11: 0-39% bilateral; follow up due 03/2012  . Peripheral neuropathy    Past Surgical History  Procedure Laterality Date  . Pacemaker insertion      implanted 10/96, most recent Gen Change by Dr Olevia Perches 8/08   Allergies  Allergen Reactions  . Guaifenesin Er Palpitations    Pt can not take mucinex PM  . Prednisone Other (See Comments)    HIGH doses make patient feel "crazy"   Prior to Admission medications   Medication Sig Start Date End Date Taking? Authorizing Provider  amiodarone (PACERONE) 200 MG tablet Take 0.5 tablets (100 mg total) by mouth daily. 04/17/14  Yes Thompson Grayer, MD  docusate sodium (COLACE) 100 MG capsule Take 100 mg by mouth daily.     Yes Historical Provider, MD  levothyroxine (SYNTHROID, LEVOTHROID) 25 MCG tablet Take 25 mcg by mouth daily before breakfast.   Yes Historical Provider, MD  LORazepam (ATIVAN) 1 MG tablet Take 0.5 mg by mouth as needed. Irregular heartbeat/ anxiety   Yes Historical Provider, MD  Multiple Vitamin (MULTIVITAMIN) tablet Take 1 tablet by mouth daily.     Yes Historical Provider, MD  Nutritional Supplements (FIBER FORMULA) POWD Take by mouth as needed.    Yes  Historical Provider, MD  simvastatin (ZOCOR) 20 MG tablet Take 20 mg by mouth at bedtime.     Yes Historical Provider, MD  warfarin (COUMADIN) 5 MG tablet TAKE AS DIRECTED BY COUMADIN CLINIC. 03/20/14  Yes Thompson Grayer, MD  zolpidem (AMBIEN) 10 MG tablet Take 10 mg by mouth at bedtime as needed (Pt take 1/2 tablet PRN). sleep   Yes Historical Provider, MD   History   Social History  . Marital Status: Married    Spouse Name: Hassan Rowan    Number of Children: 4  . Years of Education: College   Occupational History  . Retired     [employer]   Social  History Main Topics  . Smoking status: Former Smoker    Quit date: 12/01/1977  . Smokeless tobacco: Never Used  . Alcohol Use: No     Comment: quit in 1995  . Drug Use: No  . Sexual Activity: Not on file   Other Topics Concern  . Not on file   Social History Narrative   Patient lives at home with his spouse.   Caffeine Use: 1 caffeine beverage daily      Review of Systems  Skin: Positive for wound.  Hematological: Bruises/bleeds easily.       Objective:   Physical Exam CONSTITUTIONAL: Well developed/well nourished HEAD: Normocephalic/atraumatic EYES: EOMI/PERRL ENMT: Mucous membranes moist NECK: supple no meningeal signs SPINE:entire spine nontender CV: S1/S2 noted, no murmurs/rubs/gallops noted LUNGS: Lungs are clear to auscultation bilaterally, no apparent distress ABDOMEN: soft, nontender, no rebound or guarding GU:no cva tenderness NEURO: Pt is awake/alert, moves all extremitiesx4 EXTREMITIES: pulses normal, full ROM SKIN: warm, color normal healing 2 cm laceration on the left upper arm. 6 cm x 6 cm flap laceration on there forearm with a steri strip. 1 cm x6 cm open wound on left mid forearm. No redness. No drainage. No evidence of infection.  PSYCH: no abnormalities of mood noted  Filed Vitals:   06/03/14 0821  BP: 134/66  Pulse: 60  Temp: 97.7 F (36.5 C)  TempSrc: Oral  Resp: 16  Height: 5' 7.5" (1.715 m)  Weight: 159 lb 3.2 oz (72.213 kg)  SpO2: 96%   DIAGNOSTIC STUDIES: Oxygen Saturation is 96% on room air, normal by my interpretation.    COORDINATION OF CARE:  8:35 AM Discussed course of care with pt which includes Xeroform application to his lower left forearm and redress the site . Advised pt to return in six days. Pt understands and agrees.        Assessment & Plan:  Wounds look good. There is no evidence of infection. We'll recheck next Friday to remove his stitches at that time. He is to keep the areas clean and dry.

## 2014-06-06 DIAGNOSIS — H02059 Trichiasis without entropian unspecified eye, unspecified eyelid: Secondary | ICD-10-CM | POA: Diagnosis not present

## 2014-06-06 DIAGNOSIS — H2510 Age-related nuclear cataract, unspecified eye: Secondary | ICD-10-CM | POA: Diagnosis not present

## 2014-06-07 ENCOUNTER — Telehealth: Payer: Self-pay | Admitting: Internal Medicine

## 2014-06-07 DIAGNOSIS — I6529 Occlusion and stenosis of unspecified carotid artery: Secondary | ICD-10-CM

## 2014-06-07 NOTE — Telephone Encounter (Addendum)
New message     Pt got a letter stating he needs to call and schedule an echo.  He want to know if it is ok to wait until he sees Dr Rayann Heman in December?  He has no idea who is ordering the echo

## 2014-06-07 NOTE — Telephone Encounter (Signed)
Spoke with pt, explained the test he needs is a carotid doppler. Testing scheduled

## 2014-06-09 ENCOUNTER — Ambulatory Visit (INDEPENDENT_AMBULATORY_CARE_PROVIDER_SITE_OTHER): Payer: Medicare Other | Admitting: Emergency Medicine

## 2014-06-09 VITALS — BP 100/60 | HR 69 | Temp 97.5°F | Resp 16 | Ht 67.5 in | Wt 159.0 lb

## 2014-06-09 DIAGNOSIS — Z5189 Encounter for other specified aftercare: Secondary | ICD-10-CM

## 2014-06-09 DIAGNOSIS — S51802D Unspecified open wound of left forearm, subsequent encounter: Secondary | ICD-10-CM

## 2014-06-09 DIAGNOSIS — S51809A Unspecified open wound of unspecified forearm, initial encounter: Secondary | ICD-10-CM

## 2014-06-09 NOTE — Progress Notes (Signed)
   Subjective:    Patient ID: [name], male    DOB: [birth year], 80 y.o.   MRN: [id number]   PCP: Merrilee Seashore, MD  Chief Complaint  Patient presents with  . Suture / Staple Removal    HPI  Presents for suture removal.  He fell at home and came in 05/31/2014 for wound care.  He had an upper arm wound closed with sutures, another with steri-strips and a forearm wound covered with xeroform.  He's doing well, without problems.  He's concerned about bleeding with dressing changes due to use of coumadin. No recent active bleeding.  Review of Systems     Objective:   Physical Exam  Dressings removed.  Excellent healing, without active bleeding or evidence of infection. #6 SI Ethilon sutures removed. Xeroform removed from the wound on the forearm and washed with soap and water.  He has good healing, and a dry dressing is placed to protect until he gets home today.      Assessment & Plan:  Wound, open, forearm, left, subsequent encounter  Wash daily with soap and water.  Leave wounds open to the air.  OK to apply antibiotic ointment, in a thin layer, once or twice daily.

## 2014-06-13 ENCOUNTER — Ambulatory Visit (HOSPITAL_COMMUNITY): Payer: Medicare Other | Attending: Cardiology | Admitting: *Deleted

## 2014-06-13 DIAGNOSIS — Z87891 Personal history of nicotine dependence: Secondary | ICD-10-CM | POA: Diagnosis not present

## 2014-06-13 DIAGNOSIS — I10 Essential (primary) hypertension: Secondary | ICD-10-CM | POA: Insufficient documentation

## 2014-06-13 DIAGNOSIS — R279 Unspecified lack of coordination: Secondary | ICD-10-CM | POA: Insufficient documentation

## 2014-06-13 DIAGNOSIS — I2510 Atherosclerotic heart disease of native coronary artery without angina pectoris: Secondary | ICD-10-CM | POA: Diagnosis not present

## 2014-06-13 DIAGNOSIS — Z8673 Personal history of transient ischemic attack (TIA), and cerebral infarction without residual deficits: Secondary | ICD-10-CM | POA: Diagnosis not present

## 2014-06-13 DIAGNOSIS — I6529 Occlusion and stenosis of unspecified carotid artery: Secondary | ICD-10-CM | POA: Insufficient documentation

## 2014-06-13 DIAGNOSIS — E785 Hyperlipidemia, unspecified: Secondary | ICD-10-CM | POA: Insufficient documentation

## 2014-06-13 NOTE — Progress Notes (Signed)
Carotid Duplex Complete 

## 2014-06-27 ENCOUNTER — Telehealth: Payer: Self-pay

## 2014-06-27 NOTE — Telephone Encounter (Signed)
Spoke to patient. Offered earlier appt. Patient confirmed.

## 2014-07-03 ENCOUNTER — Encounter: Payer: Self-pay | Admitting: Diagnostic Neuroimaging

## 2014-07-03 ENCOUNTER — Ambulatory Visit (INDEPENDENT_AMBULATORY_CARE_PROVIDER_SITE_OTHER): Payer: Medicare Other | Admitting: Diagnostic Neuroimaging

## 2014-07-03 VITALS — BP 115/64 | HR 59 | Temp 97.6°F | Ht 68.0 in | Wt 156.4 lb

## 2014-07-03 DIAGNOSIS — G609 Hereditary and idiopathic neuropathy, unspecified: Secondary | ICD-10-CM | POA: Diagnosis not present

## 2014-07-03 DIAGNOSIS — R350 Frequency of micturition: Secondary | ICD-10-CM | POA: Diagnosis not present

## 2014-07-03 DIAGNOSIS — I6529 Occlusion and stenosis of unspecified carotid artery: Secondary | ICD-10-CM | POA: Diagnosis not present

## 2014-07-03 DIAGNOSIS — R269 Unspecified abnormalities of gait and mobility: Secondary | ICD-10-CM

## 2014-07-03 NOTE — Progress Notes (Signed)
[facility]  PATIENT: [name] DOB: [birth year]  REFERRING CLINICIAN:  HISTORY FROM: patient and wife REASON FOR VISIT: new consult   HISTORICAL  CHIEF COMPLAINT:  Chief Complaint  Patient presents with  . Follow-up    sx(s) increase    HISTORY OF PRESENT ILLNESS:   UPDATE 07/03/14: Since last visit, having more balance diff. No falls. No pain.   UPDATE 11/22/13: Since last visit, symptoms stable. Continues to workout at [facility]. Still driving and staying active. Having issues with stumbling and falling (not seeing things on the ground).  PRIOR HPI (07/08/12, Dr. Erling Cruz): 80 year old left-handed white married male with a history of balance problems for which I first saw him  10/17/2003. He has vertigo for over 40 years, right occipital stroke secondary to atrial fibrillation causing left visual field cut, involving  the right PCA in 1995, gait disorder improved after decreasing amiodarone, tachybradycardia syndrome with pacemaker, hyperlipidemia, peripheral neuropathy and a history of postural hypotension. He continues to feel somewhat dizzy and imbalanced. His right foot  involuntarily shakes when he is putting on his right shoe. This has occurred for over 5 years. When he steps on his right foot at times it shakes and gives way. He no longer gets on a ladder because it may shake. The symptoms occur intermittently and not consistently.There is no history of back pain radiating down the right leg. He has episodes of drop in blood pressure.Marland KitchenHe feels that the drop in blood pressure may be causing him to "be wobbly". He exercises by walking 2 miles per day on a treadmill. He has right hip pain. He generally feels weak. CT scan of the brain 03/09/2007 of the brain showed old left cerebellar and right occipital infarcts. Last workup for peripheral neuropathy 9/09 showed normal SEP, hemoglobin A1c, B 12, sedimentation rate, ACE, slightly elevated TSH, normal RPR.  Dr. Ashby Dawes  did blood work  8./11/11 with slightly low hemoglobin 12.9 with normal CBC, CMP, CPK, TSH,  and urinalysis.He fell at church Monday afternoon 09/19/2011 while leaning forward He struck his left shin as he jerked forward. He began having neck pain like a  "crick in the neck", extending to the right and left shoulders. 2 days later he  was seen at [facility]. He underwent a CT scan of the brain and cervical spine which I reviewed showing no acute abnormalities. There were chronic microvascular ischemic changes and remote right occipital and left cerebral infarcts. There was no fracture or subluxation of the cervical spine and no acute findings present. His neck pain improved. He had a fall 04/07/11 when his left leg tripped over a lawnmower. He developed "golf ball sized clots" in his left leg and  couldn't walk well on the leg for 4 weeks. Since that time his walking has been worse. He thinks his neuropathy is worse. He cannot  hold things in his hands. He has more tremor. His memory has not been as good. His balance has been poor since his stroke in 1995. He fell 06/01/2012 going up the steps without associated dizziness.  He was seen at [facility] with an impacted subcapital fracture of the proximal left humerus. He is being followed Dr. Theda Sers. He is using a fourposter cane in his right hand. He will start physical therapy in the  near future. He tried to decrease  amiodarone but is back on 1/2 of a 200 milligram tablet daily.  REVIEW OF SYSTEMS: Full 14 system review of systems performed  and notable only for memory loss dizziness hearing loss trouble swallowing leg swelling.    ALLERGIES: Allergies  Allergen Reactions  . Guaifenesin Er Palpitations    Pt can not take mucinex PM  . Prednisone Other (See Comments)    HIGH doses make patient feel "crazy"    HOME MEDICATIONS: Outpatient Prescriptions Prior to Visit  Medication Sig Dispense Refill  . amiodarone (PACERONE) 200 MG tablet Take 0.5  tablets (100 mg total) by mouth daily.  60 tablet  0  . docusate sodium (COLACE) 100 MG capsule Take 100 mg by mouth daily.        Marland Kitchen levothyroxine (SYNTHROID, LEVOTHROID) 25 MCG tablet Take 25 mcg by mouth daily before breakfast.      . LORazepam (ATIVAN) 1 MG tablet Take 0.5 mg by mouth as needed. Irregular heartbeat/ anxiety      . Multiple Vitamin (MULTIVITAMIN) tablet Take 1 tablet by mouth daily.        . Nutritional Supplements (FIBER FORMULA) POWD Take by mouth as needed.       . simvastatin (ZOCOR) 20 MG tablet Take 20 mg by mouth at bedtime.        Marland Kitchen warfarin (COUMADIN) 5 MG tablet TAKE AS DIRECTED BY COUMADIN CLINIC.  90 tablet  0  . zolpidem (AMBIEN) 10 MG tablet Take 10 mg by mouth at bedtime as needed (Pt take 1/2 tablet PRN). sleep       No facility-administered medications prior to visit.    PAST MEDICAL HISTORY: Past Medical History  Diagnosis Date  . Atrial fibrillation     coumadin Rx  . Sick sinus syndrome     s/p PPM  . Ataxia     followed by Dr. Erling Cruz  . Hyperlipidemia   . Hypothyroidism   . BPH (benign prostatic hyperplasia)   . Hypertension   . TIA (transient ischemic attack)     carotid dopplers 5/11: 0-39% bilateral; follow up due 03/2012  . Peripheral neuropathy     PAST SURGICAL HISTORY: Past Surgical History  Procedure Laterality Date  . Pacemaker insertion      implanted 10/96, most recent Gen Change by Dr Olevia Perches 8/08    FAMILY HISTORY: Family History  Problem Relation Age of Onset  . Stroke Mother     SOCIAL HISTORY:  History   Social History  . Marital Status: Married    Spouse Name: Hassan Rowan    Number of Children: 4  . Years of Education: College   Occupational History  . Retired     [employer]   Social History Main Topics  . Smoking status: Former Smoker    Quit date: 12/01/1977  . Smokeless tobacco: Never Used  . Alcohol Use: No     Comment: quit in 1995  . Drug Use: No  . Sexual Activity: Not on file   Other  Topics Concern  . Not on file   Social History Narrative   Patient lives at home with his spouse.   Caffeine Use: 1 caffeine beverage daily     PHYSICAL EXAM  Filed Vitals:   07/03/14 1408  BP: 115/64  Pulse: 59  Temp: 97.6 F (36.4 C)  TempSrc: Oral  Height: 5\' 8"  (1.727 m)  Weight: 156 lb 6.4 oz (70.943 kg)    Not recorded    Body mass index is 23.79 kg/(m^2).  GENERAL EXAM: Patient is in no distress; well developed, nourished and groomed; neck is supple  CARDIOVASCULAR: Regular rate and rhythm,  no murmurs, no carotid bruits  NEUROLOGIC: MENTAL STATUS: awake, alert, language fluent, comprehension intact, naming intact, fund of knowledge appropriate CRANIAL NERVE: pupils equal and reactive to light, visual fields full to confrontation, extraocular muscles intact, no nystagmus, facial sensation and strength symmetric, hearing intact, palate elevates symmetrically, uvula midline, shoulder shrug symmetric, tongue midline. MOTOR: normal bulk and tone, full strength in the BUE, BLE; MILD BILATERAL DF WEAKNESS (4/5).  SENSORY: ABSENT VIB AT ANKLES/TOES. COORDINATION: finger-nose-finger SLOW. REFLEXES: BUE 1, KNEES 1, ANKLES TRACE GAIT/STATION: WIDE GAIT, SHUFFLING, BILATERAL FOOT DROP GAIT. CANNOT WALK TOE, HEEL OR TANDEM. Romberg is negative.    DIAGNOSTIC DATA (LABS, IMAGING, TESTING) - I reviewed patient records, labs, notes, testing and imaging myself where available.  Lab Results  Component Value Date   WBC 5.0 06/01/2012   HGB 11.8* 06/01/2012   HCT 35.0* 06/01/2012   MCV 94.1 06/01/2012   PLT 169 06/01/2012      Component Value Date/Time   NA 136 06/01/2012 2033   K 4.3 06/01/2012 2033   CL 102 06/01/2012 2033   CO2 23 06/01/2012 2033   GLUCOSE 247* 06/01/2012 2033   GLUCOSE 111* 11/02/2006 0802   BUN 23 06/01/2012 2033   CREATININE 1.03 06/01/2012 2033   CALCIUM 8.4 06/01/2012 2033   PROT 6.9 10/06/2011 1441   ALBUMIN 3.6 10/06/2011 1441   AST 18 10/06/2011 1441   ALT 12  10/06/2011 1441   ALKPHOS 64 10/06/2011 1441   BILITOT 0.4 10/06/2011 1441   GFRNONAA 64* 06/01/2012 2033   GFRAA 74* 06/01/2012 2033   Lab Results  Component Value Date   CHOL 179 08/20/2010   HDL 44.40 08/20/2010   LDLCALC 108* 08/20/2010   TRIG 131.0 08/20/2010   CHOLHDL 4 08/20/2010   Lab Results  Component Value Date   HGBA1C 6.2* 12/21/2008   No results found for this basename: [id number]   Lab Results  Component Value Date   TSH 3.99 10/06/2011      ASSESSMENT AND PLAN  80 y.o. year old male here with idiopathic neuropathy and right PCA ischemic infarction. Slight progression of balance difficulty, likely related to underlying neuropathy.   PLAN: - continue PT exercises at home - use cane or walker - caution with driving (due to decr left visual field and bilateral foot drop weakness)  Return if symptoms worsen or fail to improve.   Penni Bombard, MD 01/07/9484, 4:62 PM Certified in Neurology, Neurophysiology and Neuroimaging  [facility] [address] [place], [place] [address] [phone]

## 2014-07-03 NOTE — Patient Instructions (Signed)
Continue physical therapy exercises.  Use cane or walker.

## 2014-07-11 ENCOUNTER — Encounter: Payer: Self-pay | Admitting: *Deleted

## 2014-07-13 ENCOUNTER — Ambulatory Visit (INDEPENDENT_AMBULATORY_CARE_PROVIDER_SITE_OTHER): Payer: Medicare Other | Admitting: *Deleted

## 2014-07-13 DIAGNOSIS — I4891 Unspecified atrial fibrillation: Secondary | ICD-10-CM | POA: Diagnosis not present

## 2014-07-13 DIAGNOSIS — Z7901 Long term (current) use of anticoagulants: Secondary | ICD-10-CM

## 2014-07-13 DIAGNOSIS — Z5181 Encounter for therapeutic drug level monitoring: Secondary | ICD-10-CM | POA: Diagnosis not present

## 2014-07-13 LAB — POCT INR: INR: 2.9

## 2014-07-14 DIAGNOSIS — H04129 Dry eye syndrome of unspecified lacrimal gland: Secondary | ICD-10-CM | POA: Diagnosis not present

## 2014-07-14 DIAGNOSIS — H2589 Other age-related cataract: Secondary | ICD-10-CM | POA: Diagnosis not present

## 2014-07-26 ENCOUNTER — Ambulatory Visit: Payer: Medicare Other | Admitting: Diagnostic Neuroimaging

## 2014-07-28 ENCOUNTER — Ambulatory Visit (INDEPENDENT_AMBULATORY_CARE_PROVIDER_SITE_OTHER): Payer: Medicare Other | Admitting: *Deleted

## 2014-07-28 DIAGNOSIS — T1500XA Foreign body in cornea, unspecified eye, initial encounter: Secondary | ICD-10-CM | POA: Diagnosis not present

## 2014-07-28 DIAGNOSIS — I4891 Unspecified atrial fibrillation: Secondary | ICD-10-CM

## 2014-07-28 DIAGNOSIS — Z7901 Long term (current) use of anticoagulants: Secondary | ICD-10-CM

## 2014-07-28 DIAGNOSIS — Z5181 Encounter for therapeutic drug level monitoring: Secondary | ICD-10-CM | POA: Diagnosis not present

## 2014-07-28 DIAGNOSIS — H02059 Trichiasis without entropian unspecified eye, unspecified eyelid: Secondary | ICD-10-CM | POA: Diagnosis not present

## 2014-07-28 LAB — POCT INR: INR: 3.1

## 2014-07-31 DIAGNOSIS — H25019 Cortical age-related cataract, unspecified eye: Secondary | ICD-10-CM | POA: Diagnosis not present

## 2014-07-31 DIAGNOSIS — H21569 Pupillary abnormality, unspecified eye: Secondary | ICD-10-CM | POA: Diagnosis not present

## 2014-07-31 DIAGNOSIS — H2510 Age-related nuclear cataract, unspecified eye: Secondary | ICD-10-CM | POA: Diagnosis not present

## 2014-07-31 DIAGNOSIS — H25049 Posterior subcapsular polar age-related cataract, unspecified eye: Secondary | ICD-10-CM | POA: Diagnosis not present

## 2014-08-01 ENCOUNTER — Other Ambulatory Visit: Payer: Self-pay | Admitting: Internal Medicine

## 2014-08-10 DIAGNOSIS — H02059 Trichiasis without entropian unspecified eye, unspecified eyelid: Secondary | ICD-10-CM | POA: Diagnosis not present

## 2014-08-17 ENCOUNTER — Ambulatory Visit (INDEPENDENT_AMBULATORY_CARE_PROVIDER_SITE_OTHER): Payer: Medicare Other | Admitting: *Deleted

## 2014-08-17 DIAGNOSIS — Z7901 Long term (current) use of anticoagulants: Secondary | ICD-10-CM

## 2014-08-17 DIAGNOSIS — I4891 Unspecified atrial fibrillation: Secondary | ICD-10-CM

## 2014-08-17 DIAGNOSIS — Z5181 Encounter for therapeutic drug level monitoring: Secondary | ICD-10-CM | POA: Diagnosis not present

## 2014-08-17 LAB — POCT INR: INR: 4

## 2014-08-22 DIAGNOSIS — I495 Sick sinus syndrome: Secondary | ICD-10-CM | POA: Diagnosis not present

## 2014-08-23 DIAGNOSIS — H2510 Age-related nuclear cataract, unspecified eye: Secondary | ICD-10-CM | POA: Diagnosis not present

## 2014-08-24 ENCOUNTER — Ambulatory Visit (INDEPENDENT_AMBULATORY_CARE_PROVIDER_SITE_OTHER): Payer: Medicare Other | Admitting: *Deleted

## 2014-08-24 DIAGNOSIS — Z7901 Long term (current) use of anticoagulants: Secondary | ICD-10-CM

## 2014-08-24 DIAGNOSIS — Z5181 Encounter for therapeutic drug level monitoring: Secondary | ICD-10-CM

## 2014-08-24 DIAGNOSIS — I4891 Unspecified atrial fibrillation: Secondary | ICD-10-CM | POA: Diagnosis not present

## 2014-08-24 LAB — POCT INR: INR: 2.4

## 2014-08-28 DIAGNOSIS — H5703 Miosis: Secondary | ICD-10-CM | POA: Diagnosis not present

## 2014-08-28 DIAGNOSIS — H25019 Cortical age-related cataract, unspecified eye: Secondary | ICD-10-CM | POA: Diagnosis not present

## 2014-08-28 DIAGNOSIS — H5052 Exophoria: Secondary | ICD-10-CM | POA: Diagnosis not present

## 2014-08-28 DIAGNOSIS — H2510 Age-related nuclear cataract, unspecified eye: Secondary | ICD-10-CM | POA: Diagnosis not present

## 2014-08-28 DIAGNOSIS — H21569 Pupillary abnormality, unspecified eye: Secondary | ICD-10-CM | POA: Diagnosis not present

## 2014-08-29 DIAGNOSIS — H02059 Trichiasis without entropian unspecified eye, unspecified eyelid: Secondary | ICD-10-CM | POA: Diagnosis not present

## 2014-09-07 ENCOUNTER — Ambulatory Visit (INDEPENDENT_AMBULATORY_CARE_PROVIDER_SITE_OTHER): Payer: Medicare Other | Admitting: *Deleted

## 2014-09-07 DIAGNOSIS — I4891 Unspecified atrial fibrillation: Secondary | ICD-10-CM

## 2014-09-07 DIAGNOSIS — Z5181 Encounter for therapeutic drug level monitoring: Secondary | ICD-10-CM | POA: Diagnosis not present

## 2014-09-07 DIAGNOSIS — Z7901 Long term (current) use of anticoagulants: Secondary | ICD-10-CM

## 2014-09-07 LAB — POCT INR: INR: 2.5

## 2014-09-20 DIAGNOSIS — Z23 Encounter for immunization: Secondary | ICD-10-CM | POA: Diagnosis not present

## 2014-10-05 ENCOUNTER — Ambulatory Visit (INDEPENDENT_AMBULATORY_CARE_PROVIDER_SITE_OTHER): Payer: Medicare Other | Admitting: *Deleted

## 2014-10-05 DIAGNOSIS — I4891 Unspecified atrial fibrillation: Secondary | ICD-10-CM | POA: Diagnosis not present

## 2014-10-05 DIAGNOSIS — Z7901 Long term (current) use of anticoagulants: Secondary | ICD-10-CM | POA: Diagnosis not present

## 2014-10-05 DIAGNOSIS — Z5181 Encounter for therapeutic drug level monitoring: Secondary | ICD-10-CM

## 2014-10-05 LAB — POCT INR: INR: 3.2

## 2014-10-18 DIAGNOSIS — H02052 Trichiasis without entropian right lower eyelid: Secondary | ICD-10-CM | POA: Diagnosis not present

## 2014-10-18 DIAGNOSIS — H02055 Trichiasis without entropian left lower eyelid: Secondary | ICD-10-CM | POA: Diagnosis not present

## 2014-10-19 ENCOUNTER — Ambulatory Visit (INDEPENDENT_AMBULATORY_CARE_PROVIDER_SITE_OTHER): Payer: Medicare Other | Admitting: Pharmacist Clinician (PhC)/ Clinical Pharmacy Specialist

## 2014-10-19 DIAGNOSIS — I4891 Unspecified atrial fibrillation: Secondary | ICD-10-CM | POA: Diagnosis not present

## 2014-10-19 DIAGNOSIS — Z5181 Encounter for therapeutic drug level monitoring: Secondary | ICD-10-CM

## 2014-10-19 DIAGNOSIS — Z7901 Long term (current) use of anticoagulants: Secondary | ICD-10-CM | POA: Diagnosis not present

## 2014-10-19 LAB — POCT INR: INR: 2.7

## 2014-10-23 DIAGNOSIS — E039 Hypothyroidism, unspecified: Secondary | ICD-10-CM | POA: Diagnosis not present

## 2014-10-23 DIAGNOSIS — I4891 Unspecified atrial fibrillation: Secondary | ICD-10-CM | POA: Diagnosis not present

## 2014-10-23 DIAGNOSIS — E785 Hyperlipidemia, unspecified: Secondary | ICD-10-CM | POA: Diagnosis not present

## 2014-10-23 DIAGNOSIS — N400 Enlarged prostate without lower urinary tract symptoms: Secondary | ICD-10-CM | POA: Diagnosis not present

## 2014-10-31 DIAGNOSIS — Z8673 Personal history of transient ischemic attack (TIA), and cerebral infarction without residual deficits: Secondary | ICD-10-CM | POA: Diagnosis not present

## 2014-10-31 DIAGNOSIS — E785 Hyperlipidemia, unspecified: Secondary | ICD-10-CM | POA: Diagnosis not present

## 2014-10-31 DIAGNOSIS — G609 Hereditary and idiopathic neuropathy, unspecified: Secondary | ICD-10-CM | POA: Diagnosis not present

## 2014-10-31 DIAGNOSIS — N401 Enlarged prostate with lower urinary tract symptoms: Secondary | ICD-10-CM | POA: Diagnosis not present

## 2014-11-01 ENCOUNTER — Other Ambulatory Visit: Payer: Self-pay | Admitting: Internal Medicine

## 2014-11-01 DIAGNOSIS — L309 Dermatitis, unspecified: Secondary | ICD-10-CM | POA: Diagnosis not present

## 2014-11-01 DIAGNOSIS — L821 Other seborrheic keratosis: Secondary | ICD-10-CM | POA: Diagnosis not present

## 2014-11-01 DIAGNOSIS — R319 Hematuria, unspecified: Secondary | ICD-10-CM

## 2014-11-01 DIAGNOSIS — D044 Carcinoma in situ of skin of scalp and neck: Secondary | ICD-10-CM | POA: Diagnosis not present

## 2014-11-01 DIAGNOSIS — L570 Actinic keratosis: Secondary | ICD-10-CM | POA: Diagnosis not present

## 2014-11-01 DIAGNOSIS — X32XXXA Exposure to sunlight, initial encounter: Secondary | ICD-10-CM | POA: Diagnosis not present

## 2014-11-06 ENCOUNTER — Encounter: Payer: Self-pay | Admitting: Internal Medicine

## 2014-11-06 ENCOUNTER — Ambulatory Visit (INDEPENDENT_AMBULATORY_CARE_PROVIDER_SITE_OTHER): Payer: Medicare Other | Admitting: Internal Medicine

## 2014-11-06 ENCOUNTER — Ambulatory Visit
Admission: RE | Admit: 2014-11-06 | Discharge: 2014-11-06 | Disposition: A | Discharge status: Home / Self Care | Payer: Medicare Other | Source: Ambulatory Visit | Attending: Internal Medicine | Admitting: Internal Medicine

## 2014-11-06 VITALS — BP 140/60 | HR 60 | Ht 68.0 in | Wt 159.0 lb

## 2014-11-06 DIAGNOSIS — I495 Sick sinus syndrome: Secondary | ICD-10-CM

## 2014-11-06 DIAGNOSIS — I4891 Unspecified atrial fibrillation: Secondary | ICD-10-CM | POA: Diagnosis not present

## 2014-11-06 DIAGNOSIS — Z950 Presence of cardiac pacemaker: Secondary | ICD-10-CM | POA: Diagnosis not present

## 2014-11-06 DIAGNOSIS — I10 Essential (primary) hypertension: Secondary | ICD-10-CM | POA: Diagnosis not present

## 2014-11-06 DIAGNOSIS — I6529 Occlusion and stenosis of unspecified carotid artery: Secondary | ICD-10-CM

## 2014-11-06 DIAGNOSIS — R319 Hematuria, unspecified: Secondary | ICD-10-CM

## 2014-11-06 DIAGNOSIS — R312 Other microscopic hematuria: Secondary | ICD-10-CM | POA: Diagnosis not present

## 2014-11-06 LAB — MDC_IDC_ENUM_SESS_TYPE_INCLINIC
Battery Impedance: 1400 Ohm
Brady Statistic RA Percent Paced: 72 %
Date Time Interrogation Session: 20151207162953
Implantable Pulse Generator Model: 5826
Implantable Pulse Generator Serial Number: 1990093
Lead Channel Impedance Value: 431 Ohm
Lead Channel Pacing Threshold Amplitude: 1 V
Lead Channel Pacing Threshold Amplitude: 1.75 V
Lead Channel Pacing Threshold Pulse Width: 0.4 ms
Lead Channel Pacing Threshold Pulse Width: 0.4 ms
Lead Channel Setting Pacing Amplitude: 3 V
Lead Channel Setting Pacing Pulse Width: 0.4 ms
MDC IDC MSMT BATTERY VOLTAGE: 2.81 V
MDC IDC MSMT LEADCHNL RA IMPEDANCE VALUE: 347 Ohm
MDC IDC MSMT LEADCHNL RV SENSING INTR AMPL: 0.5 mV
MDC IDC SET LEADCHNL RA PACING AMPLITUDE: 2.5 V
MDC IDC SET LEADCHNL RV SENSING SENSITIVITY: 2 mV
MDC IDC STAT BRADY RV PERCENT PACED: 1 % — AB

## 2014-11-06 MED ORDER — AMIODARONE HCL 100 MG PO TABS: 50.0000 mg | ORAL_TABLET | Freq: Every day | ORAL | Status: DC

## 2014-11-06 NOTE — Patient Instructions (Signed)
Your physician wants you to follow-up in: 6 months in the device clinic and 12 months with Dr. Vallery Ridge will receive a reminder letter in the mail two months in advance. If you don't receive a letter, please call our office to schedule the follow-up appointment.  Your physician has recommended you make the following change in your medication:  1) Decrease Amiodarone to 50mg  daily

## 2014-11-06 NOTE — Progress Notes (Signed)
PCP: Merrilee Seashore, MD  [name] is a 80 y.o. male who presents today for routine electrophysiology followup.  Since last being seen in our clinic, the patient reports doing very well.  He has had no afib.  His neuropathy is stable.  Today, he denies symptoms of palpitations, chest pain, shortness of breath,  lower extremity edema, dizziness, presyncope, or syncope.  The patient is otherwise without complaint today.   Past Medical History  Diagnosis Date  . Atrial fibrillation     coumadin Rx  . Sick sinus syndrome     s/p PPM  . Ataxia     followed by Dr. Erling Cruz  . Hyperlipidemia   . Hypothyroidism   . BPH (benign prostatic hyperplasia)   . Hypertension   . TIA (transient ischemic attack)     carotid dopplers 5/11: 0-39% bilateral; follow up due 03/2012  . Peripheral neuropathy    Past Surgical History  Procedure Laterality Date  . Pacemaker insertion      implanted 10/96, most recent Gen Change by Dr Olevia Perches 8/08    Current Outpatient Prescriptions  Medication Sig Dispense Refill  . amiodarone (PACERONE) 200 MG tablet Take 0.5 tablets (100 mg total) by mouth daily. 60 tablet 0  . docusate sodium (COLACE) 100 MG capsule Take 100 mg by mouth daily.      Marland Kitchen levothyroxine (SYNTHROID, LEVOTHROID) 25 MCG tablet Take 25 mcg by mouth daily before breakfast.    . LORazepam (ATIVAN) 1 MG tablet Take 0.5 mg by mouth daily as needed. Irregular heartbeat/ anxiety    . Multiple Vitamin (MULTIVITAMIN) tablet Take 1 tablet by mouth daily.      . Nutritional Supplements (FIBER FORMULA) POWD Take 1 packet by mouth daily as needed (bowels).     . simvastatin (ZOCOR) 20 MG tablet Take 20 mg by mouth at bedtime.      Marland Kitchen warfarin (COUMADIN) 5 MG tablet Follow directions provided by COUMADIN CLINIC. 90 tablet 0  . zolpidem (AMBIEN) 10 MG tablet Take 10 mg by mouth at bedtime as needed (Pt take 1/2 tablet PRN). sleep     No current facility-administered medications for this visit.    Physical  Exam: Filed Vitals:   11/06/14 1120  BP: 140/60  Pulse: 60  Height: 5\' 8"  (1.727 m)  Weight: 159 lb (72.122 kg)    GEN- The patient is well appearing, alert and oriented x 3 today.   Head- normocephalic, atraumatic Eyes-  Sclera clear, conjunctiva pink Ears- hearing intact Oropharynx- clear Lungs- Clear to ausculation bilaterally, normal work of breathing Chest- pacemaker pocket is well healed Heart- Regular rate and rhythm, no murmurs, rubs or gallops, PMI not laterally displaced GI- soft, NT, ND, + BS Extremities- no clubbing, cyanosis, or edema  Pacemaker interrogation- reviewed in detail today,  See PACEART report  Assessment and Plan:  SICK SINUS SYNDROME  Normal pacemaker function  See Pace Art report  No changes today   ATRIAL FIBRILLATION  Maintaining sinus rhythm with amiodarone 100mg  daily.  Given neuropathy, I will decrease amiodarone to 50mg  daily today.  We can increase if needed in the future should AF return. Continue coumadin  He wishes to have his primary care physician follow TFTs and LFTs at this point  PERIPHERAL NEUROPATHY   Followed by neurology.   Return to the device clinic in 6 months I will see in a year

## 2014-11-16 ENCOUNTER — Ambulatory Visit (INDEPENDENT_AMBULATORY_CARE_PROVIDER_SITE_OTHER): Payer: Medicare Other

## 2014-11-16 DIAGNOSIS — I4891 Unspecified atrial fibrillation: Secondary | ICD-10-CM

## 2014-11-16 DIAGNOSIS — Z7901 Long term (current) use of anticoagulants: Secondary | ICD-10-CM | POA: Diagnosis not present

## 2014-11-16 DIAGNOSIS — Z5181 Encounter for therapeutic drug level monitoring: Secondary | ICD-10-CM | POA: Diagnosis not present

## 2014-11-16 LAB — POCT INR: INR: 2.3

## 2014-11-17 ENCOUNTER — Telehealth: Payer: Self-pay | Admitting: Internal Medicine

## 2014-11-17 NOTE — Telephone Encounter (Addendum)
New Message    Patient want to know how long his pace maker is good for /how long it last. Please call patient to let him know how long. If patient is not home he states to please leave message about the amount of time for the pacemaker if possible. Thanks.

## 2014-11-17 NOTE — Telephone Encounter (Signed)
Updated pt battery longevity estimated 3.5 - 7.78yrs.

## 2014-12-05 DIAGNOSIS — L570 Actinic keratosis: Secondary | ICD-10-CM | POA: Diagnosis not present

## 2014-12-05 DIAGNOSIS — X32XXXD Exposure to sunlight, subsequent encounter: Secondary | ICD-10-CM | POA: Diagnosis not present

## 2014-12-05 DIAGNOSIS — Z08 Encounter for follow-up examination after completed treatment for malignant neoplasm: Secondary | ICD-10-CM | POA: Diagnosis not present

## 2014-12-05 DIAGNOSIS — Z85828 Personal history of other malignant neoplasm of skin: Secondary | ICD-10-CM | POA: Diagnosis not present

## 2014-12-05 DIAGNOSIS — L219 Seborrheic dermatitis, unspecified: Secondary | ICD-10-CM | POA: Diagnosis not present

## 2014-12-21 ENCOUNTER — Ambulatory Visit (INDEPENDENT_AMBULATORY_CARE_PROVIDER_SITE_OTHER): Payer: Medicare Other

## 2014-12-21 DIAGNOSIS — I4891 Unspecified atrial fibrillation: Secondary | ICD-10-CM | POA: Diagnosis not present

## 2014-12-21 DIAGNOSIS — Z7901 Long term (current) use of anticoagulants: Secondary | ICD-10-CM | POA: Diagnosis not present

## 2014-12-21 DIAGNOSIS — Z5181 Encounter for therapeutic drug level monitoring: Secondary | ICD-10-CM

## 2014-12-21 LAB — POCT INR: INR: 2

## 2015-01-11 ENCOUNTER — Other Ambulatory Visit: Payer: Self-pay | Admitting: Internal Medicine

## 2015-01-11 NOTE — Telephone Encounter (Signed)
Please review for refill, Thank you. 

## 2015-02-01 ENCOUNTER — Ambulatory Visit (INDEPENDENT_AMBULATORY_CARE_PROVIDER_SITE_OTHER): Payer: Medicare Other | Admitting: *Deleted

## 2015-02-01 DIAGNOSIS — I4891 Unspecified atrial fibrillation: Secondary | ICD-10-CM

## 2015-02-01 DIAGNOSIS — Z5181 Encounter for therapeutic drug level monitoring: Secondary | ICD-10-CM

## 2015-02-01 DIAGNOSIS — Z7901 Long term (current) use of anticoagulants: Secondary | ICD-10-CM

## 2015-02-01 LAB — POCT INR: INR: 2.4

## 2015-02-05 DIAGNOSIS — H02055 Trichiasis without entropian left lower eyelid: Secondary | ICD-10-CM | POA: Diagnosis not present

## 2015-02-06 ENCOUNTER — Encounter: Payer: Self-pay | Admitting: Internal Medicine

## 2015-02-06 DIAGNOSIS — R001 Bradycardia, unspecified: Secondary | ICD-10-CM | POA: Diagnosis not present

## 2015-03-15 ENCOUNTER — Ambulatory Visit (INDEPENDENT_AMBULATORY_CARE_PROVIDER_SITE_OTHER): Payer: Medicare Other | Admitting: Pharmacist

## 2015-03-15 DIAGNOSIS — Z7901 Long term (current) use of anticoagulants: Secondary | ICD-10-CM

## 2015-03-15 DIAGNOSIS — Z5181 Encounter for therapeutic drug level monitoring: Secondary | ICD-10-CM

## 2015-03-15 DIAGNOSIS — I4891 Unspecified atrial fibrillation: Secondary | ICD-10-CM

## 2015-03-15 LAB — POCT INR: INR: 2.4

## 2015-03-27 ENCOUNTER — Telehealth: Payer: Self-pay | Admitting: Internal Medicine

## 2015-03-27 NOTE — Telephone Encounter (Signed)
New problem   Pt need to speak to you and would say why because it was complicated. Please call pt.

## 2015-03-27 NOTE — Telephone Encounter (Signed)
lmom for patient to return my call 

## 2015-03-28 NOTE — Telephone Encounter (Signed)
New Message   Patient is returning nurses call from yesterday. Please give patient a call back. Thanks

## 2015-03-28 NOTE — Telephone Encounter (Signed)
He had been seeing Dr Erling Cruz and since he has retired does not feel comfortable with who is following with since his retirement.  Wanted to know if [facility] had any neurologist.

## 2015-04-11 NOTE — Telephone Encounter (Addendum)
Names of Neuo MD's with [facility] are Dr. Ellouise Newer, Dr Wells Guiles Tat, Dr Pleas Koch, Dr Narda Amber  [phone] is the number.  I have spoken with his wife and asked he call me back to give him the names and numbers

## 2015-04-12 ENCOUNTER — Telehealth: Payer: Self-pay | Admitting: *Deleted

## 2015-04-12 NOTE — Telephone Encounter (Signed)
Pt called to verify he will be seen by device clinic by me. Pt knew he had appt to see Chanetta Marshall, NP. I stated if an appointment was made to see an APP, I cannot change it. Pt asked for me to verify if notes from Dr. Rayann Heman stated he must be seen by a APP. The last OV does not specify need for an APP. I told pt the type of appt he has scheduled already is likely for a specific reason.  Pt reiterated several times that, "a patient should have the freedom to choose whoever they prefer for an appointment." Pt also stated he would not accept an appt any other way. Due to no notes in Epic stating a required f/u w/ an APP, I rescheduled pt w/ device clinic.  ROV w/ Juanda Crumble 05/24/15.

## 2015-04-20 NOTE — Telephone Encounter (Signed)
Spoke with patient and wife and they are going to call to transfer from [facility] to [employer]

## 2015-04-23 ENCOUNTER — Ambulatory Visit (INDEPENDENT_AMBULATORY_CARE_PROVIDER_SITE_OTHER): Payer: Medicare Other | Admitting: *Deleted

## 2015-04-23 DIAGNOSIS — I4891 Unspecified atrial fibrillation: Secondary | ICD-10-CM | POA: Diagnosis not present

## 2015-04-23 DIAGNOSIS — Z7901 Long term (current) use of anticoagulants: Secondary | ICD-10-CM | POA: Diagnosis not present

## 2015-04-23 DIAGNOSIS — Z5181 Encounter for therapeutic drug level monitoring: Secondary | ICD-10-CM

## 2015-04-23 LAB — POCT INR: INR: 3.5

## 2015-05-14 ENCOUNTER — Ambulatory Visit (INDEPENDENT_AMBULATORY_CARE_PROVIDER_SITE_OTHER): Payer: Medicare Other | Admitting: *Deleted

## 2015-05-14 DIAGNOSIS — I4891 Unspecified atrial fibrillation: Secondary | ICD-10-CM | POA: Diagnosis not present

## 2015-05-14 DIAGNOSIS — Z5181 Encounter for therapeutic drug level monitoring: Secondary | ICD-10-CM

## 2015-05-14 DIAGNOSIS — Z7901 Long term (current) use of anticoagulants: Secondary | ICD-10-CM | POA: Diagnosis not present

## 2015-05-14 LAB — POCT INR: INR: 1.9

## 2015-05-23 ENCOUNTER — Encounter: Payer: Medicare Other | Admitting: Nurse Practitioner

## 2015-05-24 ENCOUNTER — Ambulatory Visit (INDEPENDENT_AMBULATORY_CARE_PROVIDER_SITE_OTHER): Payer: Medicare Other

## 2015-05-24 ENCOUNTER — Ambulatory Visit (INDEPENDENT_AMBULATORY_CARE_PROVIDER_SITE_OTHER): Payer: Medicare Other | Admitting: *Deleted

## 2015-05-24 DIAGNOSIS — Z7901 Long term (current) use of anticoagulants: Secondary | ICD-10-CM | POA: Diagnosis not present

## 2015-05-24 DIAGNOSIS — I4891 Unspecified atrial fibrillation: Secondary | ICD-10-CM | POA: Diagnosis not present

## 2015-05-24 DIAGNOSIS — Z950 Presence of cardiac pacemaker: Secondary | ICD-10-CM | POA: Diagnosis not present

## 2015-05-24 DIAGNOSIS — I495 Sick sinus syndrome: Secondary | ICD-10-CM

## 2015-05-24 DIAGNOSIS — Z5181 Encounter for therapeutic drug level monitoring: Secondary | ICD-10-CM

## 2015-05-24 LAB — CUP PACEART INCLINIC DEVICE CHECK
Battery Impedance: 1500 Ohm
Battery Voltage: 2.8 V
Brady Statistic RA Percent Paced: 72 %
Brady Statistic RV Percent Paced: 1 % — CL
Brady Statistic RV Percent Paced: 1 % — CL
Date Time Interrogation Session: 20160623122914
Date Time Interrogation Session: 20160623130731
Lead Channel Impedance Value: 552 Ohm
Lead Channel Impedance Value: 341 Ohm
Lead Channel Pacing Threshold Amplitude: 1.25 V
Lead Channel Pacing Threshold Amplitude: 1.5 V
Lead Channel Pacing Threshold Amplitude: 1.25 V
Lead Channel Pacing Threshold Pulse Width: 0.6 ms
Lead Channel Pacing Threshold Pulse Width: 0.4 ms
Lead Channel Sensing Intrinsic Amplitude: 1 mV
Lead Channel Sensing Intrinsic Amplitude: 12 mV
Lead Channel Sensing Intrinsic Amplitude: 1 mV
Lead Channel Setting Pacing Amplitude: 3 V
Lead Channel Setting Pacing Pulse Width: 0.4 ms
Lead Channel Setting Pacing Pulse Width: 0.4 ms
MDC IDC MSMT BATTERY IMPEDANCE: 1500 Ohm
MDC IDC MSMT BATTERY VOLTAGE: 2.8 V
MDC IDC MSMT LEADCHNL RA IMPEDANCE VALUE: 341 Ohm
MDC IDC MSMT LEADCHNL RA PACING THRESHOLD PULSEWIDTH: 0.6 ms
MDC IDC MSMT LEADCHNL RV IMPEDANCE VALUE: 552 Ohm
MDC IDC MSMT LEADCHNL RV PACING THRESHOLD AMPLITUDE: 1.5 V
MDC IDC MSMT LEADCHNL RV PACING THRESHOLD PULSEWIDTH: 0.4 ms
MDC IDC MSMT LEADCHNL RV SENSING INTR AMPL: 12 mV — AB
MDC IDC PG SERIAL: 1990093
MDC IDC SET LEADCHNL RA PACING AMPLITUDE: 2.5 V
MDC IDC SET LEADCHNL RA PACING AMPLITUDE: 2.5 V
MDC IDC SET LEADCHNL RV PACING AMPLITUDE: 3 V
MDC IDC SET LEADCHNL RV SENSING SENSITIVITY: 2 mV
MDC IDC SET LEADCHNL RV SENSING SENSITIVITY: 2 mV
MDC IDC STAT BRADY RA PERCENT PACED: 72 %
Pulse Gen Model: 5826
Pulse Gen Serial Number: 1990093

## 2015-05-24 LAB — POCT INR: INR: 2.8

## 2015-05-24 NOTE — Progress Notes (Signed)
Pacemaker check in clinic. Normal device function. Thresholds, sensing, impedances consistent with previous measurements. Device programmed to maximize longevity. No mode switch or high ventricular rates noted. Device programmed at appropriate safety margins---changed RA output from 5.0V@0 .29ms to 2.5V@0 .62ms---device had cleared atrial trends---per [employer], due to device in "frozen" state which did not clear data trends, also possible EMI exposure. Histogram distribution appropriate for patient activity level. Device programmed to optimize intrinsic conduction. Estimated longevity 3.25-28yrs. ROV w/ JA in 4mo.

## 2015-06-05 ENCOUNTER — Encounter: Payer: Self-pay | Admitting: Internal Medicine

## 2015-06-08 DIAGNOSIS — T148 Other injury of unspecified body region: Secondary | ICD-10-CM | POA: Diagnosis not present

## 2015-06-11 ENCOUNTER — Ambulatory Visit (INDEPENDENT_AMBULATORY_CARE_PROVIDER_SITE_OTHER): Payer: Medicare Other | Admitting: *Deleted

## 2015-06-11 DIAGNOSIS — Z5181 Encounter for therapeutic drug level monitoring: Secondary | ICD-10-CM | POA: Diagnosis not present

## 2015-06-11 DIAGNOSIS — Z7901 Long term (current) use of anticoagulants: Secondary | ICD-10-CM | POA: Diagnosis not present

## 2015-06-11 DIAGNOSIS — I4891 Unspecified atrial fibrillation: Secondary | ICD-10-CM | POA: Diagnosis not present

## 2015-06-11 LAB — POCT INR: INR: 2.5

## 2015-06-15 ENCOUNTER — Ambulatory Visit (INDEPENDENT_AMBULATORY_CARE_PROVIDER_SITE_OTHER): Payer: Medicare Other

## 2015-06-15 DIAGNOSIS — I4891 Unspecified atrial fibrillation: Secondary | ICD-10-CM

## 2015-06-15 DIAGNOSIS — Z5181 Encounter for therapeutic drug level monitoring: Secondary | ICD-10-CM | POA: Diagnosis not present

## 2015-06-15 DIAGNOSIS — Z7901 Long term (current) use of anticoagulants: Secondary | ICD-10-CM | POA: Diagnosis not present

## 2015-06-15 LAB — POCT INR: INR: 2.4

## 2015-07-05 DIAGNOSIS — H02055 Trichiasis without entropian left lower eyelid: Secondary | ICD-10-CM | POA: Diagnosis not present

## 2015-07-17 ENCOUNTER — Ambulatory Visit (INDEPENDENT_AMBULATORY_CARE_PROVIDER_SITE_OTHER): Payer: Medicare Other | Admitting: *Deleted

## 2015-07-17 DIAGNOSIS — Z7901 Long term (current) use of anticoagulants: Secondary | ICD-10-CM | POA: Diagnosis not present

## 2015-07-17 DIAGNOSIS — I4891 Unspecified atrial fibrillation: Secondary | ICD-10-CM | POA: Diagnosis not present

## 2015-07-17 DIAGNOSIS — Z5181 Encounter for therapeutic drug level monitoring: Secondary | ICD-10-CM

## 2015-07-17 LAB — POCT INR: INR: 2.5

## 2015-08-17 ENCOUNTER — Telehealth: Payer: Self-pay | Admitting: Internal Medicine

## 2015-08-17 DIAGNOSIS — I495 Sick sinus syndrome: Secondary | ICD-10-CM | POA: Diagnosis not present

## 2015-08-17 DIAGNOSIS — R001 Bradycardia, unspecified: Secondary | ICD-10-CM | POA: Diagnosis not present

## 2015-08-17 NOTE — Telephone Encounter (Signed)
Pt concerned about irregular heart rate measured on home BP machine. Concerned his heart is skipping at rate of 87bpm. I explained to pt his device can increase & decrease his rate intentionally with activity. Pt explained he was laying down watching TV when he measured his BP & pulse (140/87 & 89bpm). Pt takes his amiodarone & warfarin as directed. Pt strongly requesting appt today w/ device clinic while Dr. Rayann Heman available.   ROV today w/ device clinic at 3:00pm.

## 2015-08-17 NOTE — Telephone Encounter (Signed)
Follow up     Pt states he has an irregular heart beat now and want a pacemaker check while waiting for a return call from [place].

## 2015-08-17 NOTE — Telephone Encounter (Signed)
New message     Pt took an amiodarone pill 30 minutes before he called you earlier.  Now his heart is regular---60 beats per minutes.  He is not coming in this afternoon because he is doing fine. If you feel like he still needs to come in---you can call him.

## 2015-08-17 NOTE — Telephone Encounter (Signed)
Cancelled appt. Did not call pt.

## 2015-08-17 NOTE — Telephone Encounter (Signed)
New message     Patient c/o Palpitations:  High priority if patient c/o lightheadedness and shortness of breath.  1. How long have you been having palpitations? This morning  2. Are you currently experiencing lightheadedness and shortness of breath?No  3. Have you checked your BP and heart rate? (document readings)  Heart rate 76 normally runs 60; 128/77 (checked at 9:30 this morning)  4. Are you experiencing any other symptoms? Pt states no chest pain or SOB; pt states he felt a little anxious last night

## 2015-08-30 ENCOUNTER — Ambulatory Visit (INDEPENDENT_AMBULATORY_CARE_PROVIDER_SITE_OTHER): Payer: Medicare Other | Admitting: *Deleted

## 2015-08-30 DIAGNOSIS — Z5181 Encounter for therapeutic drug level monitoring: Secondary | ICD-10-CM | POA: Diagnosis not present

## 2015-08-30 DIAGNOSIS — Z7901 Long term (current) use of anticoagulants: Secondary | ICD-10-CM | POA: Diagnosis not present

## 2015-08-30 DIAGNOSIS — I4891 Unspecified atrial fibrillation: Secondary | ICD-10-CM

## 2015-08-30 LAB — POCT INR: INR: 2

## 2015-10-11 ENCOUNTER — Ambulatory Visit (INDEPENDENT_AMBULATORY_CARE_PROVIDER_SITE_OTHER): Payer: Medicare Other | Admitting: *Deleted

## 2015-10-11 DIAGNOSIS — I4891 Unspecified atrial fibrillation: Secondary | ICD-10-CM | POA: Diagnosis not present

## 2015-10-11 DIAGNOSIS — Z23 Encounter for immunization: Secondary | ICD-10-CM

## 2015-10-11 DIAGNOSIS — Z7901 Long term (current) use of anticoagulants: Secondary | ICD-10-CM

## 2015-10-11 DIAGNOSIS — Z5181 Encounter for therapeutic drug level monitoring: Secondary | ICD-10-CM | POA: Diagnosis not present

## 2015-10-11 LAB — POCT INR: INR: 2.2

## 2015-10-24 DIAGNOSIS — E782 Mixed hyperlipidemia: Secondary | ICD-10-CM | POA: Diagnosis not present

## 2015-10-29 ENCOUNTER — Other Ambulatory Visit: Payer: Self-pay | Admitting: *Deleted

## 2015-10-29 MED ORDER — WARFARIN SODIUM 5 MG PO TABS: 5.0000 mg | ORAL_TABLET | ORAL | Status: DC

## 2015-11-02 DIAGNOSIS — N401 Enlarged prostate with lower urinary tract symptoms: Secondary | ICD-10-CM | POA: Diagnosis not present

## 2015-11-02 DIAGNOSIS — I480 Paroxysmal atrial fibrillation: Secondary | ICD-10-CM | POA: Diagnosis not present

## 2015-11-02 DIAGNOSIS — G609 Hereditary and idiopathic neuropathy, unspecified: Secondary | ICD-10-CM | POA: Diagnosis not present

## 2015-11-02 DIAGNOSIS — E782 Mixed hyperlipidemia: Secondary | ICD-10-CM | POA: Diagnosis not present

## 2015-11-07 ENCOUNTER — Ambulatory Visit (INDEPENDENT_AMBULATORY_CARE_PROVIDER_SITE_OTHER): Payer: Medicare Other | Admitting: Internal Medicine

## 2015-11-07 ENCOUNTER — Telehealth: Payer: Self-pay | Admitting: Diagnostic Neuroimaging

## 2015-11-07 ENCOUNTER — Encounter: Payer: Self-pay | Admitting: Internal Medicine

## 2015-11-07 ENCOUNTER — Ambulatory Visit (INDEPENDENT_AMBULATORY_CARE_PROVIDER_SITE_OTHER): Payer: Medicare Other | Admitting: *Deleted

## 2015-11-07 VITALS — BP 130/72 | HR 67 | Ht 68.0 in | Wt 159.1 lb

## 2015-11-07 DIAGNOSIS — Z7901 Long term (current) use of anticoagulants: Secondary | ICD-10-CM

## 2015-11-07 DIAGNOSIS — I4891 Unspecified atrial fibrillation: Secondary | ICD-10-CM

## 2015-11-07 DIAGNOSIS — Z5181 Encounter for therapeutic drug level monitoring: Secondary | ICD-10-CM

## 2015-11-07 DIAGNOSIS — I480 Paroxysmal atrial fibrillation: Secondary | ICD-10-CM | POA: Diagnosis not present

## 2015-11-07 DIAGNOSIS — I495 Sick sinus syndrome: Secondary | ICD-10-CM

## 2015-11-07 LAB — POCT INR: INR: 1.7

## 2015-11-07 NOTE — Patient Instructions (Addendum)
Medication Instructions:  Your physician recommends that you continue on your current medications as directed. Please refer to the Current Medication list given to you today.   Labwork: None ordered   Testing/Procedures: None ordered   Follow-Up: Your physician wants you to follow-up in: 6 months in the device clinic and 12 months with Dr Allred You will receive a reminder letter in the mail two months in advance. If you don't receive a letter, please call our office to schedule the follow-up appointment.     Any Other Special Instructions Will Be Listed Below (If Applicable).     If you need a refill on your cardiac medications before your next appointment, please call your pharmacy.   

## 2015-11-07 NOTE — Telephone Encounter (Signed)
Spoke with patient and informed him in his last OV 07/2014 Dr Leta Baptist stated he could return if symptoms worsened or he failed to improve. Inquired if that was the case; patient never specifically answered. He stated he was past patient of Dr Erling Cruz. He states he saw his cardiologist today who recommended Dr Jaynee Eagles. Patient stated he didn't have anything against Dr Leta Baptist, just felt like "they didn't click". He stated he trusts his cardiologist, so he wants to see Dr Jaynee Eagles. This RN attempted to [place] office policies for this, but he interrupted several times. He stated "Just ask her."  Informed him Dr Jaynee Eagles is out of the office today but will discuss with her tomorrow and call him back. He verbalized understanding.

## 2015-11-07 NOTE — Progress Notes (Signed)
PCP: Merrilee Seashore, MD  [name] is a 81 y.o. male who presents today for routine electrophysiology followup.  Since last being seen in our clinic, the patient reports doing very well.  He has had no symptoms of afib.  His neuropathy is stable.  Today, he denies symptoms of palpitations, chest pain, shortness of breath,  lower extremity edema, dizziness, presyncope, or syncope.  The patient is otherwise without complaint today.   Past Medical History  Diagnosis Date  . Atrial fibrillation (HCC)     coumadin Rx  . Sick sinus syndrome (HCC)     s/p PPM  . Ataxia     followed by Dr. Erling Cruz  . Hyperlipidemia   . Hypothyroidism   . BPH (benign prostatic hyperplasia)   . Hypertension   . TIA (transient ischemic attack)     carotid dopplers 5/11: 0-39% bilateral; follow up due 03/2012  . Peripheral neuropathy [facility])    Past Surgical History  Procedure Laterality Date  . Pacemaker insertion      implanted 10/96, most recent Gen Change by Dr Olevia Perches 8/08    Current Outpatient Prescriptions  Medication Sig Dispense Refill  . amiodarone (PACERONE) 200 MG tablet Take 100 mg by mouth daily.    Marland Kitchen docusate sodium (COLACE) 100 MG capsule Take 100 mg by mouth daily.      Marland Kitchen levothyroxine (SYNTHROID, LEVOTHROID) 25 MCG tablet Take 25 mcg by mouth daily before breakfast.    . LORazepam (ATIVAN) 1 MG tablet Take 0.5 mg by mouth daily as needed. Irregular heartbeat/ anxiety    . Multiple Vitamin (MULTIVITAMIN) tablet Take 1 tablet by mouth daily.      . Nutritional Supplements (FIBER FORMULA) POWD Take 1 packet by mouth daily as needed (bowels).     . simvastatin (ZOCOR) 20 MG tablet Take 20 mg by mouth at bedtime.      Marland Kitchen warfarin (COUMADIN) 5 MG tablet Take 1 tablet (5 mg total) by mouth as directed. 90 tablet 1  . zolpidem (AMBIEN) 10 MG tablet Take 10 mg by mouth at bedtime as needed (Pt take 1/2 tablet PRN). sleep     No current facility-administered medications for this visit.     Physical Exam: Filed Vitals:   11/07/15 1412  BP: 130/72  Pulse: 67  Height: 5\' 8"  (1.727 m)  Weight: 159 lb 1.9 oz (72.176 kg)    GEN- The patient is well appearing, alert and oriented x 3 today.   Head- normocephalic, atraumatic Eyes-  Sclera clear, conjunctiva pink Ears- hearing intact Oropharynx- clear Lungs- Clear to ausculation bilaterally, normal work of breathing Chest- pacemaker pocket is well healed Heart- Regular rate and rhythm, no murmurs, rubs or gallops, PMI not laterally displaced GI- soft, NT, ND, + BS Extremities- no clubbing, cyanosis, or edema  Pacemaker interrogation- reviewed in detail today,  See PACEART report ekg today reveals sinus rhythm 67 bpm, PR 176 msec, QRS 98, Qtc 424 msec  Assessment and Plan:  SICK SINUS SYNDROME  Normal pacemaker function  See Pace Art report  No changes today   ATRIAL FIBRILLATION  Maintaining sinus rhythm with amiodarone 100mg  daily. He tried reducing amiodarone to 50mg  daily but says that he did not feel as well and thus returned to 100mg  daily. AF burden by PPM is 7%. Continue coumadin  He wishes to have his primary care physician follow TFTs and LFTs at this point.  I have stressed importance of compliance with this   Return to the device clinic  in 6 months I will see in a year  Thompson Grayer MD, [facility] 11/07/2015 2:44 PM

## 2015-11-07 NOTE — Telephone Encounter (Signed)
Pt called and would like to switch physicians. He would like to start seeing Dr. Jaynee Eagles and needs a yearly appt made. He says that Dr. Leta Baptist is fine but does not feel comfortable with him as a physician. He also expressed that he likes to have a print out at the end of each visit.please call and advise [phone]

## 2015-11-08 ENCOUNTER — Telehealth: Payer: Self-pay | Admitting: Internal Medicine

## 2015-11-08 ENCOUNTER — Encounter: Payer: Self-pay | Admitting: Internal Medicine

## 2015-11-08 ENCOUNTER — Ambulatory Visit (INDEPENDENT_AMBULATORY_CARE_PROVIDER_SITE_OTHER): Payer: Medicare Other | Admitting: *Deleted

## 2015-11-08 VITALS — BP 152/79 | HR 62

## 2015-11-08 DIAGNOSIS — Z950 Presence of cardiac pacemaker: Secondary | ICD-10-CM

## 2015-11-08 LAB — CUP PACEART INCLINIC DEVICE CHECK
Date Time Interrogation Session: 20161208140734
Implantable Lead Implant Date: 19961031
Implantable Lead Location: 753859
Implantable Lead Location: 753860
Lead Channel Impedance Value: 495 Ohm
Lead Channel Pacing Threshold Pulse Width: 0.4 ms
Lead Channel Sensing Intrinsic Amplitude: 1 mV
Lead Channel Sensing Intrinsic Amplitude: 12 mV
Lead Channel Setting Pacing Amplitude: 2 V
Lead Channel Setting Pacing Pulse Width: 0.4 ms
MDC IDC LEAD IMPLANT DT: 19961031
MDC IDC MSMT BATTERY IMPEDANCE: 1700 Ohm
MDC IDC MSMT BATTERY VOLTAGE: 2.78 V
MDC IDC MSMT LEADCHNL RA IMPEDANCE VALUE: 341 Ohm
MDC IDC MSMT LEADCHNL RA PACING THRESHOLD AMPLITUDE: 1 V
MDC IDC MSMT LEADCHNL RA PACING THRESHOLD PULSEWIDTH: 0.6 ms
MDC IDC MSMT LEADCHNL RV PACING THRESHOLD AMPLITUDE: 1.5 V
MDC IDC SET LEADCHNL RV PACING AMPLITUDE: 3 V
MDC IDC SET LEADCHNL RV SENSING SENSITIVITY: 2 mV
MDC IDC STAT BRADY RA PERCENT PACED: 75 %
MDC IDC STAT BRADY RV PERCENT PACED: 3.2 %
Pulse Gen Model: 5826
Pulse Gen Serial Number: 1990093

## 2015-11-08 NOTE — Telephone Encounter (Signed)
°  1. Has your device fired? no  2. Is you device beeping? no  3. Are you experiencing draining or swelling at device site? no  4. Are you calling to see if we received your device transmission? no  5. Have you passed out? No  Pt stated his device isn't right and he feels intense and on fire. Pt request a call back asap

## 2015-11-08 NOTE — Telephone Encounter (Signed)
Patient states that he's been feeling "tense, uptight, electrified, and unbalanced" since his appt with JA yesterday. Patient requests a device clinic appt w/ Juanda Crumble. Patient to f/u today @ 11:30am.  BP: 179/87 HR: 67bpm

## 2015-11-08 NOTE — Progress Notes (Signed)
Pt here requesting CZ only. Interrogation yesterday was normal. No changes were made. Pt concerned about elevated systolic pressure. BP taken today @ 152/79 (both arms). Device interrogated again per pt request. Normal device function. Thresholds, sensing, impedances consistent with previous measurements. Device programmed to maximize longevity. 8.2% mode switch, no EGMs---RA noise occurred intermittently during interrogation; previously known. Device programmed at appropriate safety margins---changed RA output from 2.5 to 2.0V. Histogram distribution appropriate for patient activity level. Device programmed to optimize intrinsic conduction. Estimated longevity 3-74yrs. Follow up as planned: ROV w/ device clinic in 51mo & w/ JA in 37yr. Pt agreed to monitor BP tonight & tomorrow. He agreed to contact PCP if it remains elevated. Pt did consider elevation caused by stress on 11/07/15.

## 2015-11-08 NOTE — Telephone Encounter (Signed)
Note routed to Dr Tish Frederickson and Dr Jaynee Eagles.

## 2015-11-09 ENCOUNTER — Telehealth: Payer: Self-pay | Admitting: Internal Medicine

## 2015-11-09 NOTE — Telephone Encounter (Signed)
Returned patient's call.  He was concerned about his BP yesterday after being seen in device clinic as an add-on.  His went home and immediately called his PCP to make an appointment for later that afternoon.  Patient states that his BP was "within the normal range" yesterday afternoon at the PCP's office, so this reassured him.  He states that he and his wife went out to dinner at [employer] last night and had a "great" dinner because he was so relieved.  He requests that I make Dr. Rayann Heman aware that his blood pressure was normal when rechecked.  Patient denies any questions or concerns at this time.  Call routed to Dr. Rayann Heman for review.

## 2015-11-09 NOTE — Telephone Encounter (Signed)
New message      Calling to let Juanda Crumble know that pt went to his PCP yesterday and his bp was 130/77.  It was high when Bessie checked it.

## 2015-11-09 NOTE — Telephone Encounter (Signed)
Spoke with patient and inquired again if he has any new issues, problems or worsening of his neuropathy. He stated "I don't know if I'm worse. I don't guess my neuropathy is worse. It's not better, but how would I know." He stated that he feels he "should be checked every year." Informed him that Dr Leta Baptist stated in last OV note that patient could return if symptoms worsen or fail to improve. Informed patient that the drs in this office will often release a patient to their PCP if their problems and symptoms are stable and managed with the understanding that the patient will be seen again if needed.  He stated his PCP "doesn't know anything about my legs." Patient then stated "It's okay. Let's just leave it as it is." reminded him to call if his symptoms worsen or he develops new problem. Patient verbalized thanks for call.

## 2015-11-27 DIAGNOSIS — H02055 Trichiasis without entropian left lower eyelid: Secondary | ICD-10-CM | POA: Diagnosis not present

## 2015-12-06 ENCOUNTER — Ambulatory Visit (INDEPENDENT_AMBULATORY_CARE_PROVIDER_SITE_OTHER): Payer: Medicare Other | Admitting: *Deleted

## 2015-12-06 DIAGNOSIS — I480 Paroxysmal atrial fibrillation: Secondary | ICD-10-CM

## 2015-12-06 DIAGNOSIS — I4891 Unspecified atrial fibrillation: Secondary | ICD-10-CM

## 2015-12-06 DIAGNOSIS — Z5181 Encounter for therapeutic drug level monitoring: Secondary | ICD-10-CM | POA: Diagnosis not present

## 2015-12-06 DIAGNOSIS — Z7901 Long term (current) use of anticoagulants: Secondary | ICD-10-CM | POA: Diagnosis not present

## 2015-12-06 LAB — POCT INR: INR: 2

## 2016-01-07 ENCOUNTER — Ambulatory Visit (INDEPENDENT_AMBULATORY_CARE_PROVIDER_SITE_OTHER): Payer: Medicare Other

## 2016-01-07 DIAGNOSIS — I480 Paroxysmal atrial fibrillation: Secondary | ICD-10-CM | POA: Diagnosis not present

## 2016-01-07 DIAGNOSIS — I4891 Unspecified atrial fibrillation: Secondary | ICD-10-CM

## 2016-01-07 DIAGNOSIS — Z5181 Encounter for therapeutic drug level monitoring: Secondary | ICD-10-CM | POA: Diagnosis not present

## 2016-01-07 DIAGNOSIS — Z7901 Long term (current) use of anticoagulants: Secondary | ICD-10-CM | POA: Diagnosis not present

## 2016-01-07 LAB — POCT INR: INR: 1.8

## 2016-01-21 ENCOUNTER — Ambulatory Visit (INDEPENDENT_AMBULATORY_CARE_PROVIDER_SITE_OTHER): Payer: Medicare Other | Admitting: *Deleted

## 2016-01-21 DIAGNOSIS — Z7901 Long term (current) use of anticoagulants: Secondary | ICD-10-CM

## 2016-01-21 DIAGNOSIS — Z5181 Encounter for therapeutic drug level monitoring: Secondary | ICD-10-CM | POA: Diagnosis not present

## 2016-01-21 DIAGNOSIS — I4891 Unspecified atrial fibrillation: Secondary | ICD-10-CM

## 2016-01-21 DIAGNOSIS — I480 Paroxysmal atrial fibrillation: Secondary | ICD-10-CM | POA: Diagnosis not present

## 2016-01-21 LAB — POCT INR: INR: 1.9

## 2016-01-29 ENCOUNTER — Ambulatory Visit (INDEPENDENT_AMBULATORY_CARE_PROVIDER_SITE_OTHER): Payer: Medicare Other | Admitting: *Deleted

## 2016-01-29 DIAGNOSIS — I4891 Unspecified atrial fibrillation: Secondary | ICD-10-CM

## 2016-01-29 DIAGNOSIS — Z7901 Long term (current) use of anticoagulants: Secondary | ICD-10-CM

## 2016-01-29 DIAGNOSIS — Z5181 Encounter for therapeutic drug level monitoring: Secondary | ICD-10-CM

## 2016-01-29 DIAGNOSIS — I480 Paroxysmal atrial fibrillation: Secondary | ICD-10-CM

## 2016-01-29 LAB — POCT INR: INR: 2.8

## 2016-01-30 ENCOUNTER — Encounter: Payer: Self-pay | Admitting: Internal Medicine

## 2016-01-30 DIAGNOSIS — R001 Bradycardia, unspecified: Secondary | ICD-10-CM | POA: Diagnosis not present

## 2016-01-30 DIAGNOSIS — I495 Sick sinus syndrome: Secondary | ICD-10-CM | POA: Diagnosis not present

## 2016-02-06 DIAGNOSIS — H02055 Trichiasis without entropian left lower eyelid: Secondary | ICD-10-CM | POA: Diagnosis not present

## 2016-02-12 ENCOUNTER — Ambulatory Visit (INDEPENDENT_AMBULATORY_CARE_PROVIDER_SITE_OTHER): Payer: Medicare Other

## 2016-02-12 DIAGNOSIS — Z7901 Long term (current) use of anticoagulants: Secondary | ICD-10-CM

## 2016-02-12 DIAGNOSIS — I480 Paroxysmal atrial fibrillation: Secondary | ICD-10-CM

## 2016-02-12 DIAGNOSIS — Z5181 Encounter for therapeutic drug level monitoring: Secondary | ICD-10-CM | POA: Diagnosis not present

## 2016-02-12 DIAGNOSIS — I4891 Unspecified atrial fibrillation: Secondary | ICD-10-CM

## 2016-02-12 LAB — POCT INR: INR: 2.8

## 2016-02-26 DIAGNOSIS — M1712 Unilateral primary osteoarthritis, left knee: Secondary | ICD-10-CM | POA: Diagnosis not present

## 2016-02-26 DIAGNOSIS — M25562 Pain in left knee: Secondary | ICD-10-CM | POA: Diagnosis not present

## 2016-02-29 DIAGNOSIS — M79675 Pain in left toe(s): Secondary | ICD-10-CM | POA: Diagnosis not present

## 2016-02-29 DIAGNOSIS — B351 Tinea unguium: Secondary | ICD-10-CM | POA: Diagnosis not present

## 2016-02-29 DIAGNOSIS — M79674 Pain in right toe(s): Secondary | ICD-10-CM | POA: Diagnosis not present

## 2016-03-04 ENCOUNTER — Telehealth: Payer: Self-pay | Admitting: Pharmacist

## 2016-03-04 NOTE — Telephone Encounter (Signed)
Spoke with patient.  He is having 2 toenails removed.  Podiatrist told him he did not need to stop his Coumadin and he wanted to make sure this was ok.  Assured him that this was a low bleeding risk procedure and he would be okay to continue his Coumadin as usual.

## 2016-03-04 NOTE — Telephone Encounter (Signed)
New Message  Pt requested to speak w/ Gay Filler abouit his Coumadin. Please call back and discuss.

## 2016-03-11 ENCOUNTER — Ambulatory Visit (INDEPENDENT_AMBULATORY_CARE_PROVIDER_SITE_OTHER): Payer: Medicare Other | Admitting: *Deleted

## 2016-03-11 DIAGNOSIS — Z5181 Encounter for therapeutic drug level monitoring: Secondary | ICD-10-CM

## 2016-03-11 DIAGNOSIS — I480 Paroxysmal atrial fibrillation: Secondary | ICD-10-CM | POA: Diagnosis not present

## 2016-03-11 DIAGNOSIS — Z7901 Long term (current) use of anticoagulants: Secondary | ICD-10-CM

## 2016-03-11 DIAGNOSIS — I4891 Unspecified atrial fibrillation: Secondary | ICD-10-CM

## 2016-03-11 LAB — POCT INR: INR: 2

## 2016-03-16 ENCOUNTER — Encounter (HOSPITAL_COMMUNITY): Payer: Self-pay | Admitting: Family Medicine

## 2016-03-16 ENCOUNTER — Emergency Department (HOSPITAL_COMMUNITY): Payer: Medicare Other

## 2016-03-16 ENCOUNTER — Observation Stay (HOSPITAL_COMMUNITY)
Admission: EM | Admit: 2016-03-16 | Discharge: 2016-03-18 | Disposition: A | Discharge status: Home / Self Care | Payer: Medicare Other | Attending: Internal Medicine | Admitting: Internal Medicine

## 2016-03-16 DIAGNOSIS — N401 Enlarged prostate with lower urinary tract symptoms: Secondary | ICD-10-CM | POA: Insufficient documentation

## 2016-03-16 DIAGNOSIS — E785 Hyperlipidemia, unspecified: Secondary | ICD-10-CM | POA: Diagnosis not present

## 2016-03-16 DIAGNOSIS — I495 Sick sinus syndrome: Secondary | ICD-10-CM | POA: Diagnosis not present

## 2016-03-16 DIAGNOSIS — E039 Hypothyroidism, unspecified: Secondary | ICD-10-CM | POA: Diagnosis not present

## 2016-03-16 DIAGNOSIS — I10 Essential (primary) hypertension: Secondary | ICD-10-CM | POA: Insufficient documentation

## 2016-03-16 DIAGNOSIS — Z79899 Other long term (current) drug therapy: Secondary | ICD-10-CM | POA: Diagnosis not present

## 2016-03-16 DIAGNOSIS — Z950 Presence of cardiac pacemaker: Secondary | ICD-10-CM | POA: Insufficient documentation

## 2016-03-16 DIAGNOSIS — Z9181 History of falling: Secondary | ICD-10-CM | POA: Insufficient documentation

## 2016-03-16 DIAGNOSIS — Z8673 Personal history of transient ischemic attack (TIA), and cerebral infarction without residual deficits: Secondary | ICD-10-CM | POA: Insufficient documentation

## 2016-03-16 DIAGNOSIS — M25551 Pain in right hip: Secondary | ICD-10-CM | POA: Diagnosis present

## 2016-03-16 DIAGNOSIS — N179 Acute kidney failure, unspecified: Secondary | ICD-10-CM | POA: Insufficient documentation

## 2016-03-16 DIAGNOSIS — M25559 Pain in unspecified hip: Secondary | ICD-10-CM

## 2016-03-16 DIAGNOSIS — Z87891 Personal history of nicotine dependence: Secondary | ICD-10-CM | POA: Insufficient documentation

## 2016-03-16 DIAGNOSIS — S79911A Unspecified injury of right hip, initial encounter: Secondary | ICD-10-CM | POA: Diagnosis not present

## 2016-03-16 DIAGNOSIS — Z5181 Encounter for therapeutic drug level monitoring: Secondary | ICD-10-CM

## 2016-03-16 DIAGNOSIS — R338 Other retention of urine: Secondary | ICD-10-CM | POA: Diagnosis not present

## 2016-03-16 DIAGNOSIS — Z7901 Long term (current) use of anticoagulants: Secondary | ICD-10-CM | POA: Diagnosis not present

## 2016-03-16 DIAGNOSIS — I4891 Unspecified atrial fibrillation: Secondary | ICD-10-CM | POA: Insufficient documentation

## 2016-03-16 LAB — PROTIME-INR
INR: 1.91 — AB (ref 0.00–1.49)
PROTHROMBIN TIME: 21.8 s — AB (ref 11.6–15.2)

## 2016-03-16 LAB — BASIC METABOLIC PANEL
Anion gap: 9 (ref 5–15)
BUN: 32 mg/dL — ABNORMAL HIGH (ref 6–20)
CHLORIDE: 105 mmol/L (ref 101–111)
CO2: 26 mmol/L (ref 22–32)
Calcium: 8.4 mg/dL — ABNORMAL LOW (ref 8.9–10.3)
Creatinine, Ser: 1.58 mg/dL — ABNORMAL HIGH (ref 0.61–1.24)
GFR calc non Af Amer: 37 mL/min — ABNORMAL LOW (ref >60)
GFR, EST AFRICAN AMERICAN: 43 mL/min — AB (ref >60)
Glucose, Bld: 179 mg/dL — ABNORMAL HIGH (ref 65–99)
POTASSIUM: 4.1 mmol/L (ref 3.5–5.1)
SODIUM: 140 mmol/L (ref 135–145)

## 2016-03-16 LAB — CBC WITH DIFFERENTIAL/PLATELET
BASOS PCT: 0 %
Basophils Absolute: 0 10*3/uL (ref 0.0–0.1)
EOS ABS: 0.1 10*3/uL (ref 0.0–0.7)
Eosinophils Relative: 1 %
HEMATOCRIT: 35.9 % — AB (ref 39.0–52.0)
HEMOGLOBIN: 12.2 g/dL — AB (ref 13.0–17.0)
LYMPHS ABS: 1.2 10*3/uL (ref 0.7–4.0)
Lymphocytes Relative: 15 %
MCH: 31.4 pg (ref 26.0–34.0)
MCHC: 34 g/dL (ref 30.0–36.0)
MCV: 92.3 fL (ref 78.0–100.0)
Monocytes Absolute: 0.6 10*3/uL (ref 0.1–1.0)
Monocytes Relative: 7 %
NEUTROS ABS: 6.5 10*3/uL (ref 1.7–7.7)
NEUTROS PCT: 77 %
Platelets: 175 10*3/uL (ref 150–400)
RBC: 3.89 MIL/uL — AB (ref 4.22–5.81)
RDW: 13.6 % (ref 11.5–15.5)
WBC: 8.3 10*3/uL (ref 4.0–10.5)

## 2016-03-16 NOTE — ED Notes (Signed)
Patient reports on Thursday afternoon while squatting down to put air in his tire, pt fell over and hit his right hip. Later in the afternoon, after getting out of the car, he lost his balance again and fell on the right hip again. Pt denies being dizziness or lightheaded before falling. Pt denies he didn't hit his head or any LOC. Pt complains of right hip pain and decrease in ability to ambulate.

## 2016-03-16 NOTE — ED Provider Notes (Signed)
CSN: [id number]     Arrival date & time 03/16/16  1913 History   First MD Initiated Contact with Patient 03/16/16 1956     Chief Complaint  Patient presents with  . Fall     (Consider location/radiation/quality/duration/timing/severity/associated sxs/prior Treatment) HPI 82 year old male who presents with right hip pain after fall. He has a history of sick sinus syndrome with a pacemaker, hyperlipidemia, hypertension, and atrial fibrillation on Coumadin. Reports that 4 days ago he had 2 mechanical falls where he lost balance and fell on his right side. States he has been having progressively worsening right hip pain that he has been unable to ambulate. No numbness or weakness or overlying skin changes involving his legs. No head strike, loss of consciousness, headache, nausea or vomiting, vision or speech changes, neck pain, back pain, abdominal pain, flank pain, chest pain or difficulty breathing. Past Medical History  Diagnosis Date  . Atrial fibrillation (HCC)     coumadin Rx  . Sick sinus syndrome (HCC)     s/p PPM  . Ataxia     followed by Dr. Erling Cruz  . Hyperlipidemia   . Hypothyroidism   . BPH (benign prostatic hyperplasia)   . Hypertension   . TIA (transient ischemic attack)     carotid dopplers 5/11: 0-39% bilateral; follow up due 03/2012  . Peripheral neuropathy [facility])    Past Surgical History  Procedure Laterality Date  . Pacemaker insertion      implanted 10/96, most recent Gen Change by Dr Olevia Perches 8/08   Family History  Problem Relation Age of Onset  . Stroke Mother    Social History  Substance Use Topics  . Smoking status: Former Smoker    Quit date: 12/01/1977  . Smokeless tobacco: Never Used  . Alcohol Use: No     Comment: quit in 1995    Review of Systems 10/14 systems reviewed and are negative other than those stated in the HPI    Allergies  Guaifenesin er and Prednisone  Home Medications   Prior to Admission medications   Medication Sig Start Date  End Date Taking? Authorizing Provider  acetaminophen (TYLENOL) 500 MG tablet Take 500 mg by mouth every 6 (six) hours as needed for moderate pain.   Yes Historical Provider, MD  amiodarone (PACERONE) 200 MG tablet Take 100 mg by mouth daily.   Yes Historical Provider, MD  docusate sodium (COLACE) 100 MG capsule Take 100 mg by mouth daily as needed for mild constipation.    Yes Historical Provider, MD  levothyroxine (SYNTHROID, LEVOTHROID) 25 MCG tablet Take 25 mcg by mouth daily before breakfast.   Yes Historical Provider, MD  Multiple Vitamin (MULTIVITAMIN) tablet Take 1 tablet by mouth daily.     Yes Historical Provider, MD  simvastatin (ZOCOR) 20 MG tablet Take 20 mg by mouth at bedtime.     Yes Historical Provider, MD  terbinafine (LAMISIL) 250 MG tablet Take 250 mg by mouth daily.   Yes Historical Provider, MD  warfarin (COUMADIN) 5 MG tablet Take 1 tablet (5 mg total) by mouth as directed. Patient taking differently: Take 2.5-5 mg by mouth as directed. Take 2.5 mg (0.5 tablet) on Tues, Thurs, Sat and Sun and Take 5 mg (1 tablet) on Mon, Wed and Fri 10/29/15  Yes Thompson Grayer, MD  LORazepam (ATIVAN) 1 MG tablet Take 0.5 mg by mouth daily as needed. Irregular heartbeat/ anxiety    Historical Provider, MD  Nutritional Supplements (FIBER FORMULA) POWD Take 1 packet by mouth  daily as needed (bowels).     Historical Provider, MD  zolpidem (AMBIEN) 10 MG tablet Take 5 mg by mouth at bedtime as needed for sleep. sleep    Historical Provider, MD   BP 132/78 mmHg  Pulse 66  Temp(Src) 98.4 F (36.9 C) (Oral)  Resp 18  Ht 5\' 9"  (1.753 m)  Wt 155 lb (70.308 kg)  BMI 22.88 kg/m2  SpO2 96% Physical Exam Physical Exam  Nursing note and vitals reviewed. Constitutional: Well developed, well nourished, non-toxic, and in no acute distress Head: Normocephalic and atraumatic.  Mouth/Throat: Oropharynx is clear and moist.  Neck: Normal range of motion. Neck supple.  no cervical spine  tenderness. Cardiovascular: Normal rate and regular rhythm.   +2 DP pulses bilaterally. Pulmonary/Chest: Effort normal and breath sounds normal.  Abdominal: Soft. There is no tenderness. There is no rebound and no guarding.  Musculoskeletal: No deformities of the right lower extremity. Pain of right hip with log rolling. Unable to ROM the right hip. Neurological: Alert, no facial droop, fluent speech, full strength bilateral ankle dorsi and plantar flexion. Sensation to light touch intact throughout bilateral lower extremities Skin: Skin is warm and dry.  small abrasion noted to the right knee Psychiatric: Cooperative  ED Course  Procedures (including critical care time) Labs Review Labs Reviewed  CBC WITH DIFFERENTIAL/PLATELET - Abnormal; Notable for the following:    RBC 3.89 (*)    Hemoglobin 12.2 (*)    HCT 35.9 (*)    All other components within normal limits  BASIC METABOLIC PANEL - Abnormal; Notable for the following:    Glucose, Bld 179 (*)    BUN 32 (*)    Creatinine, Ser 1.58 (*)    Calcium 8.4 (*)    GFR calc non Af Amer 37 (*)    GFR calc Af Amer 43 (*)    All other components within normal limits  PROTIME-INR - Abnormal; Notable for the following:    Prothrombin Time 21.8 (*)    INR 1.91 (*)    All other components within normal limits    Imaging Review Ct Hip Right Wo Contrast  03/16/2016  CLINICAL DATA:  Fall two times 3 days prior with persistent right hip pain. EXAM: CT OF THE RIGHT HIP WITHOUT CONTRAST TECHNIQUE: Multidetector CT imaging of the right hip was performed according to the standard protocol. Multiplanar CT image reconstructions were also generated. COMPARISON:  Radiographs earlier this day. FINDINGS: The question cortical irregularity at the right superior pubic ramus is most consistent with calcified enthesopathy, no acute rami fracture is seen. Right hip well seated in the acetabulum, no evidence of acute fracture, bony under mineralization limits  assessment. Mild-to-moderate degenerative change of the right hip. No confluent soft tissue hematoma. IMPRESSION: No CT findings of acute fracture of the right pelvis or hip. The questioned superior pubic ramus fracture on radiograph is felt to be related to enthesopathy. If there is high clinical suspicion for occult hip fracture or the patient refuses to weightbear, consider further evaluation with MRI. Electronically Signed   By: Jeb Levering M.D.   On: 03/16/2016 23:57   Dg Hip Unilat  With Pelvis 2-3 Views Right  03/16/2016  CLINICAL DATA:  Fall twice on Thursday (3 days prior) landing on right hip with right hip pain. EXAM: DG HIP (WITH OR WITHOUT PELVIS) 2-3V RIGHT COMPARISON:  None. FINDINGS: The bones are under mineralized. Questionable nondisplaced fracture right superior pubic ramus. No additional fracture. Age related degenerative change  of both hips. Both femoral heads are seated in the respective acetabula. IMPRESSION: Questionable nondisplaced right superior pubic ramus fracture. Electronically Signed   By: Jeb Levering M.D.   On: 03/16/2016 20:21   I have personally reviewed and evaluated these images and lab results as part of my medical decision-making.   EKG Interpretation None      MDM   Final diagnoses:  Hip pain  Right hip pain    82 year old male who presents with right hip pain after mechanical fall 4 days ago. On Coumadin, but no head injury by history and on exam. He is neurovascularly intact in the right lower extremity. With inability to range of motion the right hip and significant pain with log rolling. No other injuries noted on exam. X-ray of the hip and pelvis suggestive of questionable nondisplaced right superior pubic ramus fracture. Subsequent CT performed of the right hip and pelvis. This is visualized and without evidence of acute fracture of the right hip or pelvis per radiology. Patient having significant pain and unable to ambulate. Will require  admission for potential MRI and PT/PMR evaluation. Discussed with Dr. Arnoldo Morale will admit to [facility].   Forde Dandy, MD 03/17/16 [phone]

## 2016-03-17 DIAGNOSIS — Z7901 Long term (current) use of anticoagulants: Secondary | ICD-10-CM

## 2016-03-17 DIAGNOSIS — I480 Paroxysmal atrial fibrillation: Secondary | ICD-10-CM | POA: Diagnosis not present

## 2016-03-17 DIAGNOSIS — M25551 Pain in right hip: Secondary | ICD-10-CM | POA: Diagnosis not present

## 2016-03-17 DIAGNOSIS — I10 Essential (primary) hypertension: Secondary | ICD-10-CM | POA: Diagnosis not present

## 2016-03-17 DIAGNOSIS — E785 Hyperlipidemia, unspecified: Secondary | ICD-10-CM | POA: Diagnosis not present

## 2016-03-17 DIAGNOSIS — Z5181 Encounter for therapeutic drug level monitoring: Secondary | ICD-10-CM

## 2016-03-17 LAB — CBC
HEMATOCRIT: 35.1 % — AB (ref 39.0–52.0)
HEMOGLOBIN: 11.8 g/dL — AB (ref 13.0–17.0)
MCH: 31.6 pg (ref 26.0–34.0)
MCHC: 33.6 g/dL (ref 30.0–36.0)
MCV: 94.1 fL (ref 78.0–100.0)
Platelets: 175 10*3/uL (ref 150–400)
RBC: 3.73 MIL/uL — AB (ref 4.22–5.81)
RDW: 13.8 % (ref 11.5–15.5)
WBC: 6.4 10*3/uL (ref 4.0–10.5)

## 2016-03-17 LAB — PROTIME-INR
INR: 1.85 — ABNORMAL HIGH (ref 0.00–1.49)
PROTHROMBIN TIME: 21.3 s — AB (ref 11.6–15.2)

## 2016-03-17 LAB — BASIC METABOLIC PANEL
ANION GAP: 8 (ref 5–15)
BUN: 31 mg/dL — ABNORMAL HIGH (ref 6–20)
CALCIUM: 8.4 mg/dL — AB (ref 8.9–10.3)
CO2: 26 mmol/L (ref 22–32)
Chloride: 106 mmol/L (ref 101–111)
Creatinine, Ser: 1.48 mg/dL — ABNORMAL HIGH (ref 0.61–1.24)
GFR, EST AFRICAN AMERICAN: 47 mL/min — AB (ref >60)
GFR, EST NON AFRICAN AMERICAN: 40 mL/min — AB (ref >60)
Glucose, Bld: 201 mg/dL — ABNORMAL HIGH (ref 65–99)
Potassium: 3.8 mmol/L (ref 3.5–5.1)
Sodium: 140 mmol/L (ref 135–145)

## 2016-03-17 MED ORDER — HYDROCODONE-ACETAMINOPHEN 5-325 MG PO TABS
0.5000 | ORAL_TABLET | Freq: Once | ORAL | Status: AC
  Administered 2016-03-17: 0.5 via ORAL
  Filled 2016-03-17: qty 1

## 2016-03-17 MED ORDER — ONDANSETRON HCL 4 MG/2ML IJ SOLN
4.0000 mg | Freq: Four times a day (QID) | INTRAMUSCULAR | Status: DC | PRN
Start: 2016-03-17 — End: 2016-03-18

## 2016-03-17 MED ORDER — ADULT MULTIVITAMIN W/MINERALS CH
1.0000 | ORAL_TABLET | Freq: Every day | ORAL | Status: DC
  Administered 2016-03-17 – 2016-03-18 (×2): 1 via ORAL
  Filled 2016-03-17 (×2): qty 1

## 2016-03-17 MED ORDER — TERBINAFINE HCL 250 MG PO TABS
250.0000 mg | ORAL_TABLET | Freq: Every day | ORAL | Status: DC
  Administered 2016-03-17 – 2016-03-18 (×2): 250 mg via ORAL
  Filled 2016-03-17 (×2): qty 1

## 2016-03-17 MED ORDER — BENZONATATE 100 MG PO CAPS
100.0000 mg | ORAL_CAPSULE | Freq: Three times a day (TID) | ORAL | Status: DC | PRN
  Administered 2016-03-17: 100 mg via ORAL
  Filled 2016-03-17: qty 1

## 2016-03-17 MED ORDER — HYDROMORPHONE HCL 1 MG/ML IJ SOLN: 0.5000 mg | INTRAMUSCULAR | Status: DC | PRN

## 2016-03-17 MED ORDER — WARFARIN SODIUM 7.5 MG PO TABS
7.5000 mg | ORAL_TABLET | Freq: Once | ORAL | Status: AC
  Administered 2016-03-17: 7.5 mg via ORAL
  Filled 2016-03-17: qty 1

## 2016-03-17 MED ORDER — LEVOTHYROXINE SODIUM 25 MCG PO TABS
25.0000 ug | ORAL_TABLET | Freq: Every day | ORAL | Status: DC
  Administered 2016-03-17 – 2016-03-18 (×2): 25 ug via ORAL
  Filled 2016-03-17 (×3): qty 1

## 2016-03-17 MED ORDER — ZOLPIDEM TARTRATE 5 MG PO TABS
5.0000 mg | ORAL_TABLET | Freq: Every evening | ORAL | Status: DC | PRN
  Administered 2016-03-18: 2.5 mg via ORAL
  Filled 2016-03-17 (×2): qty 1

## 2016-03-17 MED ORDER — ACETAMINOPHEN 650 MG RE SUPP: 650.0000 mg | Freq: Four times a day (QID) | RECTAL | Status: DC | PRN

## 2016-03-17 MED ORDER — ONDANSETRON HCL 4 MG PO TABS: 4.0000 mg | ORAL_TABLET | Freq: Four times a day (QID) | ORAL | Status: DC | PRN

## 2016-03-17 MED ORDER — DOCUSATE SODIUM 100 MG PO CAPS: 100.0000 mg | ORAL_CAPSULE | Freq: Every day | ORAL | Status: DC | PRN

## 2016-03-17 MED ORDER — AMIODARONE HCL 100 MG PO TABS
100.0000 mg | ORAL_TABLET | Freq: Every day | ORAL | Status: DC
  Administered 2016-03-17 – 2016-03-18 (×2): 100 mg via ORAL
  Filled 2016-03-17 (×2): qty 1

## 2016-03-17 MED ORDER — OXYCODONE HCL 5 MG PO TABS
5.0000 mg | ORAL_TABLET | ORAL | Status: DC | PRN
  Administered 2016-03-17 – 2016-03-18 (×2): 5 mg via ORAL
  Filled 2016-03-17 (×2): qty 1

## 2016-03-17 MED ORDER — WARFARIN SODIUM 2.5 MG PO TABS: 2.5000 mg | ORAL_TABLET | ORAL | Status: DC

## 2016-03-17 MED ORDER — PSYLLIUM 95 % PO PACK
1.0000 | PACK | Freq: Every day | ORAL | Status: DC | PRN
  Filled 2016-03-17 (×2): qty 1

## 2016-03-17 MED ORDER — SODIUM CHLORIDE 0.9 % IV SOLN
INTRAVENOUS | Status: DC
  Administered 2016-03-17: 03:00:00 via INTRAVENOUS

## 2016-03-17 MED ORDER — WARFARIN - PHARMACIST DOSING INPATIENT: Freq: Every day | Status: DC

## 2016-03-17 MED ORDER — HYDROCODONE-ACETAMINOPHEN 5-325 MG PO TABS: 1.0000 | ORAL_TABLET | ORAL | Status: DC | PRN

## 2016-03-17 MED ORDER — SIMVASTATIN 20 MG PO TABS
20.0000 mg | ORAL_TABLET | Freq: Every day | ORAL | Status: DC
  Administered 2016-03-17 (×2): 20 mg via ORAL
  Filled 2016-03-17 (×3): qty 1

## 2016-03-17 MED ORDER — ACETAMINOPHEN 325 MG PO TABS
650.0000 mg | ORAL_TABLET | Freq: Four times a day (QID) | ORAL | Status: DC | PRN
  Administered 2016-03-17 – 2016-03-18 (×2): 650 mg via ORAL
  Filled 2016-03-17 (×2): qty 2

## 2016-03-17 MED ORDER — LORAZEPAM 0.5 MG PO TABS: 0.5000 mg | ORAL_TABLET | Freq: Every day | ORAL | Status: DC | PRN

## 2016-03-17 MED ORDER — TAMSULOSIN HCL 0.4 MG PO CAPS
0.4000 mg | ORAL_CAPSULE | Freq: Every day | ORAL | Status: DC
  Administered 2016-03-17 – 2016-03-18 (×2): 0.4 mg via ORAL
  Filled 2016-03-17 (×2): qty 1

## 2016-03-17 NOTE — Evaluation (Signed)
Physical Therapy Evaluation Patient Details Name: [name] MRN: [id number] DOB: 04-15-1924 Today's Date: 03/17/2016   History of Present Illness  82 yo male admitted after sustaining fall and experiencing R hip pain and inability to ambulate. hx of SSs, pacemaker, HTN, A fib, TIA, ataxia.   Clinical Impression  On eval, pt required Min assist for bed mobility and Min guard assist for ambulation. Pt walked ~75 feet with RW with ~4/10 pain. Pt tolerated activity fairly well. He is highly motivated to mobilize. Recommend HHPT and home health aide at discharge.     Follow Up Recommendations Home health PT; Home Health Aide    Equipment Recommendations  Rolling walker with 5" wheels    Recommendations for Other Services       Precautions / Restrictions Precautions Precautions: Fall Restrictions Weight Bearing Restrictions: No      Mobility  Bed Mobility Overal bed mobility: Needs Assistance Bed Mobility: Supine to Sit     Supine to sit: HOB elevated;Min assist     General bed mobility comments: Assist for trunk to upright. Increased time.   Transfers Overall transfer level: Needs assistance Equipment used: Rolling walker (2 wheeled) Transfers: Sit to/from Stand Sit to Stand: Min guard;From elevated surface         General transfer comment: close guard for safety. VCs safety, hand placement  Ambulation/Gait Ambulation/Gait assistance: Min guard Ambulation Distance (Feet): 75 Feet Assistive device: Rolling walker (2 wheeled) Gait Pattern/deviations: Step-to pattern;Step-through pattern;Decreased step length - right;Antalgic     General Gait Details: close guard for safety. slow gait speed. Pt able to weightbear albeit with some pain. Pt tolerated distance well.   Stairs            Wheelchair Mobility    Modified Rankin (Stroke Patients Only)       Balance Overall balance assessment: Needs assistance;History of Falls         Standing balance  support: Bilateral upper extremity supported;During functional activity Standing balance-Leahy Scale: Poor Standing balance comment: requires RW                             Pertinent Vitals/Pain Pain Assessment: Faces Faces Pain Scale: Hurts even more Pain Location: R hip-posterior with ambulation Pain Descriptors / Indicators: Sore;Aching Pain Intervention(s): Premedicated before session;Monitored during session;Repositioned;Ice applied    Home Living Family/patient expects to be discharged to:: Private residence Living Arrangements: Spouse/significant other (unable to provide any physical assistance) Available Help at Discharge: Family (grandchildren help him up and down entry steps) Type of Home: House Home Access: Stairs to enter Entrance Stairs-Rails: None Entrance Stairs-Number of Steps: 1 Home Layout: One level Home Equipment: Environmental consultant - 2 wheels;Cane - quad      Prior Function Level of Independence: Independent with assistive device(s)         Comments: uses RW     Hand Dominance        Extremity/Trunk Assessment   Upper Extremity Assessment: Overall WFL for tasks assessed           Lower Extremity Assessment: Generalized weakness      Cervical / Trunk Assessment: Normal  Communication   Communication: HOH  Cognition Arousal/Alertness: Awake/alert Behavior During Therapy: WFL for tasks assessed/performed Overall Cognitive Status: Within Functional Limits for tasks assessed                      General Comments  Exercises        Assessment/Plan    PT Assessment Patient needs continued PT services  PT Diagnosis Difficulty walking;Acute pain;Generalized weakness   PT Problem List Decreased strength;Decreased range of motion;Decreased activity tolerance;Decreased balance;Decreased mobility;Decreased knowledge of use of DME;Pain  PT Treatment Interventions DME instruction;Gait training;Functional mobility  training;Patient/family education;Therapeutic activities;Balance training;Therapeutic exercise   PT Goals (Current goals can be found in the Care Plan section) Acute Rehab PT Goals Patient Stated Goal: to regain independence.  PT Goal Formulation: With patient Time For Goal Achievement: 03/24/16 Potential to Achieve Goals: Good    Frequency Min 3X/week   Barriers to discharge        Co-evaluation               End of Session Equipment Utilized During Treatment: Gait belt Activity Tolerance: Patient tolerated treatment well Patient left: in chair;with call bell/phone within reach;with chair alarm set           Time: [id number] PT Time Calculation (min) (ACUTE ONLY): 22 min   Charges:   PT Evaluation $PT Eval Low Complexity: 1 Procedure     PT G Codes:        Weston Anna, MPT Pager: [phone]

## 2016-03-17 NOTE — Progress Notes (Signed)
ANTICOAGULATION CONSULT NOTE - Initial Consult  Pharmacy Consult for warfarin Indication: atrial fibrillation  Allergies  Allergen Reactions  . Guaifenesin Er Palpitations    Pt can not take mucinex PM  . Prednisone Other (See Comments)    HIGH doses make patient feel "crazy"    Patient Measurements: Height: 5\' 9"  (175.3 cm) Weight: 157 lb 6.5 oz (71.4 kg) IBW/kg (Calculated) : 70.7 Heparin Dosing Weight:   Vital Signs: Temp: 99 F (37.2 C) (04/17 0143) Temp Source: Oral (04/17 0143) BP: 165/66 mmHg (04/17 0143) Pulse Rate: 60 (04/17 0143)  Labs:  Recent Labs  03/16/16 2049  HGB 12.2*  HCT 35.9*  PLT 175  LABPROT 21.8*  INR 1.91*  CREATININE 1.58*    Estimated Creatinine Clearance: 31.7 mL/min (by C-G formula based on Cr of 1.58).   Medical History: Past Medical History  Diagnosis Date  . Atrial fibrillation (HCC)     coumadin Rx  . Sick sinus syndrome (HCC)     s/p PPM  . Ataxia     followed by Dr. Erling Cruz  . Hyperlipidemia   . Hypothyroidism   . BPH (benign prostatic hyperplasia)   . Hypertension   . TIA (transient ischemic attack)     carotid dopplers 5/11: 0-39% bilateral; follow up due 03/2012  . Peripheral neuropathy (HCC)     Medications:  Prescriptions prior to admission  Medication Sig Dispense Refill Last Dose  . acetaminophen (TYLENOL) 500 MG tablet Take 500 mg by mouth every 6 (six) hours as needed for moderate pain.   03/16/2016 at Unknown time  . amiodarone (PACERONE) 200 MG tablet Take 100 mg by mouth daily.   03/16/2016 at 1130  . docusate sodium (COLACE) 100 MG capsule Take 100 mg by mouth daily as needed for mild constipation.    Past Month at Unknown time  . levothyroxine (SYNTHROID, LEVOTHROID) 25 MCG tablet Take 25 mcg by mouth daily before breakfast.   03/16/2016 at Unknown time  . Multiple Vitamin (MULTIVITAMIN) tablet Take 1 tablet by mouth daily.     Past Week at Unknown time  . simvastatin (ZOCOR) 20 MG tablet Take 20 mg by mouth  at bedtime.     03/15/2016 at Unknown time  . terbinafine (LAMISIL) 250 MG tablet Take 250 mg by mouth daily.   03/16/2016 at Unknown time  . warfarin (COUMADIN) 5 MG tablet Take 1 tablet (5 mg total) by mouth as directed. (Patient taking differently: Take 2.5-5 mg by mouth as directed. Take 2.5 mg (0.5 tablet) on Tues, Thurs, Sat and Sun and Take 5 mg (1 tablet) on Mon, Wed and Fri) 90 tablet 1 03/16/2016 at 1800  . LORazepam (ATIVAN) 1 MG tablet Take 0.5 mg by mouth daily as needed. Irregular heartbeat/ anxiety   unknown  . Nutritional Supplements (FIBER FORMULA) POWD Take 1 packet by mouth daily as needed (bowels).    unknown  . zolpidem (AMBIEN) 10 MG tablet Take 5 mg by mouth at bedtime as needed for sleep. sleep   unknown   Scheduled:  . amiodarone  100 mg Oral Daily  . levothyroxine  25 mcg Oral QAC breakfast  . multivitamin with minerals  1 tablet Oral Daily  . simvastatin  20 mg Oral QHS  . terbinafine  250 mg Oral Daily  . warfarin  7.5 mg Oral ONCE-1800  . Warfarin - Pharmacist Dosing Inpatient   Does not apply q1800    Assessment: Patient with INR < 2 on admit.  Last dose  of warfarin 4/16 PM.  Goal of Therapy:  INR 2-3    Plan:  Start with Coumadin 7.5 mg tonight at 1800. Check PT/INR daily.    Tyler Deis, Shea Stakes Crowford 03/17/2016,1:48 AM

## 2016-03-17 NOTE — Progress Notes (Signed)
When patient was admitted to the floor, patient told this RN that he had not urinated since 8:30 am. Bladder scanned the patient and it showed 530cc. NP was notified and new orders were given for a one time in and out cath.

## 2016-03-17 NOTE — Progress Notes (Signed)
Patient Demographics  [name], is a 82 y.o. male, DOB - 03-15-1924, [id number]  Admit date - 03/16/2016   Admitting Physician Theressa Millard, MD  Outpatient Primary MD for the patient is Merrilee Seashore, MD  LOS - 0   Chief Complaint  Patient presents with  . Fall       Admission HPI/Brief narrative: 82 y.o. male with a history of Atrial fibrillation on Coumadin Rx, SSS S/P PAcemaker, HTN, and Hypothyroid who presents to the ED with complaints of severe Right Hip pain and difficulty walking since 3 mechanical falls 4 days ago.CT scan of the Pelvis was performed and was negative for fracture. He was referred for pain control and Physical Therapy.   Subjective:   [name] today has, No headache, No chest pain, No abdominal pain - reports hip pain is acceptable . Assessment & Plan    Principal Problem:   Right hip pain Active Problems:   Hypothyroidism   Hyperlipidemia   HYPERTENSION, BENIGN ESSENTIAL   ATRIAL FIBRILLATION   Subtherapeutic anticoagulation  Right hip pain status post multiple falls - CT right pelvis with no evidence of fracture - PT consult - PRN pain medication  Urinary retention - Ambulate, In and out cath as needed needed, will start on Flomax  AKI - Continue with gentle hydration, check BMP in a.m.  Atrial fibrillation - Heart rate controlled on amiodarone - Warfarin, pharmacy to dose  Hypothyroidism - Continue with Synthroid  Hyperlipidemia - Continue with statin  Code Status: Full  Family Communication: none at bedside  Disposition Plan: home with home PT   Procedures  none   Consults   none   Medications  Scheduled Meds: . amiodarone  100 mg Oral Daily  . levothyroxine  25 mcg Oral QAC breakfast  . multivitamin with minerals  1 tablet Oral Daily  . simvastatin  20 mg Oral QHS  . tamsulosin  0.4 mg Oral Daily    . terbinafine  250 mg Oral Daily  . warfarin  7.5 mg Oral ONCE-1800  . Warfarin - Pharmacist Dosing Inpatient   Does not apply q1800   Continuous Infusions: . sodium chloride 50 mL/hr at 03/17/16 0241   PRN Meds:.acetaminophen **OR** acetaminophen, benzonatate, docusate sodium, HYDROmorphone (DILAUDID) injection, LORazepam, ondansetron **OR** ondansetron (ZOFRAN) IV, oxyCODONE, psyllium, zolpidem  DVT Prophylaxis Warfarin  Lab Results  Component Value Date   PLT 175 03/17/2016    Antibiotics    Anti-infectives    Start     Dose/Rate Route Frequency Ordered Stop   03/17/16 1000  terbinafine (LAMISIL) tablet 250 mg     250 mg Oral Daily 03/17/16 0142            Objective:   Filed Vitals:   03/17/16 0143 03/17/16 0456 03/17/16 0900 03/17/16 1400  BP: 165/66 132/65 156/58 177/80  Pulse: 60 60 64 59  Temp: 99 F (37.2 C) 97.6 F (36.4 C) 98.2 F (36.8 C) 98 F (36.7 C)  TempSrc: Oral Oral Oral Oral  Resp: 20 18 18 16   Height: 5\' 9"  (1.753 m)     Weight: 71.4 kg (157 lb 6.5 oz)     SpO2: 95% 96% 97% 100%    Wt Readings from Last 3 Encounters:  03/17/16 71.4 kg (157 lb 6.5 oz)  11/07/15 72.176 kg (159 lb 1.9 oz)  11/06/14 72.122 kg (159 lb)     Intake/Output Summary (Last 24 hours) at 03/17/16 1549 Last data filed at 03/17/16 1522  Gross per 24 hour  Intake    240 ml  Output   1251 ml  Net  -1011 ml     Physical Exam  Awake Alert, Oriented X 3,  [place].AT,PERRAL Supple Neck,No JVD, No cervical lymphadenopathy appriciated.  Symmetrical Chest wall movement, Good air movement bilaterally, CTAB No Gallops,Rubs or new Murmurs, No Parasternal Heave +ve B.Sounds, Abd Soft, No tenderness, No organomegaly appriciated, No rebound - guarding or rigidity. No Cyanosis, Clubbing or edema, No new Rash or bruise     Data Review   Micro Results No results found for this or any previous visit (from the past 240 hour(s)).  Radiology Reports Ct Hip Right Wo  Contrast  03/16/2016  CLINICAL DATA:  Fall two times 3 days prior with persistent right hip pain. EXAM: CT OF THE RIGHT HIP WITHOUT CONTRAST TECHNIQUE: Multidetector CT imaging of the right hip was performed according to the standard protocol. Multiplanar CT image reconstructions were also generated. COMPARISON:  Radiographs earlier this day. FINDINGS: The question cortical irregularity at the right superior pubic ramus is most consistent with calcified enthesopathy, no acute rami fracture is seen. Right hip well seated in the acetabulum, no evidence of acute fracture, bony under mineralization limits assessment. Mild-to-moderate degenerative change of the right hip. No confluent soft tissue hematoma. IMPRESSION: No CT findings of acute fracture of the right pelvis or hip. The questioned superior pubic ramus fracture on radiograph is felt to be related to enthesopathy. If there is high clinical suspicion for occult hip fracture or the patient refuses to weightbear, consider further evaluation with MRI. Electronically Signed   By: Jeb Levering M.D.   On: 03/16/2016 23:57   Dg Hip Unilat  With Pelvis 2-3 Views Right  03/16/2016  CLINICAL DATA:  Fall twice on Thursday (3 days prior) landing on right hip with right hip pain. EXAM: DG HIP (WITH OR WITHOUT PELVIS) 2-3V RIGHT COMPARISON:  None. FINDINGS: The bones are under mineralized. Questionable nondisplaced fracture right superior pubic ramus. No additional fracture. Age related degenerative change of both hips. Both femoral heads are seated in the respective acetabula. IMPRESSION: Questionable nondisplaced right superior pubic ramus fracture. Electronically Signed   By: Jeb Levering M.D.   On: 03/16/2016 20:21     CBC  Recent Labs Lab 03/16/16 2049 03/17/16 0355  WBC 8.3 6.4  HGB 12.2* 11.8*  HCT 35.9* 35.1*  PLT 175 175  MCV 92.3 94.1  MCH 31.4 31.6  MCHC 34.0 33.6  RDW 13.6 13.8  LYMPHSABS 1.2  --   MONOABS 0.6  --   EOSABS 0.1  --    BASOSABS 0.0  --     Chemistries   Recent Labs Lab 03/16/16 2049 03/17/16 0355  NA 140 140  K 4.1 3.8  CL 105 106  CO2 26 26  GLUCOSE 179* 201*  BUN 32* 31*  CREATININE 1.58* 1.48*  CALCIUM 8.4* 8.4*   ------------------------------------------------------------------------------------------------------------------ estimated creatinine clearance is 33.8 mL/min (by C-G formula based on Cr of 1.48). ------------------------------------------------------------------------------------------------------------------ No results for input(s): HGBA1C in the last 72 hours. ------------------------------------------------------------------------------------------------------------------ No results for input(s): CHOL, HDL, LDLCALC, TRIG, CHOLHDL, LDLDIRECT in the last 72 hours. ------------------------------------------------------------------------------------------------------------------ No results for input(s): TSH, T4TOTAL, T3FREE, THYROIDAB in the last 72 hours.  Invalid  input(s): FREET3 ------------------------------------------------------------------------------------------------------------------ No results for input(s): VITAMINB12, FOLATE, FERRITIN, TIBC, IRON, RETICCTPCT in the last 72 hours.  Coagulation profile  Recent Labs Lab 03/11/16 1453 03/16/16 2049 03/17/16 0355  INR 2.0 1.91* 1.85*    No results for input(s): DDIMER in the last 72 hours.  Cardiac Enzymes No results for input(s): CKMB, TROPONINI, MYOGLOBIN in the last 168 hours.  Invalid input(s): CK ------------------------------------------------------------------------------------------------------------------ Invalid input(s): POCBNP     Time Spent in minutes   No Sherlyn Lees, Nyima Vanacker M.D on 03/17/2016 at 3:49 PM  Between 7am to 7pm - Pager - [phone]  After 7pm go to [email or web address] - password [facility]  [facility]   Office  [phone]

## 2016-03-17 NOTE — Care Management Note (Signed)
Case Management Note  Patient Details  Name: [name] MRN: [id number] Date of Birth: 02/27/1924  Subjective/Objective:             82 yo admitted with right hip pain       Action/Plan: From home with wife.  Expected Discharge Date:                  Expected Discharge Plan:  [name]  In-House Referral:     Discharge planning Services  CM Consult  Post Acute Care Choice:  Home Health Choice offered to:     DME Arranged:  3-N-1, [place] rolling DME Agency:  [place]:    [facility] Agency:     Status of Service:  In process, will continue to follow  Medicare Important Message Given:    Date Medicare IM Given:    Medicare IM give by:    Date Additional Medicare IM Given:    Additional Medicare Important Message give by:     If discussed at [place] of Stay Meetings, dates discussed:    Additional Comments: This CM met with pt at bedside and discussed PT recommendations.  Pt states he would like to discuss home health agencies with his wife in the morning before he chooses one.  [facility] provider list left with pt.  Pt states he needs RW and 3 in 1 for home.  Orders placed and [facility] DME rep contacted for DME referral.  Will need orders for HHPT/Aide at discharge.  CM will continue to follow. Lynnell Catalan, RN 03/17/2016, 4:28 PM [phone]

## 2016-03-17 NOTE — H&P (Signed)
[facility] Admission History and Physical       [name] [id number] DOB: Nov 27, 1924 DOA: 03/16/2016  Referring physician: EDP PCP: Merrilee Seashore, MD  Specialists:   Chief Complaint: Right Hip Pain  HPI: [name] is a 82 y.o. male with a history of Atrial fibrillation on Coumadin Rx, SSS S/P PAcemaker, HTN, and Hypothyroid who presents to the ED with complaints of severe Right Hip pain and difficulty walking since 3 mechanical falls 4 days ago.   He was evaluated in the ED and X-Rays were performed of the Pelvis which suggested a possible fracture of the pelvis, so a CT scan of the Pelvis was performed and was negative for fracture.   He was referred for pain control and Physical Therapy.      Review of Systems:    Constitutional: No Weight Loss, No Weight Gain, Night Sweats, Fevers, Chills, Dizziness, Light Headedness, Fatigue, or Generalized Weakness HEENT: No Headaches, Difficulty Swallowing,Tooth/Dental Problems,Sore Throat,  No Sneezing, Rhinitis, Ear Ache, Nasal Congestion, or Post Nasal Drip,  Cardio-vascular:  No Chest pain, Orthopnea, PND, Edema in Lower Extremities, Anasarca, Dizziness, Palpitations  Resp: No Dyspnea, No DOE, No Productive Cough, No Non-Productive Cough, No Hemoptysis, No Wheezing.    GI: No Heartburn, Indigestion, Abdominal Pain, Nausea, Vomiting, Diarrhea, Constipation, Hematemesis, Hematochezia, Melena, Change in Bowel Habits,  Loss of Appetite  GU: No Dysuria, No Change in Color of Urine, No Urgency or Urinary Frequency, No Flank pain.  Musculoskeletal: +Right Hip Pain, No Decreased Range of Motion, No Back Pain.  Neurologic: No Syncope, No Seizures, Muscle Weakness, Paresthesia, Vision Disturbance or Loss, No Diplopia, No Vertigo, +Difficulty Walking,  Skin: No Rash or Lesions. Psych: No Change in Mood or Affect, No Depression or Anxiety, No Memory loss, No Confusion, or Hallucinations   Past Medical History  Diagnosis Date    . Atrial fibrillation (HCC)     coumadin Rx  . Sick sinus syndrome (HCC)     s/p PPM  . Ataxia     followed by Dr. Erling Cruz  . Hyperlipidemia   . Hypothyroidism   . BPH (benign prostatic hyperplasia)   . Hypertension   . TIA (transient ischemic attack)     carotid dopplers 5/11: 0-39% bilateral; follow up due 03/2012  . Peripheral neuropathy [facility])      Past Surgical History  Procedure Laterality Date  . Pacemaker insertion      implanted 10/96, most recent Gen Change by Dr Olevia Perches 8/08      Prior to Admission medications   Medication Sig Start Date End Date Taking? Authorizing Provider  acetaminophen (TYLENOL) 500 MG tablet Take 500 mg by mouth every 6 (six) hours as needed for moderate pain.   Yes Historical Provider, MD  amiodarone (PACERONE) 200 MG tablet Take 100 mg by mouth daily.   Yes Historical Provider, MD  docusate sodium (COLACE) 100 MG capsule Take 100 mg by mouth daily as needed for mild constipation.    Yes Historical Provider, MD  levothyroxine (SYNTHROID, LEVOTHROID) 25 MCG tablet Take 25 mcg by mouth daily before breakfast.   Yes Historical Provider, MD  Multiple Vitamin (MULTIVITAMIN) tablet Take 1 tablet by mouth daily.     Yes Historical Provider, MD  simvastatin (ZOCOR) 20 MG tablet Take 20 mg by mouth at bedtime.     Yes Historical Provider, MD  terbinafine (LAMISIL) 250 MG tablet Take 250 mg by mouth daily.   Yes Historical Provider, MD  warfarin (COUMADIN) 5 MG tablet Take 1  tablet (5 mg total) by mouth as directed. Patient taking differently: Take 2.5-5 mg by mouth as directed. Take 2.5 mg (0.5 tablet) on Tues, Thurs, Sat and Sun and Take 5 mg (1 tablet) on Mon, Wed and Fri 10/29/15  Yes Thompson Grayer, MD  LORazepam (ATIVAN) 1 MG tablet Take 0.5 mg by mouth daily as needed. Irregular heartbeat/ anxiety    Historical Provider, MD  Nutritional Supplements (FIBER FORMULA) POWD Take 1 packet by mouth daily as needed (bowels).     Historical Provider, MD  zolpidem  (AMBIEN) 10 MG tablet Take 5 mg by mouth at bedtime as needed for sleep. sleep    Historical Provider, MD     Allergies  Allergen Reactions  . Guaifenesin Er Palpitations    Pt can not take mucinex PM  . Prednisone Other (See Comments)    HIGH doses make patient feel "crazy"    Social History:  reports that he quit smoking about 38 years ago. He has never used smokeless tobacco. He reports that he does not drink alcohol or use illicit drugs.    Family History  Problem Relation Age of Onset  . Stroke Mother        Physical Exam:  GEN:  Pleasant Elderly Well Developed  82 y.o. Caucasian male examined and in no acute distress; cooperative with exam Filed Vitals:   03/16/16 1937 03/16/16 1941 03/16/16 2204  BP: 112/66  132/78  Pulse: 88  66  Temp: 98.4 F (36.9 C)    TempSrc: Oral    Resp: 20  18  Height:  5\' 9"  (1.753 m)   Weight:  70.308 kg (155 lb)   SpO2: 94%  96%   Blood pressure 132/78, pulse 66, temperature 98.4 F (36.9 C), temperature source Oral, resp. rate 18, height 5\' 9"  (1.753 m), weight 70.308 kg (155 lb), SpO2 96 %. PSYCH: He is alert and oriented x4; does not appear anxious does not appear depressed; affect is normal HEENT: Normocephalic and Atraumatic, Mucous membranes pink; PERRLA; EOM intact; Fundi:  Benign;  No scleral icterus, Nares: Patent, Oropharynx: Clear, Fair Dentition,    Neck:  FROM, No Cervical Lymphadenopathy nor Thyromegaly or Carotid Bruit; No JVD; Breasts:: Not examined CHEST WALL: No tenderness CHEST: Normal respiration, clear to auscultation bilaterally HEART: Regular rate and rhythm; no murmurs rubs or gallops BACK: No kyphosis or scoliosis; No CVA tenderness ABDOMEN: Positive Bowel Sounds, Soft Non-Tender, No Rebound or Guarding; No Masses, No Organomegaly. Rectal Exam: Not done EXTREMITIES: No  Cyanosis, Clubbing, or Edema; No Ulcerations. Genitalia: not examined PULSES: 2+ and symmetric SKIN: Normal hydration no rash or  ulceration CNS:  Alert and Oriented x 4, No Focal Deficits Vascular: pulses palpable throughout    Labs on Admission:  Basic Metabolic Panel:  Recent Labs Lab 03/16/16 2049  NA 140  K 4.1  CL 105  CO2 26  GLUCOSE 179*  BUN 32*  CREATININE 1.58*  CALCIUM 8.4*   Liver Function Tests: No results for input(s): AST, ALT, ALKPHOS, BILITOT, PROT, ALBUMIN in the last 168 hours. No results for input(s): LIPASE, AMYLASE in the last 168 hours. No results for input(s): AMMONIA in the last 168 hours. CBC:  Recent Labs Lab 03/16/16 2049  WBC 8.3  NEUTROABS 6.5  HGB 12.2*  HCT 35.9*  MCV 92.3  PLT 175   Cardiac Enzymes: No results for input(s): CKTOTAL, CKMB, CKMBINDEX, TROPONINI in the last 168 hours.  BNP (last 3 results) No results for input(s): BNP in  the last 8760 hours.  ProBNP (last 3 results) No results for input(s): PROBNP in the last 8760 hours.  CBG: No results for input(s): GLUCAP in the last 168 hours.  Radiological Exams on Admission: Ct Hip Right Wo Contrast  03/16/2016  CLINICAL DATA:  Fall two times 3 days prior with persistent right hip pain. EXAM: CT OF THE RIGHT HIP WITHOUT CONTRAST TECHNIQUE: Multidetector CT imaging of the right hip was performed according to the standard protocol. Multiplanar CT image reconstructions were also generated. COMPARISON:  Radiographs earlier this day. FINDINGS: The question cortical irregularity at the right superior pubic ramus is most consistent with calcified enthesopathy, no acute rami fracture is seen. Right hip well seated in the acetabulum, no evidence of acute fracture, bony under mineralization limits assessment. Mild-to-moderate degenerative change of the right hip. No confluent soft tissue hematoma. IMPRESSION: No CT findings of acute fracture of the right pelvis or hip. The questioned superior pubic ramus fracture on radiograph is felt to be related to enthesopathy. If there is high clinical suspicion for occult hip  fracture or the patient refuses to weightbear, consider further evaluation with MRI. Electronically Signed   By: Jeb Levering M.D.   On: 03/16/2016 23:57   Dg Hip Unilat  With Pelvis 2-3 Views Right  03/16/2016  CLINICAL DATA:  Fall twice on Thursday (3 days prior) landing on right hip with right hip pain. EXAM: DG HIP (WITH OR WITHOUT PELVIS) 2-3V RIGHT COMPARISON:  None. FINDINGS: The bones are under mineralized. Questionable nondisplaced fracture right superior pubic ramus. No additional fracture. Age related degenerative change of both hips. Both femoral heads are seated in the respective acetabula. IMPRESSION: Questionable nondisplaced right superior pubic ramus fracture. Electronically Signed   By: Jeb Levering M.D.   On: 03/16/2016 20:21        Assessment/Plan:     82 y.o. male with  Principal Problem:    Right hip pain   Pain Control PRN   Physical Therapy consult       Active Problems:    ATRIAL FIBRILLATION   On Amiodarone Rx   On Coumadin Rx      Subtherapeutic anticoagulation   Adjust Coumadin Rx   Monitor Pt/INR daily      Hypothyroidism   Continue Levothyroxine Rx      Hyperlipidemia   Continue Atorvastatin Rx      HYPERTENSION, BENIGN ESSENTIAL   Monitor BPs    DVT Prophylaxis   On Coumadin Rx    Code Status:     FULL CODE        Family Communication:   Wife and Daughter at Bedside   Disposition Plan:    Inpatient Status        Time spent:  68 Minutes      [facility] Pager [phone]   If 7AM -7PM Please Contact the Day Rounding Team MD for [facility]  If 7PM-7AM, Please Contact Night-Floor Coverage  [email or web address] Password [email or web address] 03/17/2016, 12:43 AM     ADDENDUM:   Patient was seen and examined on 03/17/2016

## 2016-03-17 NOTE — Care Management Obs Status (Signed)
[place] NOTIFICATION   Patient Details  Name: [name] MRN: [id number] Date of Birth: 11/27/1924   Medicare Observation Status Notification Given:  Yes    Lynnell Catalan, RN 03/17/2016, 4:24 PM

## 2016-03-17 NOTE — ED Notes (Signed)
Informed by Dr.  Oleta Mouse, no need to try to ambulate pt.

## 2016-03-18 DIAGNOSIS — I480 Paroxysmal atrial fibrillation: Secondary | ICD-10-CM | POA: Diagnosis not present

## 2016-03-18 DIAGNOSIS — M25551 Pain in right hip: Secondary | ICD-10-CM | POA: Diagnosis not present

## 2016-03-18 DIAGNOSIS — R339 Retention of urine, unspecified: Secondary | ICD-10-CM

## 2016-03-18 DIAGNOSIS — E039 Hypothyroidism, unspecified: Secondary | ICD-10-CM

## 2016-03-18 DIAGNOSIS — E785 Hyperlipidemia, unspecified: Secondary | ICD-10-CM | POA: Diagnosis not present

## 2016-03-18 LAB — PROTIME-INR
INR: 2.16 — AB (ref 0.00–1.49)
Prothrombin Time: 23.9 seconds — ABNORMAL HIGH (ref 11.6–15.2)

## 2016-03-18 LAB — BASIC METABOLIC PANEL
ANION GAP: 10 (ref 5–15)
BUN: 33 mg/dL — AB (ref 6–20)
CHLORIDE: 104 mmol/L (ref 101–111)
CO2: 27 mmol/L (ref 22–32)
Calcium: 8.6 mg/dL — ABNORMAL LOW (ref 8.9–10.3)
Creatinine, Ser: 1.14 mg/dL (ref 0.61–1.24)
GFR calc Af Amer: >60 mL/min (ref >60)
GFR, EST NON AFRICAN AMERICAN: 55 mL/min — AB (ref >60)
GLUCOSE: 133 mg/dL — AB (ref 65–99)
Potassium: 4.9 mmol/L (ref 3.5–5.1)
SODIUM: 141 mmol/L (ref 135–145)

## 2016-03-18 LAB — URINE CULTURE: CULTURE: NO GROWTH

## 2016-03-18 MED ORDER — TAMSULOSIN HCL 0.4 MG PO CAPS: 0.4000 mg | ORAL_CAPSULE | Freq: Every day | ORAL | Status: DC

## 2016-03-18 MED ORDER — HYDROCODONE-ACETAMINOPHEN 5-325 MG PO TABS: 1.0000 | ORAL_TABLET | Freq: Four times a day (QID) | ORAL | Status: DC | PRN

## 2016-03-18 MED ORDER — WARFARIN SODIUM 2.5 MG PO TABS
2.5000 mg | ORAL_TABLET | Freq: Once | ORAL | Status: DC
  Filled 2016-03-18: qty 1

## 2016-03-18 NOTE — Discharge Summary (Addendum)
Mackinnon Genova, is a 82 y.o. male  DOB Aug 19, 1924  MRN [id number].  Admission date:  03/16/2016  Admitting Physician  Theressa Millard, MD  Discharge Date:  03/18/2016   Primary MD  Merrilee Seashore, MD  Recommendations for primary care physician for things to follow:  - Patient to follow with urology as an outpatient Dr. Clarene Duke regarding voiding trial, he will be called with an appointment. - Vision need to follow with his primary cardiologist Dr. Rayann Heman regarding further recommendation of safety of anticoagulation .  Admission Diagnosis  Hip pain [M25.559] Right hip pain [M25.551]   Discharge Diagnosis  Hip pain [M25.559] Right hip pain [M25.551]   Principal Problem:   Right hip pain Active Problems:   Hypothyroidism   Hyperlipidemia   HYPERTENSION, BENIGN ESSENTIAL   ATRIAL FIBRILLATION   Subtherapeutic anticoagulation      Past Medical History  Diagnosis Date  . Atrial fibrillation (HCC)     coumadin Rx  . Sick sinus syndrome (HCC)     s/p PPM  . Ataxia     followed by Dr. Erling Cruz  . Hyperlipidemia   . Hypothyroidism   . BPH (benign prostatic hyperplasia)   . Hypertension   . TIA (transient ischemic attack)     carotid dopplers 5/11: 0-39% bilateral; follow up due 03/2012  . Peripheral neuropathy [facility])     Past Surgical History  Procedure Laterality Date  . Pacemaker insertion      implanted 10/96, most recent Gen Change by Dr Olevia Perches 8/08       History of present illness and  Hospital Course:     Kindly see H&P for history of present illness and admission details, please review complete Labs, Consult reports and Test reports for all details in brief  HPI  from the history and physical done on the day of admission 03/17/2016  HPI: [name] is a 82 y.o. male with a history of Atrial fibrillation on Coumadin Rx, SSS S/P PAcemaker, HTN, and Hypothyroid who presents to  the ED with complaints of severe Right Hip pain and difficulty walking since 3 mechanical falls 4 days ago. He was evaluated in the ED and X-Rays were performed of the Pelvis which suggested a possible fracture of the pelvis, so a CT scan of the Pelvis was performed and was negative for fracture. He was referred for pain control and Physical Therapy.    Hospital Course   Right hip pain status post multiple falls - CT right pelvis with no evidence of fracture - PT consult, they recommended home PT, case management arranged for home PT/OT/RN/Aide - PRN pain medication  Urinary retention - But multiple attempts of in and out, patient remained with significant urinary retention, Foley catheter inserted, started on Flomax given his history of BPH, discussed with Dr. Louis Meckel who will arrange for outpatient voiding trial.  AKI - Improved with gentle hydration, creatinine 1.14 on discharge.  Atrial fibrillation - Heart rate controlled on amiodarone -on warfarin, INR 2.16 at day of discharge - Discussed  with Dr. Tamala Julian from [facility] guarding safety of anticoagulation, at this point we'll continue with current anticoagulation given patient has been multiple years on anticoagulation without significant complication, these falls are mechanical, but patient was instructed to follow with Dr. Rayann Heman in 1-2 weeks regarding reevaluation of  long-term anticoagulation.  Hypothyroidism - Continue with Synthroid  Hyperlipidemia - Continue with statin      Discharge Condition:  stable   Follow UP  Follow-up Information    Follow up with [facility].   Contact information:   [address] [place] [address] [phone]       Follow up with [facility], MD. Schedule an appointment as soon as possible for a visit in 1 week.   Specialty:  Internal Medicine   Contact information:   [place] [address] [phone]        Follow up with Thompson Grayer, MD. Schedule an appointment as soon as possible for a visit in 1 week.   Specialty:  Cardiology   Contact information:   [address] [place] [address] [phone]       Follow up with Ardis Hughs, MD.   Specialty:  Urology   Why:  will be called for voiding trial   Contact information:   [address] [place] [address] [phone]         Discharge Instructions  and  Discharge Medications     Discharge Instructions    Discharge instructions    Complete by:  As directed   Follow with Primary MD Merrilee Seashore, MD in 7 days   Get CBC, CMP, checked  by Primary MD next visit.    Activity: As tolerated with Full fall precautions use walker/cane & assistance as needed   Disposition Home    Diet: Heart Healthy  , with feeding assistance and aspiration precautions.  For Heart failure patients - Check your Weight same time everyday, if you gain over 2 pounds, or you develop in leg swelling, experience more shortness of breath or chest pain, call your Primary MD immediately. Follow Cardiac Low Salt Diet and 1.5 lit/day fluid restriction.   On your next visit with your primary care physician please Get Medicines reviewed and adjusted.   Please request your Prim.MD to go over all Hospital Tests and Procedure/Radiological results at the follow up, please get all Hospital records sent to your Prim MD by signing hospital release before you go home.   If you experience worsening of your admission symptoms, develop shortness of breath, life threatening emergency, suicidal or homicidal thoughts you must seek medical attention immediately by calling 911 or calling your MD immediately  if symptoms less severe.  You Must read complete instructions/literature along with all the possible adverse reactions/side effects for all the Medicines you take and that have been prescribed to you. Take any new Medicines after you have  completely understood and accpet all the possible adverse reactions/side effects.   Do not drive, operating heavy machinery, perform activities at heights, swimming or participation in water activities or provide baby sitting services if your were admitted for syncope or siezures until you have seen by Primary MD or a Neurologist and advised to do so again.  Do not drive when taking Pain medications.    Do not take more than prescribed Pain, Sleep and Anxiety Medications  Special Instructions: If you have smoked or chewed Tobacco  in the last 2 yrs please stop  smoking, stop any regular Alcohol  and or any Recreational drug use.  Wear Seat belts while driving.   Please note  You were cared for by a hospitalist during your hospital stay. If you have any questions about your discharge medications or the care you received while you were in the hospital after you are discharged, you can call the unit and asked to speak with the hospitalist on call if the hospitalist that took care of you is not available. Once you are discharged, your primary care physician will handle any further medical issues. Please note that NO REFILLS for any discharge medications will be authorized once you are discharged, as it is imperative that you return to your primary care physician (or establish a relationship with a primary care physician if you do not have one) for your aftercare needs so that they can reassess your need for medications and monitor your lab values.     Increase activity slowly    Complete by:  As directed             Medication List    TAKE these medications        acetaminophen 500 MG tablet  Commonly known as:  TYLENOL  Take 500 mg by mouth every 6 (six) hours as needed for moderate pain.     amiodarone 200 MG tablet  Commonly known as:  PACERONE  Take 100 mg by mouth daily.     ATIVAN 1 MG tablet  Generic drug:  LORazepam  Take 0.5 mg by mouth daily as needed. Irregular heartbeat/  anxiety     docusate sodium 100 MG capsule  Commonly known as:  COLACE  Take 100 mg by mouth daily as needed for mild constipation.     FIBER FORMULA Powd  Take 1 packet by mouth daily as needed (bowels).     HYDROcodone-acetaminophen 5-325 MG tablet  Commonly known as:  NORCO/VICODIN  Take 1 tablet by mouth every 6 (six) hours as needed for severe pain.     levothyroxine 25 MCG tablet  Commonly known as:  SYNTHROID, LEVOTHROID  Take 25 mcg by mouth daily before breakfast.     multivitamin tablet  Take 1 tablet by mouth daily.     simvastatin 20 MG tablet  Commonly known as:  ZOCOR  Take 20 mg by mouth at bedtime.     tamsulosin 0.4 MG Caps capsule  Commonly known as:  FLOMAX  Take 1 capsule (0.4 mg total) by mouth daily.     terbinafine 250 MG tablet  Commonly known as:  LAMISIL  Take 250 mg by mouth daily.     warfarin 5 MG tablet  Commonly known as:  COUMADIN  Take 1 tablet (5 mg total) by mouth as directed.     zolpidem 10 MG tablet  Commonly known as:  AMBIEN  Take 5 mg by mouth at bedtime as needed for sleep. sleep          Diet and Activity recommendation: See Discharge Instructions above   Consults obtained -  None Discussed with cardiology Dr. Tamala Julian via phone, discussed with Dr. Louis Meckel urology via phone.   Major procedures and Radiology Reports - PLEASE review detailed and final reports for all details, in brief -      Ct Hip Right Wo Contrast  03/16/2016  CLINICAL DATA:  Fall two times 3 days prior with persistent right hip pain. EXAM: CT OF THE RIGHT HIP WITHOUT CONTRAST TECHNIQUE: Multidetector CT imaging of  the right hip was performed according to the standard protocol. Multiplanar CT image reconstructions were also generated. COMPARISON:  Radiographs earlier this day. FINDINGS: The question cortical irregularity at the right superior pubic ramus is most consistent with calcified enthesopathy, no acute rami fracture is seen. Right hip well  seated in the acetabulum, no evidence of acute fracture, bony under mineralization limits assessment. Mild-to-moderate degenerative change of the right hip. No confluent soft tissue hematoma. IMPRESSION: No CT findings of acute fracture of the right pelvis or hip. The questioned superior pubic ramus fracture on radiograph is felt to be related to enthesopathy. If there is high clinical suspicion for occult hip fracture or the patient refuses to weightbear, consider further evaluation with MRI. Electronically Signed   By: Jeb Levering M.D.   On: 03/16/2016 23:57   Dg Hip Unilat  With Pelvis 2-3 Views Right  03/16/2016  CLINICAL DATA:  Fall twice on Thursday (3 days prior) landing on right hip with right hip pain. EXAM: DG HIP (WITH OR WITHOUT PELVIS) 2-3V RIGHT COMPARISON:  None. FINDINGS: The bones are under mineralized. Questionable nondisplaced fracture right superior pubic ramus. No additional fracture. Age related degenerative change of both hips. Both femoral heads are seated in the respective acetabula. IMPRESSION: Questionable nondisplaced right superior pubic ramus fracture. Electronically Signed   By: Jeb Levering M.D.   On: 03/16/2016 20:21    Micro Results     Recent Results (from the past 240 hour(s))  Urine culture     Status: None (Preliminary result)   Collection Time: 03/17/16  3:22 PM  Result Value Ref Range Status   Specimen Description URINE, CATHETERIZED  Final   Special Requests NONE  Final   Culture   Final    NO GROWTH < 24 HOURS Performed at [facility]    Report Status PENDING  Incomplete       Today   Subjective:   Albert Tainter today has no headache,no chest or abdominal pain,episode with urinary retention this am, foley inserted.  Objective:   Blood pressure 136/66, pulse 62, temperature 97.9 F (36.6 C), temperature source Oral, resp. rate 16, height 5\' 9"  (1.753 m), weight 71.4 kg (157 lb 6.5 oz), SpO2 99 %.   Intake/Output Summary  (Last 24 hours) at 03/18/16 1459 Last data filed at 03/18/16 1325  Gross per 24 hour  Intake    610 ml  Output   1900 ml  Net  -1290 ml    Exam  Awake Alert, Oriented X 3,  [place].AT,PERRAL Supple Neck,No JVD,  Symmetrical Chest wall movement, Good air movement bilaterally, CTAB No Gallops,Rubs or new Murmurs, No Parasternal Heave +ve B.Sounds, Abd Soft, No tenderness, No organomegaly appriciated, No rebound - guarding or rigidity. No Cyanosis, Clubbing or edema, No new Rash or bruise  Data Review   CBC w Diff: Lab Results  Component Value Date   WBC 6.4 03/17/2016   HGB 11.8* 03/17/2016   HCT 35.1* 03/17/2016   PLT 175 03/17/2016   LYMPHOPCT 15 03/16/2016   MONOPCT 7 03/16/2016   EOSPCT 1 03/16/2016   BASOPCT 0 03/16/2016    CMP: Lab Results  Component Value Date   NA 141 03/18/2016   K 4.9 03/18/2016   CL 104 03/18/2016   CO2 27 03/18/2016   BUN 33* 03/18/2016   CREATININE 1.14 03/18/2016   PROT 6.9 10/06/2011   ALBUMIN 3.6 10/06/2011   BILITOT 0.4 10/06/2011   ALKPHOS 64 10/06/2011   AST 18  10/06/2011   ALT 12 10/06/2011  .   Total Time in preparing paper work, data evaluation and todays exam - 35 minutes  Destinae Neubecker M.D on 03/18/2016 at 2:59 PM  [facility]   Office  [phone]

## 2016-03-18 NOTE — Progress Notes (Signed)
ANTICOAGULATION CONSULT NOTE - Follow Up Consult  Pharmacy Consult for warfarin Indication: atrial fibrillation  Allergies  Allergen Reactions  . Guaifenesin Er Palpitations    Pt can not take mucinex PM  . Prednisone Other (See Comments)    HIGH doses make patient feel "crazy"    Patient Measurements: Height: 5\' 9"  (175.3 cm) Weight: 157 lb 6.5 oz (71.4 kg) IBW/kg (Calculated) : 70.7   Vital Signs: Temp: 98.1 F (36.7 C) (04/18 0929) Temp Source: Axillary (04/18 0929) BP: 132/56 mmHg (04/18 0929) Pulse Rate: 76 (04/18 0929)  Labs:  Recent Labs  03/16/16 2049 03/17/16 0355 03/18/16 0353  HGB 12.2* 11.8*  --   HCT 35.9* 35.1*  --   PLT 175 175  --   LABPROT 21.8* 21.3* 23.9*  INR 1.91* 1.85* 2.16*  CREATININE 1.58* 1.48* 1.14    Estimated Creatinine Clearance: 43.9 mL/min (by C-G formula based on Cr of 1.14).    Assessment: 82 y.o. male with a history of Atrial fibrillation on Coumadin Rx, SSS S/P PAcemaker, HTN, and Hypothyroid who presents to the ED with complaints of severe Right Hip pain and difficulty walking since 3 mechanical falls 4 days ago.  Home dose of warfarin is 2.5mg  on Tues, Thurs, Sat and Sun and 5mg  on Mon, Wed Fri.  Last dose 4/16. Pharmacy consulted to dose warfarin.  03/18/2016 INR therapeutic H/H WNL No drug interactions noted  Goal of Therapy:  INR 2-3   Plan:  Warfarin 2.5mg  po at 1800 tonight Daily INR  Dolly Rias RPh 03/18/2016, 10:20 AM Pager [phone]

## 2016-03-18 NOTE — Progress Notes (Signed)
Physical Therapy Treatment Patient Details Name: [name] MRN: [id number] DOB: December 25, 1923 Today's Date: 03/18/2016    History of Present Illness 82 yo male admitted after sustaining fall and experiencing R hip pain and inability to ambulate. hx of SSs, pacemaker, HTN, A fib, TIA, ataxia.     PT Comments    Pt continues to have difficulty with bed mobility-unable to get to EOB from flat surface. Also he is unable to climb full flight of steps to get up to bedroom. Pt will need a hospital bed. Wife can only provide limited assistance (no physical assistance). Pt will need a HHA for assistance with ADLs.    Follow Up Recommendations  Home health PT. Home health aide      Equipment Recommendations  Hospital bed;Rolling walker with 5" wheels;3in1     Recommendations for Other Services       Precautions / Restrictions Precautions Precautions: Fall Restrictions Weight Bearing Restrictions: No    Mobility  Bed Mobility Overal bed mobility: Needs Assistance Bed Mobility: Supine to Sit     Supine to sit: Mod assist;HOB elevated     General bed mobility comments: Increased assistance for trunk to upright on today. Pt unable to get to EOB without assistance. Will likely need a hospital bed.  Transfers Overall transfer level: Needs assistance Equipment used: Rolling walker (2 wheeled) Transfers: Sit to/from Stand Sit to Stand: Min guard;From elevated surface         General transfer comment: close guard for safety. VCs safety, hand placement. Bed height raised for pt to rise unassisted.   Ambulation/Gait Ambulation/Gait assistance: Min guard Ambulation Distance (Feet): 125 Feet Assistive device: Rolling walker (2 wheeled) Gait Pattern/deviations: Step-to pattern;Step-through pattern;Decreased stride length     General Gait Details: close guard for safety. slow gait speed. Pt tolerated distance well.    Stairs            Wheelchair Mobility    Modified  Rankin (Stroke Patients Only)       Balance                                    Cognition Arousal/Alertness: Awake/alert Behavior During Therapy: WFL for tasks assessed/performed Overall Cognitive Status: Within Functional Limits for tasks assessed                      Exercises General Exercises - Lower Extremity Ankle Circles/Pumps: AROM;Both;10 reps;Supine Quad Sets: AROM;Both;10 reps;Supine Heel Slides: AAROM;10 reps;Supine;Right Hip ABduction/ADduction: AAROM;10 reps;Supine;Right    General Comments        Pertinent Vitals/Pain Pain Assessment: Faces Faces Pain Scale: Hurts even more Pain Location: R hip-posterior, low back pain Pain Descriptors / Indicators: Aching;Sore Pain Intervention(s): Monitored during session;Repositioned    Home Living                      Prior Function            PT Goals (current goals can now be found in the care plan section) Progress towards PT goals: Progressing toward goals    Frequency  Min 3X/week    PT Plan Current plan remains appropriate    Co-evaluation             End of Session Equipment Utilized During Treatment: Gait belt Activity Tolerance: Patient tolerated treatment well Patient left: in chair;with call bell/phone within reach;with chair alarm set  Time: [id number] PT Time Calculation (min) (ACUTE ONLY): 26 min  Charges:  $Gait Training: 8-22 mins $Therapeutic Exercise: 8-22 mins                    G Codes:      Weston Anna, MPT Pager: [phone]

## 2016-03-18 NOTE — Progress Notes (Signed)
[facility]    [facility] is providing the following services:  RW, 3n1, hospital bed  If patient discharges after hours, please call [phone].   [name] 03/18/2016, 1:06 PM

## 2016-03-18 NOTE — Progress Notes (Signed)
Pt is to be discharged with a foley and to follow up with urology.

## 2016-03-18 NOTE — Progress Notes (Signed)
This CM checked back with pt to follow up on choice for [facility] services. [facility] was chosen and referral was called to [facility] rep.  Pt asking for hospital bed at home.  MD asked for order for hospital bed.  Pt states his rolling walker at home doesn't work correctly. Per [facility] DME rep pt received that walker 04/18/11, which would make him ineligible for one until 04/17/16.  Pt states he will pay out of pocket for a new one. Info about hospital bed and rolling walker given to [facility] DME rep.  No other CM needs communicated. Marney Doctor RN,BSN,NCM [phone]

## 2016-03-18 NOTE — Discharge Instructions (Signed)
Follow with Primary MD Merrilee Seashore, MD in 7 days   Get CBC, CMP, checked  by Primary MD next visit.    Activity: As tolerated with Full fall precautions use walker/cane & assistance as needed   Disposition Home    Diet: Heart Healthy  , with feeding assistance and aspiration precautions.  For Heart failure patients - Check your Weight same time everyday, if you gain over 2 pounds, or you develop in leg swelling, experience more shortness of breath or chest pain, call your Primary MD immediately. Follow Cardiac Low Salt Diet and 1.5 lit/day fluid restriction.   On your next visit with your primary care physician please Get Medicines reviewed and adjusted.   Please request your Prim.MD to go over all Hospital Tests and Procedure/Radiological results at the follow up, please get all Hospital records sent to your Prim MD by signing hospital release before you go home.   If you experience worsening of your admission symptoms, develop shortness of breath, life threatening emergency, suicidal or homicidal thoughts you must seek medical attention immediately by calling 911 or calling your MD immediately  if symptoms less severe.  You Must read complete instructions/literature along with all the possible adverse reactions/side effects for all the Medicines you take and that have been prescribed to you. Take any new Medicines after you have completely understood and accpet all the possible adverse reactions/side effects.   Do not drive, operating heavy machinery, perform activities at heights, swimming or participation in water activities or provide baby sitting services if your were admitted for syncope or siezures until you have seen by Primary MD or a Neurologist and advised to do so again.  Do not drive when taking Pain medications.    Do not take more than prescribed Pain, Sleep and Anxiety Medications  Special Instructions: If you have smoked or chewed Tobacco  in the last 2 yrs  please stop smoking, stop any regular Alcohol  and or any Recreational drug use.  Wear Seat belts while driving.   Please note  You were cared for by a hospitalist during your hospital stay. If you have any questions about your discharge medications or the care you received while you were in the hospital after you are discharged, you can call the unit and asked to speak with the hospitalist on call if the hospitalist that took care of you is not available. Once you are discharged, your primary care physician will handle any further medical issues. Please note that NO REFILLS for any discharge medications will be authorized once you are discharged, as it is imperative that you return to your primary care physician (or establish a relationship with a primary care physician if you do not have one) for your aftercare needs so that they can reassess your need for medications and monitor your lab values.

## 2016-03-19 ENCOUNTER — Telehealth (HOSPITAL_BASED_OUTPATIENT_CLINIC_OR_DEPARTMENT_OTHER): Payer: Self-pay | Admitting: Emergency Medicine

## 2016-03-20 DIAGNOSIS — Z7901 Long term (current) use of anticoagulants: Secondary | ICD-10-CM | POA: Diagnosis not present

## 2016-03-20 DIAGNOSIS — Z87891 Personal history of nicotine dependence: Secondary | ICD-10-CM | POA: Diagnosis not present

## 2016-03-20 DIAGNOSIS — Z9181 History of falling: Secondary | ICD-10-CM | POA: Diagnosis not present

## 2016-03-20 DIAGNOSIS — I10 Essential (primary) hypertension: Secondary | ICD-10-CM | POA: Diagnosis not present

## 2016-03-20 DIAGNOSIS — Z8673 Personal history of transient ischemic attack (TIA), and cerebral infarction without residual deficits: Secondary | ICD-10-CM | POA: Diagnosis not present

## 2016-03-20 DIAGNOSIS — M25551 Pain in right hip: Secondary | ICD-10-CM | POA: Diagnosis not present

## 2016-03-20 DIAGNOSIS — E785 Hyperlipidemia, unspecified: Secondary | ICD-10-CM | POA: Diagnosis not present

## 2016-03-20 DIAGNOSIS — E039 Hypothyroidism, unspecified: Secondary | ICD-10-CM | POA: Diagnosis not present

## 2016-03-20 DIAGNOSIS — R270 Ataxia, unspecified: Secondary | ICD-10-CM | POA: Diagnosis not present

## 2016-03-20 DIAGNOSIS — Z950 Presence of cardiac pacemaker: Secondary | ICD-10-CM | POA: Diagnosis not present

## 2016-03-20 DIAGNOSIS — Z5181 Encounter for therapeutic drug level monitoring: Secondary | ICD-10-CM | POA: Diagnosis not present

## 2016-03-20 DIAGNOSIS — G629 Polyneuropathy, unspecified: Secondary | ICD-10-CM | POA: Diagnosis not present

## 2016-03-20 DIAGNOSIS — I4891 Unspecified atrial fibrillation: Secondary | ICD-10-CM | POA: Diagnosis not present

## 2016-03-21 DIAGNOSIS — E039 Hypothyroidism, unspecified: Secondary | ICD-10-CM | POA: Diagnosis not present

## 2016-03-21 DIAGNOSIS — I4891 Unspecified atrial fibrillation: Secondary | ICD-10-CM | POA: Diagnosis not present

## 2016-03-21 DIAGNOSIS — I10 Essential (primary) hypertension: Secondary | ICD-10-CM | POA: Diagnosis not present

## 2016-03-21 DIAGNOSIS — R270 Ataxia, unspecified: Secondary | ICD-10-CM | POA: Diagnosis not present

## 2016-03-21 DIAGNOSIS — G629 Polyneuropathy, unspecified: Secondary | ICD-10-CM | POA: Diagnosis not present

## 2016-03-21 DIAGNOSIS — M25551 Pain in right hip: Secondary | ICD-10-CM | POA: Diagnosis not present

## 2016-03-24 ENCOUNTER — Ambulatory Visit (INDEPENDENT_AMBULATORY_CARE_PROVIDER_SITE_OTHER): Payer: Medicare Other | Admitting: Cardiology

## 2016-03-24 DIAGNOSIS — Z5181 Encounter for therapeutic drug level monitoring: Secondary | ICD-10-CM

## 2016-03-24 DIAGNOSIS — G629 Polyneuropathy, unspecified: Secondary | ICD-10-CM | POA: Diagnosis not present

## 2016-03-24 DIAGNOSIS — M25551 Pain in right hip: Secondary | ICD-10-CM | POA: Diagnosis not present

## 2016-03-24 DIAGNOSIS — R270 Ataxia, unspecified: Secondary | ICD-10-CM | POA: Diagnosis not present

## 2016-03-24 DIAGNOSIS — I480 Paroxysmal atrial fibrillation: Secondary | ICD-10-CM

## 2016-03-24 DIAGNOSIS — I4891 Unspecified atrial fibrillation: Secondary | ICD-10-CM | POA: Diagnosis not present

## 2016-03-24 DIAGNOSIS — I10 Essential (primary) hypertension: Secondary | ICD-10-CM | POA: Diagnosis not present

## 2016-03-24 DIAGNOSIS — R338 Other retention of urine: Secondary | ICD-10-CM | POA: Diagnosis not present

## 2016-03-24 DIAGNOSIS — N400 Enlarged prostate without lower urinary tract symptoms: Secondary | ICD-10-CM | POA: Diagnosis not present

## 2016-03-24 DIAGNOSIS — E039 Hypothyroidism, unspecified: Secondary | ICD-10-CM | POA: Diagnosis not present

## 2016-03-24 LAB — POCT INR: INR: 3.4

## 2016-03-25 DIAGNOSIS — M25551 Pain in right hip: Secondary | ICD-10-CM | POA: Diagnosis not present

## 2016-03-25 DIAGNOSIS — I4891 Unspecified atrial fibrillation: Secondary | ICD-10-CM | POA: Diagnosis not present

## 2016-03-25 DIAGNOSIS — R270 Ataxia, unspecified: Secondary | ICD-10-CM | POA: Diagnosis not present

## 2016-03-25 DIAGNOSIS — E039 Hypothyroidism, unspecified: Secondary | ICD-10-CM | POA: Diagnosis not present

## 2016-03-25 DIAGNOSIS — G629 Polyneuropathy, unspecified: Secondary | ICD-10-CM | POA: Diagnosis not present

## 2016-03-25 DIAGNOSIS — I10 Essential (primary) hypertension: Secondary | ICD-10-CM | POA: Diagnosis not present

## 2016-03-28 ENCOUNTER — Telehealth: Payer: Self-pay | Admitting: Internal Medicine

## 2016-03-28 DIAGNOSIS — M25551 Pain in right hip: Secondary | ICD-10-CM | POA: Diagnosis not present

## 2016-03-28 DIAGNOSIS — I10 Essential (primary) hypertension: Secondary | ICD-10-CM | POA: Diagnosis not present

## 2016-03-28 DIAGNOSIS — I4891 Unspecified atrial fibrillation: Secondary | ICD-10-CM | POA: Diagnosis not present

## 2016-03-28 DIAGNOSIS — G629 Polyneuropathy, unspecified: Secondary | ICD-10-CM | POA: Diagnosis not present

## 2016-03-28 DIAGNOSIS — R270 Ataxia, unspecified: Secondary | ICD-10-CM | POA: Diagnosis not present

## 2016-03-28 DIAGNOSIS — E039 Hypothyroidism, unspecified: Secondary | ICD-10-CM | POA: Diagnosis not present

## 2016-03-31 ENCOUNTER — Ambulatory Visit (INDEPENDENT_AMBULATORY_CARE_PROVIDER_SITE_OTHER): Payer: Medicare Other | Admitting: Internal Medicine

## 2016-03-31 DIAGNOSIS — E039 Hypothyroidism, unspecified: Secondary | ICD-10-CM | POA: Diagnosis not present

## 2016-03-31 DIAGNOSIS — I480 Paroxysmal atrial fibrillation: Secondary | ICD-10-CM

## 2016-03-31 DIAGNOSIS — I10 Essential (primary) hypertension: Secondary | ICD-10-CM | POA: Diagnosis not present

## 2016-03-31 DIAGNOSIS — Z5181 Encounter for therapeutic drug level monitoring: Secondary | ICD-10-CM

## 2016-03-31 DIAGNOSIS — R270 Ataxia, unspecified: Secondary | ICD-10-CM | POA: Diagnosis not present

## 2016-03-31 DIAGNOSIS — I4891 Unspecified atrial fibrillation: Secondary | ICD-10-CM | POA: Diagnosis not present

## 2016-03-31 DIAGNOSIS — M25551 Pain in right hip: Secondary | ICD-10-CM | POA: Diagnosis not present

## 2016-03-31 DIAGNOSIS — G629 Polyneuropathy, unspecified: Secondary | ICD-10-CM | POA: Diagnosis not present

## 2016-03-31 LAB — POCT INR: INR: 1.7

## 2016-04-01 DIAGNOSIS — M25551 Pain in right hip: Secondary | ICD-10-CM | POA: Diagnosis not present

## 2016-04-01 DIAGNOSIS — E039 Hypothyroidism, unspecified: Secondary | ICD-10-CM | POA: Diagnosis not present

## 2016-04-01 DIAGNOSIS — R270 Ataxia, unspecified: Secondary | ICD-10-CM | POA: Diagnosis not present

## 2016-04-01 DIAGNOSIS — I10 Essential (primary) hypertension: Secondary | ICD-10-CM | POA: Diagnosis not present

## 2016-04-01 DIAGNOSIS — G629 Polyneuropathy, unspecified: Secondary | ICD-10-CM | POA: Diagnosis not present

## 2016-04-01 DIAGNOSIS — I4891 Unspecified atrial fibrillation: Secondary | ICD-10-CM | POA: Diagnosis not present

## 2016-04-02 DIAGNOSIS — N390 Urinary tract infection, site not specified: Secondary | ICD-10-CM | POA: Diagnosis not present

## 2016-04-02 DIAGNOSIS — M353 Polymyalgia rheumatica: Secondary | ICD-10-CM | POA: Diagnosis not present

## 2016-04-02 DIAGNOSIS — M791 Myalgia: Secondary | ICD-10-CM | POA: Diagnosis not present

## 2016-04-02 DIAGNOSIS — M6281 Muscle weakness (generalized): Secondary | ICD-10-CM | POA: Diagnosis not present

## 2016-04-03 DIAGNOSIS — E039 Hypothyroidism, unspecified: Secondary | ICD-10-CM | POA: Diagnosis not present

## 2016-04-03 DIAGNOSIS — M25551 Pain in right hip: Secondary | ICD-10-CM | POA: Diagnosis not present

## 2016-04-03 DIAGNOSIS — G629 Polyneuropathy, unspecified: Secondary | ICD-10-CM | POA: Diagnosis not present

## 2016-04-03 DIAGNOSIS — I10 Essential (primary) hypertension: Secondary | ICD-10-CM | POA: Diagnosis not present

## 2016-04-03 DIAGNOSIS — I4891 Unspecified atrial fibrillation: Secondary | ICD-10-CM | POA: Diagnosis not present

## 2016-04-03 DIAGNOSIS — R270 Ataxia, unspecified: Secondary | ICD-10-CM | POA: Diagnosis not present

## 2016-04-04 DIAGNOSIS — I4891 Unspecified atrial fibrillation: Secondary | ICD-10-CM | POA: Diagnosis not present

## 2016-04-04 DIAGNOSIS — M25551 Pain in right hip: Secondary | ICD-10-CM | POA: Diagnosis not present

## 2016-04-04 DIAGNOSIS — I10 Essential (primary) hypertension: Secondary | ICD-10-CM | POA: Diagnosis not present

## 2016-04-04 DIAGNOSIS — M549 Dorsalgia, unspecified: Secondary | ICD-10-CM | POA: Diagnosis not present

## 2016-04-04 DIAGNOSIS — G629 Polyneuropathy, unspecified: Secondary | ICD-10-CM | POA: Diagnosis not present

## 2016-04-04 DIAGNOSIS — E039 Hypothyroidism, unspecified: Secondary | ICD-10-CM | POA: Diagnosis not present

## 2016-04-04 DIAGNOSIS — R270 Ataxia, unspecified: Secondary | ICD-10-CM | POA: Diagnosis not present

## 2016-04-05 DIAGNOSIS — M25551 Pain in right hip: Secondary | ICD-10-CM | POA: Diagnosis not present

## 2016-04-05 DIAGNOSIS — I4891 Unspecified atrial fibrillation: Secondary | ICD-10-CM | POA: Diagnosis not present

## 2016-04-05 DIAGNOSIS — G629 Polyneuropathy, unspecified: Secondary | ICD-10-CM | POA: Diagnosis not present

## 2016-04-05 DIAGNOSIS — E039 Hypothyroidism, unspecified: Secondary | ICD-10-CM | POA: Diagnosis not present

## 2016-04-05 DIAGNOSIS — R270 Ataxia, unspecified: Secondary | ICD-10-CM | POA: Diagnosis not present

## 2016-04-05 DIAGNOSIS — I10 Essential (primary) hypertension: Secondary | ICD-10-CM | POA: Diagnosis not present

## 2016-04-07 ENCOUNTER — Ambulatory Visit (INDEPENDENT_AMBULATORY_CARE_PROVIDER_SITE_OTHER): Payer: Medicare Other | Admitting: Interventional Cardiology

## 2016-04-07 DIAGNOSIS — Z5181 Encounter for therapeutic drug level monitoring: Secondary | ICD-10-CM

## 2016-04-07 DIAGNOSIS — I480 Paroxysmal atrial fibrillation: Secondary | ICD-10-CM

## 2016-04-07 DIAGNOSIS — E039 Hypothyroidism, unspecified: Secondary | ICD-10-CM | POA: Diagnosis not present

## 2016-04-07 DIAGNOSIS — G629 Polyneuropathy, unspecified: Secondary | ICD-10-CM | POA: Diagnosis not present

## 2016-04-07 DIAGNOSIS — M25551 Pain in right hip: Secondary | ICD-10-CM | POA: Diagnosis not present

## 2016-04-07 DIAGNOSIS — I10 Essential (primary) hypertension: Secondary | ICD-10-CM | POA: Diagnosis not present

## 2016-04-07 DIAGNOSIS — M353 Polymyalgia rheumatica: Secondary | ICD-10-CM | POA: Diagnosis not present

## 2016-04-07 DIAGNOSIS — I4891 Unspecified atrial fibrillation: Secondary | ICD-10-CM | POA: Diagnosis not present

## 2016-04-07 DIAGNOSIS — M6281 Muscle weakness (generalized): Secondary | ICD-10-CM | POA: Diagnosis not present

## 2016-04-07 DIAGNOSIS — R270 Ataxia, unspecified: Secondary | ICD-10-CM | POA: Diagnosis not present

## 2016-04-07 LAB — POCT INR: INR: 6

## 2016-04-07 LAB — PROTIME-INR: INR: 5.3 — AB (ref 0.9–1.1)

## 2016-04-08 ENCOUNTER — Encounter: Payer: Medicare Other | Admitting: Physician Assistant

## 2016-04-08 DIAGNOSIS — I4891 Unspecified atrial fibrillation: Secondary | ICD-10-CM | POA: Diagnosis not present

## 2016-04-08 DIAGNOSIS — E039 Hypothyroidism, unspecified: Secondary | ICD-10-CM | POA: Diagnosis not present

## 2016-04-08 DIAGNOSIS — R270 Ataxia, unspecified: Secondary | ICD-10-CM | POA: Diagnosis not present

## 2016-04-08 DIAGNOSIS — M25551 Pain in right hip: Secondary | ICD-10-CM | POA: Diagnosis not present

## 2016-04-08 DIAGNOSIS — G629 Polyneuropathy, unspecified: Secondary | ICD-10-CM | POA: Diagnosis not present

## 2016-04-08 DIAGNOSIS — I10 Essential (primary) hypertension: Secondary | ICD-10-CM | POA: Diagnosis not present

## 2016-04-08 NOTE — Progress Notes (Signed)
   03/17/16 1141  PT Time Calculation  PT Start Time (ACUTE ONLY) 1017  PT Stop Time (ACUTE ONLY) 1039  PT Time Calculation (min) (ACUTE ONLY) 22 min  PT G-Codes **NOT FOR INPATIENT CLASS**  Functional Assessment Tool Used clinical judgement  Functional Limitation Mobility: Walking and moving around  Mobility: Walking and Moving Around Current Status [id number]) CJ  Mobility: Walking and Moving Around Goal Status [id number]) CI  PT General Charges  $$ ACUTE PT VISIT 1 Procedure  PT Evaluation  $PT Eval Low Complexity 1 Procedure   Weston Anna, MPT [phone]

## 2016-04-09 ENCOUNTER — Ambulatory Visit: Payer: Medicare Other | Admitting: Neurology

## 2016-04-09 ENCOUNTER — Telehealth: Payer: Self-pay | Admitting: Internal Medicine

## 2016-04-09 DIAGNOSIS — R262 Difficulty in walking, not elsewhere classified: Secondary | ICD-10-CM | POA: Diagnosis not present

## 2016-04-09 DIAGNOSIS — F411 Generalized anxiety disorder: Secondary | ICD-10-CM | POA: Diagnosis not present

## 2016-04-09 DIAGNOSIS — M6282 Rhabdomyolysis: Secondary | ICD-10-CM | POA: Diagnosis not present

## 2016-04-09 DIAGNOSIS — E782 Mixed hyperlipidemia: Secondary | ICD-10-CM | POA: Diagnosis not present

## 2016-04-09 DIAGNOSIS — G64 Other disorders of peripheral nervous system: Secondary | ICD-10-CM | POA: Diagnosis not present

## 2016-04-09 DIAGNOSIS — M352 Behcet's disease: Secondary | ICD-10-CM | POA: Diagnosis not present

## 2016-04-09 DIAGNOSIS — M353 Polymyalgia rheumatica: Secondary | ICD-10-CM | POA: Diagnosis not present

## 2016-04-09 DIAGNOSIS — M6281 Muscle weakness (generalized): Secondary | ICD-10-CM | POA: Diagnosis not present

## 2016-04-09 DIAGNOSIS — R1311 Dysphagia, oral phase: Secondary | ICD-10-CM | POA: Diagnosis not present

## 2016-04-09 DIAGNOSIS — I4891 Unspecified atrial fibrillation: Secondary | ICD-10-CM | POA: Diagnosis not present

## 2016-04-09 DIAGNOSIS — G9009 Other idiopathic peripheral autonomic neuropathy: Secondary | ICD-10-CM | POA: Diagnosis not present

## 2016-04-09 NOTE — Telephone Encounter (Signed)
New message     Talk to the nurse soon.  He is going into the nursing home today at 1pm[facility].  He has a question about his medications.

## 2016-04-09 NOTE — Telephone Encounter (Signed)
Called and spoke with patient.  He fell several weeks back and has not been able to recover.  He is going to nursing facility today for rehab for 14-28 days depending on how he does.  He just wanted [place] to know his PCP stopped his Zocor as he was having lots of muscle pains.  Thinks its all due to the falls he had several weeks back(fell twice in one day).  He just wanted [place] to know it was being held for now.

## 2016-04-10 DIAGNOSIS — E039 Hypothyroidism, unspecified: Secondary | ICD-10-CM | POA: Diagnosis not present

## 2016-04-10 DIAGNOSIS — R262 Difficulty in walking, not elsewhere classified: Secondary | ICD-10-CM | POA: Diagnosis not present

## 2016-04-10 DIAGNOSIS — Z79899 Other long term (current) drug therapy: Secondary | ICD-10-CM | POA: Diagnosis not present

## 2016-04-10 DIAGNOSIS — I10 Essential (primary) hypertension: Secondary | ICD-10-CM | POA: Diagnosis not present

## 2016-04-10 DIAGNOSIS — M353 Polymyalgia rheumatica: Secondary | ICD-10-CM | POA: Diagnosis not present

## 2016-04-10 DIAGNOSIS — M352 Behcet's disease: Secondary | ICD-10-CM | POA: Diagnosis not present

## 2016-04-10 DIAGNOSIS — E035 Myxedema coma: Secondary | ICD-10-CM | POA: Diagnosis not present

## 2016-04-10 DIAGNOSIS — E559 Vitamin D deficiency, unspecified: Secondary | ICD-10-CM | POA: Diagnosis not present

## 2016-04-10 DIAGNOSIS — D649 Anemia, unspecified: Secondary | ICD-10-CM | POA: Diagnosis not present

## 2016-04-10 DIAGNOSIS — R1311 Dysphagia, oral phase: Secondary | ICD-10-CM | POA: Diagnosis not present

## 2016-04-10 DIAGNOSIS — G9009 Other idiopathic peripheral autonomic neuropathy: Secondary | ICD-10-CM | POA: Diagnosis not present

## 2016-04-10 DIAGNOSIS — M6281 Muscle weakness (generalized): Secondary | ICD-10-CM | POA: Diagnosis not present

## 2016-04-10 DIAGNOSIS — E785 Hyperlipidemia, unspecified: Secondary | ICD-10-CM | POA: Diagnosis not present

## 2016-04-11 DIAGNOSIS — E039 Hypothyroidism, unspecified: Secondary | ICD-10-CM | POA: Diagnosis not present

## 2016-04-11 DIAGNOSIS — R1311 Dysphagia, oral phase: Secondary | ICD-10-CM | POA: Diagnosis not present

## 2016-04-11 DIAGNOSIS — G9009 Other idiopathic peripheral autonomic neuropathy: Secondary | ICD-10-CM | POA: Diagnosis not present

## 2016-04-11 DIAGNOSIS — R531 Weakness: Secondary | ICD-10-CM | POA: Diagnosis not present

## 2016-04-11 DIAGNOSIS — G629 Polyneuropathy, unspecified: Secondary | ICD-10-CM | POA: Diagnosis not present

## 2016-04-11 DIAGNOSIS — M549 Dorsalgia, unspecified: Secondary | ICD-10-CM | POA: Diagnosis not present

## 2016-04-11 DIAGNOSIS — M353 Polymyalgia rheumatica: Secondary | ICD-10-CM | POA: Diagnosis not present

## 2016-04-11 DIAGNOSIS — M6281 Muscle weakness (generalized): Secondary | ICD-10-CM | POA: Diagnosis not present

## 2016-04-11 DIAGNOSIS — M352 Behcet's disease: Secondary | ICD-10-CM | POA: Diagnosis not present

## 2016-04-11 DIAGNOSIS — R262 Difficulty in walking, not elsewhere classified: Secondary | ICD-10-CM | POA: Diagnosis not present

## 2016-04-11 DIAGNOSIS — R2689 Other abnormalities of gait and mobility: Secondary | ICD-10-CM | POA: Diagnosis not present

## 2016-04-11 DIAGNOSIS — F419 Anxiety disorder, unspecified: Secondary | ICD-10-CM | POA: Diagnosis not present

## 2016-04-11 DIAGNOSIS — I4891 Unspecified atrial fibrillation: Secondary | ICD-10-CM | POA: Diagnosis not present

## 2016-04-12 DIAGNOSIS — R1311 Dysphagia, oral phase: Secondary | ICD-10-CM | POA: Diagnosis not present

## 2016-04-12 DIAGNOSIS — M353 Polymyalgia rheumatica: Secondary | ICD-10-CM | POA: Diagnosis not present

## 2016-04-12 DIAGNOSIS — M352 Behcet's disease: Secondary | ICD-10-CM | POA: Diagnosis not present

## 2016-04-12 DIAGNOSIS — G9009 Other idiopathic peripheral autonomic neuropathy: Secondary | ICD-10-CM | POA: Diagnosis not present

## 2016-04-12 DIAGNOSIS — M6281 Muscle weakness (generalized): Secondary | ICD-10-CM | POA: Diagnosis not present

## 2016-04-12 DIAGNOSIS — R262 Difficulty in walking, not elsewhere classified: Secondary | ICD-10-CM | POA: Diagnosis not present

## 2016-04-14 ENCOUNTER — Encounter: Payer: Self-pay | Admitting: Physician Assistant

## 2016-04-14 DIAGNOSIS — M352 Behcet's disease: Secondary | ICD-10-CM | POA: Diagnosis not present

## 2016-04-14 DIAGNOSIS — M353 Polymyalgia rheumatica: Secondary | ICD-10-CM | POA: Diagnosis not present

## 2016-04-14 DIAGNOSIS — R262 Difficulty in walking, not elsewhere classified: Secondary | ICD-10-CM | POA: Diagnosis not present

## 2016-04-14 DIAGNOSIS — M6281 Muscle weakness (generalized): Secondary | ICD-10-CM | POA: Diagnosis not present

## 2016-04-14 DIAGNOSIS — R1311 Dysphagia, oral phase: Secondary | ICD-10-CM | POA: Diagnosis not present

## 2016-04-14 DIAGNOSIS — G9009 Other idiopathic peripheral autonomic neuropathy: Secondary | ICD-10-CM | POA: Diagnosis not present

## 2016-04-15 DIAGNOSIS — M6281 Muscle weakness (generalized): Secondary | ICD-10-CM | POA: Diagnosis not present

## 2016-04-15 DIAGNOSIS — G64 Other disorders of peripheral nervous system: Secondary | ICD-10-CM | POA: Diagnosis not present

## 2016-04-15 DIAGNOSIS — M353 Polymyalgia rheumatica: Secondary | ICD-10-CM | POA: Diagnosis not present

## 2016-04-15 DIAGNOSIS — G9009 Other idiopathic peripheral autonomic neuropathy: Secondary | ICD-10-CM | POA: Diagnosis not present

## 2016-04-15 DIAGNOSIS — R262 Difficulty in walking, not elsewhere classified: Secondary | ICD-10-CM | POA: Diagnosis not present

## 2016-04-15 DIAGNOSIS — R1311 Dysphagia, oral phase: Secondary | ICD-10-CM | POA: Diagnosis not present

## 2016-04-15 DIAGNOSIS — M352 Behcet's disease: Secondary | ICD-10-CM | POA: Diagnosis not present

## 2016-04-15 DIAGNOSIS — I4891 Unspecified atrial fibrillation: Secondary | ICD-10-CM | POA: Diagnosis not present

## 2016-04-15 DIAGNOSIS — K219 Gastro-esophageal reflux disease without esophagitis: Secondary | ICD-10-CM | POA: Diagnosis not present

## 2016-04-16 DIAGNOSIS — M353 Polymyalgia rheumatica: Secondary | ICD-10-CM | POA: Diagnosis not present

## 2016-04-16 DIAGNOSIS — M352 Behcet's disease: Secondary | ICD-10-CM | POA: Diagnosis not present

## 2016-04-16 DIAGNOSIS — G9009 Other idiopathic peripheral autonomic neuropathy: Secondary | ICD-10-CM | POA: Diagnosis not present

## 2016-04-16 DIAGNOSIS — R262 Difficulty in walking, not elsewhere classified: Secondary | ICD-10-CM | POA: Diagnosis not present

## 2016-04-16 DIAGNOSIS — M6281 Muscle weakness (generalized): Secondary | ICD-10-CM | POA: Diagnosis not present

## 2016-04-16 DIAGNOSIS — R1311 Dysphagia, oral phase: Secondary | ICD-10-CM | POA: Diagnosis not present

## 2016-04-17 DIAGNOSIS — R262 Difficulty in walking, not elsewhere classified: Secondary | ICD-10-CM | POA: Diagnosis not present

## 2016-04-17 DIAGNOSIS — G9009 Other idiopathic peripheral autonomic neuropathy: Secondary | ICD-10-CM | POA: Diagnosis not present

## 2016-04-17 DIAGNOSIS — R05 Cough: Secondary | ICD-10-CM | POA: Diagnosis not present

## 2016-04-17 DIAGNOSIS — R1311 Dysphagia, oral phase: Secondary | ICD-10-CM | POA: Diagnosis not present

## 2016-04-17 DIAGNOSIS — M6281 Muscle weakness (generalized): Secondary | ICD-10-CM | POA: Diagnosis not present

## 2016-04-17 DIAGNOSIS — M353 Polymyalgia rheumatica: Secondary | ICD-10-CM | POA: Diagnosis not present

## 2016-04-17 DIAGNOSIS — M352 Behcet's disease: Secondary | ICD-10-CM | POA: Diagnosis not present

## 2016-04-18 DIAGNOSIS — F411 Generalized anxiety disorder: Secondary | ICD-10-CM | POA: Diagnosis not present

## 2016-04-18 DIAGNOSIS — I4891 Unspecified atrial fibrillation: Secondary | ICD-10-CM | POA: Diagnosis not present

## 2016-04-18 DIAGNOSIS — M6281 Muscle weakness (generalized): Secondary | ICD-10-CM | POA: Diagnosis not present

## 2016-04-18 DIAGNOSIS — G9009 Other idiopathic peripheral autonomic neuropathy: Secondary | ICD-10-CM | POA: Diagnosis not present

## 2016-04-18 DIAGNOSIS — B370 Candidal stomatitis: Secondary | ICD-10-CM | POA: Diagnosis not present

## 2016-04-18 DIAGNOSIS — R1311 Dysphagia, oral phase: Secondary | ICD-10-CM | POA: Diagnosis not present

## 2016-04-18 DIAGNOSIS — M352 Behcet's disease: Secondary | ICD-10-CM | POA: Diagnosis not present

## 2016-04-18 DIAGNOSIS — M353 Polymyalgia rheumatica: Secondary | ICD-10-CM | POA: Diagnosis not present

## 2016-04-18 DIAGNOSIS — K121 Other forms of stomatitis: Secondary | ICD-10-CM | POA: Diagnosis not present

## 2016-04-18 DIAGNOSIS — R262 Difficulty in walking, not elsewhere classified: Secondary | ICD-10-CM | POA: Diagnosis not present

## 2016-04-18 DIAGNOSIS — J209 Acute bronchitis, unspecified: Secondary | ICD-10-CM | POA: Diagnosis not present

## 2016-04-21 DIAGNOSIS — R262 Difficulty in walking, not elsewhere classified: Secondary | ICD-10-CM | POA: Diagnosis not present

## 2016-04-21 DIAGNOSIS — M352 Behcet's disease: Secondary | ICD-10-CM | POA: Diagnosis not present

## 2016-04-21 DIAGNOSIS — M353 Polymyalgia rheumatica: Secondary | ICD-10-CM | POA: Diagnosis not present

## 2016-04-21 DIAGNOSIS — R1311 Dysphagia, oral phase: Secondary | ICD-10-CM | POA: Diagnosis not present

## 2016-04-21 DIAGNOSIS — G9009 Other idiopathic peripheral autonomic neuropathy: Secondary | ICD-10-CM | POA: Diagnosis not present

## 2016-04-21 DIAGNOSIS — M6281 Muscle weakness (generalized): Secondary | ICD-10-CM | POA: Diagnosis not present

## 2016-04-22 DIAGNOSIS — R262 Difficulty in walking, not elsewhere classified: Secondary | ICD-10-CM | POA: Diagnosis not present

## 2016-04-22 DIAGNOSIS — R1311 Dysphagia, oral phase: Secondary | ICD-10-CM | POA: Diagnosis not present

## 2016-04-22 DIAGNOSIS — M6281 Muscle weakness (generalized): Secondary | ICD-10-CM | POA: Diagnosis not present

## 2016-04-22 DIAGNOSIS — M353 Polymyalgia rheumatica: Secondary | ICD-10-CM | POA: Diagnosis not present

## 2016-04-22 DIAGNOSIS — M352 Behcet's disease: Secondary | ICD-10-CM | POA: Diagnosis not present

## 2016-04-22 DIAGNOSIS — G9009 Other idiopathic peripheral autonomic neuropathy: Secondary | ICD-10-CM | POA: Diagnosis not present

## 2016-04-23 DIAGNOSIS — R262 Difficulty in walking, not elsewhere classified: Secondary | ICD-10-CM | POA: Diagnosis not present

## 2016-04-23 DIAGNOSIS — M6281 Muscle weakness (generalized): Secondary | ICD-10-CM | POA: Diagnosis not present

## 2016-04-23 DIAGNOSIS — M5136 Other intervertebral disc degeneration, lumbar region: Secondary | ICD-10-CM | POA: Diagnosis not present

## 2016-04-23 DIAGNOSIS — R1311 Dysphagia, oral phase: Secondary | ICD-10-CM | POA: Diagnosis not present

## 2016-04-23 DIAGNOSIS — M545 Low back pain: Secondary | ICD-10-CM | POA: Diagnosis not present

## 2016-04-23 DIAGNOSIS — M352 Behcet's disease: Secondary | ICD-10-CM | POA: Diagnosis not present

## 2016-04-23 DIAGNOSIS — E039 Hypothyroidism, unspecified: Secondary | ICD-10-CM | POA: Diagnosis not present

## 2016-04-23 DIAGNOSIS — M353 Polymyalgia rheumatica: Secondary | ICD-10-CM | POA: Diagnosis not present

## 2016-04-23 DIAGNOSIS — G9009 Other idiopathic peripheral autonomic neuropathy: Secondary | ICD-10-CM | POA: Diagnosis not present

## 2016-04-24 DIAGNOSIS — M353 Polymyalgia rheumatica: Secondary | ICD-10-CM | POA: Diagnosis not present

## 2016-04-24 DIAGNOSIS — M545 Low back pain: Secondary | ICD-10-CM | POA: Diagnosis not present

## 2016-04-24 DIAGNOSIS — R05 Cough: Secondary | ICD-10-CM | POA: Diagnosis not present

## 2016-04-24 DIAGNOSIS — I10 Essential (primary) hypertension: Secondary | ICD-10-CM | POA: Diagnosis not present

## 2016-04-24 DIAGNOSIS — E039 Hypothyroidism, unspecified: Secondary | ICD-10-CM | POA: Diagnosis not present

## 2016-04-24 DIAGNOSIS — R1310 Dysphagia, unspecified: Secondary | ICD-10-CM | POA: Diagnosis not present

## 2016-04-24 DIAGNOSIS — Z9181 History of falling: Secondary | ICD-10-CM | POA: Diagnosis not present

## 2016-04-24 DIAGNOSIS — M6281 Muscle weakness (generalized): Secondary | ICD-10-CM | POA: Diagnosis not present

## 2016-04-24 DIAGNOSIS — B001 Herpesviral vesicular dermatitis: Secondary | ICD-10-CM | POA: Diagnosis not present

## 2016-04-24 DIAGNOSIS — M25551 Pain in right hip: Secondary | ICD-10-CM | POA: Diagnosis not present

## 2016-04-24 DIAGNOSIS — I4891 Unspecified atrial fibrillation: Secondary | ICD-10-CM | POA: Diagnosis not present

## 2016-04-24 DIAGNOSIS — Z7901 Long term (current) use of anticoagulants: Secondary | ICD-10-CM | POA: Diagnosis not present

## 2016-04-24 DIAGNOSIS — I480 Paroxysmal atrial fibrillation: Secondary | ICD-10-CM | POA: Diagnosis not present

## 2016-04-24 DIAGNOSIS — R1312 Dysphagia, oropharyngeal phase: Secondary | ICD-10-CM | POA: Diagnosis not present

## 2016-04-25 DIAGNOSIS — Z9181 History of falling: Secondary | ICD-10-CM | POA: Diagnosis not present

## 2016-04-25 DIAGNOSIS — M25551 Pain in right hip: Secondary | ICD-10-CM | POA: Diagnosis not present

## 2016-04-25 DIAGNOSIS — R1310 Dysphagia, unspecified: Secondary | ICD-10-CM | POA: Diagnosis not present

## 2016-04-25 DIAGNOSIS — R1312 Dysphagia, oropharyngeal phase: Secondary | ICD-10-CM | POA: Diagnosis not present

## 2016-04-25 DIAGNOSIS — M6281 Muscle weakness (generalized): Secondary | ICD-10-CM | POA: Diagnosis not present

## 2016-04-25 DIAGNOSIS — M545 Low back pain: Secondary | ICD-10-CM | POA: Diagnosis not present

## 2016-04-26 DIAGNOSIS — Z9181 History of falling: Secondary | ICD-10-CM | POA: Diagnosis not present

## 2016-04-26 DIAGNOSIS — M25551 Pain in right hip: Secondary | ICD-10-CM | POA: Diagnosis not present

## 2016-04-26 DIAGNOSIS — R1310 Dysphagia, unspecified: Secondary | ICD-10-CM | POA: Diagnosis not present

## 2016-04-26 DIAGNOSIS — M6281 Muscle weakness (generalized): Secondary | ICD-10-CM | POA: Diagnosis not present

## 2016-04-26 DIAGNOSIS — M545 Low back pain: Secondary | ICD-10-CM | POA: Diagnosis not present

## 2016-04-26 DIAGNOSIS — R1312 Dysphagia, oropharyngeal phase: Secondary | ICD-10-CM | POA: Diagnosis not present

## 2016-04-27 DIAGNOSIS — R05 Cough: Secondary | ICD-10-CM | POA: Diagnosis not present

## 2016-04-28 DIAGNOSIS — M25551 Pain in right hip: Secondary | ICD-10-CM | POA: Diagnosis not present

## 2016-04-28 DIAGNOSIS — R1312 Dysphagia, oropharyngeal phase: Secondary | ICD-10-CM | POA: Diagnosis not present

## 2016-04-28 DIAGNOSIS — M545 Low back pain: Secondary | ICD-10-CM | POA: Diagnosis not present

## 2016-04-28 DIAGNOSIS — R1310 Dysphagia, unspecified: Secondary | ICD-10-CM | POA: Diagnosis not present

## 2016-04-28 DIAGNOSIS — R319 Hematuria, unspecified: Secondary | ICD-10-CM | POA: Diagnosis not present

## 2016-04-28 DIAGNOSIS — M6281 Muscle weakness (generalized): Secondary | ICD-10-CM | POA: Diagnosis not present

## 2016-04-28 DIAGNOSIS — Z9181 History of falling: Secondary | ICD-10-CM | POA: Diagnosis not present

## 2016-04-28 DIAGNOSIS — Z79899 Other long term (current) drug therapy: Secondary | ICD-10-CM | POA: Diagnosis not present

## 2016-04-28 DIAGNOSIS — N390 Urinary tract infection, site not specified: Secondary | ICD-10-CM | POA: Diagnosis not present

## 2016-04-28 DIAGNOSIS — R4182 Altered mental status, unspecified: Secondary | ICD-10-CM | POA: Diagnosis not present

## 2016-04-29 DIAGNOSIS — I4891 Unspecified atrial fibrillation: Secondary | ICD-10-CM | POA: Diagnosis not present

## 2016-04-29 DIAGNOSIS — Z7901 Long term (current) use of anticoagulants: Secondary | ICD-10-CM | POA: Diagnosis not present

## 2016-04-29 DIAGNOSIS — R1310 Dysphagia, unspecified: Secondary | ICD-10-CM | POA: Diagnosis not present

## 2016-04-29 DIAGNOSIS — Z9181 History of falling: Secondary | ICD-10-CM | POA: Diagnosis not present

## 2016-04-29 DIAGNOSIS — R1312 Dysphagia, oropharyngeal phase: Secondary | ICD-10-CM | POA: Diagnosis not present

## 2016-04-29 DIAGNOSIS — M25551 Pain in right hip: Secondary | ICD-10-CM | POA: Diagnosis not present

## 2016-04-29 DIAGNOSIS — M6281 Muscle weakness (generalized): Secondary | ICD-10-CM | POA: Diagnosis not present

## 2016-04-29 DIAGNOSIS — M545 Low back pain: Secondary | ICD-10-CM | POA: Diagnosis not present

## 2016-04-30 DIAGNOSIS — M545 Low back pain: Secondary | ICD-10-CM | POA: Diagnosis not present

## 2016-04-30 DIAGNOSIS — M25551 Pain in right hip: Secondary | ICD-10-CM | POA: Diagnosis not present

## 2016-04-30 DIAGNOSIS — Z9181 History of falling: Secondary | ICD-10-CM | POA: Diagnosis not present

## 2016-04-30 DIAGNOSIS — R1312 Dysphagia, oropharyngeal phase: Secondary | ICD-10-CM | POA: Diagnosis not present

## 2016-04-30 DIAGNOSIS — R1310 Dysphagia, unspecified: Secondary | ICD-10-CM | POA: Diagnosis not present

## 2016-04-30 DIAGNOSIS — M6281 Muscle weakness (generalized): Secondary | ICD-10-CM | POA: Diagnosis not present

## 2016-05-01 DIAGNOSIS — R05 Cough: Secondary | ICD-10-CM | POA: Diagnosis not present

## 2016-05-01 DIAGNOSIS — R1312 Dysphagia, oropharyngeal phase: Secondary | ICD-10-CM | POA: Diagnosis not present

## 2016-05-01 DIAGNOSIS — Z9181 History of falling: Secondary | ICD-10-CM | POA: Diagnosis not present

## 2016-05-01 DIAGNOSIS — R7989 Other specified abnormal findings of blood chemistry: Secondary | ICD-10-CM | POA: Diagnosis not present

## 2016-05-01 DIAGNOSIS — M545 Low back pain: Secondary | ICD-10-CM | POA: Diagnosis not present

## 2016-05-01 DIAGNOSIS — M6281 Muscle weakness (generalized): Secondary | ICD-10-CM | POA: Diagnosis not present

## 2016-05-01 DIAGNOSIS — M47814 Spondylosis without myelopathy or radiculopathy, thoracic region: Secondary | ICD-10-CM | POA: Diagnosis not present

## 2016-05-01 DIAGNOSIS — R1310 Dysphagia, unspecified: Secondary | ICD-10-CM | POA: Diagnosis not present

## 2016-05-01 DIAGNOSIS — I4891 Unspecified atrial fibrillation: Secondary | ICD-10-CM | POA: Diagnosis not present

## 2016-05-01 DIAGNOSIS — M25551 Pain in right hip: Secondary | ICD-10-CM | POA: Diagnosis not present

## 2016-05-02 DIAGNOSIS — M25551 Pain in right hip: Secondary | ICD-10-CM | POA: Diagnosis not present

## 2016-05-02 DIAGNOSIS — M6281 Muscle weakness (generalized): Secondary | ICD-10-CM | POA: Diagnosis not present

## 2016-05-02 DIAGNOSIS — M545 Low back pain: Secondary | ICD-10-CM | POA: Diagnosis not present

## 2016-05-02 DIAGNOSIS — R1310 Dysphagia, unspecified: Secondary | ICD-10-CM | POA: Diagnosis not present

## 2016-05-02 DIAGNOSIS — R1312 Dysphagia, oropharyngeal phase: Secondary | ICD-10-CM | POA: Diagnosis not present

## 2016-05-02 DIAGNOSIS — Z9181 History of falling: Secondary | ICD-10-CM | POA: Diagnosis not present

## 2016-05-05 DIAGNOSIS — E039 Hypothyroidism, unspecified: Secondary | ICD-10-CM | POA: Diagnosis not present

## 2016-05-05 DIAGNOSIS — M25551 Pain in right hip: Secondary | ICD-10-CM | POA: Diagnosis not present

## 2016-05-05 DIAGNOSIS — I480 Paroxysmal atrial fibrillation: Secondary | ICD-10-CM | POA: Diagnosis not present

## 2016-05-05 DIAGNOSIS — R1310 Dysphagia, unspecified: Secondary | ICD-10-CM | POA: Diagnosis not present

## 2016-05-05 DIAGNOSIS — M6281 Muscle weakness (generalized): Secondary | ICD-10-CM | POA: Diagnosis not present

## 2016-05-05 DIAGNOSIS — R1312 Dysphagia, oropharyngeal phase: Secondary | ICD-10-CM | POA: Diagnosis not present

## 2016-05-05 DIAGNOSIS — M545 Low back pain: Secondary | ICD-10-CM | POA: Diagnosis not present

## 2016-05-05 DIAGNOSIS — I10 Essential (primary) hypertension: Secondary | ICD-10-CM | POA: Diagnosis not present

## 2016-05-05 DIAGNOSIS — Z9181 History of falling: Secondary | ICD-10-CM | POA: Diagnosis not present

## 2016-05-05 DIAGNOSIS — M353 Polymyalgia rheumatica: Secondary | ICD-10-CM | POA: Diagnosis not present

## 2016-05-06 DIAGNOSIS — Z9181 History of falling: Secondary | ICD-10-CM | POA: Diagnosis not present

## 2016-05-06 DIAGNOSIS — Z7901 Long term (current) use of anticoagulants: Secondary | ICD-10-CM | POA: Diagnosis not present

## 2016-05-06 DIAGNOSIS — I4891 Unspecified atrial fibrillation: Secondary | ICD-10-CM | POA: Diagnosis not present

## 2016-05-06 DIAGNOSIS — M25551 Pain in right hip: Secondary | ICD-10-CM | POA: Diagnosis not present

## 2016-05-06 DIAGNOSIS — R7989 Other specified abnormal findings of blood chemistry: Secondary | ICD-10-CM | POA: Diagnosis not present

## 2016-05-06 DIAGNOSIS — R1310 Dysphagia, unspecified: Secondary | ICD-10-CM | POA: Diagnosis not present

## 2016-05-06 DIAGNOSIS — R1312 Dysphagia, oropharyngeal phase: Secondary | ICD-10-CM | POA: Diagnosis not present

## 2016-05-06 DIAGNOSIS — M6281 Muscle weakness (generalized): Secondary | ICD-10-CM | POA: Diagnosis not present

## 2016-05-06 DIAGNOSIS — E0590 Thyrotoxicosis, unspecified without thyrotoxic crisis or storm: Secondary | ICD-10-CM | POA: Diagnosis not present

## 2016-05-06 DIAGNOSIS — M545 Low back pain: Secondary | ICD-10-CM | POA: Diagnosis not present

## 2016-05-07 DIAGNOSIS — R1312 Dysphagia, oropharyngeal phase: Secondary | ICD-10-CM | POA: Diagnosis not present

## 2016-05-07 DIAGNOSIS — R1310 Dysphagia, unspecified: Secondary | ICD-10-CM | POA: Diagnosis not present

## 2016-05-07 DIAGNOSIS — M25551 Pain in right hip: Secondary | ICD-10-CM | POA: Diagnosis not present

## 2016-05-07 DIAGNOSIS — M6281 Muscle weakness (generalized): Secondary | ICD-10-CM | POA: Diagnosis not present

## 2016-05-07 DIAGNOSIS — M545 Low back pain: Secondary | ICD-10-CM | POA: Diagnosis not present

## 2016-05-07 DIAGNOSIS — Z9181 History of falling: Secondary | ICD-10-CM | POA: Diagnosis not present

## 2016-05-08 DIAGNOSIS — R1312 Dysphagia, oropharyngeal phase: Secondary | ICD-10-CM | POA: Diagnosis not present

## 2016-05-08 DIAGNOSIS — M25551 Pain in right hip: Secondary | ICD-10-CM | POA: Diagnosis not present

## 2016-05-08 DIAGNOSIS — M6281 Muscle weakness (generalized): Secondary | ICD-10-CM | POA: Diagnosis not present

## 2016-05-08 DIAGNOSIS — R1310 Dysphagia, unspecified: Secondary | ICD-10-CM | POA: Diagnosis not present

## 2016-05-08 DIAGNOSIS — M545 Low back pain: Secondary | ICD-10-CM | POA: Diagnosis not present

## 2016-05-08 DIAGNOSIS — Z9181 History of falling: Secondary | ICD-10-CM | POA: Diagnosis not present

## 2016-05-09 DIAGNOSIS — R1310 Dysphagia, unspecified: Secondary | ICD-10-CM | POA: Diagnosis not present

## 2016-05-09 DIAGNOSIS — M25551 Pain in right hip: Secondary | ICD-10-CM | POA: Diagnosis not present

## 2016-05-09 DIAGNOSIS — M545 Low back pain: Secondary | ICD-10-CM | POA: Diagnosis not present

## 2016-05-09 DIAGNOSIS — R1312 Dysphagia, oropharyngeal phase: Secondary | ICD-10-CM | POA: Diagnosis not present

## 2016-05-09 DIAGNOSIS — Z9181 History of falling: Secondary | ICD-10-CM | POA: Diagnosis not present

## 2016-05-09 DIAGNOSIS — M6281 Muscle weakness (generalized): Secondary | ICD-10-CM | POA: Diagnosis not present

## 2016-05-09 DIAGNOSIS — I4891 Unspecified atrial fibrillation: Secondary | ICD-10-CM | POA: Diagnosis not present

## 2016-05-09 DIAGNOSIS — Z7901 Long term (current) use of anticoagulants: Secondary | ICD-10-CM | POA: Diagnosis not present

## 2016-05-11 DIAGNOSIS — N320 Bladder-neck obstruction: Secondary | ICD-10-CM | POA: Diagnosis not present

## 2016-05-11 DIAGNOSIS — K5900 Constipation, unspecified: Secondary | ICD-10-CM | POA: Diagnosis not present

## 2016-05-11 DIAGNOSIS — M353 Polymyalgia rheumatica: Secondary | ICD-10-CM | POA: Diagnosis not present

## 2016-05-12 DIAGNOSIS — I4891 Unspecified atrial fibrillation: Secondary | ICD-10-CM | POA: Diagnosis not present

## 2016-05-12 DIAGNOSIS — R1310 Dysphagia, unspecified: Secondary | ICD-10-CM | POA: Diagnosis not present

## 2016-05-12 DIAGNOSIS — M545 Low back pain: Secondary | ICD-10-CM | POA: Diagnosis not present

## 2016-05-12 DIAGNOSIS — R1312 Dysphagia, oropharyngeal phase: Secondary | ICD-10-CM | POA: Diagnosis not present

## 2016-05-12 DIAGNOSIS — M6281 Muscle weakness (generalized): Secondary | ICD-10-CM | POA: Diagnosis not present

## 2016-05-12 DIAGNOSIS — M25551 Pain in right hip: Secondary | ICD-10-CM | POA: Diagnosis not present

## 2016-05-12 DIAGNOSIS — Z9181 History of falling: Secondary | ICD-10-CM | POA: Diagnosis not present

## 2016-05-12 DIAGNOSIS — M2550 Pain in unspecified joint: Secondary | ICD-10-CM | POA: Diagnosis not present

## 2016-05-12 LAB — PROTIME-INR: INR: 2 — AB (ref 0.9–1.1)

## 2016-05-15 ENCOUNTER — Telehealth: Payer: Self-pay | Admitting: *Deleted

## 2016-05-15 DIAGNOSIS — N401 Enlarged prostate with lower urinary tract symptoms: Secondary | ICD-10-CM | POA: Diagnosis not present

## 2016-05-15 DIAGNOSIS — R791 Abnormal coagulation profile: Secondary | ICD-10-CM | POA: Diagnosis not present

## 2016-05-15 DIAGNOSIS — Z7901 Long term (current) use of anticoagulants: Secondary | ICD-10-CM | POA: Diagnosis not present

## 2016-05-15 DIAGNOSIS — Z5181 Encounter for therapeutic drug level monitoring: Secondary | ICD-10-CM | POA: Diagnosis not present

## 2016-05-15 DIAGNOSIS — I480 Paroxysmal atrial fibrillation: Secondary | ICD-10-CM | POA: Diagnosis not present

## 2016-05-15 DIAGNOSIS — Z87440 Personal history of urinary (tract) infections: Secondary | ICD-10-CM | POA: Diagnosis not present

## 2016-05-15 DIAGNOSIS — Z87891 Personal history of nicotine dependence: Secondary | ICD-10-CM | POA: Diagnosis not present

## 2016-05-15 DIAGNOSIS — R338 Other retention of urine: Secondary | ICD-10-CM | POA: Diagnosis not present

## 2016-05-15 DIAGNOSIS — Z950 Presence of cardiac pacemaker: Secondary | ICD-10-CM | POA: Diagnosis not present

## 2016-05-15 DIAGNOSIS — G629 Polyneuropathy, unspecified: Secondary | ICD-10-CM | POA: Diagnosis not present

## 2016-05-15 DIAGNOSIS — J449 Chronic obstructive pulmonary disease, unspecified: Secondary | ICD-10-CM | POA: Diagnosis not present

## 2016-05-15 DIAGNOSIS — Z8673 Personal history of transient ischemic attack (TIA), and cerebral infarction without residual deficits: Secondary | ICD-10-CM | POA: Diagnosis not present

## 2016-05-15 DIAGNOSIS — M353 Polymyalgia rheumatica: Secondary | ICD-10-CM | POA: Diagnosis not present

## 2016-05-15 DIAGNOSIS — E785 Hyperlipidemia, unspecified: Secondary | ICD-10-CM | POA: Diagnosis not present

## 2016-05-15 DIAGNOSIS — R296 Repeated falls: Secondary | ICD-10-CM | POA: Diagnosis not present

## 2016-05-15 DIAGNOSIS — M160 Bilateral primary osteoarthritis of hip: Secondary | ICD-10-CM | POA: Diagnosis not present

## 2016-05-15 NOTE — Telephone Encounter (Signed)
Pt was recently d/c from [facility] to [place] and has [facility] services with [facility]. Spoke with Jenny Reichmann RN at [facility] and gave orders to have the pt's INR checked on 05/16/16 and called to [facility].

## 2016-05-16 ENCOUNTER — Ambulatory Visit (INDEPENDENT_AMBULATORY_CARE_PROVIDER_SITE_OTHER): Payer: Medicare Other | Admitting: Internal Medicine

## 2016-05-16 DIAGNOSIS — G629 Polyneuropathy, unspecified: Secondary | ICD-10-CM | POA: Diagnosis not present

## 2016-05-16 DIAGNOSIS — Z5181 Encounter for therapeutic drug level monitoring: Secondary | ICD-10-CM

## 2016-05-16 DIAGNOSIS — I480 Paroxysmal atrial fibrillation: Secondary | ICD-10-CM

## 2016-05-16 DIAGNOSIS — R791 Abnormal coagulation profile: Secondary | ICD-10-CM | POA: Diagnosis not present

## 2016-05-16 DIAGNOSIS — M353 Polymyalgia rheumatica: Secondary | ICD-10-CM | POA: Diagnosis not present

## 2016-05-16 DIAGNOSIS — R296 Repeated falls: Secondary | ICD-10-CM | POA: Diagnosis not present

## 2016-05-16 DIAGNOSIS — M160 Bilateral primary osteoarthritis of hip: Secondary | ICD-10-CM | POA: Diagnosis not present

## 2016-05-16 LAB — POCT INR: INR: 3.2

## 2016-05-19 DIAGNOSIS — I480 Paroxysmal atrial fibrillation: Secondary | ICD-10-CM | POA: Diagnosis not present

## 2016-05-19 DIAGNOSIS — R791 Abnormal coagulation profile: Secondary | ICD-10-CM | POA: Diagnosis not present

## 2016-05-19 DIAGNOSIS — R296 Repeated falls: Secondary | ICD-10-CM | POA: Diagnosis not present

## 2016-05-19 DIAGNOSIS — G629 Polyneuropathy, unspecified: Secondary | ICD-10-CM | POA: Diagnosis not present

## 2016-05-19 DIAGNOSIS — M353 Polymyalgia rheumatica: Secondary | ICD-10-CM | POA: Diagnosis not present

## 2016-05-19 DIAGNOSIS — M160 Bilateral primary osteoarthritis of hip: Secondary | ICD-10-CM | POA: Diagnosis not present

## 2016-05-20 ENCOUNTER — Other Ambulatory Visit: Payer: Self-pay

## 2016-05-20 DIAGNOSIS — R791 Abnormal coagulation profile: Secondary | ICD-10-CM | POA: Diagnosis not present

## 2016-05-20 DIAGNOSIS — I480 Paroxysmal atrial fibrillation: Secondary | ICD-10-CM | POA: Diagnosis not present

## 2016-05-20 DIAGNOSIS — M353 Polymyalgia rheumatica: Secondary | ICD-10-CM | POA: Diagnosis not present

## 2016-05-20 DIAGNOSIS — M160 Bilateral primary osteoarthritis of hip: Secondary | ICD-10-CM | POA: Diagnosis not present

## 2016-05-20 DIAGNOSIS — R296 Repeated falls: Secondary | ICD-10-CM | POA: Diagnosis not present

## 2016-05-20 DIAGNOSIS — G629 Polyneuropathy, unspecified: Secondary | ICD-10-CM | POA: Diagnosis not present

## 2016-05-20 MED ORDER — AMIODARONE HCL 200 MG PO TABS: 100.0000 mg | ORAL_TABLET | Freq: Every day | ORAL | Status: DC

## 2016-05-20 NOTE — Telephone Encounter (Signed)
Thompson Grayer, MD at 11/07/2015 2:41 PM  amiodarone (PACERONE) 200 MG tabletTake 100 mg by mouth daily ATRIAL FIBRILLATION  Maintaining sinus rhythm with amiodarone 100mg  daily. He tried reducing amiodarone to 50mg  daily but says that he did not feel as well and thus returned to 100mg  daily.  Patient Instructions     Medication Instructions:  Your physician recommends that you continue on your current medications as directed. Please refer to the Current Medication list given to you today.

## 2016-05-21 DIAGNOSIS — R296 Repeated falls: Secondary | ICD-10-CM | POA: Diagnosis not present

## 2016-05-21 DIAGNOSIS — M353 Polymyalgia rheumatica: Secondary | ICD-10-CM | POA: Diagnosis not present

## 2016-05-21 DIAGNOSIS — R791 Abnormal coagulation profile: Secondary | ICD-10-CM | POA: Diagnosis not present

## 2016-05-21 DIAGNOSIS — G629 Polyneuropathy, unspecified: Secondary | ICD-10-CM | POA: Diagnosis not present

## 2016-05-21 DIAGNOSIS — I480 Paroxysmal atrial fibrillation: Secondary | ICD-10-CM | POA: Diagnosis not present

## 2016-05-21 DIAGNOSIS — M160 Bilateral primary osteoarthritis of hip: Secondary | ICD-10-CM | POA: Diagnosis not present

## 2016-05-22 ENCOUNTER — Inpatient Hospital Stay (HOSPITAL_COMMUNITY)
Admission: EM | Admit: 2016-05-22 | Discharge: 2016-06-02 | DRG: 477 | Disposition: A | Discharge status: Skilled Nursing Facility | Payer: Medicare Other | Attending: Internal Medicine | Admitting: Internal Medicine

## 2016-05-22 ENCOUNTER — Emergency Department (HOSPITAL_COMMUNITY): Payer: Medicare Other

## 2016-05-22 ENCOUNTER — Encounter (HOSPITAL_COMMUNITY): Payer: Self-pay

## 2016-05-22 DIAGNOSIS — R4182 Altered mental status, unspecified: Secondary | ICD-10-CM | POA: Diagnosis not present

## 2016-05-22 DIAGNOSIS — S32021D Stable burst fracture of second lumbar vertebra, subsequent encounter for fracture with routine healing: Secondary | ICD-10-CM | POA: Diagnosis not present

## 2016-05-22 DIAGNOSIS — R269 Unspecified abnormalities of gait and mobility: Secondary | ICD-10-CM | POA: Diagnosis not present

## 2016-05-22 DIAGNOSIS — N400 Enlarged prostate without lower urinary tract symptoms: Secondary | ICD-10-CM | POA: Diagnosis present

## 2016-05-22 DIAGNOSIS — I482 Chronic atrial fibrillation: Secondary | ICD-10-CM | POA: Diagnosis not present

## 2016-05-22 DIAGNOSIS — M546 Pain in thoracic spine: Secondary | ICD-10-CM | POA: Diagnosis not present

## 2016-05-22 DIAGNOSIS — Z66 Do not resuscitate: Secondary | ICD-10-CM | POA: Diagnosis present

## 2016-05-22 DIAGNOSIS — R6883 Chills (without fever): Secondary | ICD-10-CM | POA: Diagnosis not present

## 2016-05-22 DIAGNOSIS — I10 Essential (primary) hypertension: Secondary | ICD-10-CM | POA: Diagnosis present

## 2016-05-22 DIAGNOSIS — S32010D Wedge compression fracture of first lumbar vertebra, subsequent encounter for fracture with routine healing: Secondary | ICD-10-CM | POA: Diagnosis not present

## 2016-05-22 DIAGNOSIS — N390 Urinary tract infection, site not specified: Secondary | ICD-10-CM | POA: Diagnosis present

## 2016-05-22 DIAGNOSIS — Z7401 Bed confinement status: Secondary | ICD-10-CM | POA: Diagnosis not present

## 2016-05-22 DIAGNOSIS — M4806 Spinal stenosis, lumbar region: Secondary | ICD-10-CM | POA: Diagnosis present

## 2016-05-22 DIAGNOSIS — S32001A Stable burst fracture of unspecified lumbar vertebra, initial encounter for closed fracture: Secondary | ICD-10-CM | POA: Diagnosis not present

## 2016-05-22 DIAGNOSIS — I350 Nonrheumatic aortic (valve) stenosis: Secondary | ICD-10-CM | POA: Diagnosis not present

## 2016-05-22 DIAGNOSIS — Z8673 Personal history of transient ischemic attack (TIA), and cerebral infarction without residual deficits: Secondary | ICD-10-CM | POA: Diagnosis not present

## 2016-05-22 DIAGNOSIS — I495 Sick sinus syndrome: Secondary | ICD-10-CM | POA: Diagnosis present

## 2016-05-22 DIAGNOSIS — M25551 Pain in right hip: Secondary | ICD-10-CM | POA: Diagnosis present

## 2016-05-22 DIAGNOSIS — D649 Anemia, unspecified: Secondary | ICD-10-CM | POA: Diagnosis present

## 2016-05-22 DIAGNOSIS — M549 Dorsalgia, unspecified: Secondary | ICD-10-CM | POA: Diagnosis not present

## 2016-05-22 DIAGNOSIS — Z7901 Long term (current) use of anticoagulants: Secondary | ICD-10-CM | POA: Diagnosis not present

## 2016-05-22 DIAGNOSIS — I4891 Unspecified atrial fibrillation: Secondary | ICD-10-CM | POA: Diagnosis not present

## 2016-05-22 DIAGNOSIS — N183 Chronic kidney disease, stage 3 (moderate): Secondary | ICD-10-CM | POA: Diagnosis not present

## 2016-05-22 DIAGNOSIS — R509 Fever, unspecified: Secondary | ICD-10-CM | POA: Insufficient documentation

## 2016-05-22 DIAGNOSIS — G9340 Encephalopathy, unspecified: Secondary | ICD-10-CM | POA: Diagnosis not present

## 2016-05-22 DIAGNOSIS — M8448XA Pathological fracture, other site, initial encounter for fracture: Secondary | ICD-10-CM | POA: Diagnosis not present

## 2016-05-22 DIAGNOSIS — M4646 Discitis, unspecified, lumbar region: Principal | ICD-10-CM | POA: Insufficient documentation

## 2016-05-22 DIAGNOSIS — S329XXA Fracture of unspecified parts of lumbosacral spine and pelvis, initial encounter for closed fracture: Secondary | ICD-10-CM | POA: Diagnosis not present

## 2016-05-22 DIAGNOSIS — R7982 Elevated C-reactive protein (CRP): Secondary | ICD-10-CM | POA: Diagnosis not present

## 2016-05-22 DIAGNOSIS — G319 Degenerative disease of nervous system, unspecified: Secondary | ICD-10-CM | POA: Diagnosis not present

## 2016-05-22 DIAGNOSIS — Z79899 Other long term (current) drug therapy: Secondary | ICD-10-CM | POA: Diagnosis not present

## 2016-05-22 DIAGNOSIS — K219 Gastro-esophageal reflux disease without esophagitis: Secondary | ICD-10-CM | POA: Diagnosis not present

## 2016-05-22 DIAGNOSIS — G629 Polyneuropathy, unspecified: Secondary | ICD-10-CM | POA: Diagnosis present

## 2016-05-22 DIAGNOSIS — M353 Polymyalgia rheumatica: Secondary | ICD-10-CM | POA: Diagnosis present

## 2016-05-22 DIAGNOSIS — I480 Paroxysmal atrial fibrillation: Secondary | ICD-10-CM | POA: Diagnosis not present

## 2016-05-22 DIAGNOSIS — I5030 Unspecified diastolic (congestive) heart failure: Secondary | ICD-10-CM | POA: Diagnosis not present

## 2016-05-22 DIAGNOSIS — Z7952 Long term (current) use of systemic steroids: Secondary | ICD-10-CM | POA: Diagnosis not present

## 2016-05-22 DIAGNOSIS — E876 Hypokalemia: Secondary | ICD-10-CM | POA: Diagnosis present

## 2016-05-22 DIAGNOSIS — I129 Hypertensive chronic kidney disease with stage 1 through stage 4 chronic kidney disease, or unspecified chronic kidney disease: Secondary | ICD-10-CM | POA: Diagnosis present

## 2016-05-22 DIAGNOSIS — S32021A Stable burst fracture of second lumbar vertebra, initial encounter for closed fracture: Secondary | ICD-10-CM | POA: Diagnosis present

## 2016-05-22 DIAGNOSIS — R262 Difficulty in walking, not elsewhere classified: Secondary | ICD-10-CM | POA: Diagnosis not present

## 2016-05-22 DIAGNOSIS — Z950 Presence of cardiac pacemaker: Secondary | ICD-10-CM | POA: Diagnosis present

## 2016-05-22 DIAGNOSIS — M545 Low back pain: Secondary | ICD-10-CM | POA: Diagnosis not present

## 2016-05-22 DIAGNOSIS — R7881 Bacteremia: Secondary | ICD-10-CM | POA: Diagnosis not present

## 2016-05-22 DIAGNOSIS — K5900 Constipation, unspecified: Secondary | ICD-10-CM | POA: Diagnosis present

## 2016-05-22 DIAGNOSIS — S0990XA Unspecified injury of head, initial encounter: Secondary | ICD-10-CM | POA: Diagnosis not present

## 2016-05-22 DIAGNOSIS — B952 Enterococcus as the cause of diseases classified elsewhere: Secondary | ICD-10-CM | POA: Diagnosis not present

## 2016-05-22 DIAGNOSIS — Z87891 Personal history of nicotine dependence: Secondary | ICD-10-CM | POA: Diagnosis not present

## 2016-05-22 DIAGNOSIS — E039 Hypothyroidism, unspecified: Secondary | ICD-10-CM | POA: Diagnosis present

## 2016-05-22 DIAGNOSIS — S32021G Stable burst fracture of second lumbar vertebra, subsequent encounter for fracture with delayed healing: Secondary | ICD-10-CM | POA: Diagnosis not present

## 2016-05-22 DIAGNOSIS — E507 Other ocular manifestations of vitamin A deficiency: Secondary | ICD-10-CM | POA: Diagnosis not present

## 2016-05-22 DIAGNOSIS — Z888 Allergy status to other drugs, medicaments and biological substances status: Secondary | ICD-10-CM

## 2016-05-22 DIAGNOSIS — S32020G Wedge compression fracture of second lumbar vertebra, subsequent encounter for fracture with delayed healing: Secondary | ICD-10-CM

## 2016-05-22 DIAGNOSIS — E860 Dehydration: Secondary | ICD-10-CM | POA: Diagnosis not present

## 2016-05-22 DIAGNOSIS — IMO0002 Reserved for concepts with insufficient information to code with codable children: Secondary | ICD-10-CM

## 2016-05-22 DIAGNOSIS — R1310 Dysphagia, unspecified: Secondary | ICD-10-CM | POA: Diagnosis not present

## 2016-05-22 DIAGNOSIS — R1312 Dysphagia, oropharyngeal phase: Secondary | ICD-10-CM | POA: Diagnosis not present

## 2016-05-22 DIAGNOSIS — M4856XA Collapsed vertebra, not elsewhere classified, lumbar region, initial encounter for fracture: Secondary | ICD-10-CM | POA: Diagnosis not present

## 2016-05-22 DIAGNOSIS — I739 Peripheral vascular disease, unspecified: Secondary | ICD-10-CM | POA: Diagnosis present

## 2016-05-22 DIAGNOSIS — R05 Cough: Secondary | ICD-10-CM | POA: Diagnosis not present

## 2016-05-22 DIAGNOSIS — I69391 Dysphagia following cerebral infarction: Secondary | ICD-10-CM | POA: Diagnosis not present

## 2016-05-22 DIAGNOSIS — S32010G Wedge compression fracture of first lumbar vertebra, subsequent encounter for fracture with delayed healing: Secondary | ICD-10-CM | POA: Diagnosis not present

## 2016-05-22 DIAGNOSIS — E785 Hyperlipidemia, unspecified: Secondary | ICD-10-CM | POA: Diagnosis present

## 2016-05-22 DIAGNOSIS — T148 Other injury of unspecified body region: Secondary | ICD-10-CM | POA: Diagnosis not present

## 2016-05-22 DIAGNOSIS — R5381 Other malaise: Secondary | ICD-10-CM | POA: Diagnosis not present

## 2016-05-22 DIAGNOSIS — M6281 Muscle weakness (generalized): Secondary | ICD-10-CM | POA: Diagnosis not present

## 2016-05-22 LAB — BASIC METABOLIC PANEL
Anion gap: 9 (ref 5–15)
BUN: 18 mg/dL (ref 6–20)
CHLORIDE: 97 mmol/L — AB (ref 101–111)
CO2: 25 mmol/L (ref 22–32)
CREATININE: 1.17 mg/dL (ref 0.61–1.24)
Calcium: 8.6 mg/dL — ABNORMAL LOW (ref 8.9–10.3)
GFR calc Af Amer: >60 mL/min (ref >60)
GFR calc non Af Amer: 53 mL/min — ABNORMAL LOW (ref >60)
GLUCOSE: 158 mg/dL — AB (ref 65–99)
POTASSIUM: 4.1 mmol/L (ref 3.5–5.1)
SODIUM: 131 mmol/L — AB (ref 135–145)

## 2016-05-22 LAB — URINALYSIS, ROUTINE W REFLEX MICROSCOPIC
BILIRUBIN URINE: NEGATIVE
GLUCOSE, UA: NEGATIVE mg/dL
HGB URINE DIPSTICK: NEGATIVE
KETONES UR: NEGATIVE mg/dL
Leukocytes, UA: NEGATIVE
Nitrite: NEGATIVE
Protein, ur: NEGATIVE mg/dL
Specific Gravity, Urine: 1.016 (ref 1.005–1.030)
pH: 7 (ref 5.0–8.0)

## 2016-05-22 LAB — CBC WITH DIFFERENTIAL/PLATELET
Basophils Absolute: 0 10*3/uL (ref 0.0–0.1)
Basophils Relative: 0 %
EOS ABS: 0 10*3/uL (ref 0.0–0.7)
EOS PCT: 0 %
HCT: 31.2 % — ABNORMAL LOW (ref 39.0–52.0)
Hemoglobin: 10.1 g/dL — ABNORMAL LOW (ref 13.0–17.0)
LYMPHS ABS: 1.2 10*3/uL (ref 0.7–4.0)
LYMPHS PCT: 10 %
MCH: 30 pg (ref 26.0–34.0)
MCHC: 32.4 g/dL (ref 30.0–36.0)
MCV: 92.6 fL (ref 78.0–100.0)
MONO ABS: 0.7 10*3/uL (ref 0.1–1.0)
Monocytes Relative: 6 %
Neutro Abs: 10.6 10*3/uL — ABNORMAL HIGH (ref 1.7–7.7)
Neutrophils Relative %: 84 %
PLATELETS: 256 10*3/uL (ref 150–400)
RBC: 3.37 MIL/uL — AB (ref 4.22–5.81)
RDW: 14.6 % (ref 11.5–15.5)
WBC: 12.6 10*3/uL — AB (ref 4.0–10.5)

## 2016-05-22 LAB — SEDIMENTATION RATE: SED RATE: 140 mm/h — AB (ref 0–16)

## 2016-05-22 LAB — C-REACTIVE PROTEIN: CRP: 17.1 mg/dL — ABNORMAL HIGH (ref <1.0)

## 2016-05-22 LAB — PROTIME-INR
INR: 3.18 — ABNORMAL HIGH (ref 0.00–1.49)
PROTHROMBIN TIME: 32 s — AB (ref 11.6–15.2)

## 2016-05-22 MED ORDER — ZINC OXIDE 12.8 % EX OINT
TOPICAL_OINTMENT | CUTANEOUS | Status: DC | PRN
  Filled 2016-05-22: qty 56.7

## 2016-05-22 MED ORDER — ONDANSETRON HCL 4 MG PO TABS: 4.0000 mg | ORAL_TABLET | Freq: Four times a day (QID) | ORAL | Status: DC | PRN

## 2016-05-22 MED ORDER — FIBER FORMULA PO POWD: 1.0000 | Freq: Every day | ORAL | Status: DC | PRN

## 2016-05-22 MED ORDER — OXYCODONE HCL 5 MG PO TABS
5.0000 mg | ORAL_TABLET | ORAL | Status: DC | PRN
  Administered 2016-05-22 – 2016-05-31 (×16): 5 mg via ORAL
  Filled 2016-05-22 (×17): qty 1

## 2016-05-22 MED ORDER — DEXTROSE 5 % IV SOLN
2.0000 g | INTRAVENOUS | Status: DC
  Administered 2016-05-22: 2 g via INTRAVENOUS
  Filled 2016-05-22: qty 2

## 2016-05-22 MED ORDER — ACETAMINOPHEN 325 MG PO TABS
650.0000 mg | ORAL_TABLET | Freq: Four times a day (QID) | ORAL | Status: DC | PRN
  Administered 2016-05-23 – 2016-06-01 (×4): 650 mg via ORAL
  Filled 2016-05-22 (×4): qty 2

## 2016-05-22 MED ORDER — SIMVASTATIN 20 MG PO TABS
20.0000 mg | ORAL_TABLET | Freq: Every day | ORAL | Status: DC
  Administered 2016-05-22 – 2016-06-01 (×11): 20 mg via ORAL
  Filled 2016-05-22 (×11): qty 1

## 2016-05-22 MED ORDER — ACETAMINOPHEN 650 MG RE SUPP: 650.0000 mg | Freq: Four times a day (QID) | RECTAL | Status: DC | PRN

## 2016-05-22 MED ORDER — ONDANSETRON HCL 4 MG/2ML IJ SOLN
4.0000 mg | Freq: Once | INTRAMUSCULAR | Status: AC
  Administered 2016-05-22: 4 mg via INTRAVENOUS
  Filled 2016-05-22: qty 2

## 2016-05-22 MED ORDER — SODIUM CHLORIDE 0.9 % IV BOLUS (SEPSIS)
1000.0000 mL | Freq: Once | INTRAVENOUS | Status: AC
  Administered 2016-05-22: 1000 mL via INTRAVENOUS

## 2016-05-22 MED ORDER — ONDANSETRON HCL 4 MG/2ML IJ SOLN: 4.0000 mg | Freq: Four times a day (QID) | INTRAMUSCULAR | Status: DC | PRN

## 2016-05-22 MED ORDER — VANCOMYCIN HCL IN DEXTROSE 750-5 MG/150ML-% IV SOLN
750.0000 mg | Freq: Two times a day (BID) | INTRAVENOUS | Status: DC
  Filled 2016-05-22: qty 150

## 2016-05-22 MED ORDER — IBUPROFEN 200 MG PO TABS
600.0000 mg | ORAL_TABLET | Freq: Four times a day (QID) | ORAL | Status: DC | PRN
  Administered 2016-05-24 – 2016-05-28 (×3): 600 mg via ORAL
  Filled 2016-05-22 (×4): qty 3

## 2016-05-22 MED ORDER — VANCOMYCIN HCL 10 G IV SOLR
1500.0000 mg | Freq: Once | INTRAVENOUS | Status: AC
  Administered 2016-05-22: 1500 mg via INTRAVENOUS
  Filled 2016-05-22: qty 1500

## 2016-05-22 MED ORDER — MORPHINE SULFATE (PF) 4 MG/ML IV SOLN
4.0000 mg | Freq: Once | INTRAVENOUS | Status: AC
  Administered 2016-05-22: 4 mg via INTRAVENOUS
  Filled 2016-05-22: qty 1

## 2016-05-22 MED ORDER — AMIODARONE HCL 200 MG PO TABS
100.0000 mg | ORAL_TABLET | Freq: Every day | ORAL | Status: DC
  Administered 2016-05-23 – 2016-06-02 (×10): 100 mg via ORAL
  Filled 2016-05-22 (×10): qty 1

## 2016-05-22 MED ORDER — LEVOTHYROXINE SODIUM 25 MCG PO TABS
25.0000 ug | ORAL_TABLET | Freq: Every day | ORAL | Status: DC
  Administered 2016-05-23 – 2016-06-02 (×10): 25 ug via ORAL
  Filled 2016-05-22 (×10): qty 1

## 2016-05-22 MED ORDER — DEXTROSE 5 % IV SOLN: 1.0000 g | INTRAVENOUS | Status: DC

## 2016-05-22 MED ORDER — POLYETHYLENE GLYCOL 3350 17 G PO PACK
17.0000 g | PACK | Freq: Every day | ORAL | Status: DC | PRN
  Administered 2016-05-24 – 2016-05-28 (×3): 17 g via ORAL
  Filled 2016-05-22 (×3): qty 1

## 2016-05-22 NOTE — Progress Notes (Signed)
Orthopedic Tech Progress Note Patient Details:  [name] [birth year] [id number]  Patient ID: [name], male   DOB: 04/17/1924, 82 y.o.   MRN: [id number] [place] will come in am to apply brace.  Karolee Stamps 05/22/2016, 8:02 PM

## 2016-05-22 NOTE — ED Notes (Signed)
Pt arrives EMS with c/o leg and back pain. Fentayl 100mg  in route by EMS. Fell inm April with rehab after arrives from home.

## 2016-05-22 NOTE — Progress Notes (Signed)
Arrived from ED. Alert and oriented to person, place, and time. Oriented to room. Call light within reach.

## 2016-05-22 NOTE — ED Provider Notes (Addendum)
6:15 PM seen by me. History is obtained from patient and from daughter and wife patient has complained of severe back pain with any sort of movement for several weeks. He's also had diminished appetite for the past several weeks. And had transient loss of vision earlier today patient states presently "I feel pretty good" on exam patient alert and in no distress, chronically ill-appearing lungs clear auscultation heart regular rate and rhythm abdomen nondistended nontender. Daughter reports that patient fell in April 2017. No falls since. Dr. Ronnald Ramp from neurosurgery was consulted. He requests the patient be admitted to the medical surgical service. He's concerned the patient has remote discitis. Patient will need CRP and sedimentation rate as well as TLSO splint ordered by me. Of also ordered CT head in light of transient loss of vision. ED ECG REPORT   Date: 05/22/2016  Rate: 70  Rhythm: normal sinus rhythm  QRS Axis: normal  Intervals: normal  ST/T Wave abnormalities: nonspecific T wave changes  Conduction Disutrbances:none  Narrative Interpretation:   Old EKG Reviewed: No significant change from 11/07/2015  I have personally reviewed the EKG tracing and agree with the computerized printout as noted. No significant changes noted when compared with prior ECG. Results for orders placed or performed during the hospital encounter of [id number]  Basic metabolic panel  Result Value Ref Range   Sodium 131 (L) 135 - 145 mmol/L   Potassium 4.1 3.5 - 5.1 mmol/L   Chloride 97 (L) 101 - 111 mmol/L   CO2 25 22 - 32 mmol/L   Glucose, Bld 158 (H) 65 - 99 mg/dL   BUN 18 6 - 20 mg/dL   Creatinine, Ser 1.17 0.61 - 1.24 mg/dL   Calcium 8.6 (L) 8.9 - 10.3 mg/dL   GFR calc non Af Amer 53 (L) >60 mL/min   GFR calc Af Amer >60 >60 mL/min   Anion gap 9 5 - 15  CBC with Differential  Result Value Ref Range   WBC 12.6 (H) 4.0 - 10.5 K/uL   RBC 3.37 (L) 4.22 - 5.81 MIL/uL   Hemoglobin 10.1 (L) 13.0 - 17.0 g/dL    HCT 31.2 (L) 39.0 - 52.0 %   MCV 92.6 78.0 - 100.0 fL   MCH 30.0 26.0 - 34.0 pg   MCHC 32.4 30.0 - 36.0 g/dL   RDW 14.6 11.5 - 15.5 %   Platelets 256 150 - 400 K/uL   Neutrophils Relative % 84 %   Neutro Abs 10.6 (H) 1.7 - 7.7 K/uL   Lymphocytes Relative 10 %   Lymphs Abs 1.2 0.7 - 4.0 K/uL   Monocytes Relative 6 %   Monocytes Absolute 0.7 0.1 - 1.0 K/uL   Eosinophils Relative 0 %   Eosinophils Absolute 0.0 0.0 - 0.7 K/uL   Basophils Relative 0 %   Basophils Absolute 0.0 0.0 - 0.1 K/uL  Urinalysis, Routine w reflex microscopic  Result Value Ref Range   Color, Urine YELLOW YELLOW   APPearance CLEAR CLEAR   Specific Gravity, Urine 1.016 1.005 - 1.030   pH 7.0 5.0 - 8.0   Glucose, UA NEGATIVE NEGATIVE mg/dL   Hgb urine dipstick NEGATIVE NEGATIVE   Bilirubin Urine NEGATIVE NEGATIVE   Ketones, ur NEGATIVE NEGATIVE mg/dL   Protein, ur NEGATIVE NEGATIVE mg/dL   Nitrite NEGATIVE NEGATIVE   Leukocytes, UA NEGATIVE NEGATIVE  Protime-INR  Result Value Ref Range   Prothrombin Time 32.0 (H) 11.6 - 15.2 seconds   INR 3.18 (H) 0.00 - 1.49  C-reactive protein  Result Value Ref Range   CRP 17.1 (H) <1.0 mg/dL  Sedimentation rate  Result Value Ref Range   Sed Rate 140 (H) 0 - 16 mm/hr   Ct Head Wo Contrast  05/22/2016  CLINICAL DATA:  Back pain, leg pain EXAM: CT HEAD WITHOUT CONTRAST TECHNIQUE: Contiguous axial images were obtained from the base of the skull through the vertex without intravenous contrast. COMPARISON:  09/21/2011 FINDINGS: Brain: No evidence of acute infarction, hemorrhage, extra-axial collection, ventriculomegaly, or mass effect. There is an area of encephalomalacia in the right parietal lobe. 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 Generalized cerebral atrophy. Periventricular white matter low attenuation likely secondary to microangiopathy. Vascular: Cerebrovascular atherosclerotic calcifications are noted. Skull: Negative for fracture or focal  lesion. Sinuses/Orbits: Visualized portions of the orbits are unremarkable. Visualized portions of the paranasal sinuses and mastoid air cells are unremarkable. Other: None. IMPRESSION: 1. No acute intracranial pathology. 2. Chronic microvascular disease and cerebral atrophy. Electronically Signed   By: Kathreen Devoid   On: 05/22/2016 21:17   Ct Lumbar Spine Wo Contrast  05/22/2016  CLINICAL DATA:  Pain post fall EXAM: CT LUMBAR SPINE WITHOUT CONTRAST TECHNIQUE: Multidetector CT imaging of the lumbar spine was performed without intravenous contrast administration. Multiplanar CT image reconstructions were also generated. COMPARISON:  CT 10/26/2007 FINDINGS: Five non rib bearing lobar is segments labeled L1-L5. T11-12: Bridging anterior endplate osteophytes. Osteopenia. Central canal and foramina patent. T12-L1: Bridging anterior osteophytes. Central canal and foramina patent. L1-2: Compression fracture deformity involving inferior endplate of L1 with a estimated 50% loss of height, more severe right than left. Additionally, there is burst fracture of L2 with greater than 50% loss of height anteriorly left greater than right. Fracture at L2 extends to involve the pedicles bilaterally, minimally displaced. Approximately 4 mm retropulsion at the level of the superior endplate of L2. There is a small amount of gas in the interspace extending up into the fractured component of L1. No unexpected soft tissue component. L2-3:  Mild disc bulge.  Mild bilateral facet DJD. L3-4:  Disc bulge.  Bilateral facet DJD.  Probable spinal stenosis. L4-5: Disc narrowing. Disc bulge. Grade 1 anterolisthesis probably secondary to the moderate facet DJD. Probable spinal stenosis. L5-S1:  Asymmetric disc narrowing.  Mild bulge.  Facet DJD. Fusion across bilateral sacroiliac joints. Patchy aortoiliac arterial calcifications without aneurysm. Cardiac pacing leads partially visualized. IMPRESSION: 1. Compression fracture of L1 and burst  fracture of L2, as detailed above, probably posttraumatic. No other findings to suggest discitis or metastatic involvement. Findings were reviewed with Dr. [place] Crane, who concurs. 2. Spondylitic changes L2 to S1, with probable spinal stenosis L3-4 and L4-5. Electronically Signed   By: Lucrezia Europe M.D.   On: 05/22/2016 18:05   Ct Hip Right Wo Contrast  05/22/2016  CLINICAL DATA:  Fall today. Severe lower back pain and right hip pain EXAM: CT OF THE RIGHT HIP WITHOUT CONTRAST TECHNIQUE: Multidetector CT imaging of the right hip was performed according to the standard protocol. Multiplanar CT image reconstructions were also generated. COMPARISON:  03/16/2016 FINDINGS: Negative for fracture no significant effusion. Minor spurring from the femoral head and superior acetabulum. Right superior and inferior ischiopubic rami intact. Fusion across the right SI joint. Patchy iliac arterial calcifications without aneurysm. Urinary bladder is distended. IMPRESSION: 1. Negative.  No hip fracture. Electronically Signed   By: Lucrezia Europe M.D.   On: 05/22/2016 18:08    Dr.Stinson from hospitalist service consulted and will  see patient in the emergency department. In light of elevated sedimentation rate and CRP will treat empirically for discitis, with intravenous vancomycin and Rocephin. Patient will be admitted to medical surgical floor Orlie Dakin, MD 05/22/16 [place], MD 05/22/16 2156  Orlie Dakin, MD 05/22/16 2209

## 2016-05-22 NOTE — Progress Notes (Signed)
ANTICOAGULATION CONSULT NOTE - Initial Consult  Pharmacy Consult for Warfarin  Indication: atrial fibrillation  Allergies  Allergen Reactions  . Guaifenesin Er Palpitations    Pt can not take mucinex PM  . Prednisone Other (See Comments)    HIGH doses make patient feel "crazy"    Patient Measurements: Height: 5\' 8"  (172.7 cm) Weight: 150 lb 9.2 oz (68.3 kg) IBW/kg (Calculated) : 68.4  Vital Signs: Temp: 99.8 F (37.7 C) (06/22 2321) Temp Source: Oral (06/22 2321) BP: 139/73 mmHg (06/22 2321) Pulse Rate: 84 (06/22 2321)  Labs:  Recent Labs  05/22/16 1551  HGB 10.1*  HCT 31.2*  PLT 256  LABPROT 32.0*  INR 3.18*  CREATININE 1.17    Estimated Creatinine Clearance: 41.3 mL/min (by C-G formula based on Cr of 1.17).   Medical History: Past Medical History  Diagnosis Date  . Atrial fibrillation (HCC)     coumadin Rx  . Sick sinus syndrome (HCC)     s/p PPM  . Ataxia     followed by Dr. Erling Cruz  . Hyperlipidemia   . Hypothyroidism   . BPH (benign prostatic hyperplasia)   . Hypertension   . TIA (transient ischemic attack)     carotid dopplers 5/11: 0-39% bilateral; follow up due 03/2012  . Peripheral neuropathy (HCC)    Assessment: Continuing PTA warfarin for afib, INR is slightly elevated at 3.18, PTA dose per outpatient anti-coag notes is 5 mg on Mon/Wed/Fri and 2.5 mg all other days  Goal of Therapy:  INR 2-3 Monitor platelets by anticoagulation protocol: Yes   Plan:  -INR with AM labs to see if trending up/down  Narda Bonds 05/22/2016,11:38 PM

## 2016-05-22 NOTE — H&P (Signed)
History and Physical  [name] EYC:[id number] DOB: 03-Apr-1924 DOA: 05/22/2016  Referring physician: Dr Winfred Leeds, ED physician PCP: Merrilee Seashore, MD  Outpatient Specialists:   Dr Rayann Heman (EP)  Dr Leta Baptist (Neuro)  Chief Complaint: Back Pain  HPI: [name] is a 82 y.o. male with a history of Atrial fibrillation rhythm controlled on amiodarone and on anticoagulation (CHADS 2 VASC score 5), history of TIA pacemaker in place, hypothyroidism, BPH, hypertension. Patient was admitted [facility] due to a fall and right hip pain. Patient was admitted due to continued pain and was eventually discharged to a nursing home for rehabilitation.  After being released from hospital, the patient had intractable back pain.  Approximately 1 week after being discharged from the hospital, his primary care physician ordered some blood tests, which showed an elevated CRP and ESR. The patient was given a diagnosis of polymyalgia rheumatica, however the patient continued to have severe and intractable back pain despite Steroids, physical therapy and pain medicines. Additionally, the patient's mental capacity has declined remarkably since the diagnosis.  He is currently bedbound, and is unable to perform ADLs. There have been no fevers or chills. Patient is not able to participate in history, although is able to follow commands. Much of his speech is garbled and is without context. The history is obtained from his daughter and his wife.   Review of Systems:   Unable to obtain accurately due to patient's altered mental status  Past Medical History  Diagnosis Date  . Atrial fibrillation (HCC)     coumadin Rx  . Sick sinus syndrome (HCC)     s/p PPM  . Ataxia     followed by Dr. Erling Cruz  . Hyperlipidemia   . Hypothyroidism   . BPH (benign prostatic hyperplasia)   . Hypertension   . TIA (transient ischemic attack)     carotid dopplers 5/11: 0-39% bilateral; follow up due 03/2012  .  Peripheral neuropathy [facility])    Past Surgical History  Procedure Laterality Date  . Pacemaker insertion      implanted 10/96, most recent Gen Change by Dr Olevia Perches 8/08   Social History:  reports that he quit smoking about 38 years ago. He has never used smokeless tobacco. He reports that he does not drink alcohol or use illicit drugs. Patient lives at [facility]  . Guaifenesin Er Palpitations    Pt can not take mucinex PM  . Prednisone Other (See Comments)    HIGH doses make patient feel "crazy"    Family History  Problem Relation Age of Onset  . Stroke Mother       Prior to Admission medications   Medication Sig Start Date End Date Taking? Authorizing Provider  acetaminophen (TYLENOL) 500 MG tablet Take 500 mg by mouth every 6 (six) hours as needed for moderate pain.   Yes Historical Provider, MD  amiodarone (PACERONE) 200 MG tablet Take 0.5 tablets (100 mg total) by mouth daily. 05/20/16  Yes Thompson Grayer, MD  levothyroxine (SYNTHROID, LEVOTHROID) 25 MCG tablet Take 25 mcg by mouth daily before breakfast.   Yes Historical Provider, MD  Multiple Vitamin (MULTIVITAMIN) tablet Take 1 tablet by mouth daily.     Yes Historical Provider, MD  Nutritional Supplements (FIBER FORMULA) POWD Take 1 packet by mouth daily as needed (bowels).    Yes Historical Provider, MD  simvastatin (ZOCOR) 20 MG tablet Take 20 mg by mouth at bedtime.     Yes Historical Provider, MD  warfarin (COUMADIN) 5 MG tablet Take 1 tablet (5 mg total) by mouth as directed. Patient taking differently: Take 2.5-5 mg by mouth as directed. Take 2.5 mg (0.5 tablet) on Tues, Thurs, Sat and Sun and Take 5 mg (1 tablet) on Mon, Wed and Fri 10/29/15  Yes Thompson Grayer, MD  HYDROcodone-acetaminophen (NORCO/VICODIN) 5-325 MG tablet Take 1 tablet by mouth every 6 (six) hours as needed for severe pain. 03/18/16   Silver Huguenin Elgergawy, MD  tamsulosin (FLOMAX) 0.4 MG CAPS capsule Take 1 capsule (0.4 mg total) by  mouth daily. 03/18/16   Albertine Patricia, MD    Physical Exam: BP 123/64 mmHg  Pulse 71  Temp(Src) 97.3 F (36.3 C) (Oral)  Resp 20  SpO2 94%  General: Elderly Caucasian male. Awake and alert.  Oriented to person. No acute cardiopulmonary distress.  HEENT: Normocephalic atraumatic.  Right and left ears normal in appearance.  Pupils equal, round, reactive to light. Extraocular muscles are intact. Sclerae anicteric and noninjected.  Moist mucosal membranes. No mucosal lesions.  Neck: Neck supple without lymphadenopathy. No carotid bruits. No masses palpated.  Cardiovascular: Regular rate with normal S1-S2 sounds. No murmurs, rubs, gallops auscultated. No JVD.  Respiratory: Good respiratory effort with no wheezes, rales, rhonchi. Lungs clear to auscultation bilaterally.  No accessory muscle use. Abdomen: Soft, nontender, nondistended. Active bowel sounds. No masses or hepatosplenomegaly  Skin: No rashes, lesions, or ulcerations.  Dry, warm to touch. 2+ dorsalis pedis and radial pulses.  Approximately 3 cm Erythemic area on the right sacral area - No skin breakdown. Musculoskeletal: No calf or leg pain. All major joints not erythematous nontender.  No upper or lower joint deformation. Pain with movement. Pain with palpation over the lower lumbar spine. No contractures  Psychiatric:  Pleasant and cooperative. Neurologic: No focal neurological deficits. Strength is 5/5 and symmetric in upper and lower extremities.  Cranial nerves II through XII are grossly intact.           Labs on Admission: I have personally reviewed following labs and imaging studies  CBC:  Recent Labs Lab 05/22/16 1551  WBC 12.6*  NEUTROABS 10.6*  HGB 10.1*  HCT 31.2*  MCV 92.6  PLT 209   Basic Metabolic Panel:  Recent Labs Lab 05/22/16 1551  NA 131*  K 4.1  CL 97*  CO2 25  GLUCOSE 158*  BUN 18  CREATININE 1.17  CALCIUM 8.6*   GFR: CrCl cannot be calculated (Unknown ideal weight.). Liver Function  Tests: No results for input(s): AST, ALT, ALKPHOS, BILITOT, PROT, ALBUMIN in the last 168 hours. No results for input(s): LIPASE, AMYLASE in the last 168 hours. No results for input(s): AMMONIA in the last 168 hours. Coagulation Profile:  Recent Labs Lab 05/16/16 05/22/16 1551  INR 3.2 3.18*   Cardiac Enzymes: No results for input(s): CKTOTAL, CKMB, CKMBINDEX, TROPONINI in the last 168 hours. BNP (last 3 results) No results for input(s): PROBNP in the last 8760 hours. HbA1C: No results for input(s): HGBA1C in the last 72 hours. CBG: No results for input(s): GLUCAP in the last 168 hours. Lipid Profile: No results for input(s): CHOL, HDL, LDLCALC, TRIG, CHOLHDL, LDLDIRECT in the last 72 hours. Thyroid Function Tests: No results for input(s): TSH, T4TOTAL, FREET4, T3FREE, THYROIDAB in the last 72 hours. Anemia Panel: No results for input(s): VITAMINB12, FOLATE, FERRITIN, TIBC, IRON, RETICCTPCT in the last 72 hours. Urine analysis:    Component Value Date/Time   COLORURINE YELLOW 05/22/2016 [place] 05/22/2016  1754   LABSPEC 1.016 05/22/2016 1754   PHURINE 7.0 05/22/2016 1754   GLUCOSEU NEGATIVE 05/22/2016 [place] 09/28/2008 1243   HGBUR NEGATIVE 05/22/2016 1754   BILIRUBINUR NEGATIVE 05/22/2016 1754   KETONESUR NEGATIVE 05/22/2016 1754   PROTEINUR NEGATIVE 05/22/2016 1754   UROBILINOGEN 0.2 mg/dL 09/28/2008 1243   NITRITE NEGATIVE 05/22/2016 1754   LEUKOCYTESUR NEGATIVE 05/22/2016 1754   Sepsis Labs: '@LABRCNTIP'$ (procalcitonin:4,lacticidven:4) )No results found for this or any previous visit (from the past 240 hour(s)).   Radiological Exams on Admission: Ct Head Wo Contrast  05/22/2016  CLINICAL DATA:  Back pain, leg pain EXAM: CT HEAD WITHOUT CONTRAST TECHNIQUE: Contiguous axial images were obtained from the base of the skull through the vertex without intravenous contrast. COMPARISON:  09/21/2011 FINDINGS: Brain: No evidence of acute  infarction, hemorrhage, extra-axial collection, ventriculomegaly, or mass effect. There is an area of encephalomalacia in the right parietal lobe. 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 Generalized cerebral atrophy. Periventricular white matter low attenuation likely secondary to microangiopathy. Vascular: Cerebrovascular atherosclerotic calcifications are noted. Skull: Negative for fracture or focal lesion. Sinuses/Orbits: Visualized portions of the orbits are unremarkable. Visualized portions of the paranasal sinuses and mastoid air cells are unremarkable. Other: None. IMPRESSION: 1. No acute intracranial pathology. 2. Chronic microvascular disease and cerebral atrophy. Electronically Signed   By: Kathreen Devoid   On: 05/22/2016 21:17   Ct Lumbar Spine Wo Contrast  05/22/2016  CLINICAL DATA:  Pain post fall EXAM: CT LUMBAR SPINE WITHOUT CONTRAST TECHNIQUE: Multidetector CT imaging of the lumbar spine was performed without intravenous contrast administration. Multiplanar CT image reconstructions were also generated. COMPARISON:  CT 10/26/2007 FINDINGS: Five non rib bearing lobar is segments labeled L1-L5. T11-12: Bridging anterior endplate osteophytes. Osteopenia. Central canal and foramina patent. T12-L1: Bridging anterior osteophytes. Central canal and foramina patent. L1-2: Compression fracture deformity involving inferior endplate of L1 with a estimated 50% loss of height, more severe right than left. Additionally, there is burst fracture of L2 with greater than 50% loss of height anteriorly left greater than right. Fracture at L2 extends to involve the pedicles bilaterally, minimally displaced. Approximately 4 mm retropulsion at the level of the superior endplate of L2. There is a small amount of gas in the interspace extending up into the fractured component of L1. No unexpected soft tissue component. L2-3:  Mild disc bulge.  Mild bilateral facet DJD. L3-4:  Disc bulge.  Bilateral  facet DJD.  Probable spinal stenosis. L4-5: Disc narrowing. Disc bulge. Grade 1 anterolisthesis probably secondary to the moderate facet DJD. Probable spinal stenosis. L5-S1:  Asymmetric disc narrowing.  Mild bulge.  Facet DJD. Fusion across bilateral sacroiliac joints. Patchy aortoiliac arterial calcifications without aneurysm. Cardiac pacing leads partially visualized. IMPRESSION: 1. Compression fracture of L1 and burst fracture of L2, as detailed above, probably posttraumatic. No other findings to suggest discitis or metastatic involvement. Findings were reviewed with Dr. [place] Crane, who concurs. 2. Spondylitic changes L2 to S1, with probable spinal stenosis L3-4 and L4-5. Electronically Signed   By: Lucrezia Europe M.D.   On: 05/22/2016 18:05   Ct Hip Right Wo Contrast  05/22/2016  CLINICAL DATA:  Fall today. Severe lower back pain and right hip pain EXAM: CT OF THE RIGHT HIP WITHOUT CONTRAST TECHNIQUE: Multidetector CT imaging of the right hip was performed according to the standard protocol. Multiplanar CT image reconstructions were also generated. COMPARISON:  03/16/2016 FINDINGS: Negative for fracture no significant effusion. Minor spurring from the  femoral head and superior acetabulum. Right superior and inferior ischiopubic rami intact. Fusion across the right SI joint. Patchy iliac arterial calcifications without aneurysm. Urinary bladder is distended. IMPRESSION: 1. Negative.  No hip fracture. Electronically Signed   By: Lucrezia Europe M.D.   On: 05/22/2016 18:08    Assessment/Plan: Principal Problem:   Compression fracture of lumbar spine, non-traumatic Active Problems:   Hypothyroidism   HYPERTENSION, BENIGN ESSENTIAL   ATRIAL FIBRILLATION   PACEMAKER, PERMANENT   Elevated C-reactive protein (CRP)   Altered mental status    This patient was discussed with the ED physician, including pertinent vitals, physical exam findings, labs, and imaging.  We also discussed care given by the ED provider.  #1  compression fracture of lumbar spine - L1 and L2  Admit for pain control  TLSO brace  Neurosurgery following - i appreciate their input #2 elevated CRP  His inflammatory markers are elevated, as well as his white blood cell count.  Due to his pacemaker, patient is unable to have an MRI to rule out discitis  Although the patient was diagnosed with polymyalgia rheumatica, I'm not sure that this is a reliable diagnosis given that this was diagnosed a week or so after his back pain started.  Further, steroids was unhelpful for his pain.  With his white blood cell count, sedimentation rate, CRP elevated, I think it is prudent to start the patient on antibiotics for discitis  Start vancomycin and ceftriaxone  Blood cultures  Recheck CBC, CRP, ESR tomorrow morning #3 altered mental status  I'm unsure how much of this is related to infection versus other  Watch for improvement #4 atrial fibrillation  Continue amiodarone  Continue Coumadin  Pharmacy consult for Coumadin dosing #5 hypertension  Continue home medications #6 hypothyroidism  Continue home medications #7 permanent pacemaker   DVT prophylaxis: On warfarin Consultants: Neurosurgery Code Status: full code Family Communication: Wife and daughter  Disposition Plan: Admit to [place], DO [facility] Pager [phone]  If 7PM-7AM, please contact night-coverage [email or web address] Password [email or web address]

## 2016-05-22 NOTE — ED Provider Notes (Signed)
CSN: [id number]     Arrival date & time 05/22/16  1502 History   First MD Initiated Contact with Patient 05/22/16 1521     Chief Complaint  Patient presents with  . Fall     (Consider location/radiation/quality/duration/timing/severity/associated sxs/prior Treatment) HPI Comments: Patient is an 82 year old male with past medical history of atrial fibrillation, hypertension, prior TIA. He was recently admitted to [facility] long hospital after a fall with intractable back pain. He was there for 2 days, then discharged to a rehabilitation facility for 2 weeks. He apparently did not enjoy his time care and the family moved him to a different nursing home. He returned home less than one week ago and is continuing to have severe pain in his back that precludes him from ambulating and performing his activities of daily living. Anytime he moves, he reports severe pain. He denies any numbness, tingling, or weakness in his legs. The family is also reporting "dark urine" and are concerned about the possibility of an infection.  The history is provided by the patient.    Past Medical History  Diagnosis Date  . Atrial fibrillation (HCC)     coumadin Rx  . Sick sinus syndrome (HCC)     s/p PPM  . Ataxia     followed by Dr. Erling Cruz  . Hyperlipidemia   . Hypothyroidism   . BPH (benign prostatic hyperplasia)   . Hypertension   . TIA (transient ischemic attack)     carotid dopplers 5/11: 0-39% bilateral; follow up due 03/2012  . Peripheral neuropathy [facility])    Past Surgical History  Procedure Laterality Date  . Pacemaker insertion      implanted 10/96, most recent Gen Change by Dr Olevia Perches 8/08   Family History  Problem Relation Age of Onset  . Stroke Mother    Social History  Substance Use Topics  . Smoking status: Former Smoker    Quit date: 12/01/1977  . Smokeless tobacco: Never Used  . Alcohol Use: No     Comment: quit in 1995    Review of Systems  All other systems reviewed and are  negative.     Allergies  Guaifenesin er and Prednisone  Home Medications   Prior to Admission medications   Medication Sig Start Date End Date Taking? Authorizing Provider  acetaminophen (TYLENOL) 500 MG tablet Take 500 mg by mouth every 6 (six) hours as needed for moderate pain.    Historical Provider, MD  ALPRAZolam (XANAX XR) 0.5 MG 24 hr tablet Take 0.5 mg by mouth as needed for anxiety.    Historical Provider, MD  amiodarone (PACERONE) 200 MG tablet Take 0.5 tablets (100 mg total) by mouth daily. 05/20/16   Thompson Grayer, MD  docusate sodium (COLACE) 100 MG capsule Take 100 mg by mouth daily as needed for mild constipation.     Historical Provider, MD  HYDROcodone-acetaminophen (NORCO/VICODIN) 5-325 MG tablet Take 1 tablet by mouth every 6 (six) hours as needed for severe pain. 03/18/16   Silver Huguenin Elgergawy, MD  levothyroxine (SYNTHROID, LEVOTHROID) 25 MCG tablet Take 25 mcg by mouth daily before breakfast.    Historical Provider, MD  LORazepam (ATIVAN) 1 MG tablet Take 0.5 mg by mouth daily as needed. Irregular heartbeat/ anxiety    Historical Provider, MD  Multiple Vitamin (MULTIVITAMIN) tablet Take 1 tablet by mouth daily.      Historical Provider, MD  Nutritional Supplements (FIBER FORMULA) POWD Take 1 packet by mouth daily as needed (bowels).  Historical Provider, MD  predniSONE (DELTASONE) 10 MG tablet Take 10 mg by mouth daily with breakfast. Take 1 tablet three times a day.    Historical Provider, MD  simvastatin (ZOCOR) 20 MG tablet Take 20 mg by mouth at bedtime.      Historical Provider, MD  tamsulosin (FLOMAX) 0.4 MG CAPS capsule Take 1 capsule (0.4 mg total) by mouth daily. 03/18/16   Silver Huguenin Elgergawy, MD  terbinafine (LAMISIL) 250 MG tablet Take 250 mg by mouth daily.    Historical Provider, MD  tizanidine (ZANAFLEX) 2 MG capsule Take 2 mg by mouth 3 (three) times daily as needed for muscle spasms.    Historical Provider, MD  warfarin (COUMADIN) 5 MG tablet Take 1 tablet  (5 mg total) by mouth as directed. Patient taking differently: Take 2.5-5 mg by mouth as directed. Take 2.5 mg (0.5 tablet) on Tues, Thurs, Sat and Sun and Take 5 mg (1 tablet) on Mon, Wed and Fri 10/29/15   Thompson Grayer, MD  zolpidem (AMBIEN) 10 MG tablet Take 5 mg by mouth at bedtime as needed for sleep. sleep    Historical Provider, MD   BP 136/71 mmHg  Pulse 77  Temp(Src) 97.3 F (36.3 C) (Oral)  Resp 16  SpO2 98% Physical Exam  Constitutional: He is oriented to person, place, and time. He appears well-developed and well-nourished. No distress.  HENT:  Head: Normocephalic and atraumatic.  Neck: Normal range of motion. Neck supple.  Cardiovascular: Normal rate, regular rhythm and normal heart sounds.   No murmur heard. Pulmonary/Chest: Effort normal and breath sounds normal. No respiratory distress. He has no wheezes. He has no rales.  Abdominal: Soft. Bowel sounds are normal. He exhibits no distension. There is no tenderness.  Musculoskeletal:  Patient complains of severe pain with any sitting forward or backward.  Neurological: He is alert and oriented to person, place, and time.  Skin: Skin is warm and dry. He is not diaphoretic.  Nursing note and vitals reviewed.   ED Course  Procedures (including critical care time) Labs Review Labs Reviewed  BASIC METABOLIC PANEL  CBC WITH DIFFERENTIAL/PLATELET  URINALYSIS, ROUTINE W REFLEX MICROSCOPIC (NOT AT [facility])  PROTIME-INR    Imaging Review No results found. I have personally reviewed and evaluated these images and lab results as part of my medical decision-making.   EKG Interpretation None      MDM   Final diagnoses:  None    Patient is an 82 year old male recently admitted to rehabilitation with complaints of right hip pain after a fall. Imaging studies were all negative. Patient was in 2 separate facilities over the past 2 months and returned approximately 5 days ago. Over the past 2 weeks, he has had increasing  pain in his low back. He cannot sit or lie flat without severe pain.  Workup was initiated including laboratory studies and CT scans of the back and hip. Laboratory studies reveal an elevated white count, however no other significant abnormality. CT scans of the lumbar spine are pending. Care will be signed out to Dr. Winfred Leeds at shift change awaiting radiology interpretation. I reviewed the scans and suspect there is a compression fracture, however this will be confirmed and the final disposition will be made.    Veryl Speak, MD 05/23/16 [phone]

## 2016-05-22 NOTE — ED Notes (Signed)
Ortho paged. 

## 2016-05-22 NOTE — ED Notes (Signed)
Ortho tech paged  

## 2016-05-22 NOTE — Progress Notes (Signed)
Pharmacy Antibiotic Note  [name] is a 82 y.o. male admitted on 05/22/2016 with possible discitis.  Pharmacy has been consulted for vancomycin dosing. Pt is afebrile and WBC is elevated at 12.6. Scr is 1.18.   Plan: - Vanc 1500mg  IV x 1 then 750mg  IV Q12H - F/u renal fxn, C&S, clinical status and trough at SS     Temp (24hrs), Avg:97.3 F (36.3 C), Min:97.3 F (36.3 C), Max:97.3 F (36.3 C)   Recent Labs Lab 05/22/16 1551  WBC 12.6*  CREATININE 1.17    CrCl cannot be calculated (Unknown ideal weight.).    Allergies  Allergen Reactions  . Guaifenesin Er Palpitations    Pt can not take mucinex PM  . Prednisone Other (See Comments)    HIGH doses make patient feel "crazy"    Antimicrobials this admission: Vanc 6/22>> CTX 6/22>>  Dose adjustments this admission: N/A  Microbiology results: Pending  Thank you for allowing pharmacy to be a part of this patient's care.  [name], [name] 05/22/2016 10:15 PM

## 2016-05-22 NOTE — ED Notes (Signed)
MD Stinson at bedside.  Per MD patient has half dollar sized reddened area to the sacral region.  All skin intact.  Will continue to monitor.

## 2016-05-22 NOTE — Progress Notes (Signed)
Patient ID: [name], male   DOB: Mar 01, 1924, 82 y.o.   MRN: [id number] I was asked to review the imaging on this 82 year old male who has apparently been in a nursing home complaining of progressive back pain with radiation into the hips. CT scan in the ER tonight suggested L1 and L2 fractures. The patient has become progressively bedbound or wheelchair-bound secondary to pain over the last few weeks.  CT scan shows what appears to be fracture of the inferior endplate of L1 and superior endplate of L2 with either osteolysis or destruction of the bone. There is mild retropulsion but no obvious significant canal narrowing. The fracture seems to be around the disc space itself. It may involve the pedicles of L2.  I suspect this is chronic fractures of L1 and L2 with osteo lysis in an elderly gentleman with poor bone quality, but I cannot rule out discitis with destruction of the endplates entirely. Therefore I would recommend a sedimentation rate and a CRP. If these are low, that would suggest that this is a chronic fracture. If these are elevated, then that may suggest this could potentially be discitis unless there are other reasons he would have elevated sedimentation rate and CRP.  I would recommend a TLSO brace that can be donned when sitting up and should be worn when out of bed to help stabilize the situation and potentially control pain and improve function.  Obviously given his advanced age and poor bone quality he would not be a candidate for a multilevel instrumented fusion to address these fractures. This may would require instrumentation from T10-L5. I do not believe he would tolerate an 8 level fusion very well. Therefore the mainstay treatment will be pain control and bracing and modification of activities. Please let me know if I can be of assistance.

## 2016-05-22 NOTE — ED Notes (Signed)
Ortho said TLSO brace will be done but may happen on the floor when pt is admitted.

## 2016-05-23 ENCOUNTER — Inpatient Hospital Stay (HOSPITAL_COMMUNITY): Payer: Medicare Other

## 2016-05-23 ENCOUNTER — Other Ambulatory Visit: Payer: Self-pay

## 2016-05-23 DIAGNOSIS — S32020G Wedge compression fracture of second lumbar vertebra, subsequent encounter for fracture with delayed healing: Secondary | ICD-10-CM

## 2016-05-23 DIAGNOSIS — R7982 Elevated C-reactive protein (CRP): Secondary | ICD-10-CM

## 2016-05-23 DIAGNOSIS — I10 Essential (primary) hypertension: Secondary | ICD-10-CM

## 2016-05-23 LAB — BLOOD CULTURE ID PANEL (REFLEXED)
Acinetobacter baumannii: NOT DETECTED
CANDIDA PARAPSILOSIS: NOT DETECTED
CANDIDA TROPICALIS: NOT DETECTED
CARBAPENEM RESISTANCE: NOT DETECTED
Candida albicans: NOT DETECTED
Candida glabrata: NOT DETECTED
Candida krusei: NOT DETECTED
ENTEROBACTERIACEAE SPECIES: NOT DETECTED
ENTEROCOCCUS SPECIES: DETECTED — AB
Enterobacter cloacae complex: NOT DETECTED
Escherichia coli: NOT DETECTED
Haemophilus influenzae: NOT DETECTED
KLEBSIELLA PNEUMONIAE: NOT DETECTED
Klebsiella oxytoca: NOT DETECTED
LISTERIA MONOCYTOGENES: NOT DETECTED
Methicillin resistance: NOT DETECTED
Neisseria meningitidis: NOT DETECTED
PROTEUS SPECIES: NOT DETECTED
PSEUDOMONAS AERUGINOSA: NOT DETECTED
SERRATIA MARCESCENS: NOT DETECTED
STAPHYLOCOCCUS AUREUS BCID: NOT DETECTED
STAPHYLOCOCCUS SPECIES: NOT DETECTED
Streptococcus agalactiae: NOT DETECTED
Streptococcus pneumoniae: NOT DETECTED
Streptococcus pyogenes: NOT DETECTED
Streptococcus species: NOT DETECTED
VANCOMYCIN RESISTANCE: NOT DETECTED

## 2016-05-23 LAB — BASIC METABOLIC PANEL
Anion gap: 5 (ref 5–15)
BUN: 16 mg/dL (ref 6–20)
CHLORIDE: 104 mmol/L (ref 101–111)
CO2: 26 mmol/L (ref 22–32)
CREATININE: 1.16 mg/dL (ref 0.61–1.24)
Calcium: 8 mg/dL — ABNORMAL LOW (ref 8.9–10.3)
GFR calc Af Amer: >60 mL/min (ref >60)
GFR calc non Af Amer: 54 mL/min — ABNORMAL LOW (ref >60)
Glucose, Bld: 121 mg/dL — ABNORMAL HIGH (ref 65–99)
Potassium: 4.8 mmol/L (ref 3.5–5.1)
SODIUM: 135 mmol/L (ref 135–145)

## 2016-05-23 LAB — C-REACTIVE PROTEIN: CRP: 16.1 mg/dL — ABNORMAL HIGH (ref <1.0)

## 2016-05-23 LAB — CBC
HCT: 27.3 % — ABNORMAL LOW (ref 39.0–52.0)
HEMOGLOBIN: 8.5 g/dL — AB (ref 13.0–17.0)
MCH: 29.3 pg (ref 26.0–34.0)
MCHC: 31.1 g/dL (ref 30.0–36.0)
MCV: 94.1 fL (ref 78.0–100.0)
PLATELETS: 225 10*3/uL (ref 150–400)
RBC: 2.9 MIL/uL — ABNORMAL LOW (ref 4.22–5.81)
RDW: 14.6 % (ref 11.5–15.5)
WBC: 9 10*3/uL (ref 4.0–10.5)

## 2016-05-23 LAB — PROTIME-INR
INR: 4.22 — ABNORMAL HIGH (ref 0.00–1.49)
PROTHROMBIN TIME: 39.5 s — AB (ref 11.6–15.2)

## 2016-05-23 LAB — VITAMIN B12: VITAMIN B 12: 644 pg/mL (ref 180–914)

## 2016-05-23 LAB — SEDIMENTATION RATE: Sed Rate: >140 mm/hr — ABNORMAL HIGH (ref 0–16)

## 2016-05-23 LAB — TSH: TSH: 4.299 u[IU]/mL (ref 0.350–4.500)

## 2016-05-23 LAB — MRSA PCR SCREENING: MRSA BY PCR: NEGATIVE

## 2016-05-23 LAB — AMMONIA: Ammonia: 22 umol/L (ref 9–35)

## 2016-05-23 MED ORDER — VANCOMYCIN HCL IN DEXTROSE 750-5 MG/150ML-% IV SOLN
750.0000 mg | Freq: Two times a day (BID) | INTRAVENOUS | Status: DC
  Administered 2016-05-24 – 2016-05-26 (×5): 750 mg via INTRAVENOUS
  Filled 2016-05-23 (×5): qty 150

## 2016-05-23 MED ORDER — VANCOMYCIN HCL IN DEXTROSE 750-5 MG/150ML-% IV SOLN
750.0000 mg | Freq: Two times a day (BID) | INTRAVENOUS | Status: DC
  Filled 2016-05-23 (×2): qty 150

## 2016-05-23 MED ORDER — SODIUM CHLORIDE 0.9 % IV SOLN
INTRAVENOUS | Status: DC
  Filled 2016-05-23: qty 1000

## 2016-05-23 MED ORDER — VANCOMYCIN HCL IN DEXTROSE 750-5 MG/150ML-% IV SOLN
750.0000 mg | Freq: Once | INTRAVENOUS | Status: AC
  Administered 2016-05-23: 750 mg via INTRAVENOUS
  Filled 2016-05-23: qty 150

## 2016-05-23 NOTE — Care Management Important Message (Signed)
Important Message  Patient Details  Name: [name] MRN: [id number] Date of Birth: [birth year]   Medicare Important Message Given:  Yes    [name] 05/23/2016, 9:50 AM

## 2016-05-23 NOTE — Progress Notes (Signed)
PHARMACY - PHYSICIAN COMMUNICATION CRITICAL VALUE ALERT - BLOOD CULTURE IDENTIFICATION (BCID)  Results for orders placed or performed during the hospital encounter of 05/22/16  Blood Culture ID Panel (Reflexed) (Collected: 05/22/2016 10:18 PM)  Result Value Ref Range   Enterococcus species DETECTED (A) NOT DETECTED   Vancomycin resistance NOT DETECTED NOT DETECTED   Listeria monocytogenes NOT DETECTED NOT DETECTED   Staphylococcus species NOT DETECTED NOT DETECTED   Staphylococcus aureus NOT DETECTED NOT DETECTED   Methicillin resistance NOT DETECTED NOT DETECTED   Streptococcus species NOT DETECTED NOT DETECTED   Streptococcus agalactiae NOT DETECTED NOT DETECTED   Streptococcus pneumoniae NOT DETECTED NOT DETECTED   Streptococcus pyogenes NOT DETECTED NOT DETECTED   Acinetobacter baumannii NOT DETECTED NOT DETECTED   Enterobacteriaceae species NOT DETECTED NOT DETECTED   Enterobacter cloacae complex NOT DETECTED NOT DETECTED   Escherichia coli NOT DETECTED NOT DETECTED   Klebsiella oxytoca NOT DETECTED NOT DETECTED   Klebsiella pneumoniae NOT DETECTED NOT DETECTED   Proteus species NOT DETECTED NOT DETECTED   Serratia marcescens NOT DETECTED NOT DETECTED   Carbapenem resistance NOT DETECTED NOT DETECTED   Haemophilus influenzae NOT DETECTED NOT DETECTED   Neisseria meningitidis NOT DETECTED NOT DETECTED   Pseudomonas aeruginosa NOT DETECTED NOT DETECTED   Candida albicans NOT DETECTED NOT DETECTED   Candida glabrata NOT DETECTED NOT DETECTED   Candida krusei NOT DETECTED NOT DETECTED   Candida parapsilosis NOT DETECTED NOT DETECTED   Candida tropicalis NOT DETECTED NOT DETECTED    Name of physician (or Provider) Contacted: Dr. Carles Collet  Changes to prescribed antibiotics required:  Resume Vancomycin  Arty Baumgartner, [place] Pager: [phone] 05/23/2016  5:51 PM

## 2016-05-23 NOTE — Progress Notes (Signed)
ANTICOAGULATION CONSULT NOTE - F/U Consult  Pharmacy Consult for Warfarin  Indication: atrial fibrillation  Allergies  Allergen Reactions  . Guaifenesin Er Palpitations    Pt can not take mucinex PM  . Prednisone Other (See Comments)    HIGH doses make patient feel "crazy"    Patient Measurements: Height: 5\' 8"  (172.7 cm) Weight: 150 lb 9.2 oz (68.3 kg) IBW/kg (Calculated) : 68.4  Vital Signs: Temp: 98.2 F (36.8 C) (06/23 0949) Temp Source: Oral (06/23 0949) BP: 123/67 mmHg (06/23 0949) Pulse Rate: 65 (06/23 0949)  Labs:  Recent Labs  05/22/16 1551 05/23/16 0447  HGB 10.1* 8.5*  HCT 31.2* 27.3*  PLT 256 225  LABPROT 32.0* 39.5*  INR 3.18* 4.22*  CREATININE 1.17 1.16    Estimated Creatinine Clearance: 41.7 mL/min (by C-G formula based on Cr of 1.16).  Assessment: CC: back pain  PMH: afib, SSS, HLD, hypothyroid, BPH, HTN, TIA, neuropathy  AC: Warfarin PTA - INR 4.22 (holding warfarin for L2 biopsy Mon)  PTA warfarin 5 mg MWF, 2.5 mg AOD  Renal: SCr 1.16  Heme: H&H 8.5/27.3, Plt 225  Goal of Therapy:  INR 2-3 Monitor platelets by anticoagulation protocol: Yes   Plan:  Daily INR Continue for hold for procedure Hep INR < 2 in Tat's  Note  Levester Fresh, PharmD, BCPS, [facility] Clinical Pharmacist Pager [phone] 05/23/2016 12:44 PM

## 2016-05-23 NOTE — Progress Notes (Signed)
Notified that BCID was Enterococcus species -blood culture with GPC in pair -restart IV vancomycin -Echo; likely needs TEE, will start with TTE -surveillance blood cultures in am  DTat

## 2016-05-23 NOTE — Progress Notes (Addendum)
PROGRESS NOTE  [name] [id number] DOB: 1924/03/24 DOA: 05/22/2016 PCP: Merrilee Seashore, MD  Brief History:  82 year old male with a history of atrial fibrillation, TIA, hypertension, sick sinus syndrome status post PPM presented with worsening low back pain and confusion. The patient was admitted to [facility] from 03/16/2016 through 03/18/2016 after suffering a mechanical fall resulting in increasing right hip pain. Workup including CT of the hip at that time did not reveal a fracture. He was sent to physical nursing facility for rehabilitation. Unfortunately, pt continued to have significant back pain and declining mental status.  As a result, the patient was brought to the emergency department. CT of the lumbar spine revealed compression fracture of L1 and burst fracture of L2 with ESR 140.  The patient was seen by neurosurgery who felt that the patient was a poor surgical candidate.  Assessment/Plan: L1-L2 compression fracture -TLSO brace -Appreciate neurosurgery consultation -case discussed with Dr. Shanon Brow Jones-->concerned about ESR 140 -pain control -MR not possible due to PPM -Discontinue antibiotics and monitor clinically -Leukocytosis may benefit from recent steroids -Follow blood culture  Acute encephalopathy -check serum B12 -TSH -Ammonia -continue amiodarone -Chest x-ray -EKG -UA--no pyuria -start IVF as po has poor po intake  Atrial fibrillation -CHADS-VASc = 5 -hold coumadin in anticipation of possible procedure -start heparin when INR <2  Coaguloapthy -if INR not trending down in next 24 hours, will give vitamin K and then start IV heparin until biopsy is done  CKD stage 3 -Baseline creatinine 1.0-1.2 -Check BMP  Hypothyroidism -Continue Synthroid  Hyperlipidemia -continue zocor  SSS -S/p PPM -monitor on tele    Disposition Plan:   SNF in 2-3 days  Family Communication:   No Family at bedside --Total time 45 minutes. Greater than  50% spent counseling and cognitive care  Consultants:  Neurosurgery  Code Status:  FULL  DVT Prophylaxis:  Full AC with warfarin   Procedures: As Listed in Progress Note Above  Antibiotics: None    Subjective: Patient denies fevers, chills, headache, chest pain, dyspnea, nausea, vomiting, diarrhea, abdominal pain, dysuria. Patient states that back pain is not too bad.   Objective: Filed Vitals:   05/22/16 2321 05/23/16 0054 05/23/16 0215 05/23/16 0618  BP: 139/73 128/57  137/68  Pulse: 84 78 72 60  Temp: 99.8 F (37.7 C) 100.5 F (38.1 C) 97.6 F (36.4 C) 97.9 F (36.6 C)  TempSrc: Oral Oral Oral Oral  Resp: '16 18  18  '$ Height: '5\' 8"'$  (1.727 m)     Weight: 68.3 kg (150 lb 9.2 oz)     SpO2: 95% 89% 99% 100%    Intake/Output Summary (Last 24 hours) at 05/23/16 0737 Last data filed at 05/22/16 1746  Gross per 24 hour  Intake   1000 ml  Output   1400 ml  Net   -400 ml   Weight change:  Exam:   General:  Pt is alert, follows commands appropriately, not in acute distress  HEENT: No icterus, No thrush, No neck mass, [place]/AT  Cardiovascular: RRR, S1/S2, no rubs, no gallops  Respiratory: Bibasilar crackles. No wheeze.  Abdomen: Soft/+BS, non tender, non distended, no guarding  Extremities: No edema, No lymphangitis, No petechiae, No rashes, no synovitis   Data Reviewed: I have personally reviewed following labs and imaging studies Basic Metabolic Panel:  Recent Labs Lab 05/22/16 1551 05/23/16 0447  NA 131* 135  K 4.1 4.8  CL 97* 104  CO2 25 26  GLUCOSE 158* 121*  BUN 18 16  CREATININE 1.17 1.16  CALCIUM 8.6* 8.0*   Liver Function Tests: No results for input(s): AST, ALT, ALKPHOS, BILITOT, PROT, ALBUMIN in the last 168 hours. No results for input(s): LIPASE, AMYLASE in the last 168 hours. No results for input(s): AMMONIA in the last 168 hours. Coagulation Profile:  Recent Labs Lab 05/22/16 1551 05/23/16 0447  INR 3.18* 4.22*    CBC:  Recent Labs Lab 05/22/16 1551 05/23/16 0447  WBC 12.6* 9.0  NEUTROABS 10.6*  --   HGB 10.1* 8.5*  HCT 31.2* 27.3*  MCV 92.6 94.1  PLT 256 225   Cardiac Enzymes: No results for input(s): CKTOTAL, CKMB, CKMBINDEX, TROPONINI in the last 168 hours. BNP: Invalid input(s): POCBNP CBG: No results for input(s): GLUCAP in the last 168 hours. HbA1C: No results for input(s): HGBA1C in the last 72 hours. Urine analysis:    Component Value Date/Time   COLORURINE YELLOW 05/22/2016 1754   APPEARANCEUR CLEAR 05/22/2016 1754   LABSPEC 1.016 05/22/2016 1754   PHURINE 7.0 05/22/2016 1754   GLUCOSEU NEGATIVE 05/22/2016 1754   GLUCOSEU NEGATIVE 09/28/2008 1243   HGBUR NEGATIVE 05/22/2016 1754   BILIRUBINUR NEGATIVE 05/22/2016 1754   KETONESUR NEGATIVE 05/22/2016 1754   PROTEINUR NEGATIVE 05/22/2016 1754   UROBILINOGEN 0.2 mg/dL 09/28/2008 1243   NITRITE NEGATIVE 05/22/2016 1754   LEUKOCYTESUR NEGATIVE 05/22/2016 1754   Sepsis Labs: '@LABRCNTIP'$ (procalcitonin:4,lacticidven:4) ) Recent Results (from the past 240 hour(s))  MRSA PCR Screening     Status: None   Collection Time: 05/23/16  1:17 AM  Result Value Ref Range Status   MRSA by PCR NEGATIVE NEGATIVE Final    Comment:        The GeneXpert MRSA Assay (FDA approved for NASAL specimens only), is one component of a comprehensive MRSA colonization surveillance program. It is not intended to diagnose MRSA infection nor to guide or monitor treatment for MRSA infections.      Scheduled Meds: . amiodarone  100 mg Oral Daily  . levothyroxine  25 mcg Oral QAC breakfast  . simvastatin  20 mg Oral QHS   Continuous Infusions:   Procedures/Studies: Ct Head Wo Contrast  05/22/2016  CLINICAL DATA:  Back pain, leg pain EXAM: CT HEAD WITHOUT CONTRAST TECHNIQUE: Contiguous axial images were obtained from the base of the skull through the vertex without intravenous contrast. COMPARISON:  09/21/2011 FINDINGS: Brain: No evidence of  acute infarction, hemorrhage, extra-axial collection, ventriculomegaly, or mass effect. There is an area of encephalomalacia in the right parietal lobe. 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 Generalized cerebral atrophy. Periventricular white matter low attenuation likely secondary to microangiopathy. Vascular: Cerebrovascular atherosclerotic calcifications are noted. Skull: Negative for fracture or focal lesion. Sinuses/Orbits: Visualized portions of the orbits are unremarkable. Visualized portions of the paranasal sinuses and mastoid air cells are unremarkable. Other: None. IMPRESSION: 1. No acute intracranial pathology. 2. Chronic microvascular disease and cerebral atrophy. Electronically Signed   By: Kathreen Devoid   On: 05/22/2016 21:17   Ct Lumbar Spine Wo Contrast  05/22/2016  CLINICAL DATA:  Pain post fall EXAM: CT LUMBAR SPINE WITHOUT CONTRAST TECHNIQUE: Multidetector CT imaging of the lumbar spine was performed without intravenous contrast administration. Multiplanar CT image reconstructions were also generated. COMPARISON:  CT 10/26/2007 FINDINGS: Five non rib bearing lobar is segments labeled L1-L5. T11-12: Bridging anterior endplate osteophytes. Osteopenia. Central canal and foramina patent. T12-L1: Bridging anterior osteophytes. Central canal and foramina patent. L1-2: Compression fracture deformity involving inferior  endplate of L1 with a estimated 50% loss of height, more severe right than left. Additionally, there is burst fracture of L2 with greater than 50% loss of height anteriorly left greater than right. Fracture at L2 extends to involve the pedicles bilaterally, minimally displaced. Approximately 4 mm retropulsion at the level of the superior endplate of L2. There is a small amount of gas in the interspace extending up into the fractured component of L1. No unexpected soft tissue component. L2-3:  Mild disc bulge.  Mild bilateral facet DJD. L3-4:  Disc bulge.   Bilateral facet DJD.  Probable spinal stenosis. L4-5: Disc narrowing. Disc bulge. Grade 1 anterolisthesis probably secondary to the moderate facet DJD. Probable spinal stenosis. L5-S1:  Asymmetric disc narrowing.  Mild bulge.  Facet DJD. Fusion across bilateral sacroiliac joints. Patchy aortoiliac arterial calcifications without aneurysm. Cardiac pacing leads partially visualized. IMPRESSION: 1. Compression fracture of L1 and burst fracture of L2, as detailed above, probably posttraumatic. No other findings to suggest discitis or metastatic involvement. Findings were reviewed with Dr. [place] Crane, who concurs. 2. Spondylitic changes L2 to S1, with probable spinal stenosis L3-4 and L4-5. Electronically Signed   By: Lucrezia Europe M.D.   On: 05/22/2016 18:05   Ct Hip Right Wo Contrast  05/22/2016  CLINICAL DATA:  Fall today. Severe lower back pain and right hip pain EXAM: CT OF THE RIGHT HIP WITHOUT CONTRAST TECHNIQUE: Multidetector CT imaging of the right hip was performed according to the standard protocol. Multiplanar CT image reconstructions were also generated. COMPARISON:  03/16/2016 FINDINGS: Negative for fracture no significant effusion. Minor spurring from the femoral head and superior acetabulum. Right superior and inferior ischiopubic rami intact. Fusion across the right SI joint. Patchy iliac arterial calcifications without aneurysm. Urinary bladder is distended. IMPRESSION: 1. Negative.  No hip fracture. Electronically Signed   By: Lucrezia Europe M.D.   On: 05/22/2016 18:08    Nakhia Levitan, DO  [facility] Pager [phone]  If 7PM-7AM, please contact night-coverage [email or web address] Password [facility] 05/23/2016, 7:37 AM   LOS: 1 day

## 2016-05-23 NOTE — Consult Note (Signed)
Chief Complaint: Patient was seen in consultation today for L 2 biopsy Chief Complaint  Patient presents with  . Fall   at the request of Dr Shanon Brow Tat  Referring Physician(s): Dr Shanon Brow Tat  Supervising Physician: Luanne Bras  Patient Status: Inpatient  History of Present Illness: [name] is a 82 y.o. male   Pt with Hx Afib; TIA- On coumadin : INR 4.22 6/23  Suffered fall and was admitted into [facility] 4/16-18 Discharged to SNF  Still complaining of back pain L1 fx and L2 burst fx noted on CT 6/22:  IMPRESSION: 1. Compression fracture of L1 and burst fracture of L2, as detailed above, probably posttraumatic. No other findings to suggest discitis or metastatic involvement. Findings were reviewed with Dr. [place] Crane, who concurs. 2. Spondylitic changes L2 to S1, with probable spinal stenosis L3-4 and L4-5.  ESR 140 Spike temp 100.5 this am; now afeb Wbc wnl UA wnl 6/22 Neurosurgery feels poor surgical candidate Imaging reviewed by Dr Estanislado Pandy With ESR 140---approves L2 biopsy when INR wnl for now    Past Medical History  Diagnosis Date  . Atrial fibrillation (HCC)     coumadin Rx  . Sick sinus syndrome (HCC)     s/p PPM  . Ataxia     followed by Dr. Erling Cruz  . Hyperlipidemia   . Hypothyroidism   . BPH (benign prostatic hyperplasia)   . Hypertension   . TIA (transient ischemic attack)     carotid dopplers 5/11: 0-39% bilateral; follow up due 03/2012  . Peripheral neuropathy [facility])     Past Surgical History  Procedure Laterality Date  . Pacemaker insertion      implanted 10/96, most recent Gen Change by Dr Olevia Perches 8/08    Allergies: Guaifenesin er and Prednisone  Medications: Prior to Admission medications   Medication Sig Start Date End Date Taking? Authorizing Provider  acetaminophen (TYLENOL) 500 MG tablet Take 500 mg by mouth every 6 (six) hours as needed for moderate pain.   Yes Historical Provider, MD  amiodarone (PACERONE) 200 MG tablet  Take 0.5 tablets (100 mg total) by mouth daily. 05/20/16  Yes Thompson Grayer, MD  levothyroxine (SYNTHROID, LEVOTHROID) 25 MCG tablet Take 25 mcg by mouth daily before breakfast.   Yes Historical Provider, MD  Multiple Vitamin (MULTIVITAMIN) tablet Take 1 tablet by mouth daily.     Yes Historical Provider, MD  Nutritional Supplements (FIBER FORMULA) POWD Take 1 packet by mouth daily as needed (bowels).    Yes Historical Provider, MD  simvastatin (ZOCOR) 20 MG tablet Take 20 mg by mouth at bedtime.     Yes Historical Provider, MD  warfarin (COUMADIN) 5 MG tablet Take 1 tablet (5 mg total) by mouth as directed. Patient taking differently: Take 2.5-5 mg by mouth as directed. Take 2.5 mg (0.5 tablet) on Tues, Thurs, Sat and Sun and Take 5 mg (1 tablet) on Mon, Wed and Fri 10/29/15  Yes Thompson Grayer, MD  HYDROcodone-acetaminophen (NORCO/VICODIN) 5-325 MG tablet Take 1 tablet by mouth every 6 (six) hours as needed for severe pain. 03/18/16   Silver Huguenin Elgergawy, MD  tamsulosin (FLOMAX) 0.4 MG CAPS capsule Take 1 capsule (0.4 mg total) by mouth daily. 03/18/16   Albertine Patricia, MD     Family History  Problem Relation Age of Onset  . Stroke Mother     Social History   Social History  . Marital Status: Married    Spouse Name: Hassan Rowan  . Number of Children:  4  . Years of Education: College   Occupational History  . Retired     [employer]   Social History Main Topics  . Smoking status: Former Smoker    Quit date: 12/01/1977  . Smokeless tobacco: Never Used  . Alcohol Use: No     Comment: quit in 1995  . Drug Use: No  . Sexual Activity: Not Asked   Other Topics Concern  . None   Social History Narrative   Patient lives at home with his spouse.   Caffeine Use: 1 caffeine beverage daily     Review of Systems: A 12 point ROS discussed and pertinent positives are indicated in the HPI above.  All other systems are negative.  Review of Systems  Constitutional: Positive for  activity change, appetite change and fatigue. Negative for fever.  Respiratory: Negative for shortness of breath.   Gastrointestinal: Negative for abdominal pain.  Musculoskeletal: Positive for back pain and gait problem.  Neurological: Positive for weakness.  Psychiatric/Behavioral: Negative for behavioral problems and confusion.    Vital Signs: BP 123/67 mmHg  Pulse 65  Temp(Src) 98.2 F (36.8 C) (Oral)  Resp 16  Ht '5\' 8"'$  (1.727 m)  Wt 150 lb 9.2 oz (68.3 kg)  BMI 22.90 kg/m2  SpO2 100%  Physical Exam  Constitutional: He appears well-nourished.  Cardiovascular:  No murmur heard. Irreg rate  Pulmonary/Chest: Effort normal and breath sounds normal. He has no wheezes.  Abdominal: Soft. Bowel sounds are normal. There is no tenderness.  Musculoskeletal: Normal range of motion.  Moves all 4s Slow to move Low back pain  Neurological: He is alert.  Groggy Wife and dtrs at bedside  Skin: Skin is warm and dry.  Psychiatric: He has a normal mood and affect. His behavior is normal.  Consented wife for L2 bx  Nursing note and vitals reviewed.   Mallampati Score:  MD Evaluation Airway: WNL Heart: WNL Abdomen: WNL Chest/ Lungs: WNL ASA  Classification: 3 Mallampati/Airway Score: Two  Imaging: Ct Head Wo Contrast  05/22/2016  CLINICAL DATA:  Back pain, leg pain EXAM: CT HEAD WITHOUT CONTRAST TECHNIQUE: Contiguous axial images were obtained from the base of the skull through the vertex without intravenous contrast. COMPARISON:  09/21/2011 FINDINGS: Brain: No evidence of acute infarction, hemorrhage, extra-axial collection, ventriculomegaly, or mass effect. There is an area of encephalomalacia in the right parietal lobe. 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 Generalized cerebral atrophy. Periventricular white matter low attenuation likely secondary to microangiopathy. Vascular: Cerebrovascular atherosclerotic calcifications are noted. Skull: Negative for  fracture or focal lesion. Sinuses/Orbits: Visualized portions of the orbits are unremarkable. Visualized portions of the paranasal sinuses and mastoid air cells are unremarkable. Other: None. IMPRESSION: 1. No acute intracranial pathology. 2. Chronic microvascular disease and cerebral atrophy. Electronically Signed   By: Kathreen Devoid   On: 05/22/2016 21:17   Ct Lumbar Spine Wo Contrast  05/22/2016  CLINICAL DATA:  Pain post fall EXAM: CT LUMBAR SPINE WITHOUT CONTRAST TECHNIQUE: Multidetector CT imaging of the lumbar spine was performed without intravenous contrast administration. Multiplanar CT image reconstructions were also generated. COMPARISON:  CT 10/26/2007 FINDINGS: Five non rib bearing lobar is segments labeled L1-L5. T11-12: Bridging anterior endplate osteophytes. Osteopenia. Central canal and foramina patent. T12-L1: Bridging anterior osteophytes. Central canal and foramina patent. L1-2: Compression fracture deformity involving inferior endplate of L1 with a estimated 50% loss of height, more severe right than left. Additionally, there is burst fracture of L2 with greater  than 50% loss of height anteriorly left greater than right. Fracture at L2 extends to involve the pedicles bilaterally, minimally displaced. Approximately 4 mm retropulsion at the level of the superior endplate of L2. There is a small amount of gas in the interspace extending up into the fractured component of L1. No unexpected soft tissue component. L2-3:  Mild disc bulge.  Mild bilateral facet DJD. L3-4:  Disc bulge.  Bilateral facet DJD.  Probable spinal stenosis. L4-5: Disc narrowing. Disc bulge. Grade 1 anterolisthesis probably secondary to the moderate facet DJD. Probable spinal stenosis. L5-S1:  Asymmetric disc narrowing.  Mild bulge.  Facet DJD. Fusion across bilateral sacroiliac joints. Patchy aortoiliac arterial calcifications without aneurysm. Cardiac pacing leads partially visualized. IMPRESSION: 1. Compression fracture of  L1 and burst fracture of L2, as detailed above, probably posttraumatic. No other findings to suggest discitis or metastatic involvement. Findings were reviewed with Dr. Nevada Crane, who concurs. 2. Spondylitic changes L2 to S1, with probable spinal stenosis L3-4 and L4-5. Electronically Signed   By: Lucrezia Europe M.D.   On: 05/22/2016 18:05   Ct Hip Right Wo Contrast  05/22/2016  CLINICAL DATA:  Fall today. Severe lower back pain and right hip pain EXAM: CT OF THE RIGHT HIP WITHOUT CONTRAST TECHNIQUE: Multidetector CT imaging of the right hip was performed according to the standard protocol. Multiplanar CT image reconstructions were also generated. COMPARISON:  03/16/2016 FINDINGS: Negative for fracture no significant effusion. Minor spurring from the femoral head and superior acetabulum. Right superior and inferior ischiopubic rami intact. Fusion across the right SI joint. Patchy iliac arterial calcifications without aneurysm. Urinary bladder is distended. IMPRESSION: 1. Negative.  No hip fracture. Electronically Signed   By: Lucrezia Europe M.D.   On: 05/22/2016 18:08   Dg Chest Port 1 View  05/23/2016  CLINICAL DATA:  Low back pain, progressively worsening since fall April 2017. Cough. EXAM: PORTABLE CHEST 1 VIEW COMPARISON:  Chest CT 09/12/2013 FINDINGS: Right pacer in place with leads in the right atrium and right ventricle. Heart is normal size. No confluent airspace opacities. Scattered aortic calcifications. No acute bony abnormality. IMPRESSION: No active cardiopulmonary disease. Aortic atherosclerosis. Electronically Signed   By: Rolm Baptise M.D.   On: 05/23/2016 09:01    Labs:  CBC:  Recent Labs  03/16/16 2049 03/17/16 0355 05/22/16 1551 05/23/16 0447  WBC 8.3 6.4 12.6* 9.0  HGB 12.2* 11.8* 10.1* 8.5*  HCT 35.9* 35.1* 31.2* 27.3*  PLT 175 175 256 225    COAGS:  Recent Labs  05/12/16 1009 05/16/16 05/22/16 1551 05/23/16 0447  INR 2.0* 3.2 3.18* 4.22*    BMP:  Recent Labs   03/17/16 0355 03/18/16 0353 05/22/16 1551 05/23/16 0447  NA 140 141 131* 135  K 3.8 4.9 4.1 4.8  CL 106 104 97* 104  CO2 '26 27 25 26  '$ GLUCOSE 201* 133* 158* 121*  BUN 31* 33* 18 16  CALCIUM 8.4* 8.6* 8.6* 8.0*  CREATININE 1.48* 1.14 1.17 1.16  GFRNONAA 40* 55* 53* 54*  GFRAA 47* >60 >60 >60    LIVER FUNCTION TESTS: No results for input(s): BILITOT, AST, ALT, ALKPHOS, PROT, ALBUMIN in the last 8760 hours.  TUMOR MARKERS: No results for input(s): AFPTM, CEA, CA199, CHROMGRNA in the last 8760 hours.  Assessment and Plan:  Severe back pain L1 fx; L2 burst fx ESR 140 Plan for L2 biopsy when INR wnl (poss 6/26 Mon---will check INR) Risks and Benefits discussed with the patient including, but not limited to  bleeding, infection, damage to adjacent structures or low yield requiring additional tests. All of the patient's questions were answered, patient is agreeable to proceed. Consent signed and in chart.    Thank you for this interesting consult.  I greatly enjoyed meeting Jahkari Maclin and look forward to participating in their care.  A copy of this report was sent to the requesting provider on this date.  Electronically Signed: Monia Sabal A 05/23/2016, 10:59 AM   I spent a total of 40 Minutes    in face to face in clinical consultation, greater than 50% of which was counseling/coordinating care for L2 bx

## 2016-05-23 NOTE — Progress Notes (Signed)
Pharmacy Antibiotic Note  [name] is a 82 y.o. male admitted on 05/22/2016 with sepsis.  Pharmacy has been consulted for vancomycin dosing. Pt is afebrile and WBC is elevated at 12.6. Scr is 1.18.   6/23> BCID positive for enterococcus, restarted vancomycin  Plan: - Vanc 1500mg  IV x 1 then 750mg  IV Q12H - F/u renal fxn, C&S, clinical status and trough at SS  Height: 5\' 8"  (172.7 cm) Weight: 150 lb 9.2 oz (68.3 kg) IBW/kg (Calculated) : 68.4  Temp (24hrs), Avg:98.7 F (37.1 C), Min:97.6 F (36.4 C), Max:100.5 F (38.1 C)   Recent Labs Lab 05/22/16 1551 05/23/16 0447  WBC 12.6* 9.0  CREATININE 1.17 1.16    Estimated Creatinine Clearance: 41.7 mL/min (by C-G formula based on Cr of 1.16).    Allergies  Allergen Reactions  . Guaifenesin Er Palpitations    Pt can not take mucinex PM  . Prednisone Other (See Comments)    HIGH doses make patient feel "crazy"    Antimicrobials this admission: Vanc 6/22>>6/23  6/23> CTX 6/22>>6/23  Dose adjustments this admission: N/A  Microbiology results: Pending  Thank you for allowing pharmacy to be a part of this patient's care.  Tad Moore 05/23/2016 5:36 PM

## 2016-05-24 ENCOUNTER — Inpatient Hospital Stay (HOSPITAL_COMMUNITY): Payer: Medicare Other

## 2016-05-24 DIAGNOSIS — R7881 Bacteremia: Secondary | ICD-10-CM

## 2016-05-24 DIAGNOSIS — S32021G Stable burst fracture of second lumbar vertebra, subsequent encounter for fracture with delayed healing: Secondary | ICD-10-CM

## 2016-05-24 DIAGNOSIS — S32010G Wedge compression fracture of first lumbar vertebra, subsequent encounter for fracture with delayed healing: Secondary | ICD-10-CM

## 2016-05-24 DIAGNOSIS — R6883 Chills (without fever): Secondary | ICD-10-CM

## 2016-05-24 DIAGNOSIS — B952 Enterococcus as the cause of diseases classified elsewhere: Secondary | ICD-10-CM | POA: Diagnosis present

## 2016-05-24 DIAGNOSIS — G9340 Encephalopathy, unspecified: Secondary | ICD-10-CM

## 2016-05-24 DIAGNOSIS — I480 Paroxysmal atrial fibrillation: Secondary | ICD-10-CM

## 2016-05-24 DIAGNOSIS — W19XXXD Unspecified fall, subsequent encounter: Secondary | ICD-10-CM

## 2016-05-24 LAB — ECHOCARDIOGRAM COMPLETE
AOASC: 32 cm
AV Mean grad: 9 mmHg
AV area mean vel ind: 0.85 cm2/m2
AV pk vel: 223 cm/s
AV vel: 1.64
AVA: 1.64 cm2
AVAREAMEANV: 1.54 cm2
AVAREAVTI: 1.54 cm2
AVAREAVTIIND: 0.91 cm2/m2
AVCELMEANRAT: 0.54
AVLVOTPG: 6 mmHg
AVPG: 20 mmHg
Ao pk vel: 0.54 m/s
CHL CUP AV PEAK INDEX: 0.85
CHL CUP MV DEC (S): 204
DOP CAL AO MEAN VELOCITY: 145 cm/s
E decel time: 204 msec
E/e' ratio: 11.01
FS: 33 % (ref 28–44)
Height: 68 in
IV/PV OW: 0.93
LA diam end sys: 26 mm
LA diam index: 1.44 cm/m2
LA vol A4C: 48.9 ml
LA vol index: 29 mL/m2
LASIZE: 26 mm
LAVOL: 52.4 mL
LDCA: 2.84 cm2
LV E/e' medial: 11.01
LV E/e'average: 11.01
LV PW d: 9.37 mm — AB (ref 0.6–1.1)
LV SIMPSON'S DISK: 68
LV TDI E'MEDIAL: 5.87
LV dias vol index: 41 mL/m2
LV e' LATERAL: 6.96 cm/s
LV sys vol index: 13 mL/m2
LVDIAVOL: 74 mL (ref 62–150)
LVOT SV: 72 mL
LVOT VTI: 25.2 cm
LVOT peak VTI: 0.58 cm
LVOT peak vel: 121 cm/s
LVOTD: 19 mm
LVSYSVOL: 23 mL (ref 21–61)
MV Peak grad: 2 mmHg
MV pk E vel: 76.6 m/s
MVPKAVEL: 108 m/s
P 1/2 time: 641 ms
PV Reg vel dias: 78.7 cm/s
Stroke v: 50 ml
TAPSE: 31.8 mm
TDI e' lateral: 6.96
VTI: 43.7 cm
Valve area index: 0.91
Weight: 2409.19 oz

## 2016-05-24 LAB — GLUCOSE, CAPILLARY: GLUCOSE-CAPILLARY: 136 mg/dL — AB (ref 65–99)

## 2016-05-24 LAB — PROTIME-INR
INR: 4.32 — ABNORMAL HIGH (ref 0.00–1.49)
PROTHROMBIN TIME: 40.3 s — AB (ref 11.6–15.2)

## 2016-05-24 MED ORDER — PHYTONADIONE 5 MG PO TABS
5.0000 mg | ORAL_TABLET | Freq: Once | ORAL | Status: AC
  Administered 2016-05-24: 5 mg via ORAL
  Filled 2016-05-24: qty 1

## 2016-05-24 MED ORDER — PERFLUTREN LIPID MICROSPHERE
1.0000 mL | INTRAVENOUS | Status: AC | PRN
  Administered 2016-05-24: 2 mL via INTRAVENOUS
  Filled 2016-05-24: qty 10

## 2016-05-24 NOTE — Progress Notes (Signed)
  Echocardiogram 2D Echocardiogram has been performed.  Aggie Cosier 05/24/2016, 9:43 AM

## 2016-05-24 NOTE — Progress Notes (Addendum)
PROGRESS NOTE  [name] ZSM:[id number] DOB: 1924-02-22 DOA: 05/22/2016 PCP: Merrilee Seashore, MD Brief History:  82 year old male with a history of atrial fibrillation, TIA, hypertension, sick sinus syndrome status post PPM presented with worsening low back pain and confusion. The patient was admitted to [facility] from 03/16/2016 through 03/18/2016 after suffering a mechanical fall resulting in increasing right hip pain. Workup including CT of the hip at that time did not reveal a fracture. He was sent to physical nursing facility for rehabilitation. Unfortunately, pt continued to have significant back pain and declining mental status. As a result, the patient was brought to the emergency department. CT of the lumbar spine revealed compression fracture of L1 and burst fracture of L2 with ESR 140. The patient was seen by neurosurgery who felt that the patient was a poor surgical candidate.  Assessment/Plan: Enterococcus Bacteremia -unknown source -continue IV vancomycin pending final susceptibility -05/22/16 blood culture--enterococcus sp. -4/92/01--EOF--HQ 19-75%, no embolic source -8/83/25--QDIYMEBRAXEN blood cultures -lumbar biopsy planned 6/26 or 6/27--discussed with Dr. Estanislado Pandy  L1-L2 compression fracture -TLSO brace -Appreciate neurosurgery consultation -case discussed with Dr. Shanon Brow Jones-->concerned about ESR 140 -pain control -MR not possible due to PPM -continue oxycodone for pain  Acute encephalopathy -check serum B12--644 -TSH--4.22 -Ammonia--22 -continue amiodarone -Chest x-ray--neg -EKG--sinus, no concerning ST-T changes -UA--no pyuria -continue IVF as po has poor po intake  Atrial fibrillation -CHADS-VASc = 5 -hold coumadin in anticipation of possible procedure -start heparin when INR <2  Coaguloapthy -INR not trending down -vitamin K 5 mg x 1 -INR in am  CKD stage 3 -Baseline creatinine 1.0-1.2 -Check BMP  Hypothyroidism -Continue  Synthroid  Hyperlipidemia -continue zocor  SSS -S/p PPM -monitor on tele  Disposition Plan: SNF in 3-4 days  Family Communication:wife and daughter updated at bedside --Total time 35 minutes. Greater than 50% spent counseling and cognitive care  Consultants: Neurosurgery, ID  Code Status: FULL  DVT Prophylaxis: Full AC with warfarin   Procedures: As Listed in Progress Note Above  Antibiotics: None    Subjective: Patient continues to complain of back pain severe in nature with movement. It is low but better than the past 1-2 days. It is sharp in nature occasionally radiating down the leg, worse with movement. Denies any fevers, chills, chest pain, short of breath, nausea, vomiting, diarrhea. No headache, neck pain, abdominal pain, medication, melena.  Objective: Filed Vitals:   05/24/16 0130 05/24/16 0546 05/24/16 0907 05/24/16 1439  BP: 109/55 147/82 146/62 116/55  Pulse: 67 73 61 69  Temp: 98 F (36.7 C) 98.6 F (37 C) 98 F (36.7 C) 98.6 F (37 C)  TempSrc: Oral Oral Oral Oral  Resp: '18 18 18 18  '$ Height:      Weight:      SpO2: 98% 100% 100% 99%    Intake/Output Summary (Last 24 hours) at 05/24/16 1522 Last data filed at 05/24/16 1400  Gross per 24 hour  Intake   1240 ml  Output    700 ml  Net    540 ml   Weight change:  Exam:   General:  Pt is alert, follows commands appropriately, not in acute distress  HEENT: No icterus, No thrush, No neck mass, /AT  Cardiovascular: RRR, S1/S2, no rubs, no gallops  Respiratory: Bibasilar crackles without wheezing. Good air movement  Abdomen: Soft/+BS, non tender, non distended, no guarding  Extremities: No edema, No lymphangitis, No petechiae, No rashes, no synovitis   Data Reviewed: I  have personally reviewed following labs and imaging studies Basic Metabolic Panel:  Recent Labs Lab 05/22/16 1551 05/23/16 0447  NA 131* 135  K 4.1 4.8  CL 97* 104  CO2 25 26  GLUCOSE 158* 121*  BUN 18  16  CREATININE 1.17 1.16  CALCIUM 8.6* 8.0*   Liver Function Tests: No results for input(s): AST, ALT, ALKPHOS, BILITOT, PROT, ALBUMIN in the last 168 hours. No results for input(s): LIPASE, AMYLASE in the last 168 hours.  Recent Labs Lab 05/23/16 0857  AMMONIA 22   Coagulation Profile:  Recent Labs Lab 05/22/16 1551 05/23/16 0447 05/24/16 0456  INR 3.18* 4.22* 4.32*   CBC:  Recent Labs Lab 05/22/16 1551 05/23/16 0447  WBC 12.6* 9.0  NEUTROABS 10.6*  --   HGB 10.1* 8.5*  HCT 31.2* 27.3*  MCV 92.6 94.1  PLT 256 225   Cardiac Enzymes: No results for input(s): CKTOTAL, CKMB, CKMBINDEX, TROPONINI in the last 168 hours. BNP: Invalid input(s): POCBNP CBG: No results for input(s): GLUCAP in the last 168 hours. HbA1C: No results for input(s): HGBA1C in the last 72 hours. Urine analysis:    Component Value Date/Time   COLORURINE YELLOW 05/22/2016 1754   APPEARANCEUR CLEAR 05/22/2016 1754   LABSPEC 1.016 05/22/2016 1754   PHURINE 7.0 05/22/2016 1754   GLUCOSEU NEGATIVE 05/22/2016 1754   GLUCOSEU NEGATIVE 09/28/2008 1243   HGBUR NEGATIVE 05/22/2016 1754   BILIRUBINUR NEGATIVE 05/22/2016 1754   KETONESUR NEGATIVE 05/22/2016 1754   PROTEINUR NEGATIVE 05/22/2016 1754   UROBILINOGEN 0.2 mg/dL 09/28/2008 1243   NITRITE NEGATIVE 05/22/2016 1754   LEUKOCYTESUR NEGATIVE 05/22/2016 1754   Sepsis Labs: '@LABRCNTIP'$ (procalcitonin:4,lacticidven:4) ) Recent Results (from the past 240 hour(s))  Blood culture (routine x 2)     Status: Abnormal (Preliminary result)   Collection Time: 05/22/16 10:12 PM  Result Value Ref Range Status   Specimen Description BLOOD RIGHT HAND  Final   Special Requests IN PEDIATRIC BOTTLE 2ML  Final   Culture  Setup Time   Final    GRAM POSITIVE COCCI IN PAIRS IN CHAINS CRITICAL RESULT CALLED TO, READ BACK BY AND VERIFIED WITH: TValinda Party.D. 17:20 05/23/16 ([email or web address]) IN PEDIATRIC BOTTLE    Culture ENTEROCOCCUS SPECIES (A)  Final   Report  Status PENDING  Incomplete  Blood culture (routine x 2)     Status: Abnormal (Preliminary result)   Collection Time: 05/22/16 10:18 PM  Result Value Ref Range Status   Specimen Description BLOOD LEFT FOREARM  Final   Special Requests BOTTLES DRAWN AEROBIC AND ANAEROBIC 5ML  Final   Culture  Setup Time   Final    GRAM POSITIVE COCCOBACILLUS IN BOTH AEROBIC AND ANAEROBIC BOTTLES CRITICAL RESULT CALLED TO, READ BACK BY AND VERIFIED WITH: T EGAN,PHARMD AT 1720 05/23/16 BY M Jovel    Culture ENTEROCOCCUS SPECIES (A)  Final   Report Status PENDING  Incomplete  Blood Culture ID Panel (Reflexed)     Status: Abnormal   Collection Time: 05/22/16 10:18 PM  Result Value Ref Range Status   Enterococcus species DETECTED (A) NOT DETECTED Final    Comment: CRITICAL RESULT CALLED TO, READ BACK BY AND VERIFIED WITH: TValinda Party.D. 17:20 05/23/16 ([email or web address])    Vancomycin resistance NOT DETECTED NOT DETECTED Final   Listeria monocytogenes NOT DETECTED NOT DETECTED Final   Staphylococcus species NOT DETECTED NOT DETECTED Final   Staphylococcus aureus NOT DETECTED NOT DETECTED Final   Methicillin resistance NOT DETECTED NOT DETECTED Final   Streptococcus species NOT  DETECTED NOT DETECTED Final   Streptococcus agalactiae NOT DETECTED NOT DETECTED Final   Streptococcus pneumoniae NOT DETECTED NOT DETECTED Final   Streptococcus pyogenes NOT DETECTED NOT DETECTED Final   Acinetobacter baumannii NOT DETECTED NOT DETECTED Final   Enterobacteriaceae species NOT DETECTED NOT DETECTED Final   Enterobacter cloacae complex NOT DETECTED NOT DETECTED Final   Escherichia coli NOT DETECTED NOT DETECTED Final   Klebsiella oxytoca NOT DETECTED NOT DETECTED Final   Klebsiella pneumoniae NOT DETECTED NOT DETECTED Final   Proteus species NOT DETECTED NOT DETECTED Final   Serratia marcescens NOT DETECTED NOT DETECTED Final   Carbapenem resistance NOT DETECTED NOT DETECTED Final   Haemophilus influenzae NOT DETECTED NOT  DETECTED Final   Neisseria meningitidis NOT DETECTED NOT DETECTED Final   Pseudomonas aeruginosa NOT DETECTED NOT DETECTED Final   Candida albicans NOT DETECTED NOT DETECTED Final   Candida glabrata NOT DETECTED NOT DETECTED Final   Candida krusei NOT DETECTED NOT DETECTED Final   Candida parapsilosis NOT DETECTED NOT DETECTED Final   Candida tropicalis NOT DETECTED NOT DETECTED Final  MRSA PCR Screening     Status: None   Collection Time: 05/23/16  1:17 AM  Result Value Ref Range Status   MRSA by PCR NEGATIVE NEGATIVE Final    Comment:        The GeneXpert MRSA Assay (FDA approved for NASAL specimens only), is one component of a comprehensive MRSA colonization surveillance program. It is not intended to diagnose MRSA infection nor to guide or monitor treatment for MRSA infections.      Scheduled Meds: . amiodarone  100 mg Oral Daily  . levothyroxine  25 mcg Oral QAC breakfast  . simvastatin  20 mg Oral QHS  . vancomycin  750 mg Intravenous Q12H   Continuous Infusions: . sodium chloride 0.9 % 1,000 mL infusion      Procedures/Studies: Ct Head Wo Contrast  05/22/2016  CLINICAL DATA:  Back pain, leg pain EXAM: CT HEAD WITHOUT CONTRAST TECHNIQUE: Contiguous axial images were obtained from the base of the skull through the vertex without intravenous contrast. COMPARISON:  09/21/2011 FINDINGS: Brain: No evidence of acute infarction, hemorrhage, extra-axial collection, ventriculomegaly, or mass effect. There is an area of encephalomalacia in the right parietal lobe. 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 Generalized cerebral atrophy. Periventricular white matter low attenuation likely secondary to microangiopathy. Vascular: Cerebrovascular atherosclerotic calcifications are noted. Skull: Negative for fracture or focal lesion. Sinuses/Orbits: Visualized portions of the orbits are unremarkable. Visualized portions of the paranasal sinuses and mastoid air cells are  unremarkable. Other: None. IMPRESSION: 1. No acute intracranial pathology. 2. Chronic microvascular disease and cerebral atrophy. Electronically Signed   By: Kathreen Devoid   On: 05/22/2016 21:17   Ct Lumbar Spine Wo Contrast  05/22/2016  CLINICAL DATA:  Pain post fall EXAM: CT LUMBAR SPINE WITHOUT CONTRAST TECHNIQUE: Multidetector CT imaging of the lumbar spine was performed without intravenous contrast administration. Multiplanar CT image reconstructions were also generated. COMPARISON:  CT 10/26/2007 FINDINGS: Five non rib bearing lobar is segments labeled L1-L5. T11-12: Bridging anterior endplate osteophytes. Osteopenia. Central canal and foramina patent. T12-L1: Bridging anterior osteophytes. Central canal and foramina patent. L1-2: Compression fracture deformity involving inferior endplate of L1 with a estimated 50% loss of height, more severe right than left. Additionally, there is burst fracture of L2 with greater than 50% loss of height anteriorly left greater than right. Fracture at L2 extends to involve the pedicles bilaterally, minimally displaced. Approximately  4 mm retropulsion at the level of the superior endplate of L2. There is a small amount of gas in the interspace extending up into the fractured component of L1. No unexpected soft tissue component. L2-3:  Mild disc bulge.  Mild bilateral facet DJD. L3-4:  Disc bulge.  Bilateral facet DJD.  Probable spinal stenosis. L4-5: Disc narrowing. Disc bulge. Grade 1 anterolisthesis probably secondary to the moderate facet DJD. Probable spinal stenosis. L5-S1:  Asymmetric disc narrowing.  Mild bulge.  Facet DJD. Fusion across bilateral sacroiliac joints. Patchy aortoiliac arterial calcifications without aneurysm. Cardiac pacing leads partially visualized. IMPRESSION: 1. Compression fracture of L1 and burst fracture of L2, as detailed above, probably posttraumatic. No other findings to suggest discitis or metastatic involvement. Findings were reviewed with  Dr. Nevada Crane, who concurs. 2. Spondylitic changes L2 to S1, with probable spinal stenosis L3-4 and L4-5. Electronically Signed   By: Lucrezia Europe M.D.   On: 05/22/2016 18:05   Ct Hip Right Wo Contrast  05/22/2016  CLINICAL DATA:  Fall today. Severe lower back pain and right hip pain EXAM: CT OF THE RIGHT HIP WITHOUT CONTRAST TECHNIQUE: Multidetector CT imaging of the right hip was performed according to the standard protocol. Multiplanar CT image reconstructions were also generated. COMPARISON:  03/16/2016 FINDINGS: Negative for fracture no significant effusion. Minor spurring from the femoral head and superior acetabulum. Right superior and inferior ischiopubic rami intact. Fusion across the right SI joint. Patchy iliac arterial calcifications without aneurysm. Urinary bladder is distended. IMPRESSION: 1. Negative.  No hip fracture. Electronically Signed   By: Lucrezia Europe M.D.   On: 05/22/2016 18:08   Dg Chest Port 1 View  05/23/2016  CLINICAL DATA:  Low back pain, progressively worsening since fall April 2017. Cough. EXAM: PORTABLE CHEST 1 VIEW COMPARISON:  Chest CT 09/12/2013 FINDINGS: Right pacer in place with leads in the right atrium and right ventricle. Heart is normal size. No confluent airspace opacities. Scattered aortic calcifications. No acute bony abnormality. IMPRESSION: No active cardiopulmonary disease. Aortic atherosclerosis. Electronically Signed   By: Rolm Baptise M.D.   On: 05/23/2016 09:01    Tyrian Peart, DO  [facility] Pager [phone]  If 7PM-7AM, please contact night-coverage [email or web address] Password [email or web address] 05/24/2016, 3:22 PM   LOS: 2 days

## 2016-05-24 NOTE — Consult Note (Signed)
[place] for Infectious Disease    Date of Admission:  05/22/2016           Day 2 vancomycin       Reason for Consult: Automatic consultation for enterococcal bacteremia    Referring Physician: Dr. Shanon Brow Tat  Principal Problem:   Enterococcal bacteremia Active Problems:   Right hip pain   PACEMAKER, PERMANENT   Compression fracture of L2 lumbar vertebra with delayed healing   Hypothyroidism   Hyperlipidemia   HYPERTENSION, BENIGN ESSENTIAL   ATRIAL FIBRILLATION   SICK SINUS SYNDROME   PERIPHERAL VASCULAR DISEASE   BPH (benign prostatic hyperplasia)   Elevated C-reactive protein (CRP)   Altered mental status   . amiodarone  100 mg Oral Daily  . levothyroxine  25 mcg Oral QAC breakfast  . simvastatin  20 mg Oral QHS  . vancomycin  750 mg Intravenous Q12H    Recommendations: 1. Continue vancomycin 2. Repeat blood cultures ordered 3. Transthoracic echocardiogram 4. Agree with lumbar aspirate   Assessment: [name] has enterococcal bacteremia. There is no obvious source. The fact that he has been having chills for the past 2 weeks suggests that he may have been bacteremic for quite some time. This puts him at high risk for endocarditis, pacemaker infection and spine infection. The gas seen in the L1-2 disc space may be related to his fracture but I agree with aspirate to see if there is evidence for infection since that will impact how long he needs to be treated. I would hold off on PICC placement until we know blood cultures are negative.    HPI: [name] is a 82 y.o. male who developed a severe, acute low back and right hip pain after suffering 2 falls on concrete in April. He was hospitalized from 03/16/2016 2 03/18/2016. Radiographs of his right hip and pelvis were unremarkable. His lumbar spine was not imaged. He developed acute urinary retention and was discharged with a Foley catheter. Removed a few days after discharge. He continued to have  severe right hip pain that has progressed over the past few months. In the past few weeks he developed chills without obvious fever. He became confused leading to readmission to the hospital 2 days ago. Both admission blood cultures are growing enterococcus. His urinalysis is unremarkable. CT scan of the lumbar spine shows a compression fracture of L1 and a burst fracture of L2. A small amount of gas is seen in the L1-2 disc space.   Review of Systems: Review of Systems  Constitutional: Positive for fever, chills and malaise/fatigue. Negative for diaphoresis.  HENT: Negative for sore throat.   Respiratory: Negative for cough and sputum production.   Cardiovascular: Negative for chest pain and palpitations.  Gastrointestinal: Negative for heartburn, nausea, vomiting, abdominal pain and diarrhea.  Genitourinary: Negative for dysuria.  Musculoskeletal: Positive for back pain and joint pain. Negative for myalgias.  Skin: Negative for rash.  Neurological: Positive for weakness. Negative for headaches.    Past Medical History  Diagnosis Date  . Atrial fibrillation (HCC)     coumadin Rx  . Sick sinus syndrome (HCC)     s/p PPM  . Ataxia     followed by Dr. Erling Cruz  . Hyperlipidemia   . Hypothyroidism   . BPH (benign prostatic hyperplasia)   . Hypertension   . TIA (transient ischemic attack)     carotid dopplers 5/11: 0-39% bilateral; follow up due 03/2012  .  Peripheral neuropathy [facility])     Social History  Substance Use Topics  . Smoking status: Former Smoker    Quit date: 12/01/1977  . Smokeless tobacco: Never Used  . Alcohol Use: No     Comment: quit in 1995    Family History  Problem Relation Age of Onset  . Stroke Mother    Allergies  Allergen Reactions  . Guaifenesin Er Palpitations    Pt can not take mucinex PM  . Prednisone Other (See Comments)    HIGH doses make patient feel "crazy"    OBJECTIVE: Blood pressure 146/62, pulse 61, temperature 98 F (36.7 C),  temperature source Oral, resp. rate 18, height 5\' 8"  (1.727 m), weight 150 lb 9.2 oz (68.3 kg), SpO2 100 %.  Physical Exam  Constitutional:  He is alert and in good spirits. He is somewhat confused. His wife, daughter and son-in-law are present.  HENT:  Mouth/Throat: No oropharyngeal exudate.  Eyes: Conjunctivae are normal.  No conjunctival hemorrhages.  Cardiovascular: Normal rate and regular rhythm.   Murmur heard. Early 1/6 systolic murmur.  Pulmonary/Chest: Effort normal and breath sounds normal. He has no wheezes. He has no rales.  Right anterior chest pacemaker site appears normal.  Musculoskeletal: Normal range of motion. He exhibits no edema or tenderness.  Neurological: He is alert.  Skin:  Scattered ecchymoses. No splinter hemorrhages.    Lab Results Lab Results  Component Value Date   WBC 9.0 05/23/2016   HGB 8.5* 05/23/2016   HCT 27.3* 05/23/2016   MCV 94.1 05/23/2016   PLT 225 05/23/2016    Lab Results  Component Value Date   CREATININE 1.16 05/23/2016   BUN 16 05/23/2016   NA 135 05/23/2016   K 4.8 05/23/2016   CL 104 05/23/2016   CO2 26 05/23/2016    Lab Results  Component Value Date   ALT 12 10/06/2011   AST 18 10/06/2011   ALKPHOS 64 10/06/2011   BILITOT 0.4 10/06/2011     Microbiology: Recent Results (from the past 240 hour(s))  Blood culture (routine x 2)     Status: Abnormal (Preliminary result)   Collection Time: 05/22/16 10:12 PM  Result Value Ref Range Status   Specimen Description BLOOD RIGHT HAND  Final   Special Requests IN PEDIATRIC BOTTLE 2ML  Final   Culture  Setup Time   Final    GRAM POSITIVE COCCI IN PAIRS IN CHAINS CRITICAL RESULT CALLED TO, READ BACK BY AND VERIFIED WITH: TValinda Party.D. 17:20 05/23/16 ([email or web address]) IN PEDIATRIC BOTTLE    Culture ENTEROCOCCUS SPECIES (A)  Final   Report Status PENDING  Incomplete  Blood culture (routine x 2)     Status: Abnormal (Preliminary result)   Collection Time: 05/22/16 10:18 PM    Result Value Ref Range Status   Specimen Description BLOOD LEFT FOREARM  Final   Special Requests BOTTLES DRAWN AEROBIC AND ANAEROBIC 5ML  Final   Culture  Setup Time   Final    GRAM POSITIVE COCCOBACILLUS IN BOTH AEROBIC AND ANAEROBIC BOTTLES CRITICAL RESULT CALLED TO, READ BACK BY AND VERIFIED WITH: T EGAN,PHARMD AT 1720 05/23/16 BY M Cobin    Culture ENTEROCOCCUS SPECIES (A)  Final   Report Status PENDING  Incomplete  Blood Culture ID Panel (Reflexed)     Status: Abnormal   Collection Time: 05/22/16 10:18 PM  Result Value Ref Range Status   Enterococcus species DETECTED (A) NOT DETECTED Final    Comment: CRITICAL RESULT CALLED TO, READ BACK  BY AND VERIFIED WITH: TElana Alm Pharm.D. 17:20 05/23/16 ([email or web address])    Vancomycin resistance NOT DETECTED NOT DETECTED Final   Listeria monocytogenes NOT DETECTED NOT DETECTED Final   Staphylococcus species NOT DETECTED NOT DETECTED Final   Staphylococcus aureus NOT DETECTED NOT DETECTED Final   Methicillin resistance NOT DETECTED NOT DETECTED Final   Streptococcus species NOT DETECTED NOT DETECTED Final   Streptococcus agalactiae NOT DETECTED NOT DETECTED Final   Streptococcus pneumoniae NOT DETECTED NOT DETECTED Final   Streptococcus pyogenes NOT DETECTED NOT DETECTED Final   Acinetobacter baumannii NOT DETECTED NOT DETECTED Final   Enterobacteriaceae species NOT DETECTED NOT DETECTED Final   Enterobacter cloacae complex NOT DETECTED NOT DETECTED Final   Escherichia coli NOT DETECTED NOT DETECTED Final   Klebsiella oxytoca NOT DETECTED NOT DETECTED Final   Klebsiella pneumoniae NOT DETECTED NOT DETECTED Final   Proteus species NOT DETECTED NOT DETECTED Final   Serratia marcescens NOT DETECTED NOT DETECTED Final   Carbapenem resistance NOT DETECTED NOT DETECTED Final   Haemophilus influenzae NOT DETECTED NOT DETECTED Final   Neisseria meningitidis NOT DETECTED NOT DETECTED Final   Pseudomonas aeruginosa NOT DETECTED NOT DETECTED Final    Candida albicans NOT DETECTED NOT DETECTED Final   Candida glabrata NOT DETECTED NOT DETECTED Final   Candida krusei NOT DETECTED NOT DETECTED Final   Candida parapsilosis NOT DETECTED NOT DETECTED Final   Candida tropicalis NOT DETECTED NOT DETECTED Final  MRSA PCR Screening     Status: None   Collection Time: 05/23/16  1:17 AM  Result Value Ref Range Status   MRSA by PCR NEGATIVE NEGATIVE Final    Comment:        The GeneXpert MRSA Assay (FDA approved for NASAL specimens only), is one component of a comprehensive MRSA colonization surveillance program. It is not intended to diagnose MRSA infection nor to guide or monitor treatment for MRSA infections.     Michel Bickers, MD [facility] [phone] pager   [phone] cell 05/24/2016, 12:29 PM

## 2016-05-25 DIAGNOSIS — Z950 Presence of cardiac pacemaker: Secondary | ICD-10-CM

## 2016-05-25 LAB — CBC
HCT: 28.3 % — ABNORMAL LOW (ref 39.0–52.0)
Hemoglobin: 8.9 g/dL — ABNORMAL LOW (ref 13.0–17.0)
MCH: 29.3 pg (ref 26.0–34.0)
MCHC: 31.4 g/dL (ref 30.0–36.0)
MCV: 93.1 fL (ref 78.0–100.0)
PLATELETS: 284 10*3/uL (ref 150–400)
RBC: 3.04 MIL/uL — AB (ref 4.22–5.81)
RDW: 14.5 % (ref 11.5–15.5)
WBC: 7.8 10*3/uL (ref 4.0–10.5)

## 2016-05-25 LAB — PROTIME-INR
INR: 2.37 — ABNORMAL HIGH (ref 0.00–1.49)
Prothrombin Time: 25.6 seconds — ABNORMAL HIGH (ref 11.6–15.2)

## 2016-05-25 LAB — BASIC METABOLIC PANEL
Anion gap: 7 (ref 5–15)
BUN: 13 mg/dL (ref 6–20)
CALCIUM: 8.3 mg/dL — AB (ref 8.9–10.3)
CO2: 27 mmol/L (ref 22–32)
CREATININE: 1.06 mg/dL (ref 0.61–1.24)
Chloride: 103 mmol/L (ref 101–111)
GFR calc Af Amer: >60 mL/min (ref >60)
GFR calc non Af Amer: >60 mL/min (ref >60)
GLUCOSE: 138 mg/dL — AB (ref 65–99)
Potassium: 3.8 mmol/L (ref 3.5–5.1)
Sodium: 137 mmol/L (ref 135–145)

## 2016-05-25 LAB — CULTURE, BLOOD (ROUTINE X 2)

## 2016-05-25 MED ORDER — PHYTONADIONE 5 MG PO TABS
5.0000 mg | ORAL_TABLET | Freq: Once | ORAL | Status: AC
  Administered 2016-05-25: 5 mg via ORAL
  Filled 2016-05-25: qty 1

## 2016-05-25 NOTE — Progress Notes (Addendum)
ANTICOAGULATION CONSULT NOTE - Follow Up Consult  Pharmacy Consult for coumadin Indication: atrial fibrillation  Allergies  Allergen Reactions  . Guaifenesin Er Palpitations    Pt can not take mucinex PM  . Prednisone Other (See Comments)    HIGH doses make patient feel "crazy"    Patient Measurements: Height: 5\' 8"  (172.7 cm) Weight: 150 lb 9.2 oz (68.3 kg) IBW/kg (Calculated) : 68.4  Vital Signs: Temp: 97.8 F (36.6 C) (06/25 0526) Temp Source: Oral (06/25 0526) BP: 170/71 mmHg (06/25 0526) Pulse Rate: 75 (06/25 0526)  Labs:  Recent Labs  05/22/16 1551 05/23/16 0447 05/24/16 0456 05/25/16 0627  HGB 10.1* 8.5*  --  8.9*  HCT 31.2* 27.3*  --  28.3*  PLT 256 225  --  284  LABPROT 32.0* 39.5* 40.3* 25.6*  INR 3.18* 4.22* 4.32* 2.37*  CREATININE 1.17 1.16  --  1.06    Estimated Creatinine Clearance: 45.6 mL/min (by C-G formula based on Cr of 1.06).   Assessment: Warfarin PTA for Afib- INR on admit 3.18>4.32 (holding warfarin for L2 biopsy Mon).  *PTA warfarin 5 mg MWF, 2.5 mg AOD (last dose 6/22)  INR today 2.37 S/p 2 doses of vitamin K PO on 6/24 and 6/25 CBC stable, no bleeding noted.   Goal of Therapy:  INR 2-3 Monitor platelets by anticoagulation protocol: Yes   Plan:  Hold tonight's coumadin dose F/U re-start after surgery Keep NPO per IR  Hep when INR < 2 per Dr. Aldona Bar C. Lennox Grumbles, PharmD Pharmacy Resident  Pager: [phone] 05/25/2016 9:45 AM

## 2016-05-25 NOTE — Progress Notes (Signed)
Patient ID: [name], male   DOB: 11/20/1924, 82 y.o.   MRN: [id number]   INR 2.37 today. Will keep npo for poss IR procedure 6/26  Will check inr in am

## 2016-05-25 NOTE — Progress Notes (Signed)
PROGRESS NOTE  [name] XTA:[id number] DOB: 02-13-1924 DOA: 05/22/2016 PCP: Merrilee Seashore, MD  Brief History:  82 year old male with a history of atrial fibrillation, TIA, hypertension, sick sinus syndrome status post PPM presented with worsening low back pain and confusion. The patient was admitted to [facility] from 03/16/2016 through 03/18/2016 after suffering a mechanical fall resulting in increasing right hip pain. Workup including CT of the hip at that time did not reveal a fracture. He was sent to physical nursing facility for rehabilitation. Unfortunately, pt continued to have significant back pain and declining mental status. As a result, the patient was brought to the emergency department. CT of the lumbar spine revealed compression fracture of L1 and burst fracture of L2 with ESR 140. The patient was seen by neurosurgery who felt that the patient was a poor surgical candidate.  Assessment/Plan: Enterococcus Bacteremia -unknown source -continue IV vancomycin D#3 pending final susceptibility -05/22/16 blood culture--enterococcus sp. -6/55/37--SMO--LM 78-67%, no embolic source -5/44/92--[id number] blood cultures--neg to date -lumbar biopsy planned 6/26 or 6/27--discussed with Dr. Estanislado Pandy  L1-L2 compression fracture -TLSO brace -Appreciate neurosurgery consultation -case discussed with Dr. Shanon Brow Jones-->concerned about ESR 140 -pain control -MR not possible due to PPM -continue oxycodone PRN pain  Acute encephalopathy -check serum B12--644 -TSH--4.22 -Ammonia--22 -continue amiodarone -Chest x-ray--neg -EKG--sinus, no concerning ST-T changes -UA--no pyuria -continue IVF as po has poor po intake -oxycodone is making pt more drowsy  Atrial fibrillation -CHADS-VASc = 5 -hold coumadin in anticipation of possible procedure -start heparin when INR <2  Coaguloapthy -INR not trending down -repeat vitamin K 5 mg x 1-6/25/17 -INR in am  CKD stage  3 -Baseline creatinine 1.0-1.2 -Check BMP  Hypothyroidism -Continue Synthroid -TSH 4.299 -recheck TSH in 4 weeks when pt more stable clinically  Hyperlipidemia -continue zocor  SSS -S/p PPM -monitor on tele  Disposition Plan: SNF in 3-4 days  Family Communication:wife and daughter updated at bedside --Total time 35 minutes. Greater than 50% face to face time--spent counseling and coordinating care  Consultants: Neurosurgery, ID  Code Status: FULL  DVT Prophylaxis: Full AC with warfarin   Procedures: As Listed in Progress Note Above  Antibiotics: None           Subjective: Patient is awake and answering questions appropriately, but falls asleep easily when not stimulated. He states that his back pain is about the same as the last couple days worsening with any significant movement. He states it is sharp in nature localized to the lower back but occasionally shooting down to his legs. He states that opioids aren't relieving his pain, but when they wear off he has severe pain. Denies any fevers, chills, chest pain, headache, shortness breath, nausea, vomiting, diarrhea, abdominal pain.  Objective: Filed Vitals:   05/24/16 1816 05/24/16 2108 05/25/16 0526 05/25/16 0800  BP: 108/49 151/64 170/71 127/61  Pulse: 61 62 75 88  Temp: 97.8 F (36.6 C) 97.6 F (36.4 C) 97.8 F (36.6 C) 98 F (36.7 C)  TempSrc: Oral Oral Oral Oral  Resp: '18 19 20 18  '$ Height:      Weight:      SpO2: 97% 97% 98% 97%    Intake/Output Summary (Last 24 hours) at 05/25/16 1455 Last data filed at 05/25/16 1114  Gross per 24 hour  Intake    590 ml  Output   2250 ml  Net  -1660 ml   Weight change:  Exam:   General:  Pt is alert,  follows commands appropriately, not in acute distress  HEENT: No icterus, No thrush, No neck mass, [place]/AT  Cardiovascular: RRR, S1/S2, no rubs, no gallops  Respiratory: CTA bilaterally, no wheezing, no crackles, no rhonchi  Abdomen: Soft/+BS,  non tender, non distended, no guarding  Extremities: No edema, No lymphangitis, No petechiae, No rashes, no synovitis   Data Reviewed: I have personally reviewed following labs and imaging studies Basic Metabolic Panel:  Recent Labs Lab 05/22/16 1551 05/23/16 0447 05/25/16 0627  NA 131* 135 137  K 4.1 4.8 3.8  CL 97* 104 103  CO2 '25 26 27  '$ GLUCOSE 158* 121* 138*  BUN '18 16 13  '$ CREATININE 1.17 1.16 1.06  CALCIUM 8.6* 8.0* 8.3*   Liver Function Tests: No results for input(s): AST, ALT, ALKPHOS, BILITOT, PROT, ALBUMIN in the last 168 hours. No results for input(s): LIPASE, AMYLASE in the last 168 hours.  Recent Labs Lab 05/23/16 0857  AMMONIA 22   Coagulation Profile:  Recent Labs Lab 05/22/16 1551 05/23/16 0447 05/24/16 0456 05/25/16 0627  INR 3.18* 4.22* 4.32* 2.37*   CBC:  Recent Labs Lab 05/22/16 1551 05/23/16 0447 05/25/16 0627  WBC 12.6* 9.0 7.8  NEUTROABS 10.6*  --   --   HGB 10.1* 8.5* 8.9*  HCT 31.2* 27.3* 28.3*  MCV 92.6 94.1 93.1  PLT 256 225 284   Cardiac Enzymes: No results for input(s): CKTOTAL, CKMB, CKMBINDEX, TROPONINI in the last 168 hours. BNP: Invalid input(s): POCBNP CBG:  Recent Labs Lab 05/24/16 1557  GLUCAP 136*   HbA1C: No results for input(s): HGBA1C in the last 72 hours. Urine analysis:    Component Value Date/Time   COLORURINE YELLOW 05/22/2016 1754   APPEARANCEUR CLEAR 05/22/2016 1754   LABSPEC 1.016 05/22/2016 1754   PHURINE 7.0 05/22/2016 1754   GLUCOSEU NEGATIVE 05/22/2016 1754   GLUCOSEU NEGATIVE 09/28/2008 1243   HGBUR NEGATIVE 05/22/2016 1754   BILIRUBINUR NEGATIVE 05/22/2016 1754   KETONESUR NEGATIVE 05/22/2016 1754   PROTEINUR NEGATIVE 05/22/2016 1754   UROBILINOGEN 0.2 mg/dL 09/28/2008 1243   NITRITE NEGATIVE 05/22/2016 1754   LEUKOCYTESUR NEGATIVE 05/22/2016 1754   Sepsis Labs: '@LABRCNTIP'$ (procalcitonin:4,lacticidven:4) ) Recent Results (from the past 240 hour(s))  Blood culture (routine x 2)      Status: Abnormal   Collection Time: 05/22/16 10:12 PM  Result Value Ref Range Status   Specimen Description BLOOD RIGHT HAND  Final   Special Requests IN PEDIATRIC BOTTLE 2ML  Final   Culture  Setup Time   Final    GRAM POSITIVE COCCI IN PAIRS IN CHAINS CRITICAL RESULT CALLED TO, READ BACK BY AND VERIFIED WITH: TValinda Party.D. 17:20 05/23/16 ([email or web address]) IN PEDIATRIC BOTTLE    Culture (A)  Final    ENTEROCOCCUS SPECIES SUSCEPTIBILITIES PERFORMED ON PREVIOUS CULTURE WITHIN THE LAST 5 DAYS.    Report Status 05/25/2016 FINAL  Final  Blood culture (routine x 2)     Status: Abnormal   Collection Time: 05/22/16 10:18 PM  Result Value Ref Range Status   Specimen Description BLOOD LEFT FOREARM  Final   Special Requests BOTTLES DRAWN AEROBIC AND ANAEROBIC 5ML  Final   Culture  Setup Time   Final    GRAM POSITIVE COCCOBACILLUS IN BOTH AEROBIC AND ANAEROBIC BOTTLES CRITICAL RESULT CALLED TO, READ BACK BY AND VERIFIED WITH: T EGAN,PHARMD AT 1720 05/23/16 BY M Maxcy    Culture ENTEROCOCCUS SPECIES (A)  Final   Report Status 05/25/2016 FINAL  Final   Organism ID, Bacteria ENTEROCOCCUS SPECIES  Final      Susceptibility   Enterococcus species - MIC*    AMPICILLIN <=2 SENSITIVE Sensitive     VANCOMYCIN 1 SENSITIVE Sensitive     GENTAMICIN SYNERGY SENSITIVE Sensitive     * ENTEROCOCCUS SPECIES  Blood Culture ID Panel (Reflexed)     Status: Abnormal   Collection Time: 05/22/16 10:18 PM  Result Value Ref Range Status   Enterococcus species DETECTED (A) NOT DETECTED Final    Comment: CRITICAL RESULT CALLED TO, READ BACK BY AND VERIFIED WITH: TValinda Party.D. 17:20 05/23/16 ([email or web address])    Vancomycin resistance NOT DETECTED NOT DETECTED Final   Listeria monocytogenes NOT DETECTED NOT DETECTED Final   Staphylococcus species NOT DETECTED NOT DETECTED Final   Staphylococcus aureus NOT DETECTED NOT DETECTED Final   Methicillin resistance NOT DETECTED NOT DETECTED Final   Streptococcus species NOT  DETECTED NOT DETECTED Final   Streptococcus agalactiae NOT DETECTED NOT DETECTED Final   Streptococcus pneumoniae NOT DETECTED NOT DETECTED Final   Streptococcus pyogenes NOT DETECTED NOT DETECTED Final   Acinetobacter baumannii NOT DETECTED NOT DETECTED Final   Enterobacteriaceae species NOT DETECTED NOT DETECTED Final   Enterobacter cloacae complex NOT DETECTED NOT DETECTED Final   Escherichia coli NOT DETECTED NOT DETECTED Final   Klebsiella oxytoca NOT DETECTED NOT DETECTED Final   Klebsiella pneumoniae NOT DETECTED NOT DETECTED Final   Proteus species NOT DETECTED NOT DETECTED Final   Serratia marcescens NOT DETECTED NOT DETECTED Final   Carbapenem resistance NOT DETECTED NOT DETECTED Final   Haemophilus influenzae NOT DETECTED NOT DETECTED Final   Neisseria meningitidis NOT DETECTED NOT DETECTED Final   Pseudomonas aeruginosa NOT DETECTED NOT DETECTED Final   Candida albicans NOT DETECTED NOT DETECTED Final   Candida glabrata NOT DETECTED NOT DETECTED Final   Candida krusei NOT DETECTED NOT DETECTED Final   Candida parapsilosis NOT DETECTED NOT DETECTED Final   Candida tropicalis NOT DETECTED NOT DETECTED Final  MRSA PCR Screening     Status: None   Collection Time: 05/23/16  1:17 AM  Result Value Ref Range Status   MRSA by PCR NEGATIVE NEGATIVE Final    Comment:        The GeneXpert MRSA Assay (FDA approved for NASAL specimens only), is one component of a comprehensive MRSA colonization surveillance program. It is not intended to diagnose MRSA infection nor to guide or monitor treatment for MRSA infections.   Culture, blood (Routine X 2) w Reflex to ID Panel     Status: None (Preliminary result)   Collection Time: 05/24/16  4:54 AM  Result Value Ref Range Status   Specimen Description BLOOD RIGHT ARM  Final   Special Requests AEROBIC BOTTLE ONLY 5ML  Final   Culture NO GROWTH 1 DAY  Final   Report Status PENDING  Incomplete  Culture, blood (Routine X 2) w Reflex to  ID Panel     Status: None (Preliminary result)   Collection Time: 05/24/16  4:56 AM  Result Value Ref Range Status   Specimen Description BLOOD RIGHT ARM  Final   Special Requests AEROBIC BOTTLE ONLY 3ML  Final   Culture NO GROWTH 1 DAY  Final   Report Status PENDING  Incomplete     Scheduled Meds: . amiodarone  100 mg Oral Daily  . levothyroxine  25 mcg Oral QAC breakfast  . simvastatin  20 mg Oral QHS  . vancomycin  750 mg Intravenous Q12H   Continuous Infusions: . sodium chloride 0.9 % 1,000  mL infusion      Procedures/Studies: Ct Head Wo Contrast  05/22/2016  CLINICAL DATA:  Back pain, leg pain EXAM: CT HEAD WITHOUT CONTRAST TECHNIQUE: Contiguous axial images were obtained from the base of the skull through the vertex without intravenous contrast. COMPARISON:  09/21/2011 FINDINGS: Brain: No evidence of acute infarction, hemorrhage, extra-axial collection, ventriculomegaly, or mass effect. There is an area of encephalomalacia in the right parietal lobe. 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 Generalized cerebral atrophy. Periventricular white matter low attenuation likely secondary to microangiopathy. Vascular: Cerebrovascular atherosclerotic calcifications are noted. Skull: Negative for fracture or focal lesion. Sinuses/Orbits: Visualized portions of the orbits are unremarkable. Visualized portions of the paranasal sinuses and mastoid air cells are unremarkable. Other: None. IMPRESSION: 1. No acute intracranial pathology. 2. Chronic microvascular disease and cerebral atrophy. Electronically Signed   By: Elige Ko   On: 05/22/2016 21:17   Ct Lumbar Spine Wo Contrast  05/22/2016  CLINICAL DATA:  Pain post fall EXAM: CT LUMBAR SPINE WITHOUT CONTRAST TECHNIQUE: Multidetector CT imaging of the lumbar spine was performed without intravenous contrast administration. Multiplanar CT image reconstructions were also generated. COMPARISON:  CT 10/26/2007 FINDINGS: Five non  rib bearing lobar is segments labeled L1-L5. T11-12: Bridging anterior endplate osteophytes. Osteopenia. Central canal and foramina patent. T12-L1: Bridging anterior osteophytes. Central canal and foramina patent. L1-2: Compression fracture deformity involving inferior endplate of L1 with a estimated 50% loss of height, more severe right than left. Additionally, there is burst fracture of L2 with greater than 50% loss of height anteriorly left greater than right. Fracture at L2 extends to involve the pedicles bilaterally, minimally displaced. Approximately 4 mm retropulsion at the level of the superior endplate of L2. There is a small amount of gas in the interspace extending up into the fractured component of L1. No unexpected soft tissue component. L2-3:  Mild disc bulge.  Mild bilateral facet DJD. L3-4:  Disc bulge.  Bilateral facet DJD.  Probable spinal stenosis. L4-5: Disc narrowing. Disc bulge. Grade 1 anterolisthesis probably secondary to the moderate facet DJD. Probable spinal stenosis. L5-S1:  Asymmetric disc narrowing.  Mild bulge.  Facet DJD. Fusion across bilateral sacroiliac joints. Patchy aortoiliac arterial calcifications without aneurysm. Cardiac pacing leads partially visualized. IMPRESSION: 1. Compression fracture of L1 and burst fracture of L2, as detailed above, probably posttraumatic. No other findings to suggest discitis or metastatic involvement. Findings were reviewed with Dr. Margo Aye, who concurs. 2. Spondylitic changes L2 to S1, with probable spinal stenosis L3-4 and L4-5. Electronically Signed   By: Corlis Leak M.D.   On: 05/22/2016 18:05   Ct Hip Right Wo Contrast  05/22/2016  CLINICAL DATA:  Fall today. Severe lower back pain and right hip pain EXAM: CT OF THE RIGHT HIP WITHOUT CONTRAST TECHNIQUE: Multidetector CT imaging of the right hip was performed according to the standard protocol. Multiplanar CT image reconstructions were also generated. COMPARISON:  03/16/2016 FINDINGS: Negative  for fracture no significant effusion. Minor spurring from the femoral head and superior acetabulum. Right superior and inferior ischiopubic rami intact. Fusion across the right SI joint. Patchy iliac arterial calcifications without aneurysm. Urinary bladder is distended. IMPRESSION: 1. Negative.  No hip fracture. Electronically Signed   By: Corlis Leak M.D.   On: 05/22/2016 18:08   Dg Chest Port 1 View  05/23/2016  CLINICAL DATA:  Low back pain, progressively worsening since fall April 2017. Cough. EXAM: PORTABLE CHEST 1 VIEW COMPARISON:  Chest CT 09/12/2013 FINDINGS: Right pacer  in place with leads in the right atrium and right ventricle. Heart is normal size. No confluent airspace opacities. Scattered aortic calcifications. No acute bony abnormality. IMPRESSION: No active cardiopulmonary disease. Aortic atherosclerosis. Electronically Signed   By: Rolm Baptise M.D.   On: 05/23/2016 09:01    Christie Copley, DO  [facility] Pager [phone]  If 7PM-7AM, please contact night-coverage [email or web address] Password [email or web address] 05/25/2016, 2:55 PM   LOS: 3 days

## 2016-05-25 NOTE — Progress Notes (Signed)
Patient ID: [name], male   DOB: 1924/08/20, 82 y.o.   MRN: [id number]         [facility]    Date of Admission:  05/22/2016           Day 3  Principal Problem:   Enterococcal bacteremia Active Problems:   Right hip pain   PACEMAKER, PERMANENT   Compression fracture of L2 lumbar vertebra with delayed healing   Hypothyroidism   Hyperlipidemia   HYPERTENSION, BENIGN ESSENTIAL   ATRIAL FIBRILLATION   SICK SINUS SYNDROME   PERIPHERAL VASCULAR DISEASE   BPH (benign prostatic hyperplasia)   Elevated C-reactive protein (CRP)   Altered mental status   Acute encephalopathy   . amiodarone  100 mg Oral Daily  . levothyroxine  25 mcg Oral QAC breakfast  . simvastatin  20 mg Oral QHS  . vancomycin  750 mg Intravenous Q12H    SUBJECTIVE: His severe back pain persists but is a little bit better after getting some pain medication recently.  Review of Systems: Review of Systems  Unable to perform ROS: mental acuity    Past Medical History  Diagnosis Date  . Atrial fibrillation (HCC)     coumadin Rx  . Sick sinus syndrome (HCC)     s/p PPM  . Ataxia     followed by Dr. Erling Cruz  . Hyperlipidemia   . Hypothyroidism   . BPH (benign prostatic hyperplasia)   . Hypertension   . TIA (transient ischemic attack)     carotid dopplers 5/11: 0-39% bilateral; follow up due 03/2012  . Peripheral neuropathy [facility])     Social History  Substance Use Topics  . Smoking status: Former Smoker    Quit date: 12/01/1977  . Smokeless tobacco: Never Used  . Alcohol Use: No     Comment: quit in 1995    Family History  Problem Relation Age of Onset  . Stroke Mother    Allergies  Allergen Reactions  . Guaifenesin Er Palpitations    Pt can not take mucinex PM  . Prednisone Other (See Comments)    HIGH doses make patient feel "crazy"    OBJECTIVE: Filed Vitals:   05/24/16 1816 05/24/16 2108 05/25/16 0526 05/25/16 0800  BP: 108/49 151/64 170/71 127/61    Pulse: 61 62 75 88  Temp: 97.8 F (36.6 C) 97.6 F (36.4 C) 97.8 F (36.6 C) 98 F (36.7 C)  TempSrc: Oral Oral Oral Oral  Resp: 18 19 20 18   Height:      Weight:      SpO2: 97% 97% 98% 97%   Body mass index is 22.9 kg/(m^2).  Physical Exam  Constitutional:  He is groggy after recent pain medication. Multiple family members at the bedside.  Cardiovascular: Normal rate and regular rhythm.   Murmur heard. Early 1/6 systolic murmur.  Pulmonary/Chest: Effort normal and breath sounds normal.    Lab Results Lab Results  Component Value Date   WBC 7.8 05/25/2016   HGB 8.9* 05/25/2016   HCT 28.3* 05/25/2016   MCV 93.1 05/25/2016   PLT 284 05/25/2016    Lab Results  Component Value Date   CREATININE 1.06 05/25/2016   BUN 13 05/25/2016   NA 137 05/25/2016   K 3.8 05/25/2016   CL 103 05/25/2016   CO2 27 05/25/2016    Lab Results  Component Value Date   ALT 12 10/06/2011   AST 18 10/06/2011   ALKPHOS 64 10/06/2011   BILITOT  0.4 10/06/2011     Microbiology: Recent Results (from the past 240 hour(s))  Blood culture (routine x 2)     Status: Abnormal   Collection Time: 05/22/16 10:12 PM  Result Value Ref Range Status   Specimen Description BLOOD RIGHT HAND  Final   Special Requests IN PEDIATRIC BOTTLE 2ML  Final   Culture  Setup Time   Final    GRAM POSITIVE COCCI IN PAIRS IN CHAINS CRITICAL RESULT CALLED TO, READ BACK BY AND VERIFIED WITH: TValinda Party.D. 17:20 05/23/16 ([email or web address]) IN PEDIATRIC BOTTLE    Culture (A)  Final    ENTEROCOCCUS SPECIES SUSCEPTIBILITIES PERFORMED ON PREVIOUS CULTURE WITHIN THE LAST 5 DAYS.    Report Status 05/25/2016 FINAL  Final  Blood culture (routine x 2)     Status: Abnormal   Collection Time: 05/22/16 10:18 PM  Result Value Ref Range Status   Specimen Description BLOOD LEFT FOREARM  Final   Special Requests BOTTLES DRAWN AEROBIC AND ANAEROBIC 5ML  Final   Culture  Setup Time   Final    GRAM POSITIVE COCCOBACILLUS IN BOTH  AEROBIC AND ANAEROBIC BOTTLES CRITICAL RESULT CALLED TO, READ BACK BY AND VERIFIED WITH: T EGAN,PHARMD AT 1720 05/23/16 BY M Gaspar    Culture ENTEROCOCCUS SPECIES (A)  Final   Report Status 05/25/2016 FINAL  Final   Organism ID, Bacteria ENTEROCOCCUS SPECIES  Final      Susceptibility   Enterococcus species - MIC*    AMPICILLIN <=2 SENSITIVE Sensitive     VANCOMYCIN 1 SENSITIVE Sensitive     GENTAMICIN SYNERGY SENSITIVE Sensitive     * ENTEROCOCCUS SPECIES  Blood Culture ID Panel (Reflexed)     Status: Abnormal   Collection Time: 05/22/16 10:18 PM  Result Value Ref Range Status   Enterococcus species DETECTED (A) NOT DETECTED Final    Comment: CRITICAL RESULT CALLED TO, READ BACK BY AND VERIFIED WITH: TValinda Party.D. 17:20 05/23/16 ([email or web address])    Vancomycin resistance NOT DETECTED NOT DETECTED Final   Listeria monocytogenes NOT DETECTED NOT DETECTED Final   Staphylococcus species NOT DETECTED NOT DETECTED Final   Staphylococcus aureus NOT DETECTED NOT DETECTED Final   Methicillin resistance NOT DETECTED NOT DETECTED Final   Streptococcus species NOT DETECTED NOT DETECTED Final   Streptococcus agalactiae NOT DETECTED NOT DETECTED Final   Streptococcus pneumoniae NOT DETECTED NOT DETECTED Final   Streptococcus pyogenes NOT DETECTED NOT DETECTED Final   Acinetobacter baumannii NOT DETECTED NOT DETECTED Final   Enterobacteriaceae species NOT DETECTED NOT DETECTED Final   Enterobacter cloacae complex NOT DETECTED NOT DETECTED Final   Escherichia coli NOT DETECTED NOT DETECTED Final   Klebsiella oxytoca NOT DETECTED NOT DETECTED Final   Klebsiella pneumoniae NOT DETECTED NOT DETECTED Final   Proteus species NOT DETECTED NOT DETECTED Final   Serratia marcescens NOT DETECTED NOT DETECTED Final   Carbapenem resistance NOT DETECTED NOT DETECTED Final   Haemophilus influenzae NOT DETECTED NOT DETECTED Final   Neisseria meningitidis NOT DETECTED NOT DETECTED Final   Pseudomonas aeruginosa  NOT DETECTED NOT DETECTED Final   Candida albicans NOT DETECTED NOT DETECTED Final   Candida glabrata NOT DETECTED NOT DETECTED Final   Candida krusei NOT DETECTED NOT DETECTED Final   Candida parapsilosis NOT DETECTED NOT DETECTED Final   Candida tropicalis NOT DETECTED NOT DETECTED Final  MRSA PCR Screening     Status: None   Collection Time: 05/23/16  1:17 AM  Result Value Ref Range Status   MRSA by  PCR NEGATIVE NEGATIVE Final    Comment:        The GeneXpert MRSA Assay (FDA approved for NASAL specimens only), is one component of a comprehensive MRSA colonization surveillance program. It is not intended to diagnose MRSA infection nor to guide or monitor treatment for MRSA infections.   Culture, blood (Routine X 2) w Reflex to ID Panel     Status: None (Preliminary result)   Collection Time: 05/24/16  4:54 AM  Result Value Ref Range Status   Specimen Description BLOOD RIGHT ARM  Final   Special Requests AEROBIC BOTTLE ONLY 5ML  Final   Culture NO GROWTH 1 DAY  Final   Report Status PENDING  Incomplete  Culture, blood (Routine X 2) w Reflex to ID Panel     Status: None (Preliminary result)   Collection Time: 05/24/16  4:56 AM  Result Value Ref Range Status   Specimen Description BLOOD RIGHT ARM  Final   Special Requests AEROBIC BOTTLE ONLY 3ML  Final   Culture NO GROWTH 1 DAY  Final   Report Status PENDING  Incomplete     ASSESSMENT: He is afebrile and repeat blood cultures are negative at 24 hours. I will continue vancomycin for enterococcal bacteremia. No vegetations were seen on transthoracic echocardiogram. There was no mention of the pacemaker wires in the right ventricle. He is scheduled for lumbar aspirate tomorrow.  PLAN: 1. Continue vancomycin 2. Hold off on PICC placement until blood cultures are negative 3. Lumbar aspirate tomorrow  Michel Bickers, MD [place] [facility] [place] [phone] pager   [phone] cell 05/25/2016,  2:30 PM

## 2016-05-26 ENCOUNTER — Inpatient Hospital Stay (HOSPITAL_COMMUNITY): Payer: Medicare Other

## 2016-05-26 DIAGNOSIS — R5381 Other malaise: Secondary | ICD-10-CM

## 2016-05-26 LAB — BASIC METABOLIC PANEL
ANION GAP: 6 (ref 5–15)
BUN: 10 mg/dL (ref 6–20)
CHLORIDE: 100 mmol/L — AB (ref 101–111)
CO2: 28 mmol/L (ref 22–32)
Calcium: 8 mg/dL — ABNORMAL LOW (ref 8.9–10.3)
Creatinine, Ser: 1.06 mg/dL (ref 0.61–1.24)
Glucose, Bld: 114 mg/dL — ABNORMAL HIGH (ref 65–99)
POTASSIUM: 4 mmol/L (ref 3.5–5.1)
SODIUM: 134 mmol/L — AB (ref 135–145)

## 2016-05-26 LAB — FOLATE RBC
FOLATE, HEMOLYSATE: 392.5 ng/mL
Folate, RBC: 1335 ng/mL (ref >498)
Hematocrit: 29.4 % — ABNORMAL LOW (ref 37.5–51.0)

## 2016-05-26 LAB — PROTIME-INR
INR: 1.35 (ref 0.00–1.49)
PROTHROMBIN TIME: 16.8 s — AB (ref 11.6–15.2)

## 2016-05-26 LAB — VANCOMYCIN, TROUGH: VANCOMYCIN TR: 25 ug/mL — AB (ref 10.0–20.0)

## 2016-05-26 MED ORDER — VANCOMYCIN HCL IN DEXTROSE 1-5 GM/200ML-% IV SOLN
1000.0000 mg | INTRAVENOUS | Status: DC
  Filled 2016-05-26: qty 200

## 2016-05-26 MED ORDER — FENTANYL CITRATE (PF) 100 MCG/2ML IJ SOLN
INTRAMUSCULAR | Status: AC
  Filled 2016-05-26: qty 2

## 2016-05-26 MED ORDER — MIDAZOLAM HCL 2 MG/2ML IJ SOLN
INTRAMUSCULAR | Status: AC | PRN
  Administered 2016-05-26: 1 mg via INTRAVENOUS
  Administered 2016-05-26: 0.5 mg via INTRAVENOUS

## 2016-05-26 MED ORDER — FENTANYL CITRATE (PF) 100 MCG/2ML IJ SOLN
INTRAMUSCULAR | Status: AC | PRN
  Administered 2016-05-26 (×2): 25 ug via INTRAVENOUS

## 2016-05-26 MED ORDER — BUPIVACAINE HCL (PF) 0.25 % IJ SOLN
INTRAMUSCULAR | Status: AC
  Administered 2016-05-26: 15 mL
  Filled 2016-05-26: qty 30

## 2016-05-26 MED ORDER — MIDAZOLAM HCL 2 MG/2ML IJ SOLN
INTRAMUSCULAR | Status: AC
  Filled 2016-05-26: qty 2

## 2016-05-26 MED ORDER — DEXTROSE 5 % IV SOLN
2.0000 g | Freq: Two times a day (BID) | INTRAVENOUS | Status: DC
  Administered 2016-05-26 – 2016-05-29 (×6): 2 g via INTRAVENOUS
  Filled 2016-05-26 (×8): qty 2

## 2016-05-26 MED ORDER — SODIUM CHLORIDE 0.9 % IV SOLN
2.0000 g | Freq: Four times a day (QID) | INTRAVENOUS | Status: DC
  Administered 2016-05-26 – 2016-06-02 (×25): 2 g via INTRAVENOUS
  Filled 2016-05-26 (×30): qty 2000

## 2016-05-26 MED ORDER — HEPARIN (PORCINE) IN NACL 100-0.45 UNIT/ML-% IJ SOLN
950.0000 [IU]/h | INTRAMUSCULAR | Status: DC
  Administered 2016-05-26: 950 [IU]/h via INTRAVENOUS
  Filled 2016-05-26: qty 250

## 2016-05-26 MED ORDER — AMPICILLIN SODIUM 2 G IJ SOLR
2.0000 g | Freq: Four times a day (QID) | INTRAMUSCULAR | Status: DC
  Filled 2016-05-26 (×2): qty 2000

## 2016-05-26 MED ORDER — HEPARIN (PORCINE) IN NACL 100-0.45 UNIT/ML-% IJ SOLN
1100.0000 [IU]/h | INTRAMUSCULAR | Status: DC
  Administered 2016-05-27 – 2016-05-31 (×5): 1100 [IU]/h via INTRAVENOUS
  Filled 2016-05-26 (×6): qty 250

## 2016-05-26 MED ORDER — HEPARIN (PORCINE) IN NACL 100-0.45 UNIT/ML-% IJ SOLN
950.0000 [IU]/h | INTRAMUSCULAR | Status: DC
  Filled 2016-05-26: qty 250

## 2016-05-26 NOTE — Progress Notes (Signed)
Heparin drip stopped at 1310 pm per instruction from IR; pharmacy notified.

## 2016-05-26 NOTE — Progress Notes (Signed)
ANTICOAGULATION CONSULT NOTE - Follow Up Consult  Pharmacy Consult for warfarin>>heparin Indication: atrial fibrillation  Allergies  Allergen Reactions  . Guaifenesin Er Palpitations    Pt can not take mucinex PM  . Prednisone Other (See Comments)    HIGH doses make patient feel "crazy"    Patient Measurements: Height: 5\' 8"  (172.7 cm) Weight: 150 lb 9.2 oz (68.3 kg) IBW/kg (Calculated) : 68.4 Heparin Dosing Weight: 68.3 kg  Vital Signs: Temp: 98.2 F (36.8 C) (06/26 0112) Temp Source: Oral (06/26 0112) BP: 158/84 mmHg (06/26 0112) Pulse Rate: 81 (06/26 0112)  Labs:  Recent Labs  05/24/16 0456 05/25/16 0627 05/26/16 0546  HGB  --  8.9*  --   HCT  --  28.3*  --   PLT  --  284  --   LABPROT 40.3* 25.6* 16.8*  INR 4.32* 2.37* 1.35  CREATININE  --  1.06 1.06    Estimated Creatinine Clearance: 45.6 mL/min (by C-G formula based on Cr of 1.06).  Assessment: 89 yoM with hx AFib on chronic anticoagulation with warfarin. PTA warfarin dose 5 mg MWF, 2.5 mg all other days (last dose PTA 6/22) Holding warfarin for possible procedure and pt now s/p vitamin K PO on 6/24 and 6/25 INR today 1.35, Heparin to start when INR <2  Goal of Therapy:  INR 2-3 Heparin level 0.3-0.7 units/ml Monitor platelets by anticoagulation protocol: Yes   Plan:  Start heparin infusion at 950 units/hr Check anti-Xa level in 8 hours and daily while on heparin Continue to monitor H&H, and platelets  Continue holding warfarin today and f/u restart Monitor daily INR, CBC, clinical course, s/sx of bleed, PO intake, DDI   Thank you for allowing [place] to participate in this patients care. Jens Som, PharmD Pager: [phone] 05/26/2016,8:43 AM

## 2016-05-26 NOTE — Sedation Documentation (Signed)
Bandaid mid back intact

## 2016-05-26 NOTE — Procedures (Signed)
S/p L2 fluoro guided biosy x 2 passes

## 2016-05-26 NOTE — Sedation Documentation (Signed)
Patient is resting comfortably. 

## 2016-05-26 NOTE — Care Management Important Message (Signed)
Important Message  Patient Details  Name: [name] MRN: [id number] Date of Birth: [birth year]   Medicare Important Message Given:  Yes    [name] 05/26/2016, 8:54 AM

## 2016-05-26 NOTE — Progress Notes (Signed)
[place] for Infectious Disease    Date of Admission:  05/22/2016   Total days of antibiotics 5        Day 5 vanco           ID: [name] is a 82 y.o. male  with a history of atrial fibrillation, TIA, hypertension, sick sinus syndrome status post PPM presented with worsening low back pain and confusion. The patient was admitted to [facility] from 03/16/2016 through 03/18/2016 after suffering a mechanical fall resulting in increasing right hip pain. Workup including CT of the hip at that time did not reveal a fracture. He was sent to physical nursing facility for rehabilitation. Unfortunately, pt continued to have significant back pain and declining mental status. As a result, the patient was brought to the emergency department. CT of the lumbar spine revealed compression fracture of L1 and burst fracture of L2 with ESR 140. the patient also was last treated for UTI roughly 10-14days ago, flanked by 2 courses of azithromycin on either side for bronchitis. He is found to have enterococcal bacteremia  Principal Problem:   Enterococcal bacteremia Active Problems:   Hypothyroidism   Hyperlipidemia   HYPERTENSION, BENIGN ESSENTIAL   ATRIAL FIBRILLATION   SICK SINUS SYNDROME   PERIPHERAL VASCULAR DISEASE   BPH (benign prostatic hyperplasia)   PACEMAKER, PERMANENT   Right hip pain   Elevated C-reactive protein (CRP)   Altered mental status   Compression fracture of L2 lumbar vertebra with delayed healing   Acute encephalopathy    Subjective: Feeling better than yesterday. Still has back pain  Family reports he has been bed bound for 14 days concern about deconditioning  Medications:  . amiodarone  100 mg Oral Daily  . ampicillin (OMNIPEN) IV  2 g Intravenous Q6H  . cefTRIAXone (ROCEPHIN)  IV  2 g Intravenous Q12H  . fentaNYL      . levothyroxine  25 mcg Oral QAC breakfast  . midazolam      . simvastatin  20 mg Oral QHS    Objective: Vital signs in last 24 hours: Temp:   [98.1 F (36.7 C)-98.3 F (36.8 C)] 98.3 F (36.8 C) (06/26 1259) Pulse Rate:  [64-81] 76 (06/26 1555) Resp:  [10-20] 13 (06/26 1555) BP: (130-173)/(7-114) 147/85 mmHg (06/26 1555) SpO2:  [96 %-100 %] 98 % (06/26 1555) Physical Exam  Constitutional: He is oriented to person, place, and time. He appears well-developed and well-nourished. No distress.  HENT:  Mouth/Throat: Oropharynx is clear and moist. No oropharyngeal exudate.  Cardiovascular: Normal rate, regular rhythm and normal heart sounds.+ SEM BH RUSB Pulmonary/Chest: Effort normal and breath sounds normal. No respiratory distress. He has no wheezes.  Abdominal: Soft. Bowel sounds are normal. He exhibits no distension. There is no tenderness.  Lymphadenopathy:  He has no cervical adenopathy.  Neurological: He is alert and oriented to person, place, and time.  Skin: Skin is warm and dry. No rash noted. No erythema.  Psychiatric: He has a normal mood and affect. His behavior is normal.    Lab Results  Recent Labs  05/25/16 0627 05/26/16 0546  WBC 7.8  --   HGB 8.9*  --   HCT 28.3*  --   NA 137 134*  K 3.8 4.0  CL 103 100*  CO2 27 28  BUN 13 10  CREATININE 1.06 1.06   Lab Results  Component Value Date   ESRSEDRATE >140* 05/23/2016   Lab Results  Component Value Date  CRP 16.1* 05/23/2016    Microbiology: 6/22 blood cx enterococcus amp S 6/24 blood cx pending Studies/Results: cxr presence of Right sided leads  Assessment/Plan: Enterococcal bacteremia  Concern for osteomyelitis vs. Cardiac device infection = please have cardiology do TEE. For now will do dual therapy with ampicillin and ceftriaxone. Will d/c vancomycin.  Lumbar fracture = he appears to have ground level fall in mid April but unclear if also compounded by spontaneous fracture and whether this is possible osteomyelitis. Continue with current regimen of iv abtx  Decondition = please have pt /ot work with patient as he tolerates it  [employer],  [facility] for Infectious Diseases Cell: [phone] Pager: [phone]  05/26/2016, 5:59 PM

## 2016-05-26 NOTE — Progress Notes (Signed)
PROGRESS NOTE  [name] OTL:[id number] DOB: 03/07/1924 DOA: 05/22/2016 PCP: Merrilee Seashore, MD Brief History:  82 year old male with a history of atrial fibrillation, TIA, hypertension, sick sinus syndrome status post PPM presented with worsening low back pain and confusion. The patient was admitted to [facility] from 03/16/2016 through 03/18/2016 after suffering a mechanical fall resulting in increasing right hip pain. Workup including CT of the hip at that time did not reveal a fracture. He was sent to physical nursing facility for rehabilitation. Unfortunately, pt continued to have significant back pain and declining mental status. As a result, the patient was brought to the emergency department. CT of the lumbar spine revealed compression fracture of L1 and burst fracture of L2 with ESR 140. The patient was seen by neurosurgery who felt that the patient was a poor surgical candidate.  Assessment/Plan: Enterococcus Bacteremia -unknown source -IV vanco-->ampicillin on 6/26 (abx D#4) -05/22/16 blood culture--enterococcus sp. -9/74/16--LAG--TX 64-68%, no embolic source -0/32/12--[id number] blood cultures--neg to date -05/26/16--L2 biopsy-->sent for culture and pathology  L1-L2 compression fracture -TLSO brace -Appreciate neurosurgery consultation -case discussed with Dr. Shanon Brow Jones-->concerned about ESR 140 -pain control -MR not possible due to PPM -continue oxycodone PRN pain -05/26/16--L2 biopsy-->sent for culture and pathology  Acute encephalopathy -check serum B12--644 -TSH--4.22 -Ammonia--22 -continue amiodarone -Chest x-ray--neg -EKG--sinus, no concerning ST-T changes -UA--no pyuria -continue IVF as po has poor po intake -oxycodone is making pt more drowsy  Atrial fibrillation -CHADS-VASc = 5 -restart intravenous heparin at 7 pm 04/25/2016--dicussed with Dr. Estanislado Pandy -will not restart warfarin until 6/27  Coaguloapthy -INR not trending down -received  vitamin K 5 mg x 2 doses for biopsy  CKD stage 3 -Baseline creatinine 1.0-1.2 -Check BMP  Hypothyroidism -Continue Synthroid -TSH 4.299 -recheck TSH in 4 weeks when pt more stable clinically  Hyperlipidemia -continue zocor  SSS -S/p PPM -monitor on tele  Goals of care -discussed with family -05/26/16--change pt to DNR  Disposition Plan: SNF in 3-4 days  Family Communication:wife and daughter updated at bedside 6/26 --Total time 35 minutes. Greater than 50% face to face time--spent counseling and coordinating care  Consultants: Neurosurgery, ID, IR  Code Status: DNR  DVT Prophylaxis: Full AC with warfarin   Procedures: As Listed in Progress Note Above  Antibiotics: Vancomycin 6/23-->6/26 Ampicillin 6/26>>>   Subjective: Patient complains of back pain is not much better than last several days. He states it is sharp in nature and worse with any type of movement. He states it is in his lower back.  Objective: Filed Vitals:   05/26/16 1544 05/26/16 1546 05/26/16 1550 05/26/16 1555  BP: 173/94 138/84 147/85 147/85  Pulse: 70 72 69 76  Temp:      TempSrc:      Resp: '10 12 10 13  '$ Height:      Weight:      SpO2: 100% 100% 99% 98%    Intake/Output Summary (Last 24 hours) at 05/26/16 1733 Last data filed at 05/26/16 0933  Gross per 24 hour  Intake      0 ml  Output   1300 ml  Net  -1300 ml   Weight change:  Exam:   General:  Pt is alert, follows commands appropriately, not in acute distress  HEENT: No icterus, No thrush, No neck mass, [place]/AT  Cardiovascular: RRR, S1/S2, no rubs, no gallops  Respiratory: diminished breath sound bilateral buttocks with auscultation. No wheezing  Abdomen: Soft/+BS, non tender, non distended, no guarding  Extremities: No edema, No lymphangitis, No petechiae, No rashes, no synovitis   Data Reviewed: I have personally reviewed following labs and imaging studies Basic Metabolic Panel:  Recent Labs Lab  05/22/16 1551 05/23/16 0447 05/25/16 0627 05/26/16 0546  NA 131* 135 137 134*  K 4.1 4.8 3.8 4.0  CL 97* 104 103 100*  CO2 '25 26 27 28  '$ GLUCOSE 158* 121* 138* 114*  BUN '18 16 13 10  '$ CREATININE 1.17 1.16 1.06 1.06  CALCIUM 8.6* 8.0* 8.3* 8.0*   Liver Function Tests: No results for input(s): AST, ALT, ALKPHOS, BILITOT, PROT, ALBUMIN in the last 168 hours. No results for input(s): LIPASE, AMYLASE in the last 168 hours.  Recent Labs Lab 05/23/16 0857  AMMONIA 22   Coagulation Profile:  Recent Labs Lab 05/22/16 1551 05/23/16 0447 05/24/16 0456 05/25/16 0627 05/26/16 0546  INR 3.18* 4.22* 4.32* 2.37* 1.35   CBC:  Recent Labs Lab 05/22/16 1551 05/23/16 0447 05/23/16 0857 05/25/16 0627  WBC 12.6* 9.0  --  7.8  NEUTROABS 10.6*  --   --   --   HGB 10.1* 8.5*  --  8.9*  HCT 31.2* 27.3* 29.4* 28.3*  MCV 92.6 94.1  --  93.1  PLT 256 225  --  284   Cardiac Enzymes: No results for input(s): CKTOTAL, CKMB, CKMBINDEX, TROPONINI in the last 168 hours. BNP: Invalid input(s): POCBNP CBG:  Recent Labs Lab 05/24/16 1557  GLUCAP 136*   HbA1C: No results for input(s): HGBA1C in the last 72 hours. Urine analysis:    Component Value Date/Time   COLORURINE YELLOW 05/22/2016 1754   APPEARANCEUR CLEAR 05/22/2016 1754   LABSPEC 1.016 05/22/2016 1754   PHURINE 7.0 05/22/2016 1754   GLUCOSEU NEGATIVE 05/22/2016 1754   GLUCOSEU NEGATIVE 09/28/2008 1243   HGBUR NEGATIVE 05/22/2016 1754   BILIRUBINUR NEGATIVE 05/22/2016 1754   KETONESUR NEGATIVE 05/22/2016 1754   PROTEINUR NEGATIVE 05/22/2016 1754   UROBILINOGEN 0.2 mg/dL 09/28/2008 1243   NITRITE NEGATIVE 05/22/2016 1754   LEUKOCYTESUR NEGATIVE 05/22/2016 1754   Sepsis Labs: '@LABRCNTIP'$ (procalcitonin:4,lacticidven:4) ) Recent Results (from the past 240 hour(s))  Blood culture (routine x 2)     Status: Abnormal   Collection Time: 05/22/16 10:12 PM  Result Value Ref Range Status   Specimen Description BLOOD RIGHT HAND   Final   Special Requests IN PEDIATRIC BOTTLE 2ML  Final   Culture  Setup Time   Final    GRAM POSITIVE COCCI IN PAIRS IN CHAINS CRITICAL RESULT CALLED TO, READ BACK BY AND VERIFIED WITH: TValinda Party.D. 17:20 05/23/16 ([email or web address]) IN PEDIATRIC BOTTLE    Culture (A)  Final    ENTEROCOCCUS SPECIES SUSCEPTIBILITIES PERFORMED ON PREVIOUS CULTURE WITHIN THE LAST 5 DAYS.    Report Status 05/25/2016 FINAL  Final  Blood culture (routine x 2)     Status: Abnormal   Collection Time: 05/22/16 10:18 PM  Result Value Ref Range Status   Specimen Description BLOOD LEFT FOREARM  Final   Special Requests BOTTLES DRAWN AEROBIC AND ANAEROBIC 5ML  Final   Culture  Setup Time   Final    GRAM POSITIVE COCCOBACILLUS IN BOTH AEROBIC AND ANAEROBIC BOTTLES CRITICAL RESULT CALLED TO, READ BACK BY AND VERIFIED WITH: T EGAN,PHARMD AT 1720 05/23/16 BY M Sider    Culture ENTEROCOCCUS SPECIES (A)  Final   Report Status 05/25/2016 FINAL  Final   Organism ID, Bacteria ENTEROCOCCUS SPECIES  Final      Susceptibility   Enterococcus species - MIC*  AMPICILLIN <=2 SENSITIVE Sensitive     VANCOMYCIN 1 SENSITIVE Sensitive     GENTAMICIN SYNERGY SENSITIVE Sensitive     * ENTEROCOCCUS SPECIES  Blood Culture ID Panel (Reflexed)     Status: Abnormal   Collection Time: 05/22/16 10:18 PM  Result Value Ref Range Status   Enterococcus species DETECTED (A) NOT DETECTED Final    Comment: CRITICAL RESULT CALLED TO, READ BACK BY AND VERIFIED WITH: TValinda Party.D. 17:20 05/23/16 ([email or web address])    Vancomycin resistance NOT DETECTED NOT DETECTED Final   Listeria monocytogenes NOT DETECTED NOT DETECTED Final   Staphylococcus species NOT DETECTED NOT DETECTED Final   Staphylococcus aureus NOT DETECTED NOT DETECTED Final   Methicillin resistance NOT DETECTED NOT DETECTED Final   Streptococcus species NOT DETECTED NOT DETECTED Final   Streptococcus agalactiae NOT DETECTED NOT DETECTED Final   Streptococcus pneumoniae NOT DETECTED NOT  DETECTED Final   Streptococcus pyogenes NOT DETECTED NOT DETECTED Final   Acinetobacter baumannii NOT DETECTED NOT DETECTED Final   Enterobacteriaceae species NOT DETECTED NOT DETECTED Final   Enterobacter cloacae complex NOT DETECTED NOT DETECTED Final   Escherichia coli NOT DETECTED NOT DETECTED Final   Klebsiella oxytoca NOT DETECTED NOT DETECTED Final   Klebsiella pneumoniae NOT DETECTED NOT DETECTED Final   Proteus species NOT DETECTED NOT DETECTED Final   Serratia marcescens NOT DETECTED NOT DETECTED Final   Carbapenem resistance NOT DETECTED NOT DETECTED Final   Haemophilus influenzae NOT DETECTED NOT DETECTED Final   Neisseria meningitidis NOT DETECTED NOT DETECTED Final   Pseudomonas aeruginosa NOT DETECTED NOT DETECTED Final   Candida albicans NOT DETECTED NOT DETECTED Final   Candida glabrata NOT DETECTED NOT DETECTED Final   Candida krusei NOT DETECTED NOT DETECTED Final   Candida parapsilosis NOT DETECTED NOT DETECTED Final   Candida tropicalis NOT DETECTED NOT DETECTED Final  MRSA PCR Screening     Status: None   Collection Time: 05/23/16  1:17 AM  Result Value Ref Range Status   MRSA by PCR NEGATIVE NEGATIVE Final    Comment:        The GeneXpert MRSA Assay (FDA approved for NASAL specimens only), is one component of a comprehensive MRSA colonization surveillance program. It is not intended to diagnose MRSA infection nor to guide or monitor treatment for MRSA infections.   Culture, blood (Routine X 2) w Reflex to ID Panel     Status: None (Preliminary result)   Collection Time: 05/24/16  4:54 AM  Result Value Ref Range Status   Specimen Description BLOOD RIGHT ARM  Final   Special Requests AEROBIC BOTTLE ONLY 5ML  Final   Culture NO GROWTH 2 DAYS  Final   Report Status PENDING  Incomplete  Culture, blood (Routine X 2) w Reflex to ID Panel     Status: None (Preliminary result)   Collection Time: 05/24/16  4:56 AM  Result Value Ref Range Status   Specimen  Description BLOOD RIGHT ARM  Final   Special Requests AEROBIC BOTTLE ONLY 3ML  Final   Culture NO GROWTH 2 DAYS  Final   Report Status PENDING  Incomplete     Scheduled Meds: . amiodarone  100 mg Oral Daily  . ampicillin (OMNIPEN) IV  2 g Intravenous Q6H  . fentaNYL      . levothyroxine  25 mcg Oral QAC breakfast  . midazolam      . simvastatin  20 mg Oral QHS   Continuous Infusions: . [START ON 05/27/2016] heparin    .  sodium chloride 0.9 % 1,000 mL infusion      Procedures/Studies: Ct Head Wo Contrast  05/22/2016  CLINICAL DATA:  Back pain, leg pain EXAM: CT HEAD WITHOUT CONTRAST TECHNIQUE: Contiguous axial images were obtained from the base of the skull through the vertex without intravenous contrast. COMPARISON:  09/21/2011 FINDINGS: Brain: No evidence of acute infarction, hemorrhage, extra-axial collection, ventriculomegaly, or mass effect. There is an area of encephalomalacia in the right parietal lobe. 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 Generalized cerebral atrophy. Periventricular white matter low attenuation likely secondary to microangiopathy. Vascular: Cerebrovascular atherosclerotic calcifications are noted. Skull: Negative for fracture or focal lesion. Sinuses/Orbits: Visualized portions of the orbits are unremarkable. Visualized portions of the paranasal sinuses and mastoid air cells are unremarkable. Other: None. IMPRESSION: 1. No acute intracranial pathology. 2. Chronic microvascular disease and cerebral atrophy. Electronically Signed   By: Kathreen Devoid   On: 05/22/2016 21:17   Ct Lumbar Spine Wo Contrast  05/22/2016  CLINICAL DATA:  Pain post fall EXAM: CT LUMBAR SPINE WITHOUT CONTRAST TECHNIQUE: Multidetector CT imaging of the lumbar spine was performed without intravenous contrast administration. Multiplanar CT image reconstructions were also generated. COMPARISON:  CT 10/26/2007 FINDINGS: Five non rib bearing lobar is segments labeled L1-L5.  T11-12: Bridging anterior endplate osteophytes. Osteopenia. Central canal and foramina patent. T12-L1: Bridging anterior osteophytes. Central canal and foramina patent. L1-2: Compression fracture deformity involving inferior endplate of L1 with a estimated 50% loss of height, more severe right than left. Additionally, there is burst fracture of L2 with greater than 50% loss of height anteriorly left greater than right. Fracture at L2 extends to involve the pedicles bilaterally, minimally displaced. Approximately 4 mm retropulsion at the level of the superior endplate of L2. There is a small amount of gas in the interspace extending up into the fractured component of L1. No unexpected soft tissue component. L2-3:  Mild disc bulge.  Mild bilateral facet DJD. L3-4:  Disc bulge.  Bilateral facet DJD.  Probable spinal stenosis. L4-5: Disc narrowing. Disc bulge. Grade 1 anterolisthesis probably secondary to the moderate facet DJD. Probable spinal stenosis. L5-S1:  Asymmetric disc narrowing.  Mild bulge.  Facet DJD. Fusion across bilateral sacroiliac joints. Patchy aortoiliac arterial calcifications without aneurysm. Cardiac pacing leads partially visualized. IMPRESSION: 1. Compression fracture of L1 and burst fracture of L2, as detailed above, probably posttraumatic. No other findings to suggest discitis or metastatic involvement. Findings were reviewed with Dr. Nevada Crane, who concurs. 2. Spondylitic changes L2 to S1, with probable spinal stenosis L3-4 and L4-5. Electronically Signed   By: Lucrezia Europe M.D.   On: 05/22/2016 18:05   Ct Hip Right Wo Contrast  05/22/2016  CLINICAL DATA:  Fall today. Severe lower back pain and right hip pain EXAM: CT OF THE RIGHT HIP WITHOUT CONTRAST TECHNIQUE: Multidetector CT imaging of the right hip was performed according to the standard protocol. Multiplanar CT image reconstructions were also generated. COMPARISON:  03/16/2016 FINDINGS: Negative for fracture no significant effusion. Minor  spurring from the femoral head and superior acetabulum. Right superior and inferior ischiopubic rami intact. Fusion across the right SI joint. Patchy iliac arterial calcifications without aneurysm. Urinary bladder is distended. IMPRESSION: 1. Negative.  No hip fracture. Electronically Signed   By: Lucrezia Europe M.D.   On: 05/22/2016 18:08   Dg Chest Port 1 View  05/23/2016  CLINICAL DATA:  Low back pain, progressively worsening since fall April 2017. Cough. EXAM: PORTABLE CHEST 1 VIEW COMPARISON:  Chest  CT 09/12/2013 FINDINGS: Right pacer in place with leads in the right atrium and right ventricle. Heart is normal size. No confluent airspace opacities. Scattered aortic calcifications. No acute bony abnormality. IMPRESSION: No active cardiopulmonary disease. Aortic atherosclerosis. Electronically Signed   By: Rolm Baptise M.D.   On: 05/23/2016 09:01    Nastasia Kage, DO  [facility] Pager [phone]  If 7PM-7AM, please contact night-coverage [email or web address] Password [facility] 05/26/2016, 5:33 PM   LOS: 4 days

## 2016-05-26 NOTE — Progress Notes (Signed)
Pharmacy Antibiotic Note  Harper Kise is a 82 y.o. male admitted on 05/22/2016 with bacteremia and sepsis.  Pharmacy has been consulted for Vancomycin dosing.  Vancomycin trough is elevated this AM at 25  Plan: -Reduce vancomycin to 1000 mg IV q24h -Re-check vancomycin trough at steady state  Height: 5\' 8"  (172.7 cm) Weight: 150 lb 9.2 oz (68.3 kg) IBW/kg (Calculated) : 68.4  Temp (24hrs), Avg:98.3 F (36.8 C), Min:98 F (36.7 C), Max:98.7 F (37.1 C)   Recent Labs Lab 05/22/16 1551 05/23/16 0447 05/25/16 0627 05/26/16 0546  WBC 12.6* 9.0 7.8  --   CREATININE 1.17 1.16 1.06 1.06  VANCOTROUGH  --   --   --  25*    Estimated Creatinine Clearance: 45.6 mL/min (by C-G formula based on Cr of 1.06).    Allergies  Allergen Reactions  . Guaifenesin Er Palpitations    Pt can not take mucinex PM  . Prednisone Other (See Comments)    HIGH doses make patient feel "crazy"    Narda Bonds 05/26/2016 7:08 AM

## 2016-05-26 NOTE — Care Management Note (Signed)
Case Management Note  Patient Details  Name: [name] MRN: [id number] Date of Birth: 01/10/1924  Subjective/Objective:                    Action/Plan: Pt with enterococcal bacteremia. He is on IV vanc and plan is for Lumbar biopsy today. CM following for discharge needs.   Expected Discharge Date:                  Expected Discharge Plan:  Skilled Nursing Facility  In-House Referral:  Clinical Social Work  Discharge planning Services  CM Consult  Post Acute Care Choice:    Choice offered to:     DME Arranged:    DME Agency:     HH Arranged:    [place] Agency:     Status of Service:  In process, will continue to follow  If discussed at Long Length of Stay Meetings, dates discussed:    Additional Comments:  Pollie Friar, RN 05/26/2016, 11:42 AM

## 2016-05-27 DIAGNOSIS — B952 Enterococcus as the cause of diseases classified elsewhere: Secondary | ICD-10-CM

## 2016-05-27 DIAGNOSIS — M4646 Discitis, unspecified, lumbar region: Principal | ICD-10-CM

## 2016-05-27 LAB — PROTIME-INR
INR: 1.35 (ref 0.00–1.49)
PROTHROMBIN TIME: 16.8 s — AB (ref 11.6–15.2)

## 2016-05-27 LAB — HEPARIN LEVEL (UNFRACTIONATED)
Heparin Unfractionated: <0.1 IU/mL — ABNORMAL LOW (ref 0.30–0.70)
Heparin Unfractionated: 0.25 IU/mL — ABNORMAL LOW (ref 0.30–0.70)
Heparin Unfractionated: 0.49 IU/mL (ref 0.30–0.70)

## 2016-05-27 MED ORDER — WARFARIN SODIUM 5 MG PO TABS
5.0000 mg | ORAL_TABLET | Freq: Once | ORAL | Status: AC
Start: 2016-05-27 — End: 2016-05-27
  Administered 2016-05-27: 5 mg via ORAL
  Filled 2016-05-27: qty 1

## 2016-05-27 MED ORDER — WARFARIN - PHARMACIST DOSING INPATIENT
Freq: Every day | Status: DC
  Administered 2016-05-30 – 2016-06-01 (×2)

## 2016-05-27 NOTE — Progress Notes (Addendum)
ANTICOAGULATION CONSULT NOTE - Follow Up Consult  Pharmacy Consult for heparin>>warfarin Indication: atrial fibrillation  Allergies  Allergen Reactions  . Guaifenesin Er Palpitations    Pt can not take mucinex PM  . Prednisone Other (See Comments)    HIGH doses make patient feel "crazy"    Patient Measurements: Height: 5\' 8"  (172.7 cm) Weight: 150 lb 9.2 oz (68.3 kg) IBW/kg (Calculated) : 68.4 Heparin Dosing Weight: 68.3 kg  Vital Signs: Temp: 98.3 F (36.8 C) (06/27 0912) Temp Source: Oral (06/27 0912) BP: 154/74 mmHg (06/27 0912) Pulse Rate: 73 (06/27 0912)  Labs:  Recent Labs  05/25/16 0627 05/26/16 0546 05/27/16 0327 05/27/16 1255  HGB 8.9*  --   --   --   HCT 28.3*  --   --   --   PLT 284  --   --   --   LABPROT 25.6* 16.8* 16.8*  --   INR 2.37* 1.35 1.35  --   HEPARINUNFRC  --   --  <0.10* 0.25*  CREATININE 1.06 1.06  --   --     Estimated Creatinine Clearance: 45.6 mL/min (by C-G formula based on Cr of 1.06).  Assessment: 89 yoM with hx AFib on chronic anticoagulation with warfarin. Pharmacy consulted to dose heparin/warfarin PTA warfarin dose 5 mg MWF, 2.5 mg all other days (last dose PTA 6/22)  Heparin level this afternoon is slightly subtherapeutic at 0.25, will increase rate and f/u heparin level tonight.  Restarting warfarin today after holding for biopsy. Pt s/p vitamin K PO on 6/24 and 6/25. INR 1.35 today, subtherapeutic as expected.  Goal of Therapy:  INR 2-3 Heparin level 0.3-0.7 units/ml Monitor platelets by anticoagulation protocol: Yes   Plan:  Increase heparin infusion to 1100 units/hr Check anti-Xa level in 8 hours and daily while on heparin Continue to monitor H&H, and platelets  Give warfarin 5 mg x 1 dose Monitor daily INR, CBC, clinical course, s/sx of bleed, PO intake, DDI   Thank you for allowing [place] to participate in this patients care. Jens Som, PharmD Pager: [phone] 05/27/2016,1:50 PM

## 2016-05-27 NOTE — Progress Notes (Signed)
ANTICOAGULATION CONSULT NOTE - Follow Up Consult  Pharmacy Consult for heparin Indication: atrial fibrillation  Allergies  Allergen Reactions  . Guaifenesin Er Palpitations    Pt can not take mucinex PM  . Prednisone Other (See Comments)    HIGH doses make patient feel "crazy"    Patient Measurements: Height: 5\' 8"  (172.7 cm) Weight: 150 lb 9.2 oz (68.3 kg) IBW/kg (Calculated) : 68.4 Heparin Dosing Weight:   Vital Signs: Temp: 98.2 F (36.8 C) (06/27 1729) Temp Source: Oral (06/27 1729) BP: 128/63 mmHg (06/27 1729) Pulse Rate: 68 (06/27 1729)  Labs:  Recent Labs  05/25/16 0627 05/26/16 0546 05/27/16 0327 05/27/16 1255 05/27/16 1953  HGB 8.9*  --   --   --   --   HCT 28.3*  --   --   --   --   PLT 284  --   --   --   --   LABPROT 25.6* 16.8* 16.8*  --   --   INR 2.37* 1.35 1.35  --   --   HEPARINUNFRC  --   --  <0.10* 0.25* 0.49  CREATININE 1.06 1.06  --   --   --     Estimated Creatinine Clearance: 45.6 mL/min (by C-G formula based on Cr of 1.06).   Medications:  Scheduled:  . amiodarone  100 mg Oral Daily  . ampicillin (OMNIPEN) IV  2 g Intravenous Q6H  . cefTRIAXone (ROCEPHIN)  IV  2 g Intravenous Q12H  . levothyroxine  25 mcg Oral QAC breakfast  . simvastatin  20 mg Oral QHS  . Warfarin - Pharmacist Dosing Inpatient   Does not apply q1800   Infusions:  . heparin 1,100 Units/hr (05/27/16 1428)  . sodium chloride 0.9 % 1,000 mL infusion      Assessment: 82 yo male with afib is currently on therapeutic heparin.  Heparin level is 0.49; level was drawn a little early but expect level to still be therapeutic.  Goal of Therapy:  Heparin level 0.3-0.7 units/ml Monitor platelets by anticoagulation protocol: Yes   Plan:  - continue heparin at 1100 units/hr - confirm heparin level in am  Anysia Choi, Tsz-[facility]in 05/27/2016,8:23 PM

## 2016-05-27 NOTE — Progress Notes (Signed)
    [facility] for Infectious Disease    Date of Admission:  05/22/2016   Total days of antibiotics ampicillin  ID: [name] is a 82 y.o. male with   Principal Problem:   Enterococcal bacteremia Active Problems:   Hypothyroidism   Hyperlipidemia   HYPERTENSION, BENIGN ESSENTIAL   ATRIAL FIBRILLATION   SICK SINUS SYNDROME   PERIPHERAL VASCULAR DISEASE   BPH (benign prostatic hyperplasia)   PACEMAKER, PERMANENT   Right hip pain   Elevated C-reactive protein (CRP)   Altered mental status   Compression fracture of L2 lumbar vertebra with delayed healing   Acute encephalopathy    24hr events:  Patient getting fitted for TLSO brace.  Lumbar aspirate cx is reincubating suggestive of discitis  Assessment/Plan: Enterococcal bacteremia = continue with ampicillin plus ceftriaxone until can rule out endocarditis. Await for clearance of bacteremia before placing picc line  Diskitis = presumably due to enterococcal infection. Will follow up on identification. Plan to treat for 6 wk if abtx  Pacemaker = will need TEE to evaluate for endocarditis  [name], [facility] for Infectious Diseases Cell: [phone] Pager: [phone]  05/27/2016, 5:06 PM

## 2016-05-27 NOTE — Progress Notes (Signed)
Orthopedic Tech Progress Note Patient Details:  [name] 1923/12/05 [id number]  Patient ID: [name], male   DOB: 04-Nov-1924, 82 y.o.   MRN: [id number]   [name] 05/27/2016, 12:09 PMCalled [employer] for TLSO.

## 2016-05-27 NOTE — Progress Notes (Signed)
Patient states he is in no pain when laying in bed, not moving. When moving, patient states his pain can reach 10/10 which prevents him from moving. Patient educated about pain control, offered pain medication, refused. Will continue to monitor.

## 2016-05-27 NOTE — Progress Notes (Signed)
Patient does not know if TLSO brace has been brought to room. TLSO brace not in room. Ortho tech paged per order for TLSO brace

## 2016-05-27 NOTE — Progress Notes (Signed)
PROGRESS NOTE  [name] [id number] DOB: 07/28/1924 DOA: 05/22/2016 PCP: Merrilee Seashore, MD  Brief History:  82 year old male with a history of atrial fibrillation, TIA, hypertension, sick sinus syndrome status post PPM presented with worsening low back pain and confusion. The patient was admitted to [facility] from 03/16/2016 through 03/18/2016 after suffering a mechanical fall resulting in increasing right hip pain. Workup including CT of the hip at that time did not reveal a fracture. He was sent to physical nursing facility for rehabilitation. Unfortunately, pt continued to have significant back pain and declining mental status. As a result, the patient was brought to the emergency department. CT of the lumbar spine revealed compression fracture of L1 and burst fracture of L2 with ESR 140. The patient was seen by neurosurgery who felt that the patient was a poor surgical candidate. Interventional radiology was consulted for biopsy which was done on 05/26/16.  Prior to the biopsy, his blood cultures became positive.  Therefore, empiric vancomycin was started.  Assessment/Plan: Enterococcus Bacteremia -unknown source -IV vanco-->ampicillin on 6/26 (total abx D#5) -05/22/16 blood culture--enterococcus sp. -4/82/50--IBB--CW 88-89%, no embolic source -1/69/45--[id number] blood cultures--neg to date -05/26/16--L2 biopsy-->reincubation for better growth suggesting positive cultutre--follow -TEE scheduled for 6/29  L1-L2 compression fracture/Discitis -TLSO brace -Appreciate neurosurgery consultation -case discussed with Dr. Shanon Brow Jones-->concerned about ESR 140 -pain control -MR not possible due to PPM -continue oxycodone PRN pain -05/26/16--L2 biopsy-->reincubation for better growth suggesting positive cultutre--follow culture and pathology  Acute encephalopathy -check serum B12--644 -TSH--4.22 -Ammonia--22 -continue amiodarone -Chest x-ray--neg -EKG--sinus, no concerning  ST-T changes -UA--no pyuria -continue IVF as po has poor po intake -oxycodone is making pt drowsy  Atrial fibrillation -CHADS-VASc = 5 -restart intravenous heparin bridge 05/26/2016--dicussed with Dr. Estanislado Pandy -05/27/16--restart warfarin -rate controlled  Coaguloapthy--warfarin induced -received vitamin K 5 mg x 2 doses for biopsy  CKD stage 3 -Baseline creatinine 1.0-1.2 -Check BMP  Hypothyroidism -Continue Synthroid -TSH 4.299 -recheck TSH in 4 weeks when pt more stable clinically  Hyperlipidemia -continue zocor  SSS -S/p PPM -monitor on tele  Goals of care -discussed with family -05/26/16--change pt to DNR  Disposition Plan: SNF 6/29 if stable Family Communication:wife and daughter updated at bedside 6/27 Consultants: Neurosurgery, ID, IR, Cardiology (TEE)  Code Status: DNR  DVT Prophylaxis: Full AC with warfarin   Procedures: As Listed in Progress Note Above  Antibiotics: Vancomycin 6/23-->6/26 Ampicillin 6/26>>> ceftriazone 6/26>>>    Subjective: Patient feels that his back pain this is somewhat better than yesterday. However it worsens with any kind of movement. He was fitted with a back brace today. Denies any fevers, chills, chest pain, sinus breath, nausea, vomiting, diarrhea, abdominal pain. No dysuria or hematuria.  Objective: Filed Vitals:   05/27/16 0512 05/27/16 0912 05/27/16 1414 05/27/16 1729  BP: 163/78 154/74 152/76 128/63  Pulse: 79 73 77 68  Temp: 98.6 F (37 C) 98.3 F (36.8 C) 97.8 F (36.6 C) 98.2 F (36.8 C)  TempSrc: Oral Oral Oral Oral  Resp: _0 Height:      Weight:      SpO2: 94% 99% 100% 95%    Intake/Output Summary (Last 24 hours) at 05/27/16 1809 Last data filed at 05/27/16 1056  Gross per 24 hour  Intake      0 ml  Output   1300 ml  Net  -1300 ml   Weight change:  Exam:   General:  Pt is alert, follows commands appropriately, not  in acute distress  HEENT: No icterus, No thrush, No neck  mass, [place]/AT  Cardiovascular: RRR, S1/S2, no rubs, no gallops  Respiratory: Bibasilar crackles. No wheezes. Good air movement  Abdomen: Soft/+BS, non tender, non distended, no guarding  Extremities: No edema, No lymphangitis, No petechiae, No rashes, no synovitis   Data Reviewed: I have personally reviewed following labs and imaging studies Basic Metabolic Panel:  Recent Labs Lab 05/22/16 1551 05/23/16 0447 05/25/16 0627 05/26/16 0546  NA 131* 135 137 134*  K 4.1 4.8 3.8 4.0  CL 97* 104 103 100*  CO2 _0 GLUCOSE 158* 121* 138* 114*  BUN _1 CREATININE 1.17 1.16 1.06 1.06  CALCIUM 8.6* 8.0* 8.3* 8.0*   Liver Function Tests: No results for input(s): AST, ALT, ALKPHOS, BILITOT, PROT, ALBUMIN in the last 168 hours. No results for input(s): LIPASE, AMYLASE in the last 168 hours.  Recent Labs Lab 05/23/16 0857  AMMONIA 22   Coagulation Profile:  Recent Labs Lab 05/23/16 0447 05/24/16 0456 05/25/16 0627 05/26/16 0546 05/27/16 0327  INR 4.22* 4.32* 2.37* 1.35 1.35   CBC:  Recent Labs Lab 05/22/16 1551 05/23/16 0447 05/23/16 0857 05/25/16 0627  WBC 12.6* 9.0  --  7.8  NEUTROABS 10.6*  --   --   --   HGB 10.1* 8.5*  --  8.9*  HCT 31.2* 27.3* 29.4* 28.3*  MCV 92.6 94.1  --  93.1  PLT 256 225  --  284   Cardiac Enzymes: No results for input(s): CKTOTAL, CKMB, CKMBINDEX, TROPONINI in the last 168 hours. BNP: Invalid input(s): POCBNP CBG:  Recent Labs Lab 05/24/16 1557  GLUCAP 136*   HbA1C: No results for input(s): HGBA1C in the last 72 hours. Urine analysis:    Component Value Date/Time   COLORURINE YELLOW 05/22/2016 1754   APPEARANCEUR CLEAR 05/22/2016 1754   LABSPEC 1.016 05/22/2016 1754   PHURINE 7.0 05/22/2016 1754   GLUCOSEU NEGATIVE 05/22/2016 1754   GLUCOSEU NEGATIVE 09/28/2008 1243   HGBUR NEGATIVE 05/22/2016 1754   BILIRUBINUR NEGATIVE 05/22/2016 1754   KETONESUR NEGATIVE 05/22/2016 1754   PROTEINUR NEGATIVE  05/22/2016 1754   UROBILINOGEN 0.2 mg/dL 09/28/2008 1243   NITRITE NEGATIVE 05/22/2016 1754   LEUKOCYTESUR NEGATIVE 05/22/2016 1754   Sepsis Labs: _2 (procalcitonin:4,lacticidven:4) ) Recent Results (from the past 240 hour(s))  Blood culture (routine x 2)     Status: Abnormal   Collection Time: 05/22/16 10:12 PM  Result Value Ref Range Status   Specimen Description BLOOD RIGHT HAND  Final   Special Requests IN PEDIATRIC BOTTLE 2ML  Final   Culture  Setup Time   Final    GRAM POSITIVE COCCI IN PAIRS IN CHAINS CRITICAL RESULT CALLED TO, READ BACK BY AND VERIFIED WITH: TValinda Party.D. 17:20 05/23/16 ([email or web address]) IN PEDIATRIC BOTTLE    Culture (A)  Final    ENTEROCOCCUS SPECIES SUSCEPTIBILITIES PERFORMED ON PREVIOUS CULTURE WITHIN THE LAST 5 DAYS.    Report Status 05/25/2016 FINAL  Final  Blood culture (routine x 2)     Status: Abnormal   Collection Time: 05/22/16 10:18 PM  Result Value Ref Range Status   Specimen Description BLOOD LEFT FOREARM  Final   Special Requests BOTTLES DRAWN AEROBIC AND ANAEROBIC 5ML  Final   Culture  Setup Time   Final    GRAM POSITIVE COCCOBACILLUS IN BOTH AEROBIC AND ANAEROBIC BOTTLES CRITICAL RESULT CALLED TO, READ BACK BY AND VERIFIED WITH: T EGAN,PHARMD AT 1720 05/23/16 BY M  Siemen    Culture ENTEROCOCCUS SPECIES (A)  Final   Report Status 05/25/2016 FINAL  Final   Organism ID, Bacteria ENTEROCOCCUS SPECIES  Final      Susceptibility   Enterococcus species - MIC*    AMPICILLIN <=2 SENSITIVE Sensitive     VANCOMYCIN 1 SENSITIVE Sensitive     GENTAMICIN SYNERGY SENSITIVE Sensitive     * ENTEROCOCCUS SPECIES  Blood Culture ID Panel (Reflexed)     Status: Abnormal   Collection Time: 05/22/16 10:18 PM  Result Value Ref Range Status   Enterococcus species DETECTED (A) NOT DETECTED Final    Comment: CRITICAL RESULT CALLED TO, READ BACK BY AND VERIFIED WITH: TValinda Party.D. 17:20 05/23/16 ([email or web address])    Vancomycin resistance NOT DETECTED NOT  DETECTED Final   Listeria monocytogenes NOT DETECTED NOT DETECTED Final   Staphylococcus species NOT DETECTED NOT DETECTED Final   Staphylococcus aureus NOT DETECTED NOT DETECTED Final   Methicillin resistance NOT DETECTED NOT DETECTED Final   Streptococcus species NOT DETECTED NOT DETECTED Final   Streptococcus agalactiae NOT DETECTED NOT DETECTED Final   Streptococcus pneumoniae NOT DETECTED NOT DETECTED Final   Streptococcus pyogenes NOT DETECTED NOT DETECTED Final   Acinetobacter baumannii NOT DETECTED NOT DETECTED Final   Enterobacteriaceae species NOT DETECTED NOT DETECTED Final   Enterobacter cloacae complex NOT DETECTED NOT DETECTED Final   Escherichia coli NOT DETECTED NOT DETECTED Final   Klebsiella oxytoca NOT DETECTED NOT DETECTED Final   Klebsiella pneumoniae NOT DETECTED NOT DETECTED Final   Proteus species NOT DETECTED NOT DETECTED Final   Serratia marcescens NOT DETECTED NOT DETECTED Final   Carbapenem resistance NOT DETECTED NOT DETECTED Final   Haemophilus influenzae NOT DETECTED NOT DETECTED Final   Neisseria meningitidis NOT DETECTED NOT DETECTED Final   Pseudomonas aeruginosa NOT DETECTED NOT DETECTED Final   Candida albicans NOT DETECTED NOT DETECTED Final   Candida glabrata NOT DETECTED NOT DETECTED Final   Candida krusei NOT DETECTED NOT DETECTED Final   Candida parapsilosis NOT DETECTED NOT DETECTED Final   Candida tropicalis NOT DETECTED NOT DETECTED Final  MRSA PCR Screening     Status: None   Collection Time: 05/23/16  1:17 AM  Result Value Ref Range Status   MRSA by PCR NEGATIVE NEGATIVE Final    Comment:        The GeneXpert MRSA Assay (FDA approved for NASAL specimens only), is one component of a comprehensive MRSA colonization surveillance program. It is not intended to diagnose MRSA infection nor to guide or monitor treatment for MRSA infections.   Culture, blood (Routine X 2) w Reflex to ID Panel     Status: None (Preliminary result)    Collection Time: 05/24/16  4:54 AM  Result Value Ref Range Status   Specimen Description BLOOD RIGHT ARM  Final   Special Requests AEROBIC BOTTLE ONLY 5ML  Final   Culture NO GROWTH 3 DAYS  Final   Report Status PENDING  Incomplete  Culture, blood (Routine X 2) w Reflex to ID Panel     Status: None (Preliminary result)   Collection Time: 05/24/16  4:56 AM  Result Value Ref Range Status   Specimen Description BLOOD RIGHT ARM  Final   Special Requests AEROBIC BOTTLE ONLY 3ML  Final   Culture NO GROWTH 3 DAYS  Final   Report Status PENDING  Incomplete  Aerobic/Anaerobic Culture (surgical/deep wound)     Status: None (Preliminary result)   Collection Time: 05/26/16  4:07 PM  Result Value Ref Range Status   Specimen Description TISSUE  Final   Special Requests LUMBAR L2 OF BACK  Final   Gram Stain   Final    ABUNDANT WBC PRESENT,BOTH PMN AND MONONUCLEAR NO ORGANISMS SEEN    Culture CULTURE REINCUBATED FOR BETTER GROWTH  Final   Report Status PENDING  Incomplete     Scheduled Meds: . amiodarone  100 mg Oral Daily  . ampicillin (OMNIPEN) IV  2 g Intravenous Q6H  . cefTRIAXone (ROCEPHIN)  IV  2 g Intravenous Q12H  . levothyroxine  25 mcg Oral QAC breakfast  . simvastatin  20 mg Oral QHS  . Warfarin - Pharmacist Dosing Inpatient   Does not apply q1800   Continuous Infusions: . heparin 1,100 Units/hr (05/27/16 1428)  . sodium chloride 0.9 % 1,000 mL infusion      Procedures/Studies: Ct Head Wo Contrast  05/22/2016  CLINICAL DATA:  Back pain, leg pain EXAM: CT HEAD WITHOUT CONTRAST TECHNIQUE: Contiguous axial images were obtained from the base of the skull through the vertex without intravenous contrast. COMPARISON:  09/21/2011 FINDINGS: Brain: No evidence of acute infarction, hemorrhage, extra-axial collection, ventriculomegaly, or mass effect. There is an area of encephalomalacia in the right parietal lobe. 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 Generalized  cerebral atrophy. Periventricular white matter low attenuation likely secondary to microangiopathy. Vascular: Cerebrovascular atherosclerotic calcifications are noted. Skull: Negative for fracture or focal lesion. Sinuses/Orbits: Visualized portions of the orbits are unremarkable. Visualized portions of the paranasal sinuses and mastoid air cells are unremarkable. Other: None. IMPRESSION: 1. No acute intracranial pathology. 2. Chronic microvascular disease and cerebral atrophy. Electronically Signed   By: Kathreen Devoid   On: 05/22/2016 21:17   Ct Lumbar Spine Wo Contrast  05/22/2016  CLINICAL DATA:  Pain post fall EXAM: CT LUMBAR SPINE WITHOUT CONTRAST TECHNIQUE: Multidetector CT imaging of the lumbar spine was performed without intravenous contrast administration. Multiplanar CT image reconstructions were also generated. COMPARISON:  CT 10/26/2007 FINDINGS: Five non rib bearing lobar is segments labeled L1-L5. T11-12: Bridging anterior endplate osteophytes. Osteopenia. Central canal and foramina patent. T12-L1: Bridging anterior osteophytes. Central canal and foramina patent. L1-2: Compression fracture deformity involving inferior endplate of L1 with a estimated 50% loss of height, more severe right than left. Additionally, there is burst fracture of L2 with greater than 50% loss of height anteriorly left greater than right. Fracture at L2 extends to involve the pedicles bilaterally, minimally displaced. Approximately 4 mm retropulsion at the level of the superior endplate of L2. There is a small amount of gas in the interspace extending up into the fractured component of L1. No unexpected soft tissue component. L2-3:  Mild disc bulge.  Mild bilateral facet DJD. L3-4:  Disc bulge.  Bilateral facet DJD.  Probable spinal stenosis. L4-5: Disc narrowing. Disc bulge. Grade 1 anterolisthesis probably secondary to the moderate facet DJD. Probable spinal stenosis. L5-S1:  Asymmetric disc narrowing.  Mild bulge.  Facet  DJD. Fusion across bilateral sacroiliac joints. Patchy aortoiliac arterial calcifications without aneurysm. Cardiac pacing leads partially visualized. IMPRESSION: 1. Compression fracture of L1 and burst fracture of L2, as detailed above, probably posttraumatic. No other findings to suggest discitis or metastatic involvement. Findings were reviewed with Dr. Nevada Crane, who concurs. 2. Spondylitic changes L2 to S1, with probable spinal stenosis L3-4 and L4-5. Electronically Signed   By: Lucrezia Europe M.D.   On: 05/22/2016 18:05   Ct Hip Right Wo Contrast  05/22/2016  CLINICAL DATA:  Fall today. Severe lower back pain and right hip pain EXAM: CT OF THE RIGHT HIP WITHOUT CONTRAST TECHNIQUE: Multidetector CT imaging of the right hip was performed according to the standard protocol. Multiplanar CT image reconstructions were also generated. COMPARISON:  03/16/2016 FINDINGS: Negative for fracture no significant effusion. Minor spurring from the femoral head and superior acetabulum. Right superior and inferior ischiopubic rami intact. Fusion across the right SI joint. Patchy iliac arterial calcifications without aneurysm. Urinary bladder is distended. IMPRESSION: 1. Negative.  No hip fracture. Electronically Signed   By: Lucrezia Europe M.D.   On: 05/22/2016 18:08   Dg Chest Port 1 View  05/23/2016  CLINICAL DATA:  Low back pain, progressively worsening since fall April 2017. Cough. EXAM: PORTABLE CHEST 1 VIEW COMPARISON:  Chest CT 09/12/2013 FINDINGS: Right pacer in place with leads in the right atrium and right ventricle. Heart is normal size. No confluent airspace opacities. Scattered aortic calcifications. No acute bony abnormality. IMPRESSION: No active cardiopulmonary disease. Aortic atherosclerosis. Electronically Signed   By: Rolm Baptise M.D.   On: 05/23/2016 09:01    Cynthie Garmon, DO  [facility] Pager [phone]  If 7PM-7AM, please contact night-coverage [email or web address] Password [email or web address] 05/27/2016, 6:09 PM    LOS: 5 days

## 2016-05-27 NOTE — Progress Notes (Signed)
Patient turned q2 through day shift with staff assistance. Sacral foam dressing was applied to sacral area, bilateral heel protectors applied, heels raised. Patient does not want to move, educated multiple times, family was able to get patient to move. Patient states he is in too much pain when he is moved, offered pain medication prior to movement, refused multiple times. Patient was given oxy ir per [employer], states his pain reduced when laying still but increases to 10/10 when being moved. Will continue to monitor.

## 2016-05-28 DIAGNOSIS — R400 Somnolence: Secondary | ICD-10-CM

## 2016-05-28 DIAGNOSIS — R509 Fever, unspecified: Secondary | ICD-10-CM

## 2016-05-28 LAB — PROTIME-INR
INR: 1.82 — AB (ref 0.00–1.49)
PROTHROMBIN TIME: 21 s — AB (ref 11.6–15.2)

## 2016-05-28 LAB — HEPARIN LEVEL (UNFRACTIONATED): Heparin Unfractionated: 0.5 IU/mL (ref 0.30–0.70)

## 2016-05-28 MED ORDER — WARFARIN SODIUM 5 MG PO TABS
2.5000 mg | ORAL_TABLET | Freq: Once | ORAL | Status: AC
  Administered 2016-05-28: 2.5 mg via ORAL
  Filled 2016-05-28: qty 1

## 2016-05-28 NOTE — NC FL2 (Signed)
[place] LEVEL OF CARE SCREENING TOOL     IDENTIFICATION  Patient Name: [name] Birthdate: 22-Jan-1924 Sex: male Admission Date (Current Location): 05/22/2016  [facility] and [place] Number:  Herbalist and Address:  [facility] [place] [address], [place], [place] [address]      Provider Number: [id number]  Attending Physician Name and Address:  Modena Jansky, MD  Relative Name and Phone Number:  Hassan Rowan, spouse, [phone]    Current Level of Care: Hospital Recommended Level of Care: [place] Prior Approval Number:    Date Approved/Denied:   PASRR Number: [id number]  Discharge Plan: SNF    Current Diagnoses: Patient Active Problem List   Diagnosis Date Noted  . Enterococcal bacteremia 05/24/2016  . Acute encephalopathy 05/24/2016  . Compression fracture of L2 lumbar vertebra with delayed healing 05/23/2016  . Elevated C-reactive protein (CRP) 05/22/2016  . Altered mental status 05/22/2016  . Right hip pain 03/17/2016  . Encounter for therapeutic drug monitoring 01/05/2014  . Gait difficulty 11/22/2013  . Chronic ischemic right PCA stroke 11/22/2013  . 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 PERIPHERAL NEUROPATHY 07/18/2010  . ABRASION/FRICION BURN OTH MX&UNS SITE W/O INF 09/25/2009  . PACEMAKER, PERMANENT 09/25/2009  . PREMATURE VENTRICULAR CONTRACTIONS 02/23/2009  . BRADYCARDIA 02/23/2009  . ATAXIA 02/23/2009  . ANXIETY 08/03/2008  . BPH (benign prostatic hyperplasia) 08/03/2008  . INSOMNIA-SLEEP DISORDER-UNSPEC 08/03/2008  . Hypothyroidism 10/26/2007  . PERIPHERAL VASCULAR DISEASE 10/26/2007  . ALLERGIC RHINITIS 10/26/2007  . HYPERTENSION, BENIGN ESSENTIAL 10/25/2007  . ATRIAL FIBRILLATION 08/26/2007  . Hyperlipidemia 08/23/2007  . SICK SINUS SYNDROME 08/23/2007  . CAROTID ARTERY STENOSIS 08/23/2007    Orientation RESPIRATION BLADDER Height & Weight     Self, Time,  Situation, Place  Normal Incontinent Weight: 68.3 kg (150 lb 9.2 oz) Height:  5\' 8"  (172.7 cm)  BEHAVIORAL SYMPTOMS/MOOD NEUROLOGICAL BOWEL NUTRITION STATUS      Continent Diet (Please see DC Summary)  AMBULATORY STATUS COMMUNICATION OF NEEDS Skin   Extensive Assist Verbally Normal                       Personal Care Assistance Level of Assistance  Bathing, Feeding, Dressing Bathing Assistance: Maximum assistance Feeding assistance: Independent Dressing Assistance: Maximum assistance     Functional Limitations Info             SPECIAL CARE FACTORS FREQUENCY  PT (By licensed PT)     PT Frequency: 5x/week              Contractures      Additional Factors Info  Code Status, Allergies Code Status Info: DNR Allergies Info: Guaifenesin Er, Prednisone           Current Medications (05/28/2016):  This is the current hospital active medication list Current Facility-Administered Medications  Medication Dose Route Frequency Provider Last Rate Last Dose  . acetaminophen (TYLENOL) tablet 650 mg  650 mg Oral Q6H PRN Tanna Savoy Stinson, DO   650 mg at 05/24/16 1235   Or  . acetaminophen (TYLENOL) suppository 650 mg  650 mg Rectal Q6H PRN Truett Mainland, DO      . amiodarone (PACERONE) tablet 100 mg  100 mg Oral Daily Tanna Savoy Stinson, DO   100 mg at 05/28/16 [id number]  . ampicillin (OMNIPEN) 2 g in sodium chloride 0.9 % 50 mL IVPB  2 g Intravenous Q6H Carlyle Basques, MD  2 g at 05/28/16 1136  . cefTRIAXone (ROCEPHIN) 2 g in dextrose 5 % 50 mL IVPB  2 g Intravenous Q12H Carlyle Basques, MD   2 g at 05/28/16 0833  . heparin ADULT infusion 100 units/mL (25000 units/238mL sodium chloride 0.45%)  1,100 Units/hr Intravenous Continuous Orson Eva, MD 11 mL/hr at 05/28/16 0308 1,100 Units/hr at 05/28/16 0308  . ibuprofen (ADVIL,MOTRIN) tablet 600 mg  600 mg Oral Q6H PRN Tanna Savoy Stinson, DO   600 mg at 05/26/16 0116  . levothyroxine (SYNTHROID, LEVOTHROID) tablet 25 mcg  25 mcg Oral QAC  breakfast Truett Mainland, DO   25 mcg at 05/28/16 [id number]  . ondansetron (ZOFRAN) tablet 4 mg  4 mg Oral Q6H PRN Tanna Savoy Stinson, DO       Or  . ondansetron [facility]) injection 4 mg  4 mg Intravenous Q6H PRN Tanna Savoy Stinson, DO      . oxyCODONE (Oxy IR/ROXICODONE) immediate release tablet 5 mg  5 mg Oral Q4H PRN Tanna Savoy Stinson, DO   5 mg at 05/28/16 [id number]  . polyethylene glycol (MIRALAX / GLYCOLAX) packet 17 g  17 g Oral Daily PRN Tanna Savoy Stinson, DO   17 g at 05/28/16 [id number]  . simvastatin (ZOCOR) tablet 20 mg  20 mg Oral QHS Tanna Savoy Stinson, DO   20 mg at 05/27/16 2132  . sodium chloride 0.9 % 1,000 mL infusion   Intravenous Continuous Shanon Brow Tat, MD      . warfarin (COUMADIN) tablet 2.5 mg  2.5 mg Oral ONCE-1800 Modena Jansky, MD      . Warfarin - Pharmacist Dosing Inpatient   Does not apply q1800 Orson Eva, MD      . Zinc Oxide (TRIPLE PASTE) 12.8 % ointment   Topical PRN Truett Mainland, DO         Discharge Medications: Please see discharge summary for a list of discharge medications.  Relevant Imaging Results:  Relevant Lab Results:   Additional Information SSN: [place] [address] [place], [place]

## 2016-05-28 NOTE — Clinical Social Work Note (Signed)
Clinical Social Work Assessment  Patient Details  Name: Nghia Mcentee MRN: [id number] Date of Birth: [birth year]  Date of referral:  05/28/16               Reason for consult:  Facility Placement                Permission sought to share information with:  Facility Sport and exercise psychologist, Family Supports Permission granted to share information::  Yes, Verbal Permission Granted  Name::     Wellsite geologist::  SNFs  Relationship::  Spouse  Contact Information:  [phone]  Housing/Transportation Living arrangements for the past 2 months:  [place] of Information:  Patient, Spouse Patient Interpreter Needed:  None Criminal Activity/Legal Involvement Pertinent to Current Situation/Hospitalization:  No - Comment as needed Significant Relationships:  Adult Children, Spouse Lives with:  Spouse Do you feel safe going back to the place where you live?  No Need for family participation in patient care:  Yes (Comment)  Care giving concerns:  CSW received referral for possible SNF placement at time of discharge. CSW met with patient and patient's wife at bedside regarding PT recommendation of SNF placement at time of discharge. Per patient's wife, patient's wife is currently unable to care for patient at their home given patient's current physical needs and fall risk. Patient and patient's wife expressed understanding of PT recommendation and are agreeable to SNF placement at time of discharge. CSW to continue to follow and assist with discharge planning needs.   Social Worker assessment / plan:  CSW spoke with patient and patient's wife concerning possibility of rehab at [facility] before returning home.  Employment status:  Retired Forensic scientist:  Medicare PT Recommendations:  [place] / Referral to community resources:  [place]  Patient/Family's Response to care:  Patient and patient's spouse recognize need for rehab before  returning home and are agreeable to a SNF in [place]. Patient reported preference for [facility] since patient has been there recently (private pay).  Patient/Family's Understanding of and Emotional Response to Diagnosis, Current Treatment, and Prognosis:  Patient is realistic regarding therapy needs. No questions/concerns about plan or treatment.    Emotional Assessment Appearance:  Appears stated age Attitude/Demeanor/Rapport:  Sedated Affect (typically observed):  Accepting, Quiet Orientation:  Oriented to Self, Oriented to Place, Oriented to  Time, Oriented to Situation Alcohol / Substance use:  Not Applicable Psych involvement (Current and /or in the community):  No (Comment)  Discharge Needs  Concerns to be addressed:  Care Coordination Readmission within the last 30 days:  No Current discharge risk:  None Barriers to Discharge:  Continued Medical Work up   [employer], [place] 05/28/2016, 2:16 PM

## 2016-05-28 NOTE — Progress Notes (Signed)
ANTICOAGULATION CONSULT NOTE - Follow Up Consult  Pharmacy Consult for heparin>>warfarin Indication: atrial fibrillation  Allergies  Allergen Reactions  . Guaifenesin Er Palpitations    Pt can not take mucinex PM  . Prednisone Other (See Comments)    HIGH doses make patient feel "crazy"    Patient Measurements: Height: 5\' 8"  (172.7 cm) Weight: 150 lb 9.2 oz (68.3 kg) IBW/kg (Calculated) : 68.4 Heparin Dosing Weight: 68.3 kg  Vital Signs: Temp: 97.5 F (36.4 C) (06/28 0838) Temp Source: Oral (06/28 0838) BP: 150/66 mmHg (06/28 0838) Pulse Rate: 72 (06/28 0838)  Labs:  Recent Labs  05/26/16 0546  05/27/16 0327 05/27/16 1255 05/27/16 1953 05/28/16 0537 05/28/16 0955  LABPROT 16.8*  --  16.8*  --   --   --  21.0*  INR 1.35  --  1.35  --   --   --  1.82*  HEPARINUNFRC  --   < > <0.10* 0.25* 0.49 0.50  --   CREATININE 1.06  --   --   --   --   --   --   < > = values in this interval not displayed.  Estimated Creatinine Clearance: 45.6 mL/min (by C-G formula based on Cr of 1.06).  Assessment: 44 yoM with hx AFib on chronic anticoagulation with warfarin. Pharmacy consulted to dose heparin/warfarin PTA warfarin dose 5 mg MWF, 2.5 mg all other days (last dose PTA 6/22)  Heparin level this morning is therapeutic at 0.50, will continue current rate and f/u daily heparin level.  Restarted warfarin 6/27 after holding for biopsy. Pt s/p vitamin K PO on 6/24 and 6/25. INR 1.82 today, subtherapeutic but significant increase from yesterday after 1 x 5 mg dose.  Goal of Therapy:  INR 2-3 Heparin level 0.3-0.7 units/ml Monitor platelets by anticoagulation protocol: Yes   Plan:  Continue heparin infusion at 1100 units/hr Check anti-Xa level daily while on heparin Continue to monitor H&H, and platelets  Give warfarin 2.5 mg x 1 dose Monitor daily INR, CBC, clinical course, s/sx of bleed, PO intake, DDI   Thank you for allowing [place] to participate in this patients  care. Jens Som, PharmD Pager: [phone] 05/28/2016,11:32 AM

## 2016-05-28 NOTE — Progress Notes (Signed)
PROGRESS NOTE  Deren Degrazia LZJ:[id number] DOB: 1924/10/18 DOA: 05/22/2016 PCP: Merrilee Seashore, MD  Brief History:  82 year old male with a history of atrial fibrillation, TIA, hypertension, sick sinus syndrome status post PPM presented with worsening low back pain and confusion. The patient was admitted to [facility] from 03/16/2016 through 03/18/2016 after suffering a mechanical fall resulting in increasing right hip pain. Workup including CT of the hip at that time did not reveal a fracture. He was sent to physical nursing facility for rehabilitation. Unfortunately, pt continued to have significant back pain and declining mental status. As a result, the patient was brought to the emergency department. CT of the lumbar spine revealed compression fracture of L1 and burst fracture of L2 with ESR 140. The patient was seen by neurosurgery who felt that the patient was a poor surgical candidate. Interventional radiology was consulted for biopsy which was done on 05/26/16.  Prior to the biopsy, his blood cultures became positive.  Therefore, empiric vancomycin was started.  Assessment/Plan: Enterococcus Bacteremia -unknown source -IV vanco-->ampicillin + ceftriaxone. Total 7 days antibiotics thus far. -05/22/16 blood culture--enterococcus sp. -0/24/09--BDZ--HG 99-24%, no embolic source -2/68/34--[id number] blood cultures--neg to date -05/26/16--L2 biopsy-->reincubation for better growth suggesting positive cultutre--follow -TEE scheduled for 6/29 - Discussed with ID. Await further recommendations.  L1-L2 compression fracture/Discitis -TLSO brace -Appreciate neurosurgery consultation -case discussed by Dr. Carles Collet with Dr. Shanon Brow Jones-->concerned about ESR 140 -pain control -MR not possible due to PPM -continue oxycodone PRN pain -05/26/16--L2 biopsy-->reincubation for better growth suggesting positive cultutre--follow culture and pathology  Acute encephalopathy -check serum  B12--644 -TSH--4.22 -Ammonia--22 -continue amiodarone -Chest x-ray--neg -EKG--sinus, no concerning ST-T changes -UA--no pyuria -continue IVF as po has poor po intake -oxycodone is making pt drowsy. Mental status improved.  Atrial fibrillation -CHADS-VASc = 5 -restart intravenous heparin bridge 05/26/2016--dicussed with Dr. Estanislado Pandy -05/27/16--restart warfarin. Pharmacy managing heparin bridging and Coumadin. -rate controlled  Coaguloapthy--warfarin induced -received vitamin K 5 mg x 2 doses for biopsy  CKD stage 3 -Baseline creatinine 1.0-1.2 -Stable.  Hypothyroidism -Continue Synthroid -TSH 4.299 -recheck TSH in 4 weeks when pt more stable clinically  Hyperlipidemia -continue zocor  SSS -S/p PPM -monitor on tele  Goals of care -discussed with family -05/26/16--change pt to DNR  Disposition Plan: SNF 6/29 if stable and cleared by ID. Family Communication:wife and grandson updated at bedside 6/28 Consultants: Neurosurgery, ID, IR, Cardiology (TEE)  Code Status: DNR  DVT Prophylaxis: Full AC with warfarin and heparin bridging   Procedures: As Listed in Progress Note Above  Antibiotics: Vancomycin 6/23-->6/26 Ampicillin 6/26>>> ceftriazone 6/26>>>    Subjective: Denies back pain. As per patient and family at bedside, no other complaints reported. As per RN, no acute issues.  Objective: Filed Vitals:   05/28/16 0122 05/28/16 0537 05/28/16 0838 05/28/16 1400  BP: 164/98 151/68 150/66 138/61  Pulse: 71 67 72 96  Temp: 97.7 F (36.5 C) 99.1 F (37.3 C) 97.5 F (36.4 C) 97.7 F (36.5 C)  TempSrc: Axillary Oral Oral Oral  Resp: '16 16 16 16  '$ Height:      Weight:      SpO2: 99% 98% 100% 97%    Intake/Output Summary (Last 24 hours) at 05/28/16 1724 Last data filed at 05/28/16 1647  Gross per 24 hour  Intake      0 ml  Output   1150 ml  Net  -1150 ml    Weight change:  Exam:   General:  Pt is alert,  follows commands appropriately, not  in acute distress. Sitting up comfortably in chair this morning. Family at bedside.  HEENT: No icterus, No thrush, No neck mass, [place]/AT  Cardiovascular: RRR, S1/S2, no rubs, no gallops. Not on telemetry.  Respiratory: Slightly diminished breath sounds in the bases but otherwise clear to auscultation. No increased work of breathing.  Abdomen: Soft/+BS, non tender, non distended, no guarding  Extremities: No edema, No lymphangitis, No petechiae, No rashes, no synovitis  CNS: Alert and oriented. No focal deficits.  MSS: Has TLSO brace.   Data Reviewed: I have personally reviewed following labs and imaging studies Basic Metabolic Panel:  Recent Labs Lab 05/22/16 1551 05/23/16 0447 05/25/16 0627 05/26/16 0546  NA 131* 135 137 134*  K 4.1 4.8 3.8 4.0  CL 97* 104 103 100*  CO2 '25 26 27 28  '$ GLUCOSE 158* 121* 138* 114*  BUN '18 16 13 10  '$ CREATININE 1.17 1.16 1.06 1.06  CALCIUM 8.6* 8.0* 8.3* 8.0*   Liver Function Tests: No results for input(s): AST, ALT, ALKPHOS, BILITOT, PROT, ALBUMIN in the last 168 hours. No results for input(s): LIPASE, AMYLASE in the last 168 hours.  Recent Labs Lab 05/23/16 0857  AMMONIA 22   Coagulation Profile:  Recent Labs Lab 05/24/16 0456 05/25/16 0627 05/26/16 0546 05/27/16 0327 05/28/16 0955  INR 4.32* 2.37* 1.35 1.35 1.82*   CBC:  Recent Labs Lab 05/22/16 1551 05/23/16 0447 05/23/16 0857 05/25/16 0627  WBC 12.6* 9.0  --  7.8  NEUTROABS 10.6*  --   --   --   HGB 10.1* 8.5*  --  8.9*  HCT 31.2* 27.3* 29.4* 28.3*  MCV 92.6 94.1  --  93.1  PLT 256 225  --  284   Cardiac Enzymes: No results for input(s): CKTOTAL, CKMB, CKMBINDEX, TROPONINI in the last 168 hours. BNP: Invalid input(s): POCBNP CBG:  Recent Labs Lab 05/24/16 1557  GLUCAP 136*   HbA1C: No results for input(s): HGBA1C in the last 72 hours. Urine analysis:    Component Value Date/Time   COLORURINE YELLOW 05/22/2016 1754   APPEARANCEUR CLEAR 05/22/2016  1754   LABSPEC 1.016 05/22/2016 1754   PHURINE 7.0 05/22/2016 1754   GLUCOSEU NEGATIVE 05/22/2016 1754   GLUCOSEU NEGATIVE 09/28/2008 1243   HGBUR NEGATIVE 05/22/2016 1754   BILIRUBINUR NEGATIVE 05/22/2016 1754   KETONESUR NEGATIVE 05/22/2016 1754   PROTEINUR NEGATIVE 05/22/2016 1754   UROBILINOGEN 0.2 mg/dL 09/28/2008 1243   NITRITE NEGATIVE 05/22/2016 1754   LEUKOCYTESUR NEGATIVE 05/22/2016 1754   Sepsis Labs: '@LABRCNTIP'$ (procalcitonin:4,lacticidven:4) ) Recent Results (from the past 240 hour(s))  Blood culture (routine x 2)     Status: Abnormal   Collection Time: 05/22/16 10:12 PM  Result Value Ref Range Status   Specimen Description BLOOD RIGHT HAND  Final   Special Requests IN PEDIATRIC BOTTLE 2ML  Final   Culture  Setup Time   Final    GRAM POSITIVE COCCI IN PAIRS IN CHAINS CRITICAL RESULT CALLED TO, READ BACK BY AND VERIFIED WITH: TValinda Party.D. 17:20 05/23/16 ([email or web address]) IN PEDIATRIC BOTTLE    Culture (A)  Final    ENTEROCOCCUS SPECIES SUSCEPTIBILITIES PERFORMED ON PREVIOUS CULTURE WITHIN THE LAST 5 DAYS.    Report Status 05/25/2016 FINAL  Final  Blood culture (routine x 2)     Status: Abnormal   Collection Time: 05/22/16 10:18 PM  Result Value Ref Range Status   Specimen Description BLOOD LEFT FOREARM  Final   Special Requests BOTTLES DRAWN AEROBIC AND ANAEROBIC 5ML  Final   Culture  Setup Time   Final    GRAM POSITIVE COCCOBACILLUS IN BOTH AEROBIC AND ANAEROBIC BOTTLES CRITICAL RESULT CALLED TO, READ BACK BY AND VERIFIED WITH: T EGAN,PHARMD AT 1720 05/23/16 BY M Kadar    Culture ENTEROCOCCUS SPECIES (A)  Final   Report Status 05/25/2016 FINAL  Final   Organism ID, Bacteria ENTEROCOCCUS SPECIES  Final      Susceptibility   Enterococcus species - MIC*    AMPICILLIN <=2 SENSITIVE Sensitive     VANCOMYCIN 1 SENSITIVE Sensitive     GENTAMICIN SYNERGY SENSITIVE Sensitive     * ENTEROCOCCUS SPECIES  Blood Culture ID Panel (Reflexed)     Status: Abnormal    Collection Time: 05/22/16 10:18 PM  Result Value Ref Range Status   Enterococcus species DETECTED (A) NOT DETECTED Final    Comment: CRITICAL RESULT CALLED TO, READ BACK BY AND VERIFIED WITH: TValinda Party.D. 17:20 05/23/16 ([email or web address])    Vancomycin resistance NOT DETECTED NOT DETECTED Final   Listeria monocytogenes NOT DETECTED NOT DETECTED Final   Staphylococcus species NOT DETECTED NOT DETECTED Final   Staphylococcus aureus NOT DETECTED NOT DETECTED Final   Methicillin resistance NOT DETECTED NOT DETECTED Final   Streptococcus species NOT DETECTED NOT DETECTED Final   Streptococcus agalactiae NOT DETECTED NOT DETECTED Final   Streptococcus pneumoniae NOT DETECTED NOT DETECTED Final   Streptococcus pyogenes NOT DETECTED NOT DETECTED Final   Acinetobacter baumannii NOT DETECTED NOT DETECTED Final   Enterobacteriaceae species NOT DETECTED NOT DETECTED Final   Enterobacter cloacae complex NOT DETECTED NOT DETECTED Final   Escherichia coli NOT DETECTED NOT DETECTED Final   Klebsiella oxytoca NOT DETECTED NOT DETECTED Final   Klebsiella pneumoniae NOT DETECTED NOT DETECTED Final   Proteus species NOT DETECTED NOT DETECTED Final   Serratia marcescens NOT DETECTED NOT DETECTED Final   Carbapenem resistance NOT DETECTED NOT DETECTED Final   Haemophilus influenzae NOT DETECTED NOT DETECTED Final   Neisseria meningitidis NOT DETECTED NOT DETECTED Final   Pseudomonas aeruginosa NOT DETECTED NOT DETECTED Final   Candida albicans NOT DETECTED NOT DETECTED Final   Candida glabrata NOT DETECTED NOT DETECTED Final   Candida krusei NOT DETECTED NOT DETECTED Final   Candida parapsilosis NOT DETECTED NOT DETECTED Final   Candida tropicalis NOT DETECTED NOT DETECTED Final  MRSA PCR Screening     Status: None   Collection Time: 05/23/16  1:17 AM  Result Value Ref Range Status   MRSA by PCR NEGATIVE NEGATIVE Final    Comment:        The GeneXpert MRSA Assay (FDA approved for NASAL specimens only), is  one component of a comprehensive MRSA colonization surveillance program. It is not intended to diagnose MRSA infection nor to guide or monitor treatment for MRSA infections.   Culture, blood (Routine X 2) w Reflex to ID Panel     Status: None (Preliminary result)   Collection Time: 05/24/16  4:54 AM  Result Value Ref Range Status   Specimen Description BLOOD RIGHT ARM  Final   Special Requests AEROBIC BOTTLE ONLY 5ML  Final   Culture NO GROWTH 4 DAYS  Final   Report Status PENDING  Incomplete  Culture, blood (Routine X 2) w Reflex to ID Panel     Status: None (Preliminary result)   Collection Time: 05/24/16  4:56 AM  Result Value Ref Range Status   Specimen Description BLOOD RIGHT ARM  Final   Special Requests AEROBIC BOTTLE ONLY 3ML  Final   Culture NO GROWTH 4 DAYS  Final   Report Status PENDING  Incomplete  Aerobic/Anaerobic Culture (surgical/deep wound)     Status: None (Preliminary result)   Collection Time: 05/26/16  4:07 PM  Result Value Ref Range Status   Specimen Description TISSUE  Final   Special Requests LUMBAR L2 OF BACK  Final   Gram Stain   Final    ABUNDANT WBC PRESENT,BOTH PMN AND MONONUCLEAR NO ORGANISMS SEEN    Culture   Final    CULTURE REINCUBATED FOR BETTER GROWTH NO ANAEROBES ISOLATED; CULTURE IN PROGRESS FOR 5 DAYS    Report Status PENDING  Incomplete     Scheduled Meds: . amiodarone  100 mg Oral Daily  . ampicillin (OMNIPEN) IV  2 g Intravenous Q6H  . cefTRIAXone (ROCEPHIN)  IV  2 g Intravenous Q12H  . levothyroxine  25 mcg Oral QAC breakfast  . simvastatin  20 mg Oral QHS  . warfarin  2.5 mg Oral ONCE-1800  . Warfarin - Pharmacist Dosing Inpatient   Does not apply q1800   Continuous Infusions: . heparin 1,100 Units/hr (05/28/16 0308)  . sodium chloride 0.9 % 1,000 mL infusion      Procedures/Studies: Ct Head Wo Contrast  05/22/2016  CLINICAL DATA:  Back pain, leg pain EXAM: CT HEAD WITHOUT CONTRAST TECHNIQUE: Contiguous axial images were  obtained from the base of the skull through the vertex without intravenous contrast. COMPARISON:  09/21/2011 FINDINGS: Brain: No evidence of acute infarction, hemorrhage, extra-axial collection, ventriculomegaly, or mass effect. There is an area of encephalomalacia in the right parietal lobe. 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 Generalized cerebral atrophy. Periventricular white matter low attenuation likely secondary to microangiopathy. Vascular: Cerebrovascular atherosclerotic calcifications are noted. Skull: Negative for fracture or focal lesion. Sinuses/Orbits: Visualized portions of the orbits are unremarkable. Visualized portions of the paranasal sinuses and mastoid air cells are unremarkable. Other: None. IMPRESSION: 1. No acute intracranial pathology. 2. Chronic microvascular disease and cerebral atrophy. Electronically Signed   By: Kathreen Devoid   On: 05/22/2016 21:17   Ct Lumbar Spine Wo Contrast  05/22/2016  CLINICAL DATA:  Pain post fall EXAM: CT LUMBAR SPINE WITHOUT CONTRAST TECHNIQUE: Multidetector CT imaging of the lumbar spine was performed without intravenous contrast administration. Multiplanar CT image reconstructions were also generated. COMPARISON:  CT 10/26/2007 FINDINGS: Five non rib bearing lobar is segments labeled L1-L5. T11-12: Bridging anterior endplate osteophytes. Osteopenia. Central canal and foramina patent. T12-L1: Bridging anterior osteophytes. Central canal and foramina patent. L1-2: Compression fracture deformity involving inferior endplate of L1 with a estimated 50% loss of height, more severe right than left. Additionally, there is burst fracture of L2 with greater than 50% loss of height anteriorly left greater than right. Fracture at L2 extends to involve the pedicles bilaterally, minimally displaced. Approximately 4 mm retropulsion at the level of the superior endplate of L2. There is a small amount of gas in the interspace extending up into the  fractured component of L1. No unexpected soft tissue component. L2-3:  Mild disc bulge.  Mild bilateral facet DJD. L3-4:  Disc bulge.  Bilateral facet DJD.  Probable spinal stenosis. L4-5: Disc narrowing. Disc bulge. Grade 1 anterolisthesis probably secondary to the moderate facet DJD. Probable spinal stenosis. L5-S1:  Asymmetric disc narrowing.  Mild bulge.  Facet DJD. Fusion across bilateral sacroiliac joints. Patchy aortoiliac arterial calcifications without aneurysm. Cardiac pacing leads partially visualized. IMPRESSION: 1. Compression fracture of L1 and burst fracture of L2,  as detailed above, probably posttraumatic. No other findings to suggest discitis or metastatic involvement. Findings were reviewed with Dr. Margo Aye, who concurs. 2. Spondylitic changes L2 to S1, with probable spinal stenosis L3-4 and L4-5. Electronically Signed   By: Corlis Leak M.D.   On: 05/22/2016 18:05   Ct Hip Right Wo Contrast  05/22/2016  CLINICAL DATA:  Fall today. Severe lower back pain and right hip pain EXAM: CT OF THE RIGHT HIP WITHOUT CONTRAST TECHNIQUE: Multidetector CT imaging of the right hip was performed according to the standard protocol. Multiplanar CT image reconstructions were also generated. COMPARISON:  03/16/2016 FINDINGS: Negative for fracture no significant effusion. Minor spurring from the femoral head and superior acetabulum. Right superior and inferior ischiopubic rami intact. Fusion across the right SI joint. Patchy iliac arterial calcifications without aneurysm. Urinary bladder is distended. IMPRESSION: 1. Negative.  No hip fracture. Electronically Signed   By: Corlis Leak M.D.   On: 05/22/2016 18:08   Ir Fluoro Guide Ndl Plmt / Bx  05/28/2016  INDICATION: Severe low back pain due to L2 fracture.  Osteomyelitis. EXAM: IR FLUORO GUIDE NEEDLE PLACEMENT /BIOPSY AT L2: MEDICATIONS: Versed 1.5 mg IV.  Fentanyl 50 mcg IV. ANESTHESIA/SEDATION: Moderate (conscious) sedation was employed during this procedure. A  total of Versed 1.5 mg and Fentanyl 50 mcg was administered intravenously. Moderate Sedation Time: 15 minutes. The patient's level of consciousness and vital signs were monitored continuously by radiology nursing throughout the procedure under my direct supervision. FLUOROSCOPY TIME:  Fluoroscopy Time: 3 minutes 48 seconds (187 mGy). COMPLICATIONS: None immediate. PROCEDURE: Informed written consent was obtained from the patient after a thorough discussion of the procedural risks, benefits and alternatives. All questions were addressed. Maximal Sterile Barrier Technique was utilized including caps, mask, sterile gowns, sterile gloves, sterile drape, hand hygiene and skin antiseptic. A timeout was performed prior to the initiation of the procedure. Following a full explanation of the procedure along with the potential associated complications, an informed witnessed consent was obtained. The skin overlying the lumbar region was then prepped and draped in the usual sterile fashion. The right pedicle at L2 was then identified and infiltrated with 0.25% bupivacaine including the overlying skin. Using biplane intermittent fluoroscopy, an 11 gauge Jamshidi needle was then advanced to the right pedicle into the posterior 1/3 at L2. This was then exchanged out for a Kyphon advanced osteo introducer system comprised of a working cannula and a Kyphon osteo drill. This combination was then advanced over a Kyphon osteo bone pin until the working cannula was in the posterior 1/3 at L2. At this time the osteo drill was removed. The core samples obtained were sent for pathologic analysis Two passes were then made with a Kyphon bone biopsy device in different directions. Using a 20 mL syringe, core biopsies were obtained and sent for microbiological and pathologic analysis Hemostasis was achieved at the skin entry site following removal of the working cannula. The patient tolerated the procedure well. There were no acute  complications. IMPRESSION: Status post fluoroscopic guided deep core biopsies at L2 as described above. Electronically Signed   By: Julieanne Cotton M.D.   On: 05/26/2016 17:04   Dg Chest Port 1 View  05/23/2016  CLINICAL DATA:  Low back pain, progressively worsening since fall April 2017. Cough. EXAM: PORTABLE CHEST 1 VIEW COMPARISON:  Chest CT 09/12/2013 FINDINGS: Right pacer in place with leads in the right atrium and right ventricle. Heart is normal size. No confluent airspace opacities. Scattered aortic  calcifications. No acute bony abnormality. IMPRESSION: No active cardiopulmonary disease. Aortic atherosclerosis. Electronically Signed   By: Rolm Baptise M.D.   On: 05/23/2016 09:01    HONGALGI,ANAND, MD, FACP, FHM. [facility] Pager [phone]  If 7PM-7AM, please contact night-coverage [email or web address] Password [facility] 05/28/2016, 5:31 PM   LOS: 6 days

## 2016-05-28 NOTE — Clinical Social Work Placement (Signed)
   CLINICAL SOCIAL WORK PLACEMENT  NOTE  Date:  05/28/2016  Patient Details  Name: [name] MRN: [id number] Date of Birth: 1924-04-08  Clinical Social Work is seeking post-discharge placement for this patient at the [facility] level of care (*CSW will initial, date and re-position this form in  chart as items are completed):  Yes   Patient/family provided with [place] Work Department's list of facilities offering this level of care within the geographic area requested by the patient (or if unable, by the patient's family).  Yes   Patient/family informed of their freedom to choose among providers that offer the needed level of care, that participate in Medicare, Medicaid or managed care program needed by the patient, have an available bed and are willing to accept the patient.  Yes   Patient/family informed of [facility]'s ownership interest in [facility] and [facility], as well as of the fact that they are under no obligation to receive care at these facilities.  PASRR submitted to EDS on       PASRR number received on       Existing PASRR number confirmed on 05/28/16     FL2 transmitted to all facilities in geographic area requested by pt/family on 05/28/16     FL2 transmitted to all facilities within larger geographic area on       Patient informed that his/her managed care company has contracts with or will negotiate with certain facilities, including the following:            Patient/family informed of bed offers received.  Patient chooses bed at       Physician recommends and patient chooses bed at      Patient to be transferred to   on  .  Patient to be transferred to facility by       Patient family notified on   of transfer.  Name of family member notified:        PHYSICIAN Please sign DNR, Please sign FL2     Additional Comment:    _______________________________________________ Benard Halsted,  [place] 05/28/2016, 2:18 PM

## 2016-05-28 NOTE — Care Management Important Message (Signed)
Important Message  Patient Details  Name: [name] MRN: [id number] Date of Birth: October 16, 1924   Medicare Important Message Given:  Yes    [name] 05/28/2016, 10:10 AM

## 2016-05-28 NOTE — Progress Notes (Signed)
[facility] has been requested to perform a transesophageal echocardiogram on 05/29/16 at 15:00 for bacteremia.  After careful review of history and examination, the risks and benefits of transesophageal echocardiogram have been explained including risks of esophageal damage, perforation (1:10,000 risk), bleeding, pharyngeal hematoma as well as other potential complications associated with conscious sedation including aspiration, arrhythmia, respiratory failure and death. Alternatives to treatment were discussed, questions were answered. Patient is willing to proceed.  TEE - Dr. Aundra Dubin @ 15:00 . NPO after midnight. Meds with sips ok  Arbutus Leas, NP 05/28/2016 3:12 PM

## 2016-05-28 NOTE — Evaluation (Signed)
Physical Therapy Evaluation Patient Details Name: [name] MRN: [id number] DOB: 09-19-1924 Today's Date: 05/28/2016   History of Present Illness  Patient is a 82 y/o male with hx of SSS, pacemaker, HTN, A-fib, ataxia, TIA, falls presents with worsening back pain and confusion. Recently admitted admitted to [facility] from 04/16-04/18 s/p mechanical fall resulting in right hip pain and tx to SNF. CT of the lumbar spine-L1 compression fx and burst fracture of L2 with ESR 140. Biopsy performed 6/26, awaiting results. Prior to this found to have Enterococcus Bacteremia and diskitis.  Clinical Impression  Patient presents with pain, generalized weakness, balance deficits and impaired mobility s/p above. Increased pain with any movement. Requires assist of 2 for bed mobility and transfers to get to chair. Increased time for all movement. Pt has been non ambulatory for a few weeks per family and has needed assist transferring to w/c. Would benefit from short term SNF to maximize independence, mobility and ease burden of care. Will follow.    Follow Up Recommendations SNF;Supervision for mobility/OOB    Equipment Recommendations  None recommended by PT    Recommendations for Other Services       Precautions / Restrictions Precautions Precautions: Fall;Back Precaution Booklet Issued: No Precaution Comments: Reviewed back precautions. Required Braces or Orthoses: Spinal Brace Spinal Brace: Applied in supine position;Thoracolumbosacral orthotic Restrictions Weight Bearing Restrictions: No      Mobility  Bed Mobility Overal bed mobility: Needs Assistance Bed Mobility: Rolling;Sidelying to Sit Rolling: Max assist Sidelying to sit: +2 for physical assistance;Max assist       General bed mobility comments: Rolling to left to donn TLSO, cues to reach for rail, Max A with lots of pain. Cues for log roll, assist to bring BLEs to EOB, scoot bottom and elevate trunk with step by step cues for  technique.  Transfers Overall transfer level: Needs assistance   Transfers: Squat Pivot Transfers     Squat pivot transfers: Total assist;+2 physical assistance     General transfer comment: Assist of 2 to squat pivot transfer to chair with cues for hand placement/technique. Not able to stand upright due to weakness in bil knees. Therapist stabilizing bil knees for support.  Ambulation/Gait                Stairs            Wheelchair Mobility    Modified Rankin (Stroke Patients Only)       Balance Overall balance assessment: Needs assistance;History of Falls Sitting-balance support: Feet supported;Bilateral upper extremity supported Sitting balance-Leahy Scale: Poor Sitting balance - Comments: Requires Min-Mod A for static sitting balance with left lateral trunk lean. Postural control: Left lateral lean Standing balance support: During functional activity Standing balance-Leahy Scale: Zero                               Pertinent Vitals/Pain Pain Assessment: Faces Faces Pain Scale: Hurts worst Pain Location: back with movement Pain Descriptors / Indicators: Grimacing;Guarding;Sore;Sharp Pain Intervention(s): Monitored during session;Premedicated before session;Repositioned;Limited activity within patient's tolerance    Home Living Family/patient expects to be discharged to:: Skilled nursing facility Living Arrangements: Spouse/significant other                    Prior Function Level of Independence: Needs assistance   Gait / Transfers Assistance Needed: Pt non ambulatory for the last few weeks, requiring heavy assist to transfer to/from w/c and get  in/out of bed.   ADL's / Homemaking Assistance Needed: Assist with ADLs. using diapers recently at home.  Comments: Son has been helping care for pt at home but can no longer provide necessary assist.      Hand Dominance        Extremity/Trunk Assessment   Upper Extremity  Assessment: Defer to OT evaluation           Lower Extremity Assessment: Generalized weakness (Limited AROM BLEs, grossly ~2/5 knee extension, testing limited due to pain.)         Communication   Communication: HOH  Cognition Arousal/Alertness: Awake/alert Behavior During Therapy: WFL for tasks assessed/performed Overall Cognitive Status: No family/caregiver present to determine baseline cognitive functioning       Memory: Decreased short-term memory              General Comments General comments (skin integrity, edema, etc.): Wife and son present towards end of session.    Exercises        Assessment/Plan    PT Assessment Patient needs continued PT services  PT Diagnosis Generalized weakness;Acute pain   PT Problem List Decreased strength;Pain;Decreased cognition;Decreased range of motion;Decreased balance;Decreased mobility;Decreased activity tolerance;Decreased knowledge of precautions  PT Treatment Interventions Balance training;Functional mobility training;Therapeutic activities;Therapeutic exercise;Wheelchair mobility training;Patient/family education;Gait training;DME instruction   PT Goals (Current goals can be found in the Care Plan section) Acute Rehab PT Goals Patient Stated Goal: none stated PT Goal Formulation: With patient Time For Goal Achievement: 06/11/16 Potential to Achieve Goals: Fair    Frequency Min 2X/week   Barriers to discharge Decreased caregiver support;Inaccessible home environment      Co-evaluation               End of Session Equipment Utilized During Treatment: Gait belt;Back brace Activity Tolerance: Patient limited by pain Patient left: in chair;with call bell/phone within reach;with family/visitor present Nurse Communication: Mobility status;Need for lift equipment         Time: 1100-1135 PT Time Calculation (min) (ACUTE ONLY): 35 min   Charges:   PT Evaluation $PT Eval Moderate Complexity: 1  Procedure PT Treatments $Therapeutic Activity: 8-22 mins   PT G Codes:        Sang Blount A Leiliana Foody 05/28/2016, 12:23 PM Wray Kearns, [place], DPT [phone]

## 2016-05-28 NOTE — Progress Notes (Signed)
    [facility] for Infectious Disease    Date of Admission:  05/22/2016   Total days of antibiotics 7        Day 3 ampicillin        Day 3 ceftriaxone           ID: [name] is a 82 y.o. male with  Enterococcal bacteremia and discitis Principal Problem:   Enterococcal bacteremia Active Problems:   Hypothyroidism   Hyperlipidemia   HYPERTENSION, BENIGN ESSENTIAL   ATRIAL FIBRILLATION   SICK SINUS SYNDROME   PERIPHERAL VASCULAR DISEASE   BPH (benign prostatic hyperplasia)   PACEMAKER, PERMANENT   Right hip pain   Elevated C-reactive protein (CRP)   Altered mental status   Compression fracture of L2 lumbar vertebra with delayed healing   Acute encephalopathy    Subjective: Low grade fever with some solomnence. Family reports that he is not as interactive as yesterday  Medications:  . amiodarone  100 mg Oral Daily  . ampicillin (OMNIPEN) IV  2 g Intravenous Q6H  . cefTRIAXone (ROCEPHIN)  IV  2 g Intravenous Q12H  . levothyroxine  25 mcg Oral QAC breakfast  . simvastatin  20 mg Oral QHS  . Warfarin - Pharmacist Dosing Inpatient   Does not apply q1800    Objective: Vital signs in last 24 hours: Temp:  [97.5 F (36.4 C)-99.7 F (37.6 C)] 99.7 F (37.6 C) (06/28 1830) Pulse Rate:  [66-96] 84 (06/28 1830) Resp:  [16] 16 (06/28 1830) BP: (126-164)/(61-98) 126/69 mmHg (06/28 1830) SpO2:  [96 %-100 %] 96 % (06/28 1830) Physical Exam  Constitutional: He is oriented to person, place, and time. He appears well-developed and well-nourished. No distress.  HENT: edentulous Mouth/Throat: Oropharynx is clear and moist. No oropharyngeal exudate.  Cardiovascular: Normal rate, regular rhythm and normal heart sounds. Exam reveals no gallop and no friction rub.  No murmur heard.  Pulmonary/Chest: Effort normal and breath sounds normal. No respiratory distress. He has no wheezes.  Abdominal: Soft. Bowel sounds are normal. He exhibits no distension. There is no tenderness.    Skin: Skin is warm and dry. No rash noted. No erythema.  Psychiatric: He has a normal mood and affect. His behavior is normal.     Lab Results  Recent Labs  05/26/16 0546  NA 134*  K 4.0  CL 100*  CO2 28  BUN 10  CREATININE 1.06   Lab Results  Component Value Date   ESRSEDRATE >140* 05/23/2016   Lab Results  Component Value Date   CRP 16.1* 05/23/2016    Microbiology: 6/26 tissue cx reincubating 6/24 blood cx ngtd 6/22 blood cx enterococcus (amp) Studies/Results: No results found.   Assessment/Plan: Enterococcal bacteremia complicated with discitis = continue on ampicillin plus ceftriaxone. Would recommend to get TEE if can be safely done to see if any endocarditis.   Discitis = will follow up on culture to see if same isolate causing infection  Deconditioning = have pt/ot evaluation  Back pain = agree with getting TLSO brace to help with pain/support  Baxter Flattery [facility] for Infectious Diseases Cell: [phone] Pager: [phone]  05/28/2016, 7:48 PM

## 2016-05-29 ENCOUNTER — Telehealth: Payer: Self-pay | Admitting: Internal Medicine

## 2016-05-29 ENCOUNTER — Inpatient Hospital Stay (HOSPITAL_COMMUNITY): Payer: Medicare Other

## 2016-05-29 ENCOUNTER — Encounter (HOSPITAL_COMMUNITY): Payer: Self-pay | Admitting: *Deleted

## 2016-05-29 ENCOUNTER — Encounter (HOSPITAL_COMMUNITY)
Admission: EM | Disposition: A | Discharge status: Skilled Nursing Facility | Payer: Self-pay | Source: Home / Self Care | Attending: Internal Medicine

## 2016-05-29 DIAGNOSIS — I350 Nonrheumatic aortic (valve) stenosis: Secondary | ICD-10-CM

## 2016-05-29 DIAGNOSIS — I341 Nonrheumatic mitral (valve) prolapse: Secondary | ICD-10-CM

## 2016-05-29 DIAGNOSIS — G9340 Encephalopathy, unspecified: Secondary | ICD-10-CM

## 2016-05-29 HISTORY — PX: TEE WITHOUT CARDIOVERSION: SHX5443

## 2016-05-29 LAB — DIFFERENTIAL
BASOS ABS: 0 10*3/uL (ref 0.0–0.1)
BASOS PCT: 0 %
Eosinophils Absolute: 0 10*3/uL (ref 0.0–0.7)
Eosinophils Relative: 0 %
LYMPHS ABS: 2.8 10*3/uL (ref 0.7–4.0)
LYMPHS PCT: 22 %
MONOS PCT: 9 %
Monocytes Absolute: 1.2 10*3/uL — ABNORMAL HIGH (ref 0.1–1.0)
NEUTROS PCT: 69 %
Neutro Abs: 8.8 10*3/uL — ABNORMAL HIGH (ref 1.7–7.7)

## 2016-05-29 LAB — CULTURE, BLOOD (ROUTINE X 2)
Culture: NO GROWTH
Culture: NO GROWTH

## 2016-05-29 LAB — CBC
HCT: 27.6 % — ABNORMAL LOW (ref 39.0–52.0)
Hemoglobin: 9 g/dL — ABNORMAL LOW (ref 13.0–17.0)
MCH: 30.7 pg (ref 26.0–34.0)
MCHC: 32.6 g/dL (ref 30.0–36.0)
MCV: 94.2 fL (ref 78.0–100.0)
PLATELETS: 317 10*3/uL (ref 150–400)
RBC: 2.93 MIL/uL — ABNORMAL LOW (ref 4.22–5.81)
RDW: 14.4 % (ref 11.5–15.5)
WBC: 12.8 10*3/uL — AB (ref 4.0–10.5)

## 2016-05-29 LAB — HEPARIN LEVEL (UNFRACTIONATED): Heparin Unfractionated: 0.47 IU/mL (ref 0.30–0.70)

## 2016-05-29 LAB — PROTIME-INR
INR: 1.6 — ABNORMAL HIGH (ref 0.00–1.49)
Prothrombin Time: 19.1 seconds — ABNORMAL HIGH (ref 11.6–15.2)

## 2016-05-29 SURGERY — ECHOCARDIOGRAM, TRANSESOPHAGEAL
Anesthesia: Moderate Sedation

## 2016-05-29 MED ORDER — BUTAMBEN-TETRACAINE-BENZOCAINE 2-2-14 % EX AERO
INHALATION_SPRAY | CUTANEOUS | Status: DC | PRN
Start: 2016-05-29 — End: 2016-05-29
  Administered 2016-05-29: 2 via TOPICAL

## 2016-05-29 MED ORDER — MIDAZOLAM HCL 10 MG/2ML IJ SOLN
INTRAMUSCULAR | Status: DC | PRN
  Administered 2016-05-29 (×2): 1 mg via INTRAVENOUS

## 2016-05-29 MED ORDER — FENTANYL CITRATE (PF) 100 MCG/2ML IJ SOLN
INTRAMUSCULAR | Status: AC
  Filled 2016-05-29: qty 2

## 2016-05-29 MED ORDER — SODIUM CHLORIDE 0.9 % IV SOLN: INTRAVENOUS | Status: DC

## 2016-05-29 MED ORDER — FENTANYL CITRATE (PF) 100 MCG/2ML IJ SOLN
INTRAMUSCULAR | Status: DC | PRN
  Administered 2016-05-29 (×2): 12.5 ug via INTRAVENOUS

## 2016-05-29 MED ORDER — WARFARIN SODIUM 5 MG PO TABS
5.0000 mg | ORAL_TABLET | Freq: Once | ORAL | Status: AC
  Administered 2016-05-29: 5 mg via ORAL
  Filled 2016-05-29: qty 1

## 2016-05-29 MED ORDER — POLYETHYLENE GLYCOL 3350 17 G PO PACK
17.0000 g | PACK | Freq: Two times a day (BID) | ORAL | Status: DC
  Administered 2016-05-29 – 2016-06-01 (×6): 17 g via ORAL
  Filled 2016-05-29 (×8): qty 1

## 2016-05-29 MED ORDER — SENNA 8.6 MG PO TABS
2.0000 | ORAL_TABLET | Freq: Every day | ORAL | Status: DC
  Administered 2016-05-30 – 2016-06-02 (×4): 17.2 mg via ORAL
  Filled 2016-05-29 (×4): qty 2

## 2016-05-29 MED ORDER — SODIUM CHLORIDE 0.9 % IV BOLUS (SEPSIS)
250.0000 mL | Freq: Once | INTRAVENOUS | Status: AC
  Administered 2016-05-29: 250 mL via INTRAVENOUS

## 2016-05-29 MED ORDER — MIDAZOLAM HCL 5 MG/ML IJ SOLN
INTRAMUSCULAR | Status: AC
  Filled 2016-05-29: qty 2

## 2016-05-29 NOTE — Care Management Note (Signed)
Case Management Note  Patient Details  Name: [name] MRN: [id number] Date of Birth: 1924/05/24  Subjective/Objective:                    Action/Plan: Pt scheduled for TEE today. Continues on IV abx. CM following for d/c needs.   Expected Discharge Date:                  Expected Discharge Plan:  Skilled Nursing Facility  In-House Referral:  Clinical Social Work  Discharge planning Services  CM Consult  Post Acute Care Choice:    Choice offered to:     DME Arranged:    DME Agency:     HH Arranged:    [place] Agency:     Status of Service:  In process, will continue to follow  If discussed at Long Length of Stay Meetings, dates discussed:    Additional Comments:  Pollie Friar, RN 05/29/2016, 11:49 AM

## 2016-05-29 NOTE — Interval H&P Note (Signed)
History and Physical Interval Note:  05/29/2016 2:56 PM  [name]  has presented today for surgery, with the diagnosis of BACTEREMIA  The various methods of treatment have been discussed with the patient and family. After consideration of risks, benefits and other options for treatment, the patient has consented to  Procedure(s): TRANSESOPHAGEAL ECHOCARDIOGRAM (TEE) (N/A) as a surgical intervention .  The patient's history has been reviewed, patient examined, no change in status, stable for surgery.  I have reviewed the patient's chart and labs.  Questions were answered to the patient's satisfaction.     Dalton [employer]

## 2016-05-29 NOTE — Telephone Encounter (Signed)
Returned call to patient and let her know that Dr Jackalyn Lombard partner is doing the TEE.  She ios aware now as the MD who rounded today let her know.

## 2016-05-29 NOTE — Progress Notes (Signed)
ANTICOAGULATION CONSULT NOTE - Follow Up Consult  Pharmacy Consult for heparin>>warfarin Indication: atrial fibrillation  Allergies  Allergen Reactions  . Guaifenesin Er Palpitations    Pt can not take mucinex PM  . Prednisone Other (See Comments)    HIGH doses make patient feel "crazy"    Patient Measurements: Height: 5\' 8"  (172.7 cm) Weight: 150 lb 9.2 oz (68.3 kg) IBW/kg (Calculated) : 68.4 Heparin Dosing Weight: 68.3 kg  Vital Signs: Temp: 97.8 F (36.6 C) (06/29 0913) Temp Source: Oral (06/29 0913) BP: 146/90 mmHg (06/29 0913) Pulse Rate: 75 (06/29 0913)  Labs:  Recent Labs  05/27/16 0327  05/27/16 1953 05/28/16 0537 05/28/16 0955 05/29/16 0545  HGB  --   --   --   --   --  9.0*  HCT  --   --   --   --   --  27.6*  PLT  --   --   --   --   --  317  LABPROT 16.8*  --   --   --  21.0* 19.1*  INR 1.35  --   --   --  1.82* 1.60*  HEPARINUNFRC <0.10*  < > 0.49 0.50  --  0.47  < > = values in this interval not displayed.  Estimated Creatinine Clearance: 45.6 mL/min (by C-G formula based on Cr of 1.06).  Assessment: 80 yoM with hx AFib on chronic anticoagulation with warfarin. Pharmacy consulted to dose heparin/warfarin. PTA warfarin dose 5 mg MWF, 2.5 mg all other days (last dose PTA 6/22)  Heparin level this morning is therapeutic at 0.47, will continue current rate and f/u daily heparin level.  Restarted warfarin 6/27 after holding for biopsy. Pt s/p vitamin K PO on 6/24 and 6/25. INR subtherapeutic at 1.6 today. CBC stable, no bleeding noted.  Goal of Therapy:  INR 2-3 Heparin level 0.3-0.7 units/ml Monitor platelets by anticoagulation protocol: Yes   Plan:  Continue heparin infusion at 1100 units/hr Check anti-Xa level daily while on heparin Continue to monitor H&H, and platelets  Give warfarin 5 mg x 1 dose Monitor daily INR, CBC, clinical course, s/sx of bleed, PO intake, DDI   Thank you for allowing [place] to participate in this patients  care. Jens Som, PharmD Pager: [phone] 05/29/2016,10:50 AM

## 2016-05-29 NOTE — H&P (View-Only) (Signed)
[facility] has been requested to perform a transesophageal echocardiogram on 05/29/16 at 15:00 for bacteremia.  After careful review of history and examination, the risks and benefits of transesophageal echocardiogram have been explained including risks of esophageal damage, perforation (1:10,000 risk), bleeding, pharyngeal hematoma as well as other potential complications associated with conscious sedation including aspiration, arrhythmia, respiratory failure and death. Alternatives to treatment were discussed, questions were answered. Patient is willing to proceed.  TEE - Dr. Aundra Dubin @ 15:00 . NPO after midnight. Meds with sips ok  Arbutus Leas, NP 05/28/2016 3:12 PM

## 2016-05-29 NOTE — Telephone Encounter (Signed)
New message     The daughter is calling to see if it ok the do a scope this afternoon at 3 pm, the pt wants to make sure Dr. Rayann Heman gives the ok to do the procedure.  The daughter states the pt has a infection so the Md at the hospital scheduled a scope today to see what's going on  The pt has a pacemaker.

## 2016-05-29 NOTE — CV Procedure (Signed)
Procedure: TEE  Indication: Bacteremia, assess for endocarditis  Sedation: Versed 2 mg IV, Fentanyl 25 mcg IV  Findings: Please see echo section for full report.  Normal LV size with mild LV hypertrophy.  EF 60-65%.  Normal RV size and systolic function.  Mild left atrial enlargement, no LA appendage thrombus.  Normal right atrium.  Pacemaker leads seen in right heart, no vegetation noted on pacemaker leads.  Trivial TR, no vegetation noted.  Trivial PI, no vegetation noted.  The aortic valve was severely calcified and trileaflet.  There was mild aortic insufficiency.  There was mild stenosis by mean gradient (11 mmHg) though visually, stenosis looked moderate.  No vegetation.  The ascending aorta was mildly dilated at 3.9 cm.  The mitral valve was thickened and myxomatous.  I do not think there was a vegetation as nothing was mobile.  There was mild bileaflet prolapse.  There was eccentric, moderate to moderate-severe mitral regurgitation.  No PFO or ASD seen.   Impression: No evidence for endocarditis.  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  Mild to moderate aortic stenosis.   [name] 05/29/2016 3:24 PM

## 2016-05-29 NOTE — Progress Notes (Signed)
PROGRESS NOTE  [name] YTK:[id number] DOB: 11/24/1924 DOA: 05/22/2016 PCP: Merrilee Seashore, MD  Brief History:  82 year old male with a history of atrial fibrillation, TIA, hypertension, sick sinus syndrome status post PPM presented with worsening low back pain and confusion. The patient was admitted to [facility] from 03/16/2016 through 03/18/2016 after suffering a mechanical fall resulting in increasing right hip pain. Workup including CT of the hip at that time did not reveal a fracture. He was sent to physical nursing facility for rehabilitation. Unfortunately, pt continued to have significant back pain and declining mental status. As a result, the patient was brought to the emergency department. CT of the lumbar spine revealed compression fracture of L1 and burst fracture of L2 with ESR 140. The patient was seen by neurosurgery who felt that the patient was a poor surgical candidate. Interventional radiology was consulted for biopsy which was done on 05/26/16.  Prior to the biopsy, his blood cultures became positive.  Therefore, empiric vancomycin was started.  Assessment/Plan: Enterococcus Bacteremia complicated by discitis -unknown source -IV vanco-->ampicillin + ceftriaxone. Total 7 days antibiotics thus far. -05/22/16 blood culture--enterococcus sp. -5/57/32--KGU--RK 27-06%, no embolic source -2/37/62--[id number] blood cultures--neg to date -05/26/16--L2 biopsy-->reincubation for better growth suggesting positive cultutre--follow -TEE 6/29: No evidence of endocarditis. 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 Mild-to-moderate aortic stenosis. - ID follow-up appreciated: Continue IV ampicillin as continuous infusion and plan to treat for 6-8 weeks (end date 07/08/2016). Will need weekly CBC, CMP, CRP and ESR with results faxed to [phone]. ID will arrange clinic follow-up in 4-6 weeks.  L1-L2 compression fracture/Discitis-due to enterococcal  infection -TLSO brace -Appreciate neurosurgery consultation -case discussed by Dr. Carles Collet with Dr. Shanon Brow Jones-->concerned about ESR 140 -pain control -MR not possible due to PPM -continue oxycodone PRN pain -05/26/16--L2 biopsy-->reincubation for better growth suggesting positive cultutre--follow culture and pathology  Acute encephalopathy -check serum B12--644 -TSH--4.22 -Ammonia--22 -continue amiodarone -Chest x-ray--neg -EKG--sinus, no concerning ST-T changes -UA--no pyuria -continue IVF as po has poor po intake -oxycodone is making pt drowsy. Mental status improved. As discussed with patient's daughter at bedside, mental status at baseline today.  Atrial fibrillation -CHADS-VASc = 5 -restart intravenous heparin bridge 05/26/2016--dicussed with Dr. Estanislado Pandy -05/27/16--restart warfarin. Pharmacy managing heparin bridging and Coumadin. -rate controlled. INR 1.6.  Coaguloapthy--warfarin induced -received vitamin K 5 mg x 2 doses for biopsy  CKD stage 3 -Baseline creatinine 1.0-1.2 -Stable.  Hypothyroidism -Continue Synthroid -TSH 4.299 -recheck TSH in 4 weeks when pt more stable clinically  Hyperlipidemia -continue zocor  SSS -S/p PPM -monitor on tele  Goals of care -discussed with family -05/26/16--change pt to DNR  Constipation - Multifactorial. Adjust bowel regimen.   Disposition Plan: SNF possibly 6/30 if bed available. Family Communication:daughter updated at bedside 6/28 Consultants: Neurosurgery, ID, IR, Cardiology (TEE)  Code Status: DNR  DVT Prophylaxis: Full AC with warfarin and heparin bridging   Procedures: As Listed in Progress Note Above  Antibiotics: Vancomycin 6/23-->6/26 Ampicillin 6/26>>> ceftriazone 6/26>>>    Subjective: Patient seen this morning prior to TEE. Laying comfortably supine in bed. Denied complaints except constipation. Had BM today.  Objective: Filed Vitals:   05/29/16 1525 05/29/16 1532 05/29/16 1540  05/29/16 1550  BP: 88/39 97/42 133/50 107/55  Pulse: 60 60 71 58  Temp:      TempSrc:      Resp: '20 14 19 19  '$ Height:      Weight:      SpO2:  98% 94% 99% 95%    Intake/Output Summary (Last 24 hours) at 05/29/16 1710 Last data filed at 05/29/16 0626  Gross per 24 hour  Intake      0 ml  Output    550 ml  Net   -550 ml    Weight change:  Exam:   General:  Pt is alert, follows commands appropriately, not in acute distress.   HEENT: No icterus, No thrush, No neck mass, [place]/AT  Cardiovascular: RRR, S1/S2, no rubs, no gallops. Not on telemetry.  Respiratory: Slightly diminished breath sounds in the bases but otherwise clear to auscultation. No increased work of breathing.  Abdomen: Soft/+BS, non tender, non distended, no guarding  Extremities: No edema, No lymphangitis, No petechiae, No rashes, no synovitis  CNS: Alert and oriented. No focal deficits.  MSS: Has TLSO brace.   Data Reviewed: I have personally reviewed following labs and imaging studies Basic Metabolic Panel:  Recent Labs Lab 05/23/16 0447 05/25/16 0627 05/26/16 0546  NA 135 137 134*  K 4.8 3.8 4.0  CL 104 103 100*  CO2 '26 27 28  '$ GLUCOSE 121* 138* 114*  BUN '16 13 10  '$ CREATININE 1.16 1.06 1.06  CALCIUM 8.0* 8.3* 8.0*   Liver Function Tests: No results for input(s): AST, ALT, ALKPHOS, BILITOT, PROT, ALBUMIN in the last 168 hours. No results for input(s): LIPASE, AMYLASE in the last 168 hours.  Recent Labs Lab 05/23/16 0857  AMMONIA 22   Coagulation Profile:  Recent Labs Lab 05/25/16 0627 05/26/16 0546 05/27/16 0327 05/28/16 0955 05/29/16 0545  INR 2.37* 1.35 1.35 1.82* 1.60*   CBC:  Recent Labs Lab 05/23/16 0447 05/23/16 0857 05/25/16 0627 05/29/16 0545  WBC 9.0  --  7.8 12.8*  NEUTROABS  --   --   --  8.8*  HGB 8.5*  --  8.9* 9.0*  HCT 27.3* 29.4* 28.3* 27.6*  MCV 94.1  --  93.1 94.2  PLT 225  --  284 317   Cardiac Enzymes: No results for input(s): CKTOTAL, CKMB,  CKMBINDEX, TROPONINI in the last 168 hours. BNP: Invalid input(s): POCBNP CBG:  Recent Labs Lab 05/24/16 1557  GLUCAP 136*   HbA1C: No results for input(s): HGBA1C in the last 72 hours. Urine analysis:    Component Value Date/Time   COLORURINE YELLOW 05/22/2016 1754   APPEARANCEUR CLEAR 05/22/2016 1754   LABSPEC 1.016 05/22/2016 1754   PHURINE 7.0 05/22/2016 1754   GLUCOSEU NEGATIVE 05/22/2016 1754   GLUCOSEU NEGATIVE 09/28/2008 1243   HGBUR NEGATIVE 05/22/2016 1754   BILIRUBINUR NEGATIVE 05/22/2016 1754   KETONESUR NEGATIVE 05/22/2016 1754   PROTEINUR NEGATIVE 05/22/2016 1754   UROBILINOGEN 0.2 mg/dL 09/28/2008 1243   NITRITE NEGATIVE 05/22/2016 1754   LEUKOCYTESUR NEGATIVE 05/22/2016 1754   Sepsis Labs: '@LABRCNTIP'$ (procalcitonin:4,lacticidven:4) ) Recent Results (from the past 240 hour(s))  Blood culture (routine x 2)     Status: Abnormal   Collection Time: 05/22/16 10:12 PM  Result Value Ref Range Status   Specimen Description BLOOD RIGHT HAND  Final   Special Requests IN PEDIATRIC BOTTLE 2ML  Final   Culture  Setup Time   Final    GRAM POSITIVE COCCI IN PAIRS IN CHAINS CRITICAL RESULT CALLED TO, READ BACK BY AND VERIFIED WITH: TValinda Party.D. 17:20 05/23/16 ([email or web address]) IN PEDIATRIC BOTTLE    Culture (A)  Final    ENTEROCOCCUS SPECIES SUSCEPTIBILITIES PERFORMED ON PREVIOUS CULTURE WITHIN THE LAST 5 DAYS.    Report Status 05/25/2016 FINAL  Final  Blood  culture (routine x 2)     Status: Abnormal   Collection Time: 05/22/16 10:18 PM  Result Value Ref Range Status   Specimen Description BLOOD LEFT FOREARM  Final   Special Requests BOTTLES DRAWN AEROBIC AND ANAEROBIC  Final   Culture  Setup Time   Final    GRAM POSITIVE COCCOBACILLUS IN BOTH AEROBIC AND ANAEROBIC BOTTLES CRITICAL RESULT CALLED TO, READ BACK BY AND VERIFIED WITH: T EGAN,PHARMD AT 1720 05/23/16 BY M Bowron    Culture ENTEROCOCCUS SPECIES (A)  Final   Report Status 05/25/2016 FINAL  Final    Organism ID, Bacteria ENTEROCOCCUS SPECIES  Final      Susceptibility   Enterococcus species - MIC*    AMPICILLIN <=2 SENSITIVE Sensitive     VANCOMYCIN 1 SENSITIVE Sensitive     GENTAMICIN SYNERGY SENSITIVE Sensitive     * ENTEROCOCCUS SPECIES  Blood Culture ID Panel (Reflexed)     Status: Abnormal   Collection Time: 05/22/16 10:18 PM  Result Value Ref Range Status   Enterococcus species DETECTED (A) NOT DETECTED Final    Comment: CRITICAL RESULT CALLED TO, READ BACK BY AND VERIFIED WITH: TCarlton Adam.D. 17:20 05/23/16 ([email or web address])    Vancomycin resistance NOT DETECTED NOT DETECTED Final   Listeria monocytogenes NOT DETECTED NOT DETECTED Final   Staphylococcus species NOT DETECTED NOT DETECTED Final   Staphylococcus aureus NOT DETECTED NOT DETECTED Final   Methicillin resistance NOT DETECTED NOT DETECTED Final   Streptococcus species NOT DETECTED NOT DETECTED Final   Streptococcus agalactiae NOT DETECTED NOT DETECTED Final   Streptococcus pneumoniae NOT DETECTED NOT DETECTED Final   Streptococcus pyogenes NOT DETECTED NOT DETECTED Final   Acinetobacter baumannii NOT DETECTED NOT DETECTED Final   Enterobacteriaceae species NOT DETECTED NOT DETECTED Final   Enterobacter cloacae complex NOT DETECTED NOT DETECTED Final   Escherichia coli NOT DETECTED NOT DETECTED Final   Klebsiella oxytoca NOT DETECTED NOT DETECTED Final   Klebsiella pneumoniae NOT DETECTED NOT DETECTED Final   Proteus species NOT DETECTED NOT DETECTED Final   Serratia marcescens NOT DETECTED NOT DETECTED Final   Carbapenem resistance NOT DETECTED NOT DETECTED Final   Haemophilus influenzae NOT DETECTED NOT DETECTED Final   Neisseria meningitidis NOT DETECTED NOT DETECTED Final   Pseudomonas aeruginosa NOT DETECTED NOT DETECTED Final   Candida albicans NOT DETECTED NOT DETECTED Final   Candida glabrata NOT DETECTED NOT DETECTED Final   Candida krusei NOT DETECTED NOT DETECTED Final   Candida parapsilosis NOT DETECTED  NOT DETECTED Final   Candida tropicalis NOT DETECTED NOT DETECTED Final  MRSA PCR Screening     Status: None   Collection Time: 05/23/16  1:17 AM  Result Value Ref Range Status   MRSA by PCR NEGATIVE NEGATIVE Final    Comment:        The GeneXpert MRSA Assay (FDA approved for NASAL specimens only), is one component of a comprehensive MRSA colonization surveillance program. It is not intended to diagnose MRSA infection nor to guide or monitor treatment for MRSA infections.   Culture, blood (Routine X 2) w Reflex to ID Panel     Status: None   Collection Time: 05/24/16  4:54 AM  Result Value Ref Range Status   Specimen Description BLOOD RIGHT ARM  Final   Special Requests AEROBIC BOTTLE ONLY  Final   Culture NO GROWTH 5 DAYS  Final   Report Status 05/29/2016 FINAL  Final  Culture, blood (Routine X 2) w Reflex to  ID Panel     Status: None   Collection Time: 05/24/16  4:56 AM  Result Value Ref Range Status   Specimen Description BLOOD RIGHT ARM  Final   Special Requests AEROBIC BOTTLE ONLY 3ML  Final   Culture NO GROWTH 5 DAYS  Final   Report Status 05/29/2016 FINAL  Final  Aerobic/Anaerobic Culture (surgical/deep wound)     Status: None (Preliminary result)   Collection Time: 05/26/16  4:07 PM  Result Value Ref Range Status   Specimen Description TISSUE  Final   Special Requests LUMBAR L2 OF BACK  Final   Gram Stain   Final    ABUNDANT WBC PRESENT,BOTH PMN AND MONONUCLEAR NO ORGANISMS SEEN    Culture   Final    FEW ENTEROCOCCUS SPECIES NO ANAEROBES ISOLATED; CULTURE IN PROGRESS FOR 5 DAYS    Report Status PENDING  Incomplete   Organism ID, Bacteria ENTEROCOCCUS SPECIES  Final      Susceptibility   Enterococcus species - MIC*    AMPICILLIN <=2 SENSITIVE Sensitive     VANCOMYCIN 1 SENSITIVE Sensitive     GENTAMICIN SYNERGY SENSITIVE Sensitive     * FEW ENTEROCOCCUS SPECIES     Scheduled Meds: . amiodarone  100 mg Oral Daily  . ampicillin (OMNIPEN) IV  2 g  Intravenous Q6H  . levothyroxine  25 mcg Oral QAC breakfast  . simvastatin  20 mg Oral QHS  . warfarin  5 mg Oral ONCE-1800  . Warfarin - Pharmacist Dosing Inpatient   Does not apply q1800   Continuous Infusions: . heparin 1,100 Units/hr (05/29/16 0134)  . sodium chloride 0.9 % 1,000 mL infusion      Procedures/Studies: Ct Head Wo Contrast  05/22/2016  CLINICAL DATA:  Back pain, leg pain EXAM: CT HEAD WITHOUT CONTRAST TECHNIQUE: Contiguous axial images were obtained from the base of the skull through the vertex without intravenous contrast. COMPARISON:  09/21/2011 FINDINGS: Brain: No evidence of acute infarction, hemorrhage, extra-axial collection, ventriculomegaly, or mass effect. There is an area of encephalomalacia in the right parietal lobe. 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 Generalized cerebral atrophy. Periventricular white matter low attenuation likely secondary to microangiopathy. Vascular: Cerebrovascular atherosclerotic calcifications are noted. Skull: Negative for fracture or focal lesion. Sinuses/Orbits: Visualized portions of the orbits are unremarkable. Visualized portions of the paranasal sinuses and mastoid air cells are unremarkable. Other: None. IMPRESSION: 1. No acute intracranial pathology. 2. Chronic microvascular disease and cerebral atrophy. Electronically Signed   By: Kathreen Devoid   On: 05/22/2016 21:17   Ct Lumbar Spine Wo Contrast  05/22/2016  CLINICAL DATA:  Pain post fall EXAM: CT LUMBAR SPINE WITHOUT CONTRAST TECHNIQUE: Multidetector CT imaging of the lumbar spine was performed without intravenous contrast administration. Multiplanar CT image reconstructions were also generated. COMPARISON:  CT 10/26/2007 FINDINGS: Five non rib bearing lobar is segments labeled L1-L5. T11-12: Bridging anterior endplate osteophytes. Osteopenia. Central canal and foramina patent. T12-L1: Bridging anterior osteophytes. Central canal and foramina patent. L1-2:  Compression fracture deformity involving inferior endplate of L1 with a estimated 50% loss of height, more severe right than left. Additionally, there is burst fracture of L2 with greater than 50% loss of height anteriorly left greater than right. Fracture at L2 extends to involve the pedicles bilaterally, minimally displaced. Approximately 4 mm retropulsion at the level of the superior endplate of L2. There is a small amount of gas in the interspace extending up into the fractured component of L1. No unexpected soft  tissue component. L2-3:  Mild disc bulge.  Mild bilateral facet DJD. L3-4:  Disc bulge.  Bilateral facet DJD.  Probable spinal stenosis. L4-5: Disc narrowing. Disc bulge. Grade 1 anterolisthesis probably secondary to the moderate facet DJD. Probable spinal stenosis. L5-S1:  Asymmetric disc narrowing.  Mild bulge.  Facet DJD. Fusion across bilateral sacroiliac joints. Patchy aortoiliac arterial calcifications without aneurysm. Cardiac pacing leads partially visualized. IMPRESSION: 1. Compression fracture of L1 and burst fracture of L2, as detailed above, probably posttraumatic. No other findings to suggest discitis or metastatic involvement. Findings were reviewed with Dr. Nevada Crane, who concurs. 2. Spondylitic changes L2 to S1, with probable spinal stenosis L3-4 and L4-5. Electronically Signed   By: Lucrezia Europe M.D.   On: 05/22/2016 18:05   Ct Hip Right Wo Contrast  05/22/2016  CLINICAL DATA:  Fall today. Severe lower back pain and right hip pain EXAM: CT OF THE RIGHT HIP WITHOUT CONTRAST TECHNIQUE: Multidetector CT imaging of the right hip was performed according to the standard protocol. Multiplanar CT image reconstructions were also generated. COMPARISON:  03/16/2016 FINDINGS: Negative for fracture no significant effusion. Minor spurring from the femoral head and superior acetabulum. Right superior and inferior ischiopubic rami intact. Fusion across the right SI joint. Patchy iliac arterial  calcifications without aneurysm. Urinary bladder is distended. IMPRESSION: 1. Negative.  No hip fracture. Electronically Signed   By: Lucrezia Europe M.D.   On: 05/22/2016 18:08   Ir Fluoro Guide Ndl Plmt / Bx  05/28/2016  INDICATION: Severe low back pain due to L2 fracture.  Osteomyelitis. EXAM: IR FLUORO GUIDE NEEDLE PLACEMENT /BIOPSY AT L2: MEDICATIONS: Versed 1.5 mg IV.  Fentanyl 50 mcg IV. ANESTHESIA/SEDATION: Moderate (conscious) sedation was employed during this procedure. A total of Versed 1.5 mg and Fentanyl 50 mcg was administered intravenously. Moderate Sedation Time: 15 minutes. The patient's level of consciousness and vital signs were monitored continuously by radiology nursing throughout the procedure under my direct supervision. FLUOROSCOPY TIME:  Fluoroscopy Time: 3 minutes 48 seconds (187 mGy). COMPLICATIONS: None immediate. PROCEDURE: Informed written consent was obtained from the patient after a thorough discussion of the procedural risks, benefits and alternatives. All questions were addressed. Maximal Sterile Barrier Technique was utilized including caps, mask, sterile gowns, sterile gloves, sterile drape, hand hygiene and skin antiseptic. A timeout was performed prior to the initiation of the procedure. Following a full explanation of the procedure along with the potential associated complications, an informed witnessed consent was obtained. The skin overlying the lumbar region was then prepped and draped in the usual sterile fashion. The right pedicle at L2 was then identified and infiltrated with 0.25% bupivacaine including the overlying skin. Using biplane intermittent fluoroscopy, an 11 gauge Jamshidi needle was then advanced to the right pedicle into the posterior 1/3 at L2. This was then exchanged out for a Kyphon advanced osteo introducer system comprised of a working cannula and a Kyphon osteo drill. This combination was then advanced over a Kyphon osteo bone pin until the working cannula  was in the posterior 1/3 at L2. At this time the osteo drill was removed. The core samples obtained were sent for pathologic analysis Two passes were then made with a Kyphon bone biopsy device in different directions. Using a 20 mL syringe, core biopsies were obtained and sent for microbiological and pathologic analysis Hemostasis was achieved at the skin entry site following removal of the working cannula. The patient tolerated the procedure well. There were no acute complications. IMPRESSION: Status post fluoroscopic  guided deep core biopsies at L2 as described above. Electronically Signed   By: Luanne Bras M.D.   On: 05/26/2016 17:04   Dg Chest Port 1 View  05/23/2016  CLINICAL DATA:  Low back pain, progressively worsening since fall April 2017. Cough. EXAM: PORTABLE CHEST 1 VIEW COMPARISON:  Chest CT 09/12/2013 FINDINGS: Right pacer in place with leads in the right atrium and right ventricle. Heart is normal size. No confluent airspace opacities. Scattered aortic calcifications. No acute bony abnormality. IMPRESSION: No active cardiopulmonary disease. Aortic atherosclerosis. Electronically Signed   By: Rolm Baptise M.D.   On: 05/23/2016 09:01    Eldred Sooy, MD, FACP, FHM. [facility] Pager [phone]  If 7PM-7AM, please contact night-coverage [email or web address] Password [email or web address] 05/29/2016, 5:10 PM   LOS: 7 days

## 2016-05-29 NOTE — Progress Notes (Addendum)
[facility] for Infectious Disease    Date of Admission:  05/22/2016   Total days of antibiotics 8        Day 4 ampicillin        Day 4 ceftriaxone           ID: [name] is a 82 y.o. male with  Enterococcal bacteremia and discitis Principal Problem:   Enterococcal bacteremia Active Problems:   Hypothyroidism   Hyperlipidemia   HYPERTENSION, BENIGN ESSENTIAL   ATRIAL FIBRILLATION   SICK SINUS SYNDROME   PERIPHERAL VASCULAR DISEASE   BPH (benign prostatic hyperplasia)   PACEMAKER, PERMANENT   Right hip pain   Elevated C-reactive protein (CRP)   Altered mental status   Compression fracture of L2 lumbar vertebra with delayed healing   Acute encephalopathy    Subjective: Afebrile  24 hr event: underwent TEE which did not show any evidence of endocarditis but has myxematous valve  Medications:  . amiodarone  100 mg Oral Daily  . ampicillin (OMNIPEN) IV  2 g Intravenous Q6H  . cefTRIAXone (ROCEPHIN)  IV  2 g Intravenous Q12H  . levothyroxine  25 mcg Oral QAC breakfast  . simvastatin  20 mg Oral QHS  . sodium chloride  250 mL Intravenous Once  . warfarin  5 mg Oral ONCE-1800  . Warfarin - Pharmacist Dosing Inpatient   Does not apply q1800    Objective: Vital signs in last 24 hours: Temp:  [97.8 F (36.6 C)-99.8 F (37.7 C)] 98.4 F (36.9 C) (06/29 1415) Pulse Rate:  [58-84] 58 (06/29 1550) Resp:  [12-23] 19 (06/29 1550) BP: (88-157)/(34-90) 107/55 mmHg (06/29 1550) SpO2:  [94 %-100 %] 95 % (06/29 1550) Physical Exam  Constitutional: He is oriented to person, place, and time. He appears well-developed and well-nourished. No distress.  HENT: edentulous Mouth/Throat: Oropharynx is clear and moist. No oropharyngeal exudate.  Cardiovascular: Normal rate, regular rhythm and normal heart sounds. Exam reveals no gallop and no friction rub.  No murmur heard.  Pulmonary/Chest: Effort normal and breath sounds normal. No respiratory distress. He has no wheezes.    Abdominal: Soft. Bowel sounds are normal. He exhibits no distension. There is no tenderness.  Skin: Skin is warm and dry. No rash noted. No erythema.  Psychiatric: He has a normal mood and affect. His behavior is normal.     Lab Results  Recent Labs  05/29/16 0545  WBC 12.8*  HGB 9.0*  HCT 27.6*   Lab Results  Component Value Date   ESRSEDRATE >140* 05/23/2016   Lab Results  Component Value Date   CRP 16.1* 05/23/2016    Microbiology: 6/26 tissue cx reincubating 6/24 blood cx ngtd 6/22 blood cx enterococcus (amp) Studies/Results: No results found.   Assessment/Plan: Enterococcal bacteremia complicated with discitis = continue on ampicillin IV. Can give it as a continuous infusion to minimize dosing as outpatient. Plan to treat for 6-8 wk iv therapy via picc line  Discitis = due to enterococcus infection.  Deconditioning = have pt/ot evaluation  Back pain = agree with getting TLSO brace to help with pain/support  Will sign off and will arrange for follow up in the ID clinic in 4-6wk  ---------------home health order/SNF order------------------  Diagnosis:discitis and bacteremia  Culture Result: enterococcus  Allergies  Allergen Reactions  . Guaifenesin Er Palpitations    Pt can not take mucinex PM  . Prednisone Other (See Comments)    HIGH doses make patient feel "crazy"  Discharge antibiotics: Per pharmacy protocol  Ampicillin continuous infusion  Duration: 6 wk End Date: Aug 8th  [facility] Per Protocol:  Labs weekly while on IV antibiotics: _x_ CBC with differential _x_ CMP x__ CRP x__ ESR   Fax weekly labs to [phone]  Clinic Follow Up Appt: In 6 wk with dr Mertie Haslem  @    [facility] Cell: [phone] Pager: [phone]  05/29/2016, 4:35 PM

## 2016-05-30 DIAGNOSIS — N179 Acute kidney failure, unspecified: Secondary | ICD-10-CM

## 2016-05-30 DIAGNOSIS — I4891 Unspecified atrial fibrillation: Secondary | ICD-10-CM

## 2016-05-30 DIAGNOSIS — M4646 Discitis, unspecified, lumbar region: Secondary | ICD-10-CM | POA: Insufficient documentation

## 2016-05-30 DIAGNOSIS — R509 Fever, unspecified: Secondary | ICD-10-CM | POA: Insufficient documentation

## 2016-05-30 LAB — CBC
HCT: 28 % — ABNORMAL LOW (ref 39.0–52.0)
Hemoglobin: 8.9 g/dL — ABNORMAL LOW (ref 13.0–17.0)
MCH: 30.3 pg (ref 26.0–34.0)
MCHC: 31.8 g/dL (ref 30.0–36.0)
MCV: 95.2 fL (ref 78.0–100.0)
PLATELETS: 346 10*3/uL (ref 150–400)
RBC: 2.94 MIL/uL — ABNORMAL LOW (ref 4.22–5.81)
RDW: 14.5 % (ref 11.5–15.5)
WBC: 9.2 10*3/uL (ref 4.0–10.5)

## 2016-05-30 LAB — BASIC METABOLIC PANEL
Anion gap: 10 (ref 5–15)
BUN: 13 mg/dL (ref 6–20)
CALCIUM: 8.4 mg/dL — AB (ref 8.9–10.3)
CO2: 28 mmol/L (ref 22–32)
CREATININE: 1.28 mg/dL — AB (ref 0.61–1.24)
Chloride: 100 mmol/L — ABNORMAL LOW (ref 101–111)
GFR calc non Af Amer: 48 mL/min — ABNORMAL LOW (ref >60)
GFR, EST AFRICAN AMERICAN: 55 mL/min — AB (ref >60)
GLUCOSE: 133 mg/dL — AB (ref 65–99)
Potassium: 3.7 mmol/L (ref 3.5–5.1)
Sodium: 138 mmol/L (ref 135–145)

## 2016-05-30 LAB — PROTIME-INR
INR: 1.76 — ABNORMAL HIGH (ref 0.00–1.49)
PROTHROMBIN TIME: 20.5 s — AB (ref 11.6–15.2)

## 2016-05-30 LAB — HEPARIN LEVEL (UNFRACTIONATED): Heparin Unfractionated: 0.3 IU/mL (ref 0.30–0.70)

## 2016-05-30 MED ORDER — SODIUM CHLORIDE 0.9% FLUSH
10.0000 mL | Freq: Two times a day (BID) | INTRAVENOUS | Status: DC
  Administered 2016-05-30 – 2016-06-01 (×3): 10 mL

## 2016-05-30 MED ORDER — WARFARIN SODIUM 5 MG PO TABS
5.0000 mg | ORAL_TABLET | Freq: Once | ORAL | Status: AC
  Administered 2016-05-30: 5 mg via ORAL
  Filled 2016-05-30: qty 1

## 2016-05-30 MED ORDER — SODIUM CHLORIDE 0.9% FLUSH: 10.0000 mL | INTRAVENOUS | Status: DC | PRN

## 2016-05-30 NOTE — Progress Notes (Signed)
Peripherally Inserted Central Catheter/Midline Placement  The IV Nurse has discussed with the patient and/or persons authorized to consent for the patient, the purpose of this procedure and the potential benefits and risks involved with this procedure.  The benefits include less needle sticks, lab draws from the catheter and patient may be discharged home with the catheter.  Risks include, but not limited to, infection, bleeding, blood clot (thrombus formation), and puncture of an artery; nerve damage and irregular heat beat.  Alternatives to this procedure were also discussed.  PICC/Midline Placement Documentation  PICC Single Lumen [id number] PICC Left Basilic 46 cm 0 cm (Active)  Indication for Insertion or Continuance of Line Home intravenous therapies (PICC only) 05/30/2016  8:21 AM  Exposed Catheter (cm) 0 cm 05/30/2016  8:21 AM  Site Assessment Clean;Dry;Intact 05/30/2016  8:21 AM  Line Status Flushed;Saline locked;Blood return noted 05/30/2016  8:21 AM  Dressing Type Transparent 05/30/2016  8:21 AM  Dressing Status Clean;Dry;Intact 05/30/2016  8:21 AM  Dressing Change Due 06/06/16 05/30/2016  8:21 AM       Gordan Payment 05/30/2016, 8:24 AM

## 2016-05-30 NOTE — Progress Notes (Signed)
Physical Therapy Treatment Patient Details Name: [name] MRN: [id number] DOB: [birth year] Today's Date: 05/30/2016    History of Present Illness Patient is a 82 y/o male with hx of SSS, pacemaker, HTN, A-fib, ataxia, TIA, falls presents with worsening back pain and confusion. Recently admitted admitted to [facility] from 04/16-04/18 s/p mechanical fall resulting in right hip pain and tx to SNF. CT of the lumbar spine-L1 compression fx and burst fracture of L2 with ESR 140. Biopsy performed 6/26, awaiting results. Prior to this found to have Enterococcus Bacteremia and diskitis.    PT Comments    Patient currently max/total A +2 for mobility and with increased with transitional movements. Current plan remains appropriate.   Follow Up Recommendations  SNF;Supervision for mobility/OOB     Equipment Recommendations  None recommended by PT    Recommendations for Other Services       Precautions / Restrictions Precautions Precautions: Fall;Back Precaution Comments: Reviewed back precautions. Required Braces or Orthoses: Spinal Brace Spinal Brace: Applied in supine position;Thoracolumbosacral orthotic Restrictions Weight Bearing Restrictions: No    Mobility  Bed Mobility Overal bed mobility: Needs Assistance Bed Mobility: Rolling;Sidelying to Sit Rolling: Max assist Sidelying to sit: +2 for physical assistance;Max assist       General bed mobility comments: cues for sequencing, technique, and maintaining back precuations with assist to bring R LE toward L side and roll trunk to L side with pt using bed rail to assist  Transfers Overall transfer level: Needs assistance   Transfers: Squat Pivot Transfers     Squat pivot transfers: Total assist;+2 physical assistance     General transfer comment: assist to squat pivot to recliner from EOB with bilat knees blocked for stability and pt holding onto therapist's arm; cues for technique; unable to achieve upright  posture  Ambulation/Gait                 Stairs            Wheelchair Mobility    Modified Rankin (Stroke Patients Only)       Balance Overall balance assessment: Needs assistance Sitting-balance support: Bilateral upper extremity supported;Feet supported Sitting balance-Leahy Scale: Poor Sitting balance - Comments: L lateral lean     Standing balance-Leahy Scale: Zero                      Cognition Arousal/Alertness: Awake/alert Behavior During Therapy: WFL for tasks assessed/performed Overall Cognitive Status: No family/caregiver present to determine baseline cognitive functioning                      Exercises      General Comments General comments (skin integrity, edema, etc.): PICC line bleeding end of session--RN aware      Pertinent Vitals/Pain Pain Assessment: Faces Faces Pain Scale: Hurts even more Pain Location: back with transitional movements Pain Descriptors / Indicators: Grimacing;Guarding;Moaning;Sore Pain Intervention(s): Limited activity within patient's tolerance;Monitored during session;Repositioned    Home Living                      Prior Function            PT Goals (current goals can now be found in the care plan section) Acute Rehab PT Goals Patient Stated Goal: none stated Progress towards PT goals: Progressing toward goals    Frequency  Min 2X/week    PT Plan Current plan remains appropriate    Co-evaluation  End of Session Equipment Utilized During Treatment: Gait belt;Back brace Activity Tolerance: Patient limited by pain Patient left: in chair;with call bell/phone within reach     Time: 6226-3335 PT Time Calculation (min) (ACUTE ONLY): 27 min  Charges:  $Therapeutic Activity: 23-37 mins                    G Codes:      Salina April, PTA Pager: [phone]   05/30/2016, 10:53 AM

## 2016-05-30 NOTE — Progress Notes (Signed)
Pharmacy note - Ampicillin continuous infusion  I was asked by Dr. Baxter Flattery to provide instructions on giving ampicillin after discharge.  I spoke with [facility] for their standard.  Place ampicillin 8g in Normal Saline 573mL.  Infuse this mixture over 24 hours.  Change the bag daily. The mixture is stable for 72 hours under refrigeration.  The facility will need to store the bags under refrigeration until infused for the patient.    Heide Guile, PharmD, BCPS-AQ ID Clinical Pharmacist

## 2016-05-30 NOTE — Progress Notes (Signed)
PROGRESS NOTE  [name] BJS:[id number] DOB: 04/01/1924 DOA: 05/22/2016 PCP: Merrilee Seashore, MD  Brief History:  82 year old male with a history of atrial fibrillation, TIA, hypertension, sick sinus syndrome status post PPM presented with worsening low back pain and confusion. The patient was admitted to [facility] from 03/16/2016 through 03/18/2016 after suffering a mechanical fall resulting in increasing right hip pain. Workup including CT of the hip at that time did not reveal a fracture. He was sent to physical nursing facility for rehabilitation. Unfortunately, pt continued to have significant back pain and declining mental status. As a result, the patient was brought to the emergency department. CT of the lumbar spine revealed compression fracture of L1 and burst fracture of L2 with ESR 140. The patient was seen by neurosurgery who felt that the patient was a poor surgical candidate. Interventional radiology was consulted for biopsy which was done on 05/26/16.  Now enterococcal bacteremia and discitis. Both blood cultures and disc site culture results confirmed enterococcus. Surveillance blood cultures negative. TEE negative for vegetations. Plan at discharge would be ampicillin IV continuous infusion through 07/08/16 followed by outpatient surveillance blood culture at the infectious disease clinic. PICC line placed-losing blood in the context of heparin bridging and Coumadin. DC to SNF when stable, possibly in the next 24 hours.  Assessment/Plan: Enterococcus Bacteremia complicated by discitis - Likely secondary to UTI. As per discussion with ID, patient has been treated for multiple UTIs in the past. Most recent urine culture showed Klebsiella. -05/22/16 blood culture--enterococcus sp. -05/07/36--TGG--YI 94-85%, no embolic source -4/62/70--[id number] blood cultures--neg -05/26/16--L2 biopsy--enterococcus with same sensitivity to ampicillin -TEE 6/29: No evidence of endocarditis.  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 Mild-to-moderate aortic stenosis. - ID follow-up appreciated: Continue IV ampicillin as continuous infusion and plan to treat for 6-8 weeks (end date 07/08/2016). Will need weekly CBC, CMP, CRP and ESR with results faxed to [phone]. ID will arrange clinic follow-up in 4-6 weeks.  L1-L2 compression fracture/Discitis-due to enterococcal infection -TLSO brace -Appreciate neurosurgery consultation -case discussed by Dr. Carles Collet with Dr. Shanon Brow Jones-->concerned about ESR 140 -MR not possible due to PPM - Pain is adequately controlled.   Acute encephalopathy -check serum B12--644 -TSH--4.22 -Ammonia--22 -continue amiodarone -Chest x-ray--neg -EKG--sinus, no concerning ST-T changes -UA--no pyuria - Resolved.   Atrial fibrillation -CHADS-VASc = 5 -restart intravenous heparin bridge 05/26/2016--dicussed with Dr. Estanislado Pandy -05/27/16--restarted warfarin. Pharmacy managing heparin bridging and Coumadin. -rate controlled. INR 1.6. Received vitamin K early on in admission. INR slowly increasing, 1.76 today. Was a through newly placed PICC line. For now continue heparin bridging and Coumadin per pharmacy but if bleeding through PICC site does not stop or worsens, DC heparin and continue Coumadin alone. Documented history of prior TIA.  Coaguloapthy--warfarin induced -received vitamin K 5 mg x 2 doses for biopsy  CKD stage 3 -Baseline creatinine 1.0-1.2 -Stable.  Hypothyroidism -Continue Synthroid -TSH 4.299 -recheck TSH in 4 weeks when pt more stable clinically  Hyperlipidemia -continue zocor  SSS -S/p PPM -monitor on tele - Discussed with primary cardiologist Dr. Rayann Heman, recommended continued conservative treatment and patient would be very high risk and poor candidate for pacemaker replacement surgery.   Goals of care -discussed with family -05/26/16--change pt to DNR  Constipation - Multifactorial. Adjust bowel  regimen.   Mild AKI - ? Poor PO. Encouraged oral fluids. Follow BMP in am.  Disposition Plan: SNF possibly 7/1. Family Communication: No family at bedside today. Consultants: Neurosurgery,  ID, IR, Cardiology (TEE)  Code Status: DNR  DVT Prophylaxis: Full AC with warfarin and heparin bridging   Procedures: As Listed in Progress Note Above  Antibiotics: Vancomycin 6/23-->6/26 Ampicillin 6/26>>> ceftriazone 6/26>>>    Subjective: Denied complaints. Denied pain. Stated that he had a BM yesterday.   Objective: Filed Vitals:   05/29/16 2153 05/30/16 0105 05/30/16 0555 05/30/16 0943  BP: 135/54 110/53 120/60 157/73  Pulse: 60 62 62 66  Temp: 97.9 F (36.6 C) 98 F (36.7 C) 97.9 F (36.6 C) 98.7 F (37.1 C)  TempSrc: Oral Oral Oral Oral  Resp: _0 Height:      Weight:      SpO2: 96% 96% 95% 98%    Intake/Output Summary (Last 24 hours) at 05/30/16 1316 Last data filed at 05/30/16 1226  Gross per 24 hour  Intake    240 ml  Output    700 ml  Net   -460 ml    Weight change:  Exam:   General:  Pt is alert, follows commands appropriately, not in acute distress.   HEENT: No icterus, No thrush, No neck mass, [place]/AT  Cardiovascular: RRR, S1/S2, no rubs, no gallops. Not on telemetry.  Respiratory: Slightly diminished breath sounds in the bases but otherwise clear to auscultation. No increased work of breathing.  Abdomen: Soft/+BS, non tender, non distended, no guarding  Extremities: No edema, No lymphangitis, No petechiae, No rashes, no synovitis  CNS: Alert and oriented. No focal deficits.  MSS: Has TLSO brace.   Data Reviewed: I have personally reviewed following labs and imaging studies Basic Metabolic Panel:  Recent Labs Lab 05/25/16 0627 05/26/16 0546 05/30/16 1010  NA 137 134* 138  K 3.8 4.0 3.7  CL 103 100* 100*  CO2 _1 GLUCOSE 138* 114* 133*  BUN _2 CREATININE 1.06 1.06 1.28*  CALCIUM 8.3* 8.0* 8.4*   Liver  Function Tests: No results for input(s): AST, ALT, ALKPHOS, BILITOT, PROT, ALBUMIN in the last 168 hours. No results for input(s): LIPASE, AMYLASE in the last 168 hours. No results for input(s): AMMONIA in the last 168 hours. Coagulation Profile:  Recent Labs Lab 05/26/16 0546 05/27/16 0327 05/28/16 0955 05/29/16 0545 05/30/16 1010  INR 1.35 1.35 1.82* 1.60* 1.76*   CBC:  Recent Labs Lab 05/25/16 0627 05/29/16 0545  WBC 7.8 12.8*  NEUTROABS  --  8.8*  HGB 8.9* 9.0*  HCT 28.3* 27.6*  MCV 93.1 94.2  PLT 284 317   Cardiac Enzymes: No results for input(s): CKTOTAL, CKMB, CKMBINDEX, TROPONINI in the last 168 hours. BNP: Invalid input(s): POCBNP CBG:  Recent Labs Lab 05/24/16 1557  GLUCAP 136*   HbA1C: No results for input(s): HGBA1C in the last 72 hours. Urine analysis:    Component Value Date/Time   COLORURINE YELLOW 05/22/2016 [place] 05/22/2016 1754   LABSPEC 1.016 05/22/2016 1754   PHURINE 7.0 05/22/2016 1754   GLUCOSEU NEGATIVE 05/22/2016 1754   GLUCOSEU NEGATIVE 09/28/2008 1243   HGBUR NEGATIVE 05/22/2016 1754   BILIRUBINUR NEGATIVE 05/22/2016 1754   KETONESUR NEGATIVE 05/22/2016 1754   PROTEINUR NEGATIVE 05/22/2016 1754   UROBILINOGEN 0.2 mg/dL 09/28/2008 1243   NITRITE NEGATIVE 05/22/2016 1754   LEUKOCYTESUR NEGATIVE 05/22/2016 1754   Sepsis Labs: _3 (procalcitonin:4,lacticidven:4) ) Recent Results (from the past 240 hour(s))  Blood culture (routine x 2)     Status: Abnormal   Collection Time: 05/22/16 10:12 PM  Result Value Ref Range Status  Specimen Description BLOOD RIGHT HAND  Final   Special Requests IN PEDIATRIC BOTTLE 2ML  Final   Culture  Setup Time   Final    GRAM POSITIVE COCCI IN PAIRS IN CHAINS CRITICAL RESULT CALLED TO, READ BACK BY AND VERIFIED WITH: TValinda Party.D. 17:20 05/23/16 ([email or web address]) IN PEDIATRIC BOTTLE    Culture (A)  Final    ENTEROCOCCUS SPECIES SUSCEPTIBILITIES PERFORMED ON PREVIOUS CULTURE  WITHIN THE LAST 5 DAYS.    Report Status 05/25/2016 FINAL  Final  Blood culture (routine x 2)     Status: Abnormal   Collection Time: 05/22/16 10:18 PM  Result Value Ref Range Status   Specimen Description BLOOD LEFT FOREARM  Final   Special Requests BOTTLES DRAWN AEROBIC AND ANAEROBIC 5ML  Final   Culture  Setup Time   Final    GRAM POSITIVE COCCOBACILLUS IN BOTH AEROBIC AND ANAEROBIC BOTTLES CRITICAL RESULT CALLED TO, READ BACK BY AND VERIFIED WITH: T EGAN,PHARMD AT 1720 05/23/16 BY M Esteban    Culture ENTEROCOCCUS SPECIES (A)  Final   Report Status 05/25/2016 FINAL  Final   Organism ID, Bacteria ENTEROCOCCUS SPECIES  Final      Susceptibility   Enterococcus species - MIC*    AMPICILLIN <=2 SENSITIVE Sensitive     VANCOMYCIN 1 SENSITIVE Sensitive     GENTAMICIN SYNERGY SENSITIVE Sensitive     * ENTEROCOCCUS SPECIES  Blood Culture ID Panel (Reflexed)     Status: Abnormal   Collection Time: 05/22/16 10:18 PM  Result Value Ref Range Status   Enterococcus species DETECTED (A) NOT DETECTED Final    Comment: CRITICAL RESULT CALLED TO, READ BACK BY AND VERIFIED WITH: TValinda Party.D. 17:20 05/23/16 ([email or web address])    Vancomycin resistance NOT DETECTED NOT DETECTED Final   Listeria monocytogenes NOT DETECTED NOT DETECTED Final   Staphylococcus species NOT DETECTED NOT DETECTED Final   Staphylococcus aureus NOT DETECTED NOT DETECTED Final   Methicillin resistance NOT DETECTED NOT DETECTED Final   Streptococcus species NOT DETECTED NOT DETECTED Final   Streptococcus agalactiae NOT DETECTED NOT DETECTED Final   Streptococcus pneumoniae NOT DETECTED NOT DETECTED Final   Streptococcus pyogenes NOT DETECTED NOT DETECTED Final   Acinetobacter baumannii NOT DETECTED NOT DETECTED Final   Enterobacteriaceae species NOT DETECTED NOT DETECTED Final   Enterobacter cloacae complex NOT DETECTED NOT DETECTED Final   Escherichia coli NOT DETECTED NOT DETECTED Final   Klebsiella oxytoca NOT DETECTED NOT  DETECTED Final   Klebsiella pneumoniae NOT DETECTED NOT DETECTED Final   Proteus species NOT DETECTED NOT DETECTED Final   Serratia marcescens NOT DETECTED NOT DETECTED Final   Carbapenem resistance NOT DETECTED NOT DETECTED Final   Haemophilus influenzae NOT DETECTED NOT DETECTED Final   Neisseria meningitidis NOT DETECTED NOT DETECTED Final   Pseudomonas aeruginosa NOT DETECTED NOT DETECTED Final   Candida albicans NOT DETECTED NOT DETECTED Final   Candida glabrata NOT DETECTED NOT DETECTED Final   Candida krusei NOT DETECTED NOT DETECTED Final   Candida parapsilosis NOT DETECTED NOT DETECTED Final   Candida tropicalis NOT DETECTED NOT DETECTED Final  MRSA PCR Screening     Status: None   Collection Time: 05/23/16  1:17 AM  Result Value Ref Range Status   MRSA by PCR NEGATIVE NEGATIVE Final    Comment:        The GeneXpert MRSA Assay (FDA approved for NASAL specimens only), is one component of a comprehensive MRSA colonization surveillance program. It is not intended to diagnose  MRSA infection nor to guide or monitor treatment for MRSA infections.   Culture, blood (Routine X 2) w Reflex to ID Panel     Status: None   Collection Time: 05/24/16  4:54 AM  Result Value Ref Range Status   Specimen Description BLOOD RIGHT ARM  Final   Special Requests AEROBIC BOTTLE ONLY 5ML  Final   Culture NO GROWTH 5 DAYS  Final   Report Status 05/29/2016 FINAL  Final  Culture, blood (Routine X 2) w Reflex to ID Panel     Status: None   Collection Time: 05/24/16  4:56 AM  Result Value Ref Range Status   Specimen Description BLOOD RIGHT ARM  Final   Special Requests AEROBIC BOTTLE ONLY 3ML  Final   Culture NO GROWTH 5 DAYS  Final   Report Status 05/29/2016 FINAL  Final  Aerobic/Anaerobic Culture (surgical/deep wound)     Status: None (Preliminary result)   Collection Time: 05/26/16  4:07 PM  Result Value Ref Range Status   Specimen Description TISSUE  Final   Special Requests LUMBAR L2 OF  BACK  Final   Gram Stain   Final    ABUNDANT WBC PRESENT,BOTH PMN AND MONONUCLEAR NO ORGANISMS SEEN    Culture   Final    FEW ENTEROCOCCUS SPECIES NO ANAEROBES ISOLATED; CULTURE IN PROGRESS FOR 5 DAYS    Report Status PENDING  Incomplete   Organism ID, Bacteria ENTEROCOCCUS SPECIES  Final      Susceptibility   Enterococcus species - MIC*    AMPICILLIN <=2 SENSITIVE Sensitive     VANCOMYCIN 1 SENSITIVE Sensitive     GENTAMICIN SYNERGY SENSITIVE Sensitive     * FEW ENTEROCOCCUS SPECIES     Scheduled Meds: . amiodarone  100 mg Oral Daily  . ampicillin (OMNIPEN) IV  2 g Intravenous Q6H  . levothyroxine  25 mcg Oral QAC breakfast  . polyethylene glycol  17 g Oral BID  . senna  2 tablet Oral Daily  . simvastatin  20 mg Oral QHS  . sodium chloride flush  10-40 mL Intracatheter Q12H  . warfarin  5 mg Oral ONCE-1800  . Warfarin - Pharmacist Dosing Inpatient   Does not apply q1800   Continuous Infusions: . heparin 1,100 Units/hr (05/30/16 0146)  . sodium chloride 0.9 % 1,000 mL infusion      Procedures/Studies: Ct Head Wo Contrast  05/22/2016  CLINICAL DATA:  Back pain, leg pain EXAM: CT HEAD WITHOUT CONTRAST TECHNIQUE: Contiguous axial images were obtained from the base of the skull through the vertex without intravenous contrast. COMPARISON:  09/21/2011 FINDINGS: Brain: No evidence of acute infarction, hemorrhage, extra-axial collection, ventriculomegaly, or mass effect. There is an area of encephalomalacia in the right parietal lobe. 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 Generalized cerebral atrophy. Periventricular white matter low attenuation likely secondary to microangiopathy. Vascular: Cerebrovascular atherosclerotic calcifications are noted. Skull: Negative for fracture or focal lesion. Sinuses/Orbits: Visualized portions of the orbits are unremarkable. Visualized portions of the paranasal sinuses and mastoid air cells are unremarkable. Other: None.  IMPRESSION: 1. No acute intracranial pathology. 2. Chronic microvascular disease and cerebral atrophy. Electronically Signed   By: Kathreen Devoid   On: 05/22/2016 21:17   Ct Lumbar Spine Wo Contrast  05/22/2016  CLINICAL DATA:  Pain post fall EXAM: CT LUMBAR SPINE WITHOUT CONTRAST TECHNIQUE: Multidetector CT imaging of the lumbar spine was performed without intravenous contrast administration. Multiplanar CT image reconstructions were also generated. COMPARISON:  CT 10/26/2007  FINDINGS: Five non rib bearing lobar is segments labeled L1-L5. T11-12: Bridging anterior endplate osteophytes. Osteopenia. Central canal and foramina patent. T12-L1: Bridging anterior osteophytes. Central canal and foramina patent. L1-2: Compression fracture deformity involving inferior endplate of L1 with a estimated 50% loss of height, more severe right than left. Additionally, there is burst fracture of L2 with greater than 50% loss of height anteriorly left greater than right. Fracture at L2 extends to involve the pedicles bilaterally, minimally displaced. Approximately 4 mm retropulsion at the level of the superior endplate of L2. There is a small amount of gas in the interspace extending up into the fractured component of L1. No unexpected soft tissue component. L2-3:  Mild disc bulge.  Mild bilateral facet DJD. L3-4:  Disc bulge.  Bilateral facet DJD.  Probable spinal stenosis. L4-5: Disc narrowing. Disc bulge. Grade 1 anterolisthesis probably secondary to the moderate facet DJD. Probable spinal stenosis. L5-S1:  Asymmetric disc narrowing.  Mild bulge.  Facet DJD. Fusion across bilateral sacroiliac joints. Patchy aortoiliac arterial calcifications without aneurysm. Cardiac pacing leads partially visualized. IMPRESSION: 1. Compression fracture of L1 and burst fracture of L2, as detailed above, probably posttraumatic. No other findings to suggest discitis or metastatic involvement. Findings were reviewed with Dr. Nevada Crane, who concurs. 2.  Spondylitic changes L2 to S1, with probable spinal stenosis L3-4 and L4-5. Electronically Signed   By: Lucrezia Europe M.D.   On: 05/22/2016 18:05   Ct Hip Right Wo Contrast  05/22/2016  CLINICAL DATA:  Fall today. Severe lower back pain and right hip pain EXAM: CT OF THE RIGHT HIP WITHOUT CONTRAST TECHNIQUE: Multidetector CT imaging of the right hip was performed according to the standard protocol. Multiplanar CT image reconstructions were also generated. COMPARISON:  03/16/2016 FINDINGS: Negative for fracture no significant effusion. Minor spurring from the femoral head and superior acetabulum. Right superior and inferior ischiopubic rami intact. Fusion across the right SI joint. Patchy iliac arterial calcifications without aneurysm. Urinary bladder is distended. IMPRESSION: 1. Negative.  No hip fracture. Electronically Signed   By: Lucrezia Europe M.D.   On: 05/22/2016 18:08   Ir Fluoro Guide Ndl Plmt / Bx  05/28/2016  INDICATION: Severe low back pain due to L2 fracture.  Osteomyelitis. EXAM: IR FLUORO GUIDE NEEDLE PLACEMENT /BIOPSY AT L2: MEDICATIONS: Versed 1.5 mg IV.  Fentanyl 50 mcg IV. ANESTHESIA/SEDATION: Moderate (conscious) sedation was employed during this procedure. A total of Versed 1.5 mg and Fentanyl 50 mcg was administered intravenously. Moderate Sedation Time: 15 minutes. The patient's level of consciousness and vital signs were monitored continuously by radiology nursing throughout the procedure under my direct supervision. FLUOROSCOPY TIME:  Fluoroscopy Time: 3 minutes 48 seconds (187 mGy). COMPLICATIONS: None immediate. PROCEDURE: Informed written consent was obtained from the patient after a thorough discussion of the procedural risks, benefits and alternatives. All questions were addressed. Maximal Sterile Barrier Technique was utilized including caps, mask, sterile gowns, sterile gloves, sterile drape, hand hygiene and skin antiseptic. A timeout was performed prior to the initiation of the  procedure. Following a full explanation of the procedure along with the potential associated complications, an informed witnessed consent was obtained. The skin overlying the lumbar region was then prepped and draped in the usual sterile fashion. The right pedicle at L2 was then identified and infiltrated with 0.25% bupivacaine including the overlying skin. Using biplane intermittent fluoroscopy, an 11 gauge Jamshidi needle was then advanced to the right pedicle into the posterior 1/3 at L2. This was then exchanged out  for a Kyphon advanced osteo introducer system comprised of a working cannula and a Engineer, manufacturing systems. This combination was then advanced over a Kyphon osteo bone pin until the working cannula was in the posterior 1/3 at L2. At this time the osteo drill was removed. The core samples obtained were sent for pathologic analysis Two passes were then made with a Kyphon bone biopsy device in different directions. Using a 20 mL syringe, core biopsies were obtained and sent for microbiological and pathologic analysis Hemostasis was achieved at the skin entry site following removal of the working cannula. The patient tolerated the procedure well. There were no acute complications. IMPRESSION: Status post fluoroscopic guided deep core biopsies at L2 as described above. Electronically Signed   By: Luanne Bras M.D.   On: 05/26/2016 17:04   Dg Chest Port 1 View  05/23/2016  CLINICAL DATA:  Low back pain, progressively worsening since fall April 2017. Cough. EXAM: PORTABLE CHEST 1 VIEW COMPARISON:  Chest CT 09/12/2013 FINDINGS: Right pacer in place with leads in the right atrium and right ventricle. Heart is normal size. No confluent airspace opacities. Scattered aortic calcifications. No acute bony abnormality. IMPRESSION: No active cardiopulmonary disease. Aortic atherosclerosis. Electronically Signed   By: Rolm Baptise M.D.   On: 05/23/2016 09:01    Maui Britten, MD, FACP, FHM. [facility] Pager [phone]  If 7PM-7AM, please contact night-coverage [email or web address] Password [email or web address] 05/30/2016, 1:16 PM   LOS: 8 days

## 2016-05-30 NOTE — Progress Notes (Signed)
ANTICOAGULATION CONSULT NOTE - Follow Up Consult  Pharmacy Consult for heparin>>warfarin Indication: atrial fibrillation  Allergies  Allergen Reactions  . Guaifenesin Er Palpitations    Pt can not take mucinex PM  . Prednisone Other (See Comments)    HIGH doses make patient feel "crazy"    Patient Measurements: Height: 5\' 8"  (172.7 cm) Weight: 150 lb 9.2 oz (68.3 kg) IBW/kg (Calculated) : 68.4 Heparin Dosing Weight: 68.3 kg  Vital Signs: Temp: 98.7 F (37.1 C) (06/30 0943) Temp Source: Oral (06/30 0943) BP: 157/73 mmHg (06/30 0943) Pulse Rate: 66 (06/30 0943)  Labs:  Recent Labs  05/28/16 0537 05/28/16 0955 05/29/16 0545 05/30/16 1010  HGB  --   --  9.0*  --   HCT  --   --  27.6*  --   PLT  --   --  317  --   LABPROT  --  21.0* 19.1* 20.5*  INR  --  1.82* 1.60* 1.76*  HEPARINUNFRC 0.50  --  0.47 0.30  CREATININE  --   --   --  1.28*    Estimated Creatinine Clearance: 37.8 mL/min (by C-G formula based on Cr of 1.28).  Assessment: 44 yoM with hx AFib on chronic anticoagulation with warfarin. Pharmacy consulted to dose heparin/warfarin. PTA warfarin dose 5 mg MWF, 2.5 mg all other days (last dose PTA 6/22)  Heparin level this morning is therapeutic at 0.3, will continue current rate and f/u daily heparin level. Pt s/p vitamin K PO on 6/24 and 6/25. INR is up but remains subtherapeutic at 1.76 today.  CBC stable, will order repeat for tomorrow. Per RN pt had bleeding when PICC line put in this morning.   Goal of Therapy:  INR 2-3 Heparin level 0.3-0.7 units/ml Monitor platelets by anticoagulation protocol: Yes   Plan:  Continue heparin infusion at 1100 units/hr Check anti-Xa level daily while on heparin Continue to monitor H&H, and platelets  Give warfarin 5 mg x 1 dose Monitor daily INR, CBC, clinical course, s/sx of bleed, PO intake, DDI   Thank you for allowing [place] to participate in this patients care. Jens Som, PharmD Pager:  [phone] 05/30/2016,11:44 AM

## 2016-05-30 NOTE — Care Management Important Message (Signed)
Important Message  Patient Details  Name: [name] MRN: [id number] Date of Birth: [birth year]   Medicare Important Message Given:  Yes    Kate Larock Abena 05/30/2016, 12:11 PM

## 2016-05-31 DIAGNOSIS — R4182 Altered mental status, unspecified: Secondary | ICD-10-CM

## 2016-05-31 LAB — ECHO TEE
AO mean calculated velocity dopler: 156 cm/s
AV Peak grad: 19 mmHg
AV pk vel: 217 cm/s
AVG: 11 mmHg
VTI: 44.9 cm

## 2016-05-31 LAB — AEROBIC/ANAEROBIC CULTURE W GRAM STAIN (SURGICAL/DEEP WOUND)

## 2016-05-31 LAB — BASIC METABOLIC PANEL
Anion gap: 8 (ref 5–15)
BUN: 11 mg/dL (ref 6–20)
CALCIUM: 8 mg/dL — AB (ref 8.9–10.3)
CO2: 27 mmol/L (ref 22–32)
CREATININE: 1.18 mg/dL (ref 0.61–1.24)
Chloride: 101 mmol/L (ref 101–111)
GFR calc Af Amer: >60 mL/min (ref >60)
GFR calc non Af Amer: 53 mL/min — ABNORMAL LOW (ref >60)
GLUCOSE: 105 mg/dL — AB (ref 65–99)
Potassium: 3.3 mmol/L — ABNORMAL LOW (ref 3.5–5.1)
Sodium: 136 mmol/L (ref 135–145)

## 2016-05-31 LAB — CBC
HEMATOCRIT: 26.6 % — AB (ref 39.0–52.0)
Hemoglobin: 8.5 g/dL — ABNORMAL LOW (ref 13.0–17.0)
MCH: 30.7 pg (ref 26.0–34.0)
MCHC: 32 g/dL (ref 30.0–36.0)
MCV: 96 fL (ref 78.0–100.0)
Platelets: 340 10*3/uL (ref 150–400)
RBC: 2.77 MIL/uL — ABNORMAL LOW (ref 4.22–5.81)
RDW: 14.3 % (ref 11.5–15.5)
WBC: 8.6 10*3/uL (ref 4.0–10.5)

## 2016-05-31 LAB — PROTIME-INR
INR: 2.04 — ABNORMAL HIGH (ref 0.00–1.49)
Prothrombin Time: 22.9 seconds — ABNORMAL HIGH (ref 11.6–15.2)

## 2016-05-31 LAB — AEROBIC/ANAEROBIC CULTURE (SURGICAL/DEEP WOUND)

## 2016-05-31 LAB — HEPARIN LEVEL (UNFRACTIONATED): Heparin Unfractionated: 0.33 IU/mL (ref 0.30–0.70)

## 2016-05-31 MED ORDER — WARFARIN SODIUM 5 MG PO TABS: 5.0000 mg | ORAL_TABLET | Freq: Once | ORAL | Status: DC

## 2016-05-31 MED ORDER — OXYCODONE HCL 5 MG PO TABS: 5.0000 mg | ORAL_TABLET | Freq: Four times a day (QID) | ORAL | Status: DC | PRN

## 2016-05-31 MED ORDER — LORAZEPAM 0.5 MG PO TABS
0.5000 mg | ORAL_TABLET | Freq: Once | ORAL | Status: AC
Start: 2016-05-31 — End: 2016-05-31
  Administered 2016-05-31: 0.5 mg via ORAL
  Filled 2016-05-31: qty 1

## 2016-05-31 MED ORDER — POTASSIUM CHLORIDE CRYS ER 20 MEQ PO TBCR
40.0000 meq | EXTENDED_RELEASE_TABLET | Freq: Once | ORAL | Status: AC
  Administered 2016-05-31: 40 meq via ORAL
  Filled 2016-05-31: qty 2

## 2016-05-31 NOTE — Progress Notes (Addendum)
ANTICOAGULATION CONSULT NOTE - Follow Up Consult  Pharmacy Consult for heparin>>warfarin Indication: atrial fibrillation  Allergies  Allergen Reactions  . Guaifenesin Er Palpitations    Pt can not take mucinex PM  . Prednisone Other (See Comments)    HIGH doses make patient feel "crazy"    Patient Measurements: Height: 5\' 8"  (172.7 cm) Weight: 150 lb 9.2 oz (68.3 kg) IBW/kg (Calculated) : 68.4 Heparin Dosing Weight: 68.3 kg  Vital Signs: Temp: 98.2 F (36.8 C) (07/01 0943) Temp Source: Oral (07/01 0943) BP: 167/78 mmHg (07/01 0943) Pulse Rate: 68 (07/01 0943)  Labs:  Recent Labs  05/29/16 0545 05/30/16 1010 05/30/16 1304 05/31/16 0425  HGB 9.0*  --  8.9* 8.5*  HCT 27.6*  --  28.0* 26.6*  PLT 317  --  346 340  LABPROT 19.1* 20.5*  --  22.9*  INR 1.60* 1.76*  --  2.04*  HEPARINUNFRC 0.47 0.30  --  0.33  CREATININE  --  1.28*  --  1.18    Estimated Creatinine Clearance: 41 mL/min (by C-G formula based on Cr of 1.18).  Assessment: 15 yoM with hx AFib on chronic anticoagulation with warfarin. Pharmacy consulted to dose heparin/warfarin.  INR this AM is 2.04, so heparin was discontinued. Will give 5 mg of warfarin x 1 again tonight. CBC stable. No further bleeding issues with PICC documented.  Goal of Therapy:  INR 2-3 Monitor platelets by anticoagulation protocol: Yes   Plan:  - D/c heparin - Warfarin 5 mg PO x 1 again tonight - F/u INR in AM to determine further dosing - Daily INR, CBC - Monitor s/sx of bleeding  Cassie L. Nicole Kindred, PharmD Clinical Pharmacist Pager: [phone] 05/31/2016 12:03 PM

## 2016-05-31 NOTE — Progress Notes (Signed)
PROGRESS NOTE  [name] [id number] DOB: 1924-04-19 DOA: 05/22/2016 PCP: Merrilee Seashore, MD  Brief History:  82 year old male with a history of atrial fibrillation, TIA, hypertension, sick sinus syndrome status post PPM presented with worsening low back pain and confusion. The patient was admitted to [facility] from 03/16/2016 through 03/18/2016 after suffering a mechanical fall resulting in increasing right hip pain. Workup including CT of the hip at that time did not reveal a fracture. He was sent to physical nursing facility for rehabilitation. Unfortunately, pt continued to have significant back pain and declining mental status. As a result, the patient was brought to the emergency department. CT of the lumbar spine revealed compression fracture of L1 and burst fracture of L2 with ESR 140. The patient was seen by neurosurgery who felt that the patient was a poor surgical candidate. Interventional radiology was consulted for biopsy which was done on 05/26/16.  Now enterococcal bacteremia and discitis. Both blood cultures and disc site culture results confirmed enterococcus. Surveillance blood cultures negative. TEE negative for vegetations. Plan at discharge would be ampicillin IV continuous infusion through 07/08/16 followed by outpatient surveillance blood culture at the infectious disease clinic. PICC line placed-losing blood in the context of heparin bridging and Coumadin. DC to SNF when stable, possibly in the next 24 hours.  Assessment/Plan: Enterococcus Bacteremia complicated by discitis - Likely secondary to UTI. As per discussion with ID, patient has been treated for multiple UTIs in the past. Most recent urine culture showed Klebsiella. -05/22/16 blood culture--enterococcus sp. -07/15/47--JEH--UD 14-97%, no embolic source -0/26/37--CHYIFOYDXAJO blood cultures--neg -05/26/16--L2 biopsy--enterococcus with same sensitivity to ampicillin -TEE 6/29: No evidence of endocarditis.  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 Mild-to-moderate aortic stenosis. - ID follow-up appreciated: Continue IV ampicillin as continuous infusion and plan to treat for 6-8 weeks (end date 07/08/2016). Will need weekly CBC, CMP, CRP and ESR with results faxed to [phone]. ID will arrange clinic follow-up in 4-6 weeks. - PICC line placed on 6/29. This was oozing blood which stopped sometime on 6/30.  L1-L2 compression fracture/Discitis-due to enterococcal infection -TLSO brace -Appreciate neurosurgery consultation -case discussed by Dr. Carles Collet with Dr. Shanon Brow Jones-->concerned about ESR 140 -MR not possible due to PPM - Pain is adequately controlled.   Acute encephalopathy -check serum B12--644 -TSH--4.22 -Ammonia--22 -continue amiodarone -Chest x-ray--neg -EKG--sinus, no concerning ST-T changes -UA--no pyuria - As per family, mental status was well until this morning when more confused. Received Ativan overnight. Avoid unnecessary sedatives or pain medications. RN informed. Monitor.  Atrial fibrillation -CHADS-VASc = 5 - Received vitamin K early on in admission to reverse INR. Post procedure, started heparin bridge and warfarin. INR therapeutic >2 on 7/1. Discontinued IV heparin bridge. No further blood oozing from PICC line. - Documented history of prior TIA.  Coaguloapthy--warfarin induced -received vitamin K 5 mg x 2 doses for biopsy  CKD stage 3 -Baseline creatinine 1.0-1.2 -Stable.  Hypothyroidism -Continue Synthroid -TSH 4.299 -recheck TSH in 4 weeks when pt more stable clinically  Hyperlipidemia -continue zocor  SSS -S/p PPM -monitor on tele - Discussed with primary cardiologist Dr. Rayann Heman on 6/30, recommended continued conservative treatment and patient would be very high risk and poor candidate for pacemaker replacement surgery.   Goals of care -discussed with family -05/26/16--change pt to DNR  Constipation - Multifactorial. Adjust  bowel regimen. Had BM 6/30.  Mild AKI - ? Poor PO. Encouraged oral fluids. Creatinine has normalized.  Hypokalemia - Replace  and follow.  Disposition Plan: SNF possibly 7/2 pending improvement in mental status and stable hemoglobin. Family Communication: Discussed at length with patient's spouse and oldest daughter at bedside. Consultants: Neurosurgery, ID, IR, Cardiology (TEE)  Code Status: DNR  DVT Prophylaxis: Full AC with warfarin and heparin bridging   Procedures: As Listed in Progress Note Above  Antibiotics: Vancomycin 6/23-->6/26 Ampicillin 6/26>>> ceftriazone 6/26>>>    Subjective: Complaints of feeling sore all over. Even though his oriented, family states that he has been confused today. This was confirmed by nursing. Had BM yesterday.  Objective: Filed Vitals:   05/31/16 0127 05/31/16 0543 05/31/16 0943 05/31/16 1413  BP: 153/70 160/72 167/78 176/83  Pulse: 63 66 68 65  Temp: 98.4 F (36.9 C) 98.5 F (36.9 C) 98.2 F (36.8 C) 98.1 F (36.7 C)  TempSrc: Oral Oral Oral Oral  Resp: '18 20 18 18  '$ Height:      Weight:      SpO2: 94% 96% 96% 96%    Intake/Output Summary (Last 24 hours) at 05/31/16 1520 Last data filed at 05/31/16 0543  Gross per 24 hour  Intake      0 ml  Output    550 ml  Net   -550 ml    Weight change:  Exam:   General:  Pleasant elderly frail male patient lying comfortably supine in bed.  Cardiovascular: RRR, S1/S2, no rubs, no gallops. Not on telemetry.  Respiratory: Slightly diminished breath sounds in the bases but otherwise clear to auscultation. No increased work of breathing.  Abdomen: Soft/+BS, non tender, non distended, no guarding  Extremities: No edema, No lymphangitis, No petechiae, No rashes, no synovitis. Left upper arm PICC line with minimal dried blood but no active bleeding or oozing.  CNS: Alert and oriented 3. No focal deficits.  MSS: Has TLSO brace when upright.   Data Reviewed: I have  personally reviewed following labs and imaging studies Basic Metabolic Panel:  Recent Labs Lab 05/25/16 0627 05/26/16 0546 05/30/16 1010 05/31/16 0425  NA 137 134* 138 136  K 3.8 4.0 3.7 3.3*  CL 103 100* 100* 101  CO2 '27 28 28 27  '$ GLUCOSE 138* 114* 133* 105*  BUN '13 10 13 11  '$ CREATININE 1.06 1.06 1.28* 1.18  CALCIUM 8.3* 8.0* 8.4* 8.0*   Liver Function Tests: No results for input(s): AST, ALT, ALKPHOS, BILITOT, PROT, ALBUMIN in the last 168 hours. No results for input(s): LIPASE, AMYLASE in the last 168 hours. No results for input(s): AMMONIA in the last 168 hours. Coagulation Profile:  Recent Labs Lab 05/27/16 0327 05/28/16 0955 05/29/16 0545 05/30/16 1010 05/31/16 0425  INR 1.35 1.82* 1.60* 1.76* 2.04*   CBC:  Recent Labs Lab 05/25/16 0627 05/29/16 0545 05/30/16 1304 05/31/16 0425  WBC 7.8 12.8* 9.2 8.6  NEUTROABS  --  8.8*  --   --   HGB 8.9* 9.0* 8.9* 8.5*  HCT 28.3* 27.6* 28.0* 26.6*  MCV 93.1 94.2 95.2 96.0  PLT 284 317 346 340   Cardiac Enzymes: No results for input(s): CKTOTAL, CKMB, CKMBINDEX, TROPONINI in the last 168 hours. BNP: Invalid input(s): POCBNP CBG:  Recent Labs Lab 05/24/16 1557  GLUCAP 136*   HbA1C: No results for input(s): HGBA1C in the last 72 hours. Urine analysis:    Component Value Date/Time   COLORURINE YELLOW 05/22/2016 1754   APPEARANCEUR CLEAR 05/22/2016 1754   LABSPEC 1.016 05/22/2016 1754   PHURINE 7.0 05/22/2016 1754   GLUCOSEU NEGATIVE 05/22/2016 1754   GLUCOSEU  NEGATIVE 09/28/2008 1243   HGBUR NEGATIVE 05/22/2016 1754   BILIRUBINUR NEGATIVE 05/22/2016 1754   KETONESUR NEGATIVE 05/22/2016 1754   PROTEINUR NEGATIVE 05/22/2016 1754   UROBILINOGEN 0.2 mg/dL 09/28/2008 1243   NITRITE NEGATIVE 05/22/2016 1754   LEUKOCYTESUR NEGATIVE 05/22/2016 1754   Sepsis Labs: '@LABRCNTIP'$ (procalcitonin:4,lacticidven:4) ) Recent Results (from the past 240 hour(s))  Blood culture (routine x 2)     Status: Abnormal    Collection Time: 05/22/16 10:12 PM  Result Value Ref Range Status   Specimen Description BLOOD RIGHT HAND  Final   Special Requests IN PEDIATRIC BOTTLE 2ML  Final   Culture  Setup Time   Final    GRAM POSITIVE COCCI IN PAIRS IN CHAINS CRITICAL RESULT CALLED TO, READ BACK BY AND VERIFIED WITH: TValinda Party.D. 17:20 05/23/16 ([email or web address]) IN PEDIATRIC BOTTLE    Culture (A)  Final    ENTEROCOCCUS SPECIES SUSCEPTIBILITIES PERFORMED ON PREVIOUS CULTURE WITHIN THE LAST 5 DAYS.    Report Status 05/25/2016 FINAL  Final  Blood culture (routine x 2)     Status: Abnormal   Collection Time: 05/22/16 10:18 PM  Result Value Ref Range Status   Specimen Description BLOOD LEFT FOREARM  Final   Special Requests BOTTLES DRAWN AEROBIC AND ANAEROBIC 5ML  Final   Culture  Setup Time   Final    GRAM POSITIVE COCCOBACILLUS IN BOTH AEROBIC AND ANAEROBIC BOTTLES CRITICAL RESULT CALLED TO, READ BACK BY AND VERIFIED WITH: T EGAN,PHARMD AT 1720 05/23/16 BY M Mcclaren    Culture ENTEROCOCCUS SPECIES (A)  Final   Report Status 05/25/2016 FINAL  Final   Organism ID, Bacteria ENTEROCOCCUS SPECIES  Final      Susceptibility   Enterococcus species - MIC*    AMPICILLIN <=2 SENSITIVE Sensitive     VANCOMYCIN 1 SENSITIVE Sensitive     GENTAMICIN SYNERGY SENSITIVE Sensitive     * ENTEROCOCCUS SPECIES  Blood Culture ID Panel (Reflexed)     Status: Abnormal   Collection Time: 05/22/16 10:18 PM  Result Value Ref Range Status   Enterococcus species DETECTED (A) NOT DETECTED Final    Comment: CRITICAL RESULT CALLED TO, READ BACK BY AND VERIFIED WITH: TValinda Party.D. 17:20 05/23/16 ([email or web address])    Vancomycin resistance NOT DETECTED NOT DETECTED Final   Listeria monocytogenes NOT DETECTED NOT DETECTED Final   Staphylococcus species NOT DETECTED NOT DETECTED Final   Staphylococcus aureus NOT DETECTED NOT DETECTED Final   Methicillin resistance NOT DETECTED NOT DETECTED Final   Streptococcus species NOT DETECTED NOT DETECTED  Final   Streptococcus agalactiae NOT DETECTED NOT DETECTED Final   Streptococcus pneumoniae NOT DETECTED NOT DETECTED Final   Streptococcus pyogenes NOT DETECTED NOT DETECTED Final   Acinetobacter baumannii NOT DETECTED NOT DETECTED Final   Enterobacteriaceae species NOT DETECTED NOT DETECTED Final   Enterobacter cloacae complex NOT DETECTED NOT DETECTED Final   Escherichia coli NOT DETECTED NOT DETECTED Final   Klebsiella oxytoca NOT DETECTED NOT DETECTED Final   Klebsiella pneumoniae NOT DETECTED NOT DETECTED Final   Proteus species NOT DETECTED NOT DETECTED Final   Serratia marcescens NOT DETECTED NOT DETECTED Final   Carbapenem resistance NOT DETECTED NOT DETECTED Final   Haemophilus influenzae NOT DETECTED NOT DETECTED Final   Neisseria meningitidis NOT DETECTED NOT DETECTED Final   Pseudomonas aeruginosa NOT DETECTED NOT DETECTED Final   Candida albicans NOT DETECTED NOT DETECTED Final   Candida glabrata NOT DETECTED NOT DETECTED Final   Candida krusei NOT DETECTED NOT DETECTED Final  Candida parapsilosis NOT DETECTED NOT DETECTED Final   Candida tropicalis NOT DETECTED NOT DETECTED Final  MRSA PCR Screening     Status: None   Collection Time: 05/23/16  1:17 AM  Result Value Ref Range Status   MRSA by PCR NEGATIVE NEGATIVE Final    Comment:        The GeneXpert MRSA Assay (FDA approved for NASAL specimens only), is one component of a comprehensive MRSA colonization surveillance program. It is not intended to diagnose MRSA infection nor to guide or monitor treatment for MRSA infections.   Culture, blood (Routine X 2) w Reflex to ID Panel     Status: None   Collection Time: 05/24/16  4:54 AM  Result Value Ref Range Status   Specimen Description BLOOD RIGHT ARM  Final   Special Requests AEROBIC BOTTLE ONLY  Final   Culture NO GROWTH 5 DAYS  Final   Report Status 05/29/2016 FINAL  Final  Culture, blood (Routine X 2) w Reflex to ID Panel     Status: None   Collection  Time: 05/24/16  4:56 AM  Result Value Ref Range Status   Specimen Description BLOOD RIGHT ARM  Final   Special Requests AEROBIC BOTTLE ONLY  Final   Culture NO GROWTH 5 DAYS  Final   Report Status 05/29/2016 FINAL  Final  Aerobic/Anaerobic Culture (surgical/deep wound)     Status: None   Collection Time: 05/26/16  4:07 PM  Result Value Ref Range Status   Specimen Description TISSUE  Final   Special Requests LUMBAR L2 OF BACK  Final   Gram Stain   Final    ABUNDANT WBC PRESENT,BOTH PMN AND MONONUCLEAR NO ORGANISMS SEEN    Culture   Final    FEW ENTEROCOCCUS SPECIES NO ANAEROBES ISOLATED CRITICAL RESULT CALLED TO, READ BACK BY AND VERIFIED WITH: Illene Silver AT 1004 05/31/16 BY L BENFIELD    Report Status 05/31/2016 FINAL  Final   Organism ID, Bacteria ENTEROCOCCUS SPECIES  Final      Susceptibility   Enterococcus species - MIC*    AMPICILLIN <=2 SENSITIVE Sensitive     VANCOMYCIN 1 SENSITIVE Sensitive     GENTAMICIN SYNERGY SENSITIVE Sensitive     * FEW ENTEROCOCCUS SPECIES     Scheduled Meds: . amiodarone  100 mg Oral Daily  . ampicillin (OMNIPEN) IV  2 g Intravenous Q6H  . levothyroxine  25 mcg Oral QAC breakfast  . polyethylene glycol  17 g Oral BID  . senna  2 tablet Oral Daily  . simvastatin  20 mg Oral QHS  . sodium chloride flush  10-40 mL Intracatheter Q12H  . warfarin  5 mg Oral ONCE-1800  . Warfarin - Pharmacist Dosing Inpatient   Does not apply q1800   Continuous Infusions: . sodium chloride 0.9 % 1,000 mL infusion      Procedures/Studies: Ct Head Wo Contrast  05/22/2016  CLINICAL DATA:  Back pain, leg pain EXAM: CT HEAD WITHOUT CONTRAST TECHNIQUE: Contiguous axial images were obtained from the base of the skull through the vertex without intravenous contrast. COMPARISON:  09/21/2011 FINDINGS: Brain: No evidence of acute infarction, hemorrhage, extra-axial collection, ventriculomegaly, or mass effect. There is an area of encephalomalacia in the right  parietal lobe. 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 Generalized cerebral atrophy. Periventricular white matter low attenuation likely secondary to microangiopathy. Vascular: Cerebrovascular atherosclerotic calcifications are noted. Skull: Negative for fracture or focal lesion. Sinuses/Orbits: Visualized portions of the orbits  are unremarkable. Visualized portions of the paranasal sinuses and mastoid air cells are unremarkable. Other: None. IMPRESSION: 1. No acute intracranial pathology. 2. Chronic microvascular disease and cerebral atrophy. Electronically Signed   By: Kathreen Devoid   On: 05/22/2016 21:17   Ct Lumbar Spine Wo Contrast  05/22/2016  CLINICAL DATA:  Pain post fall EXAM: CT LUMBAR SPINE WITHOUT CONTRAST TECHNIQUE: Multidetector CT imaging of the lumbar spine was performed without intravenous contrast administration. Multiplanar CT image reconstructions were also generated. COMPARISON:  CT 10/26/2007 FINDINGS: Five non rib bearing lobar is segments labeled L1-L5. T11-12: Bridging anterior endplate osteophytes. Osteopenia. Central canal and foramina patent. T12-L1: Bridging anterior osteophytes. Central canal and foramina patent. L1-2: Compression fracture deformity involving inferior endplate of L1 with a estimated 50% loss of height, more severe right than left. Additionally, there is burst fracture of L2 with greater than 50% loss of height anteriorly left greater than right. Fracture at L2 extends to involve the pedicles bilaterally, minimally displaced. Approximately 4 mm retropulsion at the level of the superior endplate of L2. There is a small amount of gas in the interspace extending up into the fractured component of L1. No unexpected soft tissue component. L2-3:  Mild disc bulge.  Mild bilateral facet DJD. L3-4:  Disc bulge.  Bilateral facet DJD.  Probable spinal stenosis. L4-5: Disc narrowing. Disc bulge. Grade 1 anterolisthesis probably secondary to the moderate  facet DJD. Probable spinal stenosis. L5-S1:  Asymmetric disc narrowing.  Mild bulge.  Facet DJD. Fusion across bilateral sacroiliac joints. Patchy aortoiliac arterial calcifications without aneurysm. Cardiac pacing leads partially visualized. IMPRESSION: 1. Compression fracture of L1 and burst fracture of L2, as detailed above, probably posttraumatic. No other findings to suggest discitis or metastatic involvement. Findings were reviewed with Dr. Nevada Crane, who concurs. 2. Spondylitic changes L2 to S1, with probable spinal stenosis L3-4 and L4-5. Electronically Signed   By: Lucrezia Europe M.D.   On: 05/22/2016 18:05   Ct Hip Right Wo Contrast  05/22/2016  CLINICAL DATA:  Fall today. Severe lower back pain and right hip pain EXAM: CT OF THE RIGHT HIP WITHOUT CONTRAST TECHNIQUE: Multidetector CT imaging of the right hip was performed according to the standard protocol. Multiplanar CT image reconstructions were also generated. COMPARISON:  03/16/2016 FINDINGS: Negative for fracture no significant effusion. Minor spurring from the femoral head and superior acetabulum. Right superior and inferior ischiopubic rami intact. Fusion across the right SI joint. Patchy iliac arterial calcifications without aneurysm. Urinary bladder is distended. IMPRESSION: 1. Negative.  No hip fracture. Electronically Signed   By: Lucrezia Europe M.D.   On: 05/22/2016 18:08   Ir Fluoro Guide Ndl Plmt / Bx  05/28/2016  INDICATION: Severe low back pain due to L2 fracture.  Osteomyelitis. EXAM: IR FLUORO GUIDE NEEDLE PLACEMENT /BIOPSY AT L2: MEDICATIONS: Versed 1.5 mg IV.  Fentanyl 50 mcg IV. ANESTHESIA/SEDATION: Moderate (conscious) sedation was employed during this procedure. A total of Versed 1.5 mg and Fentanyl 50 mcg was administered intravenously. Moderate Sedation Time: 15 minutes. The patient's level of consciousness and vital signs were monitored continuously by radiology nursing throughout the procedure under my direct supervision. FLUOROSCOPY  TIME:  Fluoroscopy Time: 3 minutes 48 seconds (187 mGy). COMPLICATIONS: None immediate. PROCEDURE: Informed written consent was obtained from the patient after a thorough discussion of the procedural risks, benefits and alternatives. All questions were addressed. Maximal Sterile Barrier Technique was utilized including caps, mask, sterile gowns, sterile gloves, sterile drape, hand hygiene and skin antiseptic. A  timeout was performed prior to the initiation of the procedure. Following a full explanation of the procedure along with the potential associated complications, an informed witnessed consent was obtained. The skin overlying the lumbar region was then prepped and draped in the usual sterile fashion. The right pedicle at L2 was then identified and infiltrated with 0.25% bupivacaine including the overlying skin. Using biplane intermittent fluoroscopy, an 11 gauge Jamshidi needle was then advanced to the right pedicle into the posterior 1/3 at L2. This was then exchanged out for a Kyphon advanced osteo introducer system comprised of a working cannula and a Kyphon osteo drill. This combination was then advanced over a Kyphon osteo bone pin until the working cannula was in the posterior 1/3 at L2. At this time the osteo drill was removed. The core samples obtained were sent for pathologic analysis Two passes were then made with a Kyphon bone biopsy device in different directions. Using a 20 mL syringe, core biopsies were obtained and sent for microbiological and pathologic analysis Hemostasis was achieved at the skin entry site following removal of the working cannula. The patient tolerated the procedure well. There were no acute complications. IMPRESSION: Status post fluoroscopic guided deep core biopsies at L2 as described above. Electronically Signed   By: Luanne Bras M.D.   On: 05/26/2016 17:04   Dg Chest Port 1 View  05/23/2016  CLINICAL DATA:  Low back pain, progressively worsening since fall April  2017. Cough. EXAM: PORTABLE CHEST 1 VIEW COMPARISON:  Chest CT 09/12/2013 FINDINGS: Right pacer in place with leads in the right atrium and right ventricle. Heart is normal size. No confluent airspace opacities. Scattered aortic calcifications. No acute bony abnormality. IMPRESSION: No active cardiopulmonary disease. Aortic atherosclerosis. Electronically Signed   By: Rolm Baptise M.D.   On: 05/23/2016 09:01    Akaash Vandewater, MD, FACP, FHM. [facility] Pager [phone]  If 7PM-7AM, please contact night-coverage [email or web address] Password [email or web address] 05/31/2016, 3:20 PM   LOS: 9 days

## 2016-06-01 LAB — BASIC METABOLIC PANEL
ANION GAP: 7 (ref 5–15)
BUN: 9 mg/dL (ref 6–20)
CALCIUM: 8.4 mg/dL — AB (ref 8.9–10.3)
CO2: 27 mmol/L (ref 22–32)
CREATININE: 1.09 mg/dL (ref 0.61–1.24)
Chloride: 101 mmol/L (ref 101–111)
GFR, EST NON AFRICAN AMERICAN: 58 mL/min — AB (ref >60)
Glucose, Bld: 107 mg/dL — ABNORMAL HIGH (ref 65–99)
Potassium: 3.9 mmol/L (ref 3.5–5.1)
SODIUM: 135 mmol/L (ref 135–145)

## 2016-06-01 LAB — CBC
HEMATOCRIT: 28 % — AB (ref 39.0–52.0)
Hemoglobin: 8.8 g/dL — ABNORMAL LOW (ref 13.0–17.0)
MCH: 29.6 pg (ref 26.0–34.0)
MCHC: 31.4 g/dL (ref 30.0–36.0)
MCV: 94.3 fL (ref 78.0–100.0)
PLATELETS: 359 10*3/uL (ref 150–400)
RBC: 2.97 MIL/uL — ABNORMAL LOW (ref 4.22–5.81)
RDW: 14.4 % (ref 11.5–15.5)
WBC: 6.5 10*3/uL (ref 4.0–10.5)

## 2016-06-01 LAB — PROTIME-INR
INR: 1.93 — AB (ref 0.00–1.49)
PROTHROMBIN TIME: 22 s — AB (ref 11.6–15.2)

## 2016-06-01 MED ORDER — WARFARIN SODIUM 7.5 MG PO TABS
7.5000 mg | ORAL_TABLET | Freq: Once | ORAL | Status: AC
  Administered 2016-06-01: 7.5 mg via ORAL
  Filled 2016-06-01: qty 1

## 2016-06-01 NOTE — Progress Notes (Addendum)
ANTICOAGULATION CONSULT NOTE - Follow Up Consult  Pharmacy Consult for warfarin Indication: atrial fibrillation  Allergies  Allergen Reactions  . Guaifenesin Er Palpitations    Pt can not take mucinex PM  . Prednisone Other (See Comments)    HIGH doses make patient feel "crazy"    Patient Measurements: Height: 5\' 8"  (172.7 cm) Weight: 150 lb 9.2 oz (68.3 kg) IBW/kg (Calculated) : 68.4 Heparin Dosing Weight: 68.3 kg  Vital Signs: Temp: 98 F (36.7 C) (07/02 1020) Temp Source: Oral (07/02 1020) BP: 148/88 mmHg (07/02 1020) Pulse Rate: 66 (07/02 1020)  Labs:  Recent Labs  05/30/16 1010  05/30/16 1304 05/31/16 0425 06/01/16 0441  HGB  --   < > 8.9* 8.5* 8.8*  HCT  --   --  28.0* 26.6* 28.0*  PLT  --   --  346 340 359  LABPROT 20.5*  --   --  22.9* 22.0*  INR 1.76*  --   --  2.04* 1.93*  HEPARINUNFRC 0.30  --   --  0.33  --   CREATININE 1.28*  --   --  1.18 1.09  < > = values in this interval not displayed.  Estimated Creatinine Clearance: 44.4 mL/min (by C-G formula based on Cr of 1.09).  Assessment: 82 yo M with hx AFib on chronic anticoagulation with warfarin. Pharmacy consulted to dose warfarin.  INR was in range yesterday morning at 2.04 so heparin was discontinued.  Warfarin 5 mg was ordered for 7/1 PM - but pt choked on dinner and refused evening medication. Will have to give higher dose tonight. CBC stable.  Goal of Therapy:  INR 2-3 Monitor platelets by anticoagulation protocol: Yes   Plan:  - Warfarin 7.5 mg PO x 1 tonight - F/u INR in AM to determine further dosing - Daily INR, CBC - Monitor s/sx of bleeding  Kamika Goodloe L. Nicole Kindred, PharmD Clinical Pharmacist Pager: [phone] 06/01/2016 11:56 AM

## 2016-06-01 NOTE — Progress Notes (Signed)
PROGRESS NOTE  [name] YOM:[id number] DOB: October 17, 1924 DOA: 05/22/2016 PCP: Merrilee Seashore, MD  Brief History:  82 year old male with a history of atrial fibrillation, TIA, hypertension, sick sinus syndrome status post PPM presented with worsening low back pain and confusion. The patient was admitted to [facility] from 03/16/2016 through 03/18/2016 after suffering a mechanical fall resulting in increasing right hip pain. Workup including CT of the hip at that time did not reveal a fracture. He was sent to physical nursing facility for rehabilitation. Unfortunately, pt continued to have significant back pain and declining mental status. As a result, the patient was brought to the emergency department. CT of the lumbar spine revealed compression fracture of L1 and burst fracture of L2 with ESR 140. The patient was seen by neurosurgery who felt that the patient was a poor surgical candidate. Interventional radiology was consulted for biopsy which was done on 05/26/16.  Now enterococcal bacteremia and discitis. Both blood cultures and disc site culture results confirmed enterococcus. Surveillance blood cultures negative. TEE negative for vegetations. Plan at discharge would be ampicillin IV continuous infusion through 07/08/16 followed by outpatient surveillance blood culture at the infectious disease clinic. PICC line placed-losing blood in the context of heparin bridging and Coumadin. DC to SNF-medically ready but SNF unable to accept him until 7/3.Marland Kitchen  Assessment/Plan: Enterococcus Bacteremia complicated by discitis - Likely secondary to UTI. As per discussion with ID, patient has been treated for multiple UTIs in the past. Most recent urine culture showed Klebsiella. -05/22/16 blood culture--enterococcus sp. -03/14/22--TRV--UY 23-34%, no embolic source -3/56/86--[id number] blood cultures--neg -05/26/16--L2 biopsy--enterococcus with same sensitivity to ampicillin -TEE 6/29: No evidence of  endocarditis. 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 Mild-to-moderate aortic stenosis. - ID follow-up appreciated: Continue IV ampicillin as continuous infusion and plan to treat for 6-8 weeks (end date 07/08/2016). Will need weekly CBC, CMP, CRP and ESR with results faxed to [phone]. ID will arrange clinic follow-up in 4-6 weeks. - PICC line placed on 6/29. This was oozing blood which stopped sometime on 6/30.  L1-L2 compression fracture/Discitis-due to enterococcal infection -TLSO brace -Appreciate neurosurgery consultation -case discussed by Dr. Carles Collet with Dr. Shanon Brow Jones-->concerned about ESR 140 -MR not possible due to PPM - Pain is adequately controlled.   Acute encephalopathy -check serum B12--644 -TSH--4.22 -Ammonia--22 -continue amiodarone -Chest x-ray--neg -EKG--sinus, no concerning ST-T changes -UA--no pyuria - Patient was confused on 7/1 likely secondary to Ativan that he received the night prior. This has since resolved. Mental status back to baseline as per family at bedside.  Atrial fibrillation -CHADS-VASc = 5 - Received vitamin K early on in admission to reverse INR. Post procedure, started heparin bridge and warfarin. INR therapeutic >2 on 7/1. Discontinued IV heparin bridge. No further blood oozing from PICC line. - Documented history of prior TIA. - INR: 1.93. Pharmacy providing slightly increased dose of Coumadin tonight. Follow INR.  Coaguloapthy--warfarin induced -received vitamin K 5 mg x 2 doses for biopsy  Anemia - Stable.  CKD stage 3 -Baseline creatinine 1.0-1.2 -Stable.  Hypothyroidism -Continue Synthroid -TSH 4.299 -recheck TSH in 4 weeks when pt more stable clinically  Hyperlipidemia -continue zocor  SSS -S/p PPM -monitor on tele - Discussed with primary cardiologist Dr. Rayann Heman on 6/30, recommended continued conservative treatment and patient would be very high risk and poor candidate for pacemaker replacement  surgery.   Goals of care -discussed with family -05/26/16--change pt to DNR  Constipation - Multifactorial. Adjust  bowel regimen. Had BM 6/30.  Mild AKI - ? Poor PO. Encouraged oral fluids. Creatinine has normalized.  Hypokalemia - Replaced  Disposition Plan: Patient medically ready for discharge today. Discussed with social worker who indicates that SNF is not able to accept patient until 7/3. Family Communication: Discussed at length with patient's spouse, son and grandson at bedside. Consultants: Neurosurgery, ID, IR, Cardiology (TEE)  Code Status: DNR  DVT Prophylaxis: Full AC with warfarin and heparin bridging   Procedures: As Listed in Progress Note Above  Antibiotics: Vancomycin 6/23-->6/26 Ampicillin 6/26>>> ceftriazone 6/26>>>    Subjective: "Bored". Denies any other complaints. As per spouse and family at bedside, mental status back to baseline and confusion has resolved. As per RN, no acute issues.  Objective: Filed Vitals:   06/01/16 0101 06/01/16 0537 06/01/16 1010 06/01/16 1020  BP: 174/90 186/77 127/75 148/88  Pulse: 70 62 70 66  Temp: 97.8 F (36.6 C) 98.2 F (36.8 C) 98.1 F (36.7 C) 98 F (36.7 C)  TempSrc: Oral Oral Rectal Oral  Resp: _0 Height:      Weight:      SpO2: 98% 96% 97% 98%    Intake/Output Summary (Last 24 hours) at 06/01/16 1422 Last data filed at 06/01/16 0341  Gross per 24 hour  Intake      0 ml  Output   2500 ml  Net  -2500 ml    Weight change:  Exam:   General:  Pleasant elderly frail male patient lying comfortably supine in bed.  Cardiovascular: RRR, S1/S2, no rubs, no gallops. Not on telemetry.  Respiratory: clear to auscultation. No increased work of breathing.  Abdomen: Soft/+BS, non tender, non distended, no guarding  Extremities: No edema, No lymphangitis, No petechiae, No rashes, no synovitis. Left upper arm PICC line.  CNS: Alert and oriented 3. No focal deficits.  MSS: Has TLSO  brace when upright.   Data Reviewed: I have personally reviewed following labs and imaging studies Basic Metabolic Panel:  Recent Labs Lab 05/26/16 0546 05/30/16 1010 05/31/16 0425 06/01/16 0441  NA 134* 138 136 135  K 4.0 3.7 3.3* 3.9  CL 100* 100* 101 101  CO2 _1 GLUCOSE 114* 133* 105* 107*  BUN _2 CREATININE 1.06 1.28* 1.18 1.09  CALCIUM 8.0* 8.4* 8.0* 8.4*   Liver Function Tests: No results for input(s): AST, ALT, ALKPHOS, BILITOT, PROT, ALBUMIN in the last 168 hours. No results for input(s): LIPASE, AMYLASE in the last 168 hours. No results for input(s): AMMONIA in the last 168 hours. Coagulation Profile:  Recent Labs Lab 05/28/16 0955 05/29/16 0545 05/30/16 1010 05/31/16 0425 06/01/16 0441  INR 1.82* 1.60* 1.76* 2.04* 1.93*   CBC:  Recent Labs Lab 05/29/16 0545 05/30/16 1304 05/31/16 0425 06/01/16 0441  WBC 12.8* 9.2 8.6 6.5  NEUTROABS 8.8*  --   --   --   HGB 9.0* 8.9* 8.5* 8.8*  HCT 27.6* 28.0* 26.6* 28.0*  MCV 94.2 95.2 96.0 94.3  PLT 317 346 340 359   Cardiac Enzymes: No results for input(s): CKTOTAL, CKMB, CKMBINDEX, TROPONINI in the last 168 hours. BNP: Invalid input(s): POCBNP CBG: No results for input(s): GLUCAP in the last 168 hours. HbA1C: No results for input(s): HGBA1C in the last 72 hours. Urine analysis:    Component Value Date/Time   COLORURINE YELLOW 05/22/2016 1754   APPEARANCEUR CLEAR 05/22/2016 1754   LABSPEC 1.016 05/22/2016 1754   PHURINE 7.0 05/22/2016  [place] 05/22/2016 [place] 09/28/2008 1243   HGBUR NEGATIVE 05/22/2016 1754   BILIRUBINUR NEGATIVE 05/22/2016 1754   KETONESUR NEGATIVE 05/22/2016 1754   PROTEINUR NEGATIVE 05/22/2016 1754   UROBILINOGEN 0.2 mg/dL 09/28/2008 1243   NITRITE NEGATIVE 05/22/2016 1754   LEUKOCYTESUR NEGATIVE 05/22/2016 1754   Sepsis Labs: _0 (procalcitonin:4,lacticidven:4) ) Recent Results (from the past 240 hour(s))    Blood culture (routine x 2)     Status: Abnormal   Collection Time: 05/22/16 10:12 PM  Result Value Ref Range Status   Specimen Description BLOOD RIGHT HAND  Final   Special Requests IN PEDIATRIC BOTTLE 2ML  Final   Culture  Setup Time   Final    GRAM POSITIVE COCCI IN PAIRS IN CHAINS CRITICAL RESULT CALLED TO, READ BACK BY AND VERIFIED WITH: TValinda Party.D. 17:20 05/23/16 ([email or web address]) IN PEDIATRIC BOTTLE    Culture (A)  Final    ENTEROCOCCUS SPECIES SUSCEPTIBILITIES PERFORMED ON PREVIOUS CULTURE WITHIN THE LAST 5 DAYS.    Report Status 05/25/2016 FINAL  Final  Blood culture (routine x 2)     Status: Abnormal   Collection Time: 05/22/16 10:18 PM  Result Value Ref Range Status   Specimen Description BLOOD LEFT FOREARM  Final   Special Requests BOTTLES DRAWN AEROBIC AND ANAEROBIC 5ML  Final   Culture  Setup Time   Final    GRAM POSITIVE COCCOBACILLUS IN BOTH AEROBIC AND ANAEROBIC BOTTLES CRITICAL RESULT CALLED TO, READ BACK BY AND VERIFIED WITH: T EGAN,PHARMD AT 1720 05/23/16 BY M Centner    Culture ENTEROCOCCUS SPECIES (A)  Final   Report Status 05/25/2016 FINAL  Final   Organism ID, Bacteria ENTEROCOCCUS SPECIES  Final      Susceptibility   Enterococcus species - MIC*    AMPICILLIN <=2 SENSITIVE Sensitive     VANCOMYCIN 1 SENSITIVE Sensitive     GENTAMICIN SYNERGY SENSITIVE Sensitive     * ENTEROCOCCUS SPECIES  Blood Culture ID Panel (Reflexed)     Status: Abnormal   Collection Time: 05/22/16 10:18 PM  Result Value Ref Range Status   Enterococcus species DETECTED (A) NOT DETECTED Final    Comment: CRITICAL RESULT CALLED TO, READ BACK BY AND VERIFIED WITH: TValinda Party.D. 17:20 05/23/16 ([email or web address])    Vancomycin resistance NOT DETECTED NOT DETECTED Final   Listeria monocytogenes NOT DETECTED NOT DETECTED Final   Staphylococcus species NOT DETECTED NOT DETECTED Final   Staphylococcus aureus NOT DETECTED NOT DETECTED Final   Methicillin resistance NOT DETECTED NOT DETECTED Final    Streptococcus species NOT DETECTED NOT DETECTED Final   Streptococcus agalactiae NOT DETECTED NOT DETECTED Final   Streptococcus pneumoniae NOT DETECTED NOT DETECTED Final   Streptococcus pyogenes NOT DETECTED NOT DETECTED Final   Acinetobacter baumannii NOT DETECTED NOT DETECTED Final   Enterobacteriaceae species NOT DETECTED NOT DETECTED Final   Enterobacter cloacae complex NOT DETECTED NOT DETECTED Final   Escherichia coli NOT DETECTED NOT DETECTED Final   Klebsiella oxytoca NOT DETECTED NOT DETECTED Final   Klebsiella pneumoniae NOT DETECTED NOT DETECTED Final   Proteus species NOT DETECTED NOT DETECTED Final   Serratia marcescens NOT DETECTED NOT DETECTED Final   Carbapenem resistance NOT DETECTED NOT DETECTED Final   Haemophilus influenzae NOT DETECTED NOT DETECTED Final   Neisseria meningitidis NOT DETECTED NOT DETECTED Final   Pseudomonas aeruginosa NOT DETECTED NOT DETECTED Final   Candida albicans NOT DETECTED NOT DETECTED Final   Candida glabrata NOT DETECTED NOT DETECTED  Final   Candida krusei NOT DETECTED NOT DETECTED Final   Candida parapsilosis NOT DETECTED NOT DETECTED Final   Candida tropicalis NOT DETECTED NOT DETECTED Final  MRSA PCR Screening     Status: None   Collection Time: 05/23/16  1:17 AM  Result Value Ref Range Status   MRSA by PCR NEGATIVE NEGATIVE Final    Comment:        The GeneXpert MRSA Assay (FDA approved for NASAL specimens only), is one component of a comprehensive MRSA colonization surveillance program. It is not intended to diagnose MRSA infection nor to guide or monitor treatment for MRSA infections.   Culture, blood (Routine X 2) w Reflex to ID Panel     Status: None   Collection Time: 05/24/16  4:54 AM  Result Value Ref Range Status   Specimen Description BLOOD RIGHT ARM  Final   Special Requests AEROBIC BOTTLE ONLY 5ML  Final   Culture NO GROWTH 5 DAYS  Final   Report Status 05/29/2016 FINAL  Final  Culture, blood (Routine X 2)  w Reflex to ID Panel     Status: None   Collection Time: 05/24/16  4:56 AM  Result Value Ref Range Status   Specimen Description BLOOD RIGHT ARM  Final   Special Requests AEROBIC BOTTLE ONLY 3ML  Final   Culture NO GROWTH 5 DAYS  Final   Report Status 05/29/2016 FINAL  Final  Aerobic/Anaerobic Culture (surgical/deep wound)     Status: None   Collection Time: 05/26/16  4:07 PM  Result Value Ref Range Status   Specimen Description TISSUE  Final   Special Requests LUMBAR L2 OF BACK  Final   Gram Stain   Final    ABUNDANT WBC PRESENT,BOTH PMN AND MONONUCLEAR NO ORGANISMS SEEN    Culture   Final    FEW ENTEROCOCCUS SPECIES NO ANAEROBES ISOLATED CRITICAL RESULT CALLED TO, READ BACK BY AND VERIFIED WITH: Lenis Noon AT 1004 05/31/16 BY L BENFIELD    Report Status 05/31/2016 FINAL  Final   Organism ID, Bacteria ENTEROCOCCUS SPECIES  Final      Susceptibility   Enterococcus species - MIC*    AMPICILLIN <=2 SENSITIVE Sensitive     VANCOMYCIN 1 SENSITIVE Sensitive     GENTAMICIN SYNERGY SENSITIVE Sensitive     * FEW ENTEROCOCCUS SPECIES     Scheduled Meds: . amiodarone  100 mg Oral Daily  . ampicillin (OMNIPEN) IV  2 g Intravenous Q6H  . levothyroxine  25 mcg Oral QAC breakfast  . polyethylene glycol  17 g Oral BID  . senna  2 tablet Oral Daily  . simvastatin  20 mg Oral QHS  . sodium chloride flush  10-40 mL Intracatheter Q12H  . warfarin  7.5 mg Oral ONCE-1800  . Warfarin - Pharmacist Dosing Inpatient   Does not apply q1800   Continuous Infusions: . sodium chloride 0.9 % 1,000 mL infusion      Procedures/Studies: Ct Head Wo Contrast  05/22/2016  CLINICAL DATA:  Back pain, leg pain EXAM: CT HEAD WITHOUT CONTRAST TECHNIQUE: Contiguous axial images were obtained from the base of the skull through the vertex without intravenous contrast. COMPARISON:  09/21/2011 FINDINGS: Brain: No evidence of acute infarction, hemorrhage, extra-axial collection, ventriculomegaly, or mass effect.  There is an area of encephalomalacia in the right parietal lobe. 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 Generalized cerebral atrophy. Periventricular white matter low attenuation likely secondary to microangiopathy. Vascular: Cerebrovascular atherosclerotic calcifications are noted. Skull:  Negative for fracture or focal lesion. Sinuses/Orbits: Visualized portions of the orbits are unremarkable. Visualized portions of the paranasal sinuses and mastoid air cells are unremarkable. Other: None. IMPRESSION: 1. No acute intracranial pathology. 2. Chronic microvascular disease and cerebral atrophy. Electronically Signed   By: Kathreen Devoid   On: 05/22/2016 21:17   Ct Lumbar Spine Wo Contrast  05/22/2016  CLINICAL DATA:  Pain post fall EXAM: CT LUMBAR SPINE WITHOUT CONTRAST TECHNIQUE: Multidetector CT imaging of the lumbar spine was performed without intravenous contrast administration. Multiplanar CT image reconstructions were also generated. COMPARISON:  CT 10/26/2007 FINDINGS: Five non rib bearing lobar is segments labeled L1-L5. T11-12: Bridging anterior endplate osteophytes. Osteopenia. Central canal and foramina patent. T12-L1: Bridging anterior osteophytes. Central canal and foramina patent. L1-2: Compression fracture deformity involving inferior endplate of L1 with a estimated 50% loss of height, more severe right than left. Additionally, there is burst fracture of L2 with greater than 50% loss of height anteriorly left greater than right. Fracture at L2 extends to involve the pedicles bilaterally, minimally displaced. Approximately 4 mm retropulsion at the level of the superior endplate of L2. There is a small amount of gas in the interspace extending up into the fractured component of L1. No unexpected soft tissue component. L2-3:  Mild disc bulge.  Mild bilateral facet DJD. L3-4:  Disc bulge.  Bilateral facet DJD.  Probable spinal stenosis. L4-5: Disc narrowing. Disc bulge. Grade 1  anterolisthesis probably secondary to the moderate facet DJD. Probable spinal stenosis. L5-S1:  Asymmetric disc narrowing.  Mild bulge.  Facet DJD. Fusion across bilateral sacroiliac joints. Patchy aortoiliac arterial calcifications without aneurysm. Cardiac pacing leads partially visualized. IMPRESSION: 1. Compression fracture of L1 and burst fracture of L2, as detailed above, probably posttraumatic. No other findings to suggest discitis or metastatic involvement. Findings were reviewed with Dr. Nevada Crane, who concurs. 2. Spondylitic changes L2 to S1, with probable spinal stenosis L3-4 and L4-5. Electronically Signed   By: Lucrezia Europe M.D.   On: 05/22/2016 18:05   Ct Hip Right Wo Contrast  05/22/2016  CLINICAL DATA:  Fall today. Severe lower back pain and right hip pain EXAM: CT OF THE RIGHT HIP WITHOUT CONTRAST TECHNIQUE: Multidetector CT imaging of the right hip was performed according to the standard protocol. Multiplanar CT image reconstructions were also generated. COMPARISON:  03/16/2016 FINDINGS: Negative for fracture no significant effusion. Minor spurring from the femoral head and superior acetabulum. Right superior and inferior ischiopubic rami intact. Fusion across the right SI joint. Patchy iliac arterial calcifications without aneurysm. Urinary bladder is distended. IMPRESSION: 1. Negative.  No hip fracture. Electronically Signed   By: Lucrezia Europe M.D.   On: 05/22/2016 18:08   Ir Fluoro Guide Ndl Plmt / Bx  05/28/2016  INDICATION: Severe low back pain due to L2 fracture.  Osteomyelitis. EXAM: IR FLUORO GUIDE NEEDLE PLACEMENT /BIOPSY AT L2: MEDICATIONS: Versed 1.5 mg IV.  Fentanyl 50 mcg IV. ANESTHESIA/SEDATION: Moderate (conscious) sedation was employed during this procedure. A total of Versed 1.5 mg and Fentanyl 50 mcg was administered intravenously. Moderate Sedation Time: 15 minutes. The patient's level of consciousness and vital signs were monitored continuously by radiology nursing throughout the  procedure under my direct supervision. FLUOROSCOPY TIME:  Fluoroscopy Time: 3 minutes 48 seconds (187 mGy). COMPLICATIONS: None immediate. PROCEDURE: Informed written consent was obtained from the patient after a thorough discussion of the procedural risks, benefits and alternatives. All questions were addressed. Maximal Sterile Barrier Technique was utilized including caps, mask,  sterile gowns, sterile gloves, sterile drape, hand hygiene and skin antiseptic. A timeout was performed prior to the initiation of the procedure. Following a full explanation of the procedure along with the potential associated complications, an informed witnessed consent was obtained. The skin overlying the lumbar region was then prepped and draped in the usual sterile fashion. The right pedicle at L2 was then identified and infiltrated with 0.25% bupivacaine including the overlying skin. Using biplane intermittent fluoroscopy, an 11 gauge Jamshidi needle was then advanced to the right pedicle into the posterior 1/3 at L2. This was then exchanged out for a Kyphon advanced osteo introducer system comprised of a working cannula and a Kyphon osteo drill. This combination was then advanced over a Kyphon osteo bone pin until the working cannula was in the posterior 1/3 at L2. At this time the osteo drill was removed. The core samples obtained were sent for pathologic analysis Two passes were then made with a Kyphon bone biopsy device in different directions. Using a 20 mL syringe, core biopsies were obtained and sent for microbiological and pathologic analysis Hemostasis was achieved at the skin entry site following removal of the working cannula. The patient tolerated the procedure well. There were no acute complications. IMPRESSION: Status post fluoroscopic guided deep core biopsies at L2 as described above. Electronically Signed   By: Luanne Bras M.D.   On: 05/26/2016 17:04   Dg Chest Port 1 View  05/23/2016  CLINICAL DATA:  Low  back pain, progressively worsening since fall April 2017. Cough. EXAM: PORTABLE CHEST 1 VIEW COMPARISON:  Chest CT 09/12/2013 FINDINGS: Right pacer in place with leads in the right atrium and right ventricle. Heart is normal size. No confluent airspace opacities. Scattered aortic calcifications. No acute bony abnormality. IMPRESSION: No active cardiopulmonary disease. Aortic atherosclerosis. Electronically Signed   By: Rolm Baptise M.D.   On: 05/23/2016 09:01    Bibi Economos, MD, FACP, FHM. [facility] Pager [phone]  If 7PM-7AM, please contact night-coverage [email or web address] Password [email or web address] 06/01/2016, 2:22 PM   LOS: 10 days

## 2016-06-02 DIAGNOSIS — M4648 Discitis, unspecified, sacral and sacrococcygeal region: Secondary | ICD-10-CM | POA: Diagnosis not present

## 2016-06-02 DIAGNOSIS — I10 Essential (primary) hypertension: Secondary | ICD-10-CM | POA: Diagnosis not present

## 2016-06-02 DIAGNOSIS — S32021D Stable burst fracture of second lumbar vertebra, subsequent encounter for fracture with routine healing: Secondary | ICD-10-CM | POA: Diagnosis not present

## 2016-06-02 DIAGNOSIS — S329XXA Fracture of unspecified parts of lumbosacral spine and pelvis, initial encounter for closed fracture: Secondary | ICD-10-CM | POA: Diagnosis not present

## 2016-06-02 DIAGNOSIS — N183 Chronic kidney disease, stage 3 (moderate): Secondary | ICD-10-CM | POA: Diagnosis not present

## 2016-06-02 DIAGNOSIS — R791 Abnormal coagulation profile: Secondary | ICD-10-CM | POA: Diagnosis not present

## 2016-06-02 DIAGNOSIS — R7881 Bacteremia: Secondary | ICD-10-CM | POA: Diagnosis not present

## 2016-06-02 DIAGNOSIS — M2550 Pain in unspecified joint: Secondary | ICD-10-CM | POA: Diagnosis not present

## 2016-06-02 DIAGNOSIS — I5030 Unspecified diastolic (congestive) heart failure: Secondary | ICD-10-CM | POA: Diagnosis not present

## 2016-06-02 DIAGNOSIS — K219 Gastro-esophageal reflux disease without esophagitis: Secondary | ICD-10-CM | POA: Diagnosis not present

## 2016-06-02 DIAGNOSIS — M6281 Muscle weakness (generalized): Secondary | ICD-10-CM | POA: Diagnosis not present

## 2016-06-02 DIAGNOSIS — I4891 Unspecified atrial fibrillation: Secondary | ICD-10-CM | POA: Diagnosis not present

## 2016-06-02 DIAGNOSIS — I482 Chronic atrial fibrillation: Secondary | ICD-10-CM | POA: Diagnosis not present

## 2016-06-02 DIAGNOSIS — R1310 Dysphagia, unspecified: Secondary | ICD-10-CM | POA: Diagnosis not present

## 2016-06-02 DIAGNOSIS — T8090XD Unspecified complication following infusion and therapeutic injection, subsequent encounter: Secondary | ICD-10-CM | POA: Diagnosis not present

## 2016-06-02 DIAGNOSIS — R860 Abnormal level of enzymes in specimens from male genital organs: Secondary | ICD-10-CM | POA: Diagnosis not present

## 2016-06-02 DIAGNOSIS — I872 Venous insufficiency (chronic) (peripheral): Secondary | ICD-10-CM | POA: Diagnosis not present

## 2016-06-02 DIAGNOSIS — R262 Difficulty in walking, not elsewhere classified: Secondary | ICD-10-CM | POA: Diagnosis not present

## 2016-06-02 DIAGNOSIS — S32010D Wedge compression fracture of first lumbar vertebra, subsequent encounter for fracture with routine healing: Secondary | ICD-10-CM | POA: Diagnosis not present

## 2016-06-02 DIAGNOSIS — E02 Subclinical iodine-deficiency hypothyroidism: Secondary | ICD-10-CM | POA: Diagnosis not present

## 2016-06-02 DIAGNOSIS — M545 Low back pain: Secondary | ICD-10-CM | POA: Diagnosis not present

## 2016-06-02 DIAGNOSIS — B952 Enterococcus as the cause of diseases classified elsewhere: Secondary | ICD-10-CM | POA: Diagnosis not present

## 2016-06-02 DIAGNOSIS — R410 Disorientation, unspecified: Secondary | ICD-10-CM | POA: Diagnosis not present

## 2016-06-02 DIAGNOSIS — E785 Hyperlipidemia, unspecified: Secondary | ICD-10-CM | POA: Diagnosis not present

## 2016-06-02 DIAGNOSIS — R1010 Upper abdominal pain, unspecified: Secondary | ICD-10-CM | POA: Diagnosis not present

## 2016-06-02 DIAGNOSIS — R1312 Dysphagia, oropharyngeal phase: Secondary | ICD-10-CM | POA: Diagnosis not present

## 2016-06-02 DIAGNOSIS — I69391 Dysphagia following cerebral infarction: Secondary | ICD-10-CM | POA: Diagnosis not present

## 2016-06-02 DIAGNOSIS — D649 Anemia, unspecified: Secondary | ICD-10-CM | POA: Diagnosis not present

## 2016-06-02 DIAGNOSIS — Z950 Presence of cardiac pacemaker: Secondary | ICD-10-CM | POA: Diagnosis not present

## 2016-06-02 DIAGNOSIS — Z452 Encounter for adjustment and management of vascular access device: Secondary | ICD-10-CM | POA: Diagnosis not present

## 2016-06-02 DIAGNOSIS — R269 Unspecified abnormalities of gait and mobility: Secondary | ICD-10-CM | POA: Diagnosis not present

## 2016-06-02 DIAGNOSIS — E038 Other specified hypothyroidism: Secondary | ICD-10-CM

## 2016-06-02 DIAGNOSIS — M160 Bilateral primary osteoarthritis of hip: Secondary | ICD-10-CM | POA: Diagnosis not present

## 2016-06-02 DIAGNOSIS — R58 Hemorrhage, not elsewhere classified: Secondary | ICD-10-CM | POA: Diagnosis not present

## 2016-06-02 DIAGNOSIS — M353 Polymyalgia rheumatica: Secondary | ICD-10-CM | POA: Diagnosis not present

## 2016-06-02 DIAGNOSIS — E860 Dehydration: Secondary | ICD-10-CM | POA: Diagnosis not present

## 2016-06-02 DIAGNOSIS — E507 Other ocular manifestations of vitamin A deficiency: Secondary | ICD-10-CM | POA: Diagnosis not present

## 2016-06-02 DIAGNOSIS — G629 Polyneuropathy, unspecified: Secondary | ICD-10-CM | POA: Diagnosis not present

## 2016-06-02 DIAGNOSIS — R509 Fever, unspecified: Secondary | ICD-10-CM | POA: Diagnosis not present

## 2016-06-02 DIAGNOSIS — M4646 Discitis, unspecified, lumbar region: Secondary | ICD-10-CM | POA: Diagnosis not present

## 2016-06-02 DIAGNOSIS — M5136 Other intervertebral disc degeneration, lumbar region: Secondary | ICD-10-CM | POA: Diagnosis not present

## 2016-06-02 DIAGNOSIS — T148 Other injury of unspecified body region: Secondary | ICD-10-CM | POA: Diagnosis not present

## 2016-06-02 DIAGNOSIS — E039 Hypothyroidism, unspecified: Secondary | ICD-10-CM | POA: Diagnosis not present

## 2016-06-02 DIAGNOSIS — S32001A Stable burst fracture of unspecified lumbar vertebra, initial encounter for closed fracture: Secondary | ICD-10-CM | POA: Diagnosis not present

## 2016-06-02 DIAGNOSIS — G9340 Encephalopathy, unspecified: Secondary | ICD-10-CM | POA: Diagnosis not present

## 2016-06-02 DIAGNOSIS — R918 Other nonspecific abnormal finding of lung field: Secondary | ICD-10-CM | POA: Diagnosis not present

## 2016-06-02 LAB — PROTIME-INR
INR: 2.23 — AB (ref 0.00–1.49)
PROTHROMBIN TIME: 24.5 s — AB (ref 11.6–15.2)

## 2016-06-02 MED ORDER — SODIUM CHLORIDE 0.9 % IV SOLN: 2.0000 g | Freq: Four times a day (QID) | INTRAVENOUS | Status: AC

## 2016-06-02 MED ORDER — WARFARIN SODIUM 5 MG PO TABS
2.5000 mg | ORAL_TABLET | ORAL | Status: AC
Start: 2016-06-02 — End: ?

## 2016-06-02 MED ORDER — HEPARIN SOD (PORK) LOCK FLUSH 100 UNIT/ML IV SOLN
250.0000 [IU] | INTRAVENOUS | Status: AC | PRN
  Administered 2016-06-02: 250 [IU]

## 2016-06-02 MED ORDER — OXYCODONE HCL 5 MG PO TABS: 5.0000 mg | ORAL_TABLET | Freq: Three times a day (TID) | ORAL | Status: DC | PRN

## 2016-06-02 MED ORDER — ACETAMINOPHEN 500 MG PO TABS: 500.0000 mg | ORAL_TABLET | Freq: Four times a day (QID) | ORAL | Status: DC | PRN

## 2016-06-02 MED ORDER — SENNA 8.6 MG PO TABS: 2.0000 | ORAL_TABLET | Freq: Every day | ORAL | Status: DC

## 2016-06-02 MED ORDER — POLYETHYLENE GLYCOL 3350 17 G PO PACK: 17.0000 g | PACK | Freq: Two times a day (BID) | ORAL | Status: DC

## 2016-06-02 MED ORDER — WARFARIN SODIUM 5 MG PO TABS
5.0000 mg | ORAL_TABLET | Freq: Once | ORAL | Status: AC
  Administered 2016-06-02: 5 mg via ORAL
  Filled 2016-06-02: qty 1

## 2016-06-02 NOTE — Progress Notes (Signed)
Patient will DC to: [facility] Anticipated DC date: 06/02/16 Family notified: Wife Transport by: Glenna Fellows   Per MD patient ready for DC to [facility]. RN, patient, patient's family, and facility notified of DC. RN given number for report. DC packet on chart. Ambulance transport requested for patient.   CSW signing off.  Cedric Fishman, [place] Social Worker [phone]

## 2016-06-02 NOTE — Progress Notes (Signed)
ANTICOAGULATION CONSULT NOTE - Follow Up Consult  Pharmacy Consult for warfarin Indication: atrial fibrillation  Allergies  Allergen Reactions  . Guaifenesin Er Palpitations    Pt can not take mucinex PM  . Prednisone Other (See Comments)    HIGH doses make patient feel "crazy"    Patient Measurements: Height: 5\' 8"  (172.7 cm) Weight: 150 lb 9.2 oz (68.3 kg) IBW/kg (Calculated) : 68.4 Heparin Dosing Weight: 68.3 kg  Vital Signs: Temp: 97.9 F (36.6 C) (07/03 0535) Temp Source: Oral (07/03 0535) BP: 170/78 mmHg (07/03 0535) Pulse Rate: 67 (07/03 0535)  Labs:  Recent Labs  05/30/16 1010  05/30/16 1304 05/31/16 0425 06/01/16 0441 06/02/16 0526  HGB  --   < > 8.9* 8.5* 8.8*  --   HCT  --   --  28.0* 26.6* 28.0*  --   PLT  --   --  346 340 359  --   LABPROT 20.5*  --   --  22.9* 22.0* 24.5*  INR 1.76*  --   --  2.04* 1.93* 2.23*  HEPARINUNFRC 0.30  --   --  0.33  --   --   CREATININE 1.28*  --   --  1.18 1.09  --   < > = values in this interval not displayed.  Estimated Creatinine Clearance: 44.4 mL/min (by C-G formula based on Cr of 1.09).  Assessment: 82 yo M with hx AFib on chronic anticoagulation with warfarin. Pharmacy consulted to dose warfarin. INR therapeutic this morning at 2.23 < 1.93, following 7.5 mg dose last evening. CBC stable from 7/2.   PTA dose: Warfarin 5 mg MWF, 2.5 mg AOD  Goal of Therapy:  INR 2-3 Monitor platelets by anticoagulation protocol: Yes   Plan:  1. Warfarin 5 mg x 1 tonight 2. Daily INR, CBC 3. If discharged today would resume home/PTA dosing of warfarin (5 mg MWF, 2.5 mg AODs)  Vincenza Hews, PharmD, BCPS 06/02/2016, 8:41 AM Pager: [phone]

## 2016-06-02 NOTE — Discharge Instructions (Signed)
Bacteremia °Bacteremia is the presence of bacteria in the blood. A small amount of bacteria may not cause any symptoms. °Sometimes, the bacteria spread and cause infection in other parts of the body, such as the heart, joints, bones, or brain. Having a great amount of bacteria can cause a serious, sometimes life-threatening infection called sepsis. °CAUSES °This condition is caused by bacteria that get into the blood. Bacteria can enter the blood: °· During a dental or medical procedure. °· After you brush your teeth so hard that the gums bleed. °· Through a scrape or cut on your skin. °More severe types of bacteremia can be caused by: °· A bacterial infection, such as pneumonia, that spreads to the blood. °· Using a dirty needle. °RISK FACTORS °This condition is more likely to develop in: °· Children and elderly adults. °· People who have a long-lasting (chronic) disease or medical condition. °· People who have an artificial joint or heart valve. °· People who have heart valve disease. °· People who have a tube, such as a catheter or IV tube, that has been inserted for a medical treatment. °· People who have a weak body defense system (immune system). °· People who use IV drugs. °SYMPTOMS °Usually, this condition does not cause symptoms when it is mild. When it is more serious, it may cause: °· Fever. °· Chills. °· Racing heart. °· Shortness of breath. °· Dizziness. °· Weakness. °· Confusion. °· Nausea or vomiting. °· Diarrhea. °Bacteremia that has spread to other parts of the body may cause symptoms in those areas. °DIAGNOSIS °This condition may be diagnosed with a physical exam and tests, such as: °· A complete blood count (CBC). This test looks for signs of infection. °· Blood cultures. These look for bacteria in your blood. °· Tests of any IV tubes. These look for a source of infection. °· Urine tests. °· Imaging tests, such as an X-ray, CT scan, MRI, or heart ultrasound. °TREATMENT °If the condition is mild,  treatment is usually not needed. Usually, the body's immune system will remove the bacteria. If the condition is more serious, it may be treated with: °· Antibiotic medicines through an IV tube. These may be given for about 2 weeks. At first, the antibiotic that is given may kill most types of blood bacteria. If your test results show that a certain kind of bacteria is causing problems, the antibiotic may be changed to kill only the bacteria that are causing problems. °· Antibiotics taken by mouth. °· Removing any catheter or IV tube that is a source of infection. °· Blood pressure and breathing support, if needed. °· Surgery to control the source or spread of infection, if needed. °HOME CARE INSTRUCTIONS °· Take over-the-counter and prescription medicines only as told by your health care provider. °· If you were prescribed an antibiotic, take it as told by your health care provider. Do not stop taking the antibiotic even if you start to feel better. °· Rest at home until your condition is under control. °· Drink enough fluid to keep your urine clear or pale yellow. °· Keep all follow-up visits as told by your health care provider. This is important. °PREVENTION °Take these actions to help prevent future episodes of bacteremia: °· Get all vaccinations as recommended by your health care provider. °· Clean and cover scrapes or cuts. °· Bathe regularly. °· Wash your hands often. °· Before any dental or surgical procedure, ask your health care provider if you should take an antibiotic. °SEEK MEDICAL   CARE IF:  Your symptoms get worse.  You continue to have symptoms after treatment.  You develop new symptoms after treatment. SEEK IMMEDIATE MEDICAL CARE IF:  You have chest pain or trouble breathing.  You develop confusion, dizziness, or weakness.  You develop pale skin.   This information is not intended to replace advice given to you by your health care provider. Make sure you discuss any questions you have  with your health care provider.   Document Released: 08/31/2006 Document Revised: 08/08/2015 Document Reviewed: 01/20/2015 Elsevier Interactive Patient Education 2016 [place] is irritation and swelling (inflammation) of the disks in the spine. Disks are soft structures that cushion the bones of the spine. This is not a common condition. Diskitis most often affects the disks of the lower back (lumbar disks) or upper back (thoracic disks). CAUSES  A bacterial or viral infection can lead to diskitis. When diskitis develops, it is often accompanied by infection-induced inflammation of the bones (osteomyelitis) surrounding the spine. The condition can also be caused by inflammation from another condition, like an autoimmune disease. If you have an autoimmune disease, your body's defense system (immune system) mistakenly attacks your own healthy cells instead of germs and other things that can make you sick. RISK FACTORS People at higher risk of developing diskitis include:  Children.  Older persons.  People with weak immune systems or immune system disorders.  People with diabetes.  People having chemotherapy. SIGNS AND SYMPTOMS  Back or stomach pain is the most common symptom of diskitis. Walking, standing, and sitting may be painful. Other symptoms may include:   Trouble standing or rising from a sitting position.  Fever lower than 102F (38.9C).  Back stiffness.  Flu-like symptoms, such as a sore throat and a runny nose.  Irritability.  Feeling pain when the affected area is touched. DIAGNOSIS  Your health care provider can diagnose diskitis based on symptoms and medical history. Your health care provider will also do a physical exam. You may also need to have blood tests or imaging studies, such as:  X-ray of the spine.  MRI of the spine.  A bone scan. TREATMENT  Treatment for diskitis may include:  Bed rest.  Medicines, such  as:  Antibiotics to treat a possible bacterial infection.  Anti-inflammatory medicines.  Steroids if the condition does not improve over time.  Pain-relieving medicines.  A brace to stop your back from moving. HOME CARE INSTRUCTIONS  If you were prescribed an antibiotic medicine, finish it all even if you start to feel better.  Take medicines only as directed by your health care provider.  Keep all follow-up visits as directed by your health care provider. This is important. SEEK MEDICAL CARE IF:   You have difficulty walking or standing.  You have persistent back pain.  You are having side effects from medicines.   This information is not intended to replace advice given to you by your health care provider. Make sure you discuss any questions you have with your health care provider.   Document Released: 10/18/2004 Document Revised: 12/08/2014 Document Reviewed: 03/22/2014 Elsevier Interactive Patient Education [employer].

## 2016-06-02 NOTE — Care Management Important Message (Signed)
Important Message  Patient Details  Name: [name] MRN: [id number] Date of Birth: 06-24-1924   Medicare Important Message Given:  Yes    Edel Rivero Abena 06/02/2016, 11:24 AM

## 2016-06-02 NOTE — Care Management Note (Signed)
Case Management Note  Patient Details  Name: Keyshun Cohn MRN: [id number] Date of Birth: [birth year]  Subjective/Objective:                    Action/Plan: Pt discharging to [facility] today. No further needs per CM.   Expected Discharge Date:                  Expected Discharge Plan:  Skilled Nursing Facility  In-House Referral:  Clinical Social Work  Discharge planning Services  CM Consult  Post Acute Care Choice:    Choice offered to:     DME Arranged:    DME Agency:     HH Arranged:    [place] Agency:     Status of Service:  In process, will continue to follow  If discussed at Long Length of Stay Meetings, dates discussed:    Additional Comments:  Pollie Friar, RN 06/02/2016, 10:19 AM

## 2016-06-02 NOTE — Progress Notes (Signed)
Pt transferred to SNF per orders from MD. Pt educated on transfer orders and family informed. Pt's PICC capped for SNF by IV team. RN called SNF to give report. RN gave report to [place]. Lattie Haw informed about giving pt dosage for Coumadin today. Pt exited hospital via stretcher.

## 2016-06-02 NOTE — Discharge Summary (Addendum)
Physician Discharge Summary  [name] XFG:[id number] DOB: 27-Dec-1923  PCP: Merrilee Seashore, MD  Outpatient Specialists:   Dr Rayann Heman (EP)  Dr Leta Baptist (Neuro)  Admit date: 05/22/2016 Discharge date: 06/02/2016  Admitted From: Home Disposition:  [facility]  Recommendations for Outpatient Follow-up:  1. M.D. at SNF in 2 days with repeat labs (CBC with differential, PT, INR, CMP, CRP & ESR). Adjust Coumadin dose as needed. 2. Weekly labs (CBC with differential, CMP, CRP, ESR)-next draw 06/04/16. Please fax these results to Dr. Carlyle Basques at [facility] at fax number [phone]. 3. Continue IV ampicillin through 07/08/2016. Follow-up with infectious disease to guarding timing of removal of PICC line that was placed in the hospital. 4. Dr. Carlyle Basques, Infectious Disease: In 4-6 weeks. Infectious disease M.D. will arrange but SNF to coordinate same. 5. Dr. Merrilee Seashore, PCP upon discharge from SNF. 6. TLSO brace to help with pain and support. 7. Recommend repeating TSH in 4 weeks.  Home Health: None Equipment/Devices: None    Discharge Condition: Improved and stable.  CODE STATUS: DO NOT RESUSCITATE  Diet recommendation: Heart healthy diet.  Brief/Interim Summary: 82 year old male with a history of atrial fibrillation, TIA, hypertension, sick sinus syndrome status post PPM presented with worsening low back pain and confusion. The patient was admitted to [facility] from 03/16/2016 through 03/18/2016 after suffering a mechanical fall resulting in increasing right hip pain. Workup including CT of the hip at that time did not reveal a fracture. He was sent to skilled nursing facility for rehabilitation. Unfortunately, pt continued to have significant back pain and declining mental status. As a result, the patient was brought to the emergency department. CT of the lumbar spine revealed compression fracture of L1 and burst fracture of L2 with ESR 140.  The patient was seen by neurosurgery who felt that the patient was a poor surgical candidate. Interventional radiology was consulted for biopsy which was done on 05/26/16. Now enterococcal bacteremia and discitis. Both blood cultures and disc site culture results confirmed enterococcus. Surveillance blood cultures negative. TEE negative for vegetations. Plan at discharge would be ampicillin IV continuous infusion through 07/08/16 followed by outpatient surveillance blood culture at the infectious disease clinic.   Discharge Diagnoses:  Principal Problem:   Enterococcal bacteremia Active Problems:   Hypothyroidism   Hyperlipidemia   HYPERTENSION, BENIGN ESSENTIAL   ATRIAL FIBRILLATION   SICK SINUS SYNDROME   PERIPHERAL VASCULAR DISEASE   BPH (benign prostatic hyperplasia)   PACEMAKER, PERMANENT   Right hip pain   Elevated C-reactive protein (CRP)   Altered mental status   Compression fracture of L2 lumbar vertebra with delayed healing   Acute encephalopathy   Fever   Discitis of lumbar region  Enterococcus Bacteremia complicated by discitis - Likely secondary to UTI. As per discussion with ID, patient has been treated for multiple UTIs in the past. Most recent urine culture showed Klebsiella. -05/22/16 blood culture--enterococcus sp. -7/51/02--HEN--ID 78-24%, no embolic source -2/35/36--[id number] blood cultures--neg -05/26/16--L2 biopsy--enterococcus with same sensitivity to ampicillin -TEE 6/29: No evidence of endocarditis. 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 Mild-to-moderate aortic stenosis. - ID follow-up appreciated: Continue IV ampicillin as continuous infusion and plan to treat for 6-8 weeks (end date 07/08/2016). Will need weekly CBC, CMP, CRP and ESR with results faxed to [phone]. ID will arrange clinic follow-up in 4-6 weeks. - PICC line placed on 6/29.   L1-L2 compression fracture/Discitis-due to enterococcal infection -TLSO brace - Appreciate  neurosurgery consultation - MR not  possible due to PPM - Pain is adequately controlled.   Acute encephalopathy -check serum B12--644 -TSH--4.22 -Ammonia--22 -Chest x-ray--neg -UA--no pyuria - Patient was confused on 7/1 likely secondary to Ativan that he received the night prior. This has since resolved. Mental status back to baseline as per family at bedside. Avoid unnecessary sedating medications including narcotics or sleeping aids.  Atrial fibrillation -CHADS-VASc = 5 - Received vitamin K early on in admission to reverse INR. Post procedure, started heparin bridge and warfarin. INR therapeutic >2 on 7/1. Discontinued IV heparin bridge. No further blood oozing from PICC line. - Documented history of prior TIA. - INR: 2.23. As per pharmacy follow-up, continue prior home dose of Coumadin. Coumadin dose can be adjusted at SNF based on PT/INR follow-up.  Anemia - Stable.  CKD stage 3 -Baseline creatinine 1.0-1.2 -Stable.  Hypothyroidism -Continue Synthroid -TSH 4.299 - recheck TSH in 4 weeks  Hyperlipidemia -continue zocor  Sick sinus syndrome status post pacemaker -S/p PPM - Discussed with primary cardiologist Dr. Rayann Heman on 6/30, recommended continued conservative treatment and patient would be very high risk and poor candidate for pacemaker replacement surgery.  Essential hypertension - Mildly uncontrolled at times. May consider antihypertensives if persistently elevated.   Goals of care -discussed with family -05/26/16--change pt to DNR  Constipation - Multifactorial. Adjusted bowel regimen. Had BM 6/30.  Mild AKI - ? Poor PO. Encouraged oral fluids. Creatinine has normalized.  Hypokalemia - Replaced  DO NOT RESUSCITATE  Discharge Instructions      Discharge Instructions    Call MD for:  difficulty breathing, headache or visual disturbances    Complete by:  As directed      Call MD for:  extreme fatigue    Complete by:  As directed      Call MD for:   persistant dizziness or light-headedness    Complete by:  As directed      Call MD for:  persistant nausea and vomiting    Complete by:  As directed      Call MD for:  severe uncontrolled pain    Complete by:  As directed      Call MD for:  temperature >100.4    Complete by:  As directed      Diet - low sodium heart healthy    Complete by:  As directed      Increase activity slowly    Complete by:  As directed   TLSO brace to help with pain and support.            Medication List    STOP taking these medications        FIBER FORMULA Powd     HYDROcodone-acetaminophen 5-325 MG tablet  Commonly known as:  NORCO/VICODIN     tamsulosin 0.4 MG Caps capsule  Commonly known as:  FLOMAX      TAKE these medications        acetaminophen 500 MG tablet  Commonly known as:  TYLENOL  Take 1 tablet (500 mg total) by mouth every 6 (six) hours as needed for mild pain, moderate pain, fever or headache.     amiodarone 200 MG tablet  Commonly known as:  PACERONE  Take 0.5 tablets (100 mg total) by mouth daily.     ampicillin 2 g in sodium chloride 0.9 % 50 mL  Inject 2 g into the vein every 6 (six) hours. At SNF would prefer that IV ampicillin be given as continuous infusion. An option outlined  below or as per pharmacy at [facility].  Place ampicillin 8g in Normal Saline 526m. Infuse this mixture over 24 hours. Change the bag daily. The mixture is stable for 72 hours under refrigeration. The facility will need to store the bags under refrigeration until infused for the patient.     levothyroxine 25 MCG tablet  Commonly known as:  SYNTHROID, LEVOTHROID  Take 25 mcg by mouth daily before breakfast.     multivitamin tablet  Take 1 tablet by mouth daily.     oxyCODONE 5 MG immediate release tablet  Commonly known as:  Oxy IR/ROXICODONE  Take 1 tablet (5 mg total) by mouth every 8 (eight) hours as needed for severe pain.     polyethylene glycol packet  Commonly known as:  MIRALAX /  GLYCOLAX  Take 17 g by mouth 2 (two) times daily.     senna 8.6 MG Tabs tablet  Commonly known as:  SENOKOT  Take 2 tablets (17.2 mg total) by mouth daily.     simvastatin 20 MG tablet  Commonly known as:  ZOCOR  Take 20 mg by mouth at bedtime.     warfarin 5 MG tablet  Commonly known as:  COUMADIN  Take 0.5-1 tablets (2.5-5 mg total) by mouth as directed. Take 2.5 mg (0.5 tablet) on Tues, Thurs, Sat and Sun and Take 5 mg (1 tablet) on Mon, Wed and Fri       Follow-up Information    [facility] Schedule an appointment as soon as possible for a visit in 2 days.   Why:  To be seen with repeat labs (CBC, PT, INR & BMP).      Allergies  Allergen Reactions  . Guaifenesin Er Palpitations    Pt can not take mucinex PM  . Prednisone Other (See Comments)    HIGH doses make patient feel "crazy"    Consultations:  Neurosurgery  Infectious disease  Interventional radiology  Cardiology   Procedures/Studies: Ct Head Wo Contrast  05/22/2016  CLINICAL DATA:  Back pain, leg pain EXAM: CT HEAD WITHOUT CONTRAST TECHNIQUE: Contiguous axial images were obtained from the base of the skull through the vertex without intravenous contrast. COMPARISON:  09/21/2011 FINDINGS: Brain: No evidence of acute infarction, hemorrhage, extra-axial collection, ventriculomegaly, or mass effect. There is an area of encephalomalacia in the right parietal lobe. 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 Generalized cerebral atrophy. Periventricular white matter low attenuation likely secondary to microangiopathy. Vascular: Cerebrovascular atherosclerotic calcifications are noted. Skull: Negative for fracture or focal lesion. Sinuses/Orbits: Visualized portions of the orbits are unremarkable. Visualized portions of the paranasal sinuses and mastoid air cells are unremarkable. Other: None. IMPRESSION: 1. No acute intracranial pathology. 2. Chronic microvascular disease and cerebral  atrophy. Electronically Signed   By: HKathreen Devoid  On: 05/22/2016 21:17   Ct Lumbar Spine Wo Contrast  05/22/2016  CLINICAL DATA:  Pain post fall EXAM: CT LUMBAR SPINE WITHOUT CONTRAST TECHNIQUE: Multidetector CT imaging of the lumbar spine was performed without intravenous contrast administration. Multiplanar CT image reconstructions were also generated. COMPARISON:  CT 10/26/2007 FINDINGS: Five non rib bearing lobar is segments labeled L1-L5. T11-12: Bridging anterior endplate osteophytes. Osteopenia. Central canal and foramina patent. T12-L1: Bridging anterior osteophytes. Central canal and foramina patent. L1-2: Compression fracture deformity involving inferior endplate of L1 with a estimated 50% loss of height, more severe right than left. Additionally, there is burst fracture of L2 with greater than 50% loss of  height anteriorly left greater than right. Fracture at L2 extends to involve the pedicles bilaterally, minimally displaced. Approximately 4 mm retropulsion at the level of the superior endplate of L2. There is a small amount of gas in the interspace extending up into the fractured component of L1. No unexpected soft tissue component. L2-3:  Mild disc bulge.  Mild bilateral facet DJD. L3-4:  Disc bulge.  Bilateral facet DJD.  Probable spinal stenosis. L4-5: Disc narrowing. Disc bulge. Grade 1 anterolisthesis probably secondary to the moderate facet DJD. Probable spinal stenosis. L5-S1:  Asymmetric disc narrowing.  Mild bulge.  Facet DJD. Fusion across bilateral sacroiliac joints. Patchy aortoiliac arterial calcifications without aneurysm. Cardiac pacing leads partially visualized. IMPRESSION: 1. Compression fracture of L1 and burst fracture of L2, as detailed above, probably posttraumatic. No other findings to suggest discitis or metastatic involvement. Findings were reviewed with Dr. [place] Crane, who concurs. 2. Spondylitic changes L2 to S1, with probable spinal stenosis L3-4 and L4-5. Electronically  Signed   By: Lucrezia Europe M.D.   On: 05/22/2016 18:05   Ct Hip Right Wo Contrast  05/22/2016  CLINICAL DATA:  Fall today. Severe lower back pain and right hip pain EXAM: CT OF THE RIGHT HIP WITHOUT CONTRAST TECHNIQUE: Multidetector CT imaging of the right hip was performed according to the standard protocol. Multiplanar CT image reconstructions were also generated. COMPARISON:  03/16/2016 FINDINGS: Negative for fracture no significant effusion. Minor spurring from the femoral head and superior acetabulum. Right superior and inferior ischiopubic rami intact. Fusion across the right SI joint. Patchy iliac arterial calcifications without aneurysm. Urinary bladder is distended. IMPRESSION: 1. Negative.  No hip fracture. Electronically Signed   By: Lucrezia Europe M.D.   On: 05/22/2016 18:08   Ir Fluoro Guide Ndl Plmt / Bx  05/28/2016  INDICATION: Severe low back pain due to L2 fracture.  Osteomyelitis. EXAM: IR FLUORO GUIDE NEEDLE PLACEMENT /BIOPSY AT L2: MEDICATIONS: Versed 1.5 mg IV.  Fentanyl 50 mcg IV. ANESTHESIA/SEDATION: Moderate (conscious) sedation was employed during this procedure. A total of Versed 1.5 mg and Fentanyl 50 mcg was administered intravenously. Moderate Sedation Time: 15 minutes. The patient's level of consciousness and vital signs were monitored continuously by radiology nursing throughout the procedure under my direct supervision. FLUOROSCOPY TIME:  Fluoroscopy Time: 3 minutes 48 seconds (187 mGy). COMPLICATIONS: None immediate. PROCEDURE: Informed written consent was obtained from the patient after a thorough discussion of the procedural risks, benefits and alternatives. All questions were addressed. Maximal Sterile Barrier Technique was utilized including caps, mask, sterile gowns, sterile gloves, sterile drape, hand hygiene and skin antiseptic. A timeout was performed prior to the initiation of the procedure. Following a full explanation of the procedure along with the potential associated  complications, an informed witnessed consent was obtained. The skin overlying the lumbar region was then prepped and draped in the usual sterile fashion. The right pedicle at L2 was then identified and infiltrated with 0.25% bupivacaine including the overlying skin. Using biplane intermittent fluoroscopy, an 11 gauge Jamshidi needle was then advanced to the right pedicle into the posterior 1/3 at L2. This was then exchanged out for a Kyphon advanced osteo introducer system comprised of a working cannula and a Kyphon osteo drill. This combination was then advanced over a Kyphon osteo bone pin until the working cannula was in the posterior 1/3 at L2. At this time the osteo drill was removed. The core samples obtained were sent for pathologic analysis Two passes were then made with a Kyphon  bone biopsy device in different directions. Using a 20 mL syringe, core biopsies were obtained and sent for microbiological and pathologic analysis Hemostasis was achieved at the skin entry site following removal of the working cannula. The patient tolerated the procedure well. There were no acute complications. IMPRESSION: Status post fluoroscopic guided deep core biopsies at L2 as described above. Electronically Signed   By: Luanne Bras M.D.   On: 05/26/2016 17:04   Dg Chest Port 1 View  05/23/2016  CLINICAL DATA:  Low back pain, progressively worsening since fall April 2017. Cough. EXAM: PORTABLE CHEST 1 VIEW COMPARISON:  Chest CT 09/12/2013 FINDINGS: Right pacer in place with leads in the right atrium and right ventricle. Heart is normal size. No confluent airspace opacities. Scattered aortic calcifications. No acute bony abnormality. IMPRESSION: No active cardiopulmonary disease. Aortic atherosclerosis. Electronically Signed   By: Rolm Baptise M.D.   On: 05/23/2016 09:01  Fluoroscopy guided L2 biopsy by IR on 05/26/16 TTE 05/24/16: Study Conclusions  - Left ventricle: The cavity size was normal. There was mild  focal  basal hypertrophy of the septum. Systolic function was normal.  The estimated ejection fraction was in the range of 60% to 65%.  Wall motion was normal; there were no regional wall motion  abnormalities. Left ventricular diastolic function parameters  were normal. - Aortic valve: Calcified right coronary cusp. There was trivial  regurgitation. Valve area (VTI): 1.64 cm^2. Valve area (Vmax):  1.54 cm^2. Valve area (Vmean): 1.54 cm^2. - Atrial septum: No defect or patent foramen ovale was identified. TEE on 05/29/16: Study Conclusions  - Left ventricle: The cavity size was normal. Wall thickness was  increased in a pattern of mild LVH. Systolic function was normal.  The estimated ejection fraction was in the range of 60% to 65%.  Wall motion was normal; there were no regional wall motion  abnormalities. - Aortic valve: The aortic valve was severely calcified and  trileaflet. There was mild aortic insufficiency. There was mild  stenosis by mean gradient (11 mmHg) though visually, stenosis  looked moderate. No vegetation. - Aorta: The ascending aorta was mildly dilated at 3.9 cm. - Mitral valve: The mitral valve was thickened and myxomatous. I do  not think there was a vegetation as nothing was mobile. There was  mild bileaflet prolapse. There was eccentric, moderate to  moderate-severe mitral regurgitation. - Left atrium: The atrium was mildly dilated. No evidence of  thrombus in the atrial cavity or appendage. - Right ventricle: The cavity size was normal. Systolic function  was normal. - Right atrium: Pacemaker leads seen in right heart, no vegetation  noted on pacemaker leads. No evidence of thrombus in the atrial  cavity or appendage. - Atrial septum: No defect or patent foramen ovale was identified. - Tricuspid valve: No evidence of vegetation. - Pulmonic valve: No evidence of vegetation.  Impressions:  - No evidence for endocarditis. Myxomatous  mitral valve with  moderate to moderately-severe mitral regurgitation. Mild to  moderate aortic stenosis.    Subjective: Denies complaints. Anxious to discharge from hospital and go to rehabilitation. No pain reported. As per RN, no acute issues.  Discharge Exam:  Filed Vitals:   06/01/16 1810 06/01/16 2110 06/02/16 0142 06/02/16 0535  BP: 138/68 148/77 152/85 170/78  Pulse: 70 65 66 67  Temp: 98 F (36.7 C) 98.1 F (36.7 C) 97.9 F (36.6 C) 97.9 F (36.6 C)  TempSrc: Oral Oral Oral Oral  Resp: '18 18 18 18  '$ Height:  Weight:      SpO2: 98% 97% 99% 97%     General: Pleasant elderly frail male patient lying comfortably supine in bed.  Cardiovascular: RRR, S1/S2, no rubs, no gallops.   Respiratory: clear to auscultation. No increased work of breathing.  Abdomen: Soft/+BS, non tender, non distended, no guarding  Extremities: No edema, No lymphangitis, No petechiae, No rashes, no synovitis. Left upper arm PICC line.  CNS: Alert and oriented 3. No focal deficits.  MSS: Has TLSO brace when upright.    The results of significant diagnostics from this hospitalization (including imaging, microbiology, ancillary and laboratory) are listed below for reference.     Microbiology: Recent Results (from the past 240 hour(s))  Culture, blood (Routine X 2) w Reflex to ID Panel     Status: None   Collection Time: 05/24/16  4:54 AM  Result Value Ref Range Status   Specimen Description BLOOD RIGHT ARM  Final   Special Requests AEROBIC BOTTLE ONLY 5ML  Final   Culture NO GROWTH 5 DAYS  Final   Report Status 05/29/2016 FINAL  Final  Culture, blood (Routine X 2) w Reflex to ID Panel     Status: None   Collection Time: 05/24/16  4:56 AM  Result Value Ref Range Status   Specimen Description BLOOD RIGHT ARM  Final   Special Requests AEROBIC BOTTLE ONLY 3ML  Final   Culture NO GROWTH 5 DAYS  Final   Report Status 05/29/2016 FINAL  Final  Aerobic/Anaerobic Culture  (surgical/deep wound)     Status: None   Collection Time: 05/26/16  4:07 PM  Result Value Ref Range Status   Specimen Description TISSUE  Final   Special Requests LUMBAR L2 OF BACK  Final   Gram Stain   Final    ABUNDANT WBC PRESENT,BOTH PMN AND MONONUCLEAR NO ORGANISMS SEEN    Culture   Final    FEW ENTEROCOCCUS SPECIES NO ANAEROBES ISOLATED CRITICAL RESULT CALLED TO, READ BACK BY AND VERIFIED WITH: Lenis Noon AT 1004 05/31/16 BY L BENFIELD    Report Status 05/31/2016 FINAL  Final   Organism ID, Bacteria ENTEROCOCCUS SPECIES  Final      Susceptibility   Enterococcus species - MIC*    AMPICILLIN <=2 SENSITIVE Sensitive     VANCOMYCIN 1 SENSITIVE Sensitive     GENTAMICIN SYNERGY SENSITIVE Sensitive     * FEW ENTEROCOCCUS SPECIES     Labs: BNP (last 3 results) No results for input(s): BNP in the last 8760 hours. Basic Metabolic Panel:  Recent Labs Lab 05/30/16 1010 05/31/16 0425 06/01/16 0441  NA 138 136 135  K 3.7 3.3* 3.9  CL 100* 101 101  CO2 '28 27 27  '$ GLUCOSE 133* 105* 107*  BUN '13 11 9  '$ CREATININE 1.28* 1.18 1.09  CALCIUM 8.4* 8.0* 8.4*   Liver Function Tests: No results for input(s): AST, ALT, ALKPHOS, BILITOT, PROT, ALBUMIN in the last 168 hours. No results for input(s): LIPASE, AMYLASE in the last 168 hours. No results for input(s): AMMONIA in the last 168 hours. CBC:  Recent Labs Lab 05/29/16 0545 05/30/16 1304 05/31/16 0425 06/01/16 0441  WBC 12.8* 9.2 8.6 6.5  NEUTROABS 8.8*  --   --   --   HGB 9.0* 8.9* 8.5* 8.8*  HCT 27.6* 28.0* 26.6* 28.0*  MCV 94.2 95.2 96.0 94.3  PLT 317 346 340 359   Cardiac Enzymes: No results for input(s): CKTOTAL, CKMB, CKMBINDEX, TROPONINI in the last 168 hours. BNP: Invalid input(s):  POCBNP CBG: No results for input(s): GLUCAP in the last 168 hours. D-Dimer No results for input(s): DDIMER in the last 72 hours. Hgb A1c No results for input(s): HGBA1C in the last 72 hours. Lipid Profile No results for  input(s): CHOL, HDL, LDLCALC, TRIG, CHOLHDL, LDLDIRECT in the last 72 hours. Thyroid function studies No results for input(s): TSH, T4TOTAL, T3FREE, THYROIDAB in the last 72 hours.  Invalid input(s): FREET3 Anemia work up No results for input(s): VITAMINB12, FOLATE, FERRITIN, TIBC, IRON, RETICCTPCT in the last 72 hours. Urinalysis    Component Value Date/Time   COLORURINE YELLOW 05/22/2016 1754   APPEARANCEUR CLEAR 05/22/2016 1754   LABSPEC 1.016 05/22/2016 1754   PHURINE 7.0 05/22/2016 1754   GLUCOSEU NEGATIVE 05/22/2016 1754   GLUCOSEU NEGATIVE 09/28/2008 1243   HGBUR NEGATIVE 05/22/2016 1754   BILIRUBINUR NEGATIVE 05/22/2016 1754   KETONESUR NEGATIVE 05/22/2016 1754   PROTEINUR NEGATIVE 05/22/2016 1754   UROBILINOGEN 0.2 mg/dL 09/28/2008 1243   NITRITE NEGATIVE 05/22/2016 1754   LEUKOCYTESUR NEGATIVE 05/22/2016 1754   Sepsis Labs Invalid input(s): PROCALCITONIN,  WBC,  LACTICIDVEN    Time coordinating discharge: Over 30 minutes  SIGNED:  Vernell Leep, MD, FACP, FHM. [facility] Pager [phone]  If 7PM-7AM, please contact night-coverage [email or web address] Password [email or web address] 06/02/2016, 12:54 PM

## 2016-06-03 DIAGNOSIS — E02 Subclinical iodine-deficiency hypothyroidism: Secondary | ICD-10-CM | POA: Diagnosis not present

## 2016-06-03 DIAGNOSIS — M4646 Discitis, unspecified, lumbar region: Secondary | ICD-10-CM | POA: Diagnosis not present

## 2016-06-03 DIAGNOSIS — I482 Chronic atrial fibrillation: Secondary | ICD-10-CM | POA: Diagnosis not present

## 2016-06-06 DIAGNOSIS — M353 Polymyalgia rheumatica: Secondary | ICD-10-CM | POA: Diagnosis not present

## 2016-06-06 DIAGNOSIS — G629 Polyneuropathy, unspecified: Secondary | ICD-10-CM | POA: Diagnosis not present

## 2016-06-06 DIAGNOSIS — M160 Bilateral primary osteoarthritis of hip: Secondary | ICD-10-CM | POA: Diagnosis not present

## 2016-06-06 DIAGNOSIS — R791 Abnormal coagulation profile: Secondary | ICD-10-CM | POA: Diagnosis not present

## 2016-06-09 ENCOUNTER — Ambulatory Visit (HOSPITAL_COMMUNITY)
Admission: RE | Admit: 2016-06-09 | Discharge: 2016-06-09 | Disposition: A | Discharge status: Home / Self Care | Payer: Medicare Other | Source: Ambulatory Visit | Attending: Internal Medicine | Admitting: Internal Medicine

## 2016-06-09 ENCOUNTER — Other Ambulatory Visit (HOSPITAL_COMMUNITY): Payer: Self-pay | Admitting: Internal Medicine

## 2016-06-09 DIAGNOSIS — M4648 Discitis, unspecified, sacral and sacrococcygeal region: Secondary | ICD-10-CM

## 2016-06-09 DIAGNOSIS — Z452 Encounter for adjustment and management of vascular access device: Secondary | ICD-10-CM | POA: Diagnosis not present

## 2016-06-09 MED ORDER — LIDOCAINE HCL 1 % IJ SOLN
INTRAMUSCULAR | Status: AC
  Administered 2016-06-09: 5 mL
  Filled 2016-06-09: qty 20

## 2016-06-09 MED ORDER — HEPARIN SOD (PORK) LOCK FLUSH 100 UNIT/ML IV SOLN
INTRAVENOUS | Status: AC
  Administered 2016-06-09: 3 [IU]
  Filled 2016-06-09: qty 5

## 2016-06-09 NOTE — Procedures (Signed)
Interventional Radiology Procedure Note  Procedure:  PICC line  Complications: None  Estimated Blood Loss: < 10 mL  41 cm SL Power PICC via left basilic vein.  Tip at cavoatrial junction.  OK to use.  [name]. Kathlene Cote, M.D Pager:  [phone]

## 2016-06-10 NOTE — Progress Notes (Signed)
Patient had an episode of coughing after eating dinner, patient refused to take anything else by mouth including his medications. Patient educated on the importance of taking his meds but stated that he couldn't swallow right now.

## 2016-06-11 ENCOUNTER — Other Ambulatory Visit (HOSPITAL_COMMUNITY): Payer: Self-pay | Admitting: Internal Medicine

## 2016-06-11 ENCOUNTER — Other Ambulatory Visit (HOSPITAL_COMMUNITY): Payer: Medicare Other

## 2016-06-11 DIAGNOSIS — Z789 Other specified health status: Secondary | ICD-10-CM

## 2016-06-11 DIAGNOSIS — T8090XD Unspecified complication following infusion and therapeutic injection, subsequent encounter: Secondary | ICD-10-CM | POA: Diagnosis not present

## 2016-06-23 DIAGNOSIS — R1310 Dysphagia, unspecified: Secondary | ICD-10-CM | POA: Diagnosis not present

## 2016-06-23 DIAGNOSIS — R1010 Upper abdominal pain, unspecified: Secondary | ICD-10-CM | POA: Diagnosis not present

## 2016-06-26 ENCOUNTER — Encounter: Payer: Self-pay | Admitting: Internal Medicine

## 2016-06-26 DIAGNOSIS — R58 Hemorrhage, not elsewhere classified: Secondary | ICD-10-CM | POA: Diagnosis not present

## 2016-06-26 DIAGNOSIS — R860 Abnormal level of enzymes in specimens from male genital organs: Secondary | ICD-10-CM | POA: Diagnosis not present

## 2016-06-28 IMAGING — CT CT HIP*R* W/O CM
2 of 4 series · 7 of 46 positions shown, 8 images · non-contrast
Comparison: 03/16/2016

CLINICAL DATA: Fall today. Severe lower back pain and right hip
pain

EXAM:
CT OF THE RIGHT HIP WITHOUT CONTRAST
TECHNIQUE: Multidetector CT imaging of the right hip was performed according to
the standard protocol. Multiplanar CT image reconstructions were
also generated.

[Series 202: soft tissue · axial · 0.32mm/px · z∈[-325,-127]mm · 4 of 135 slices shown, 5 images]
[im 18/135  soft-tissue]
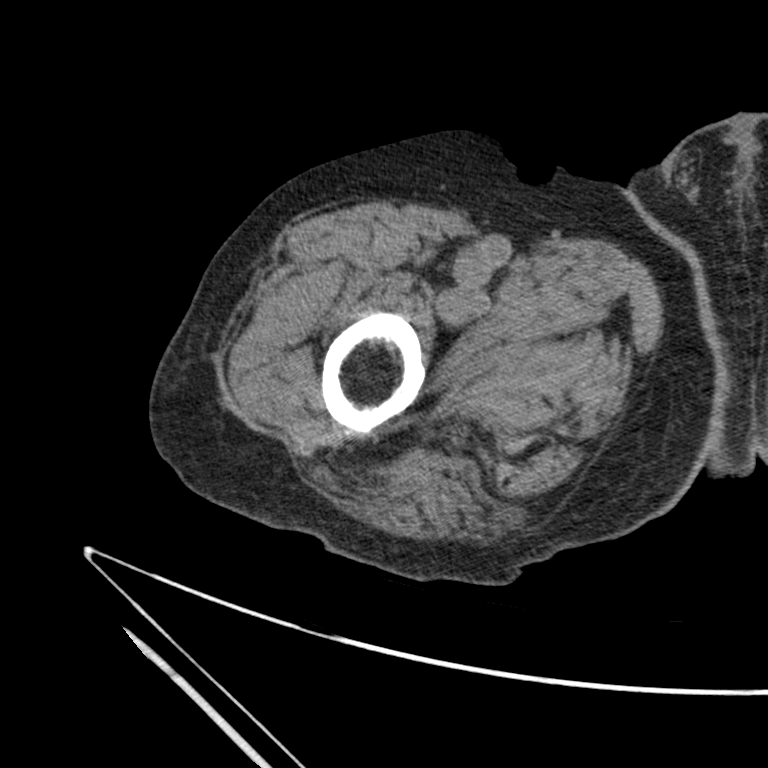
[im 18/135  bone]
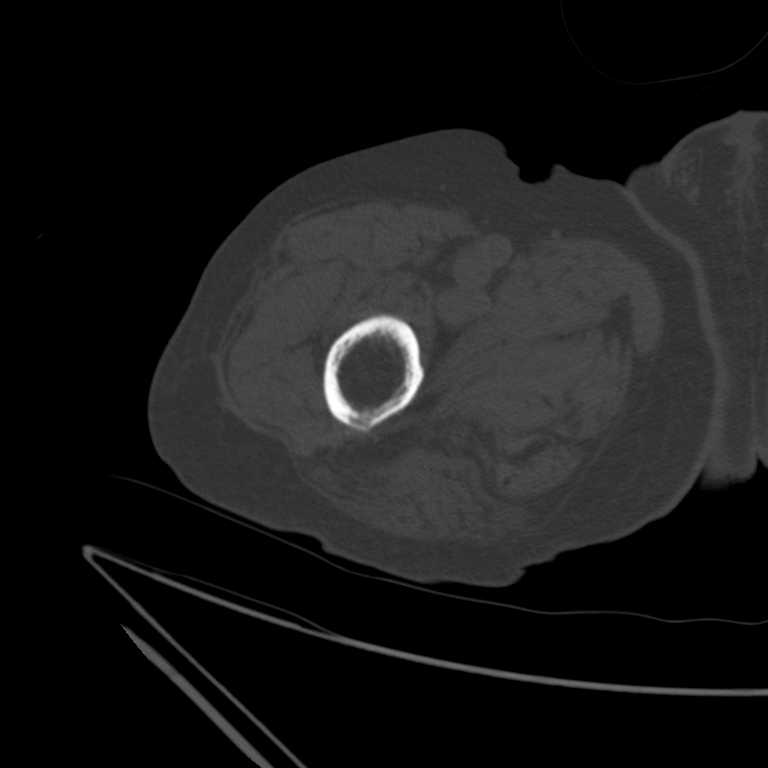
[im 52/135  soft-tissue]
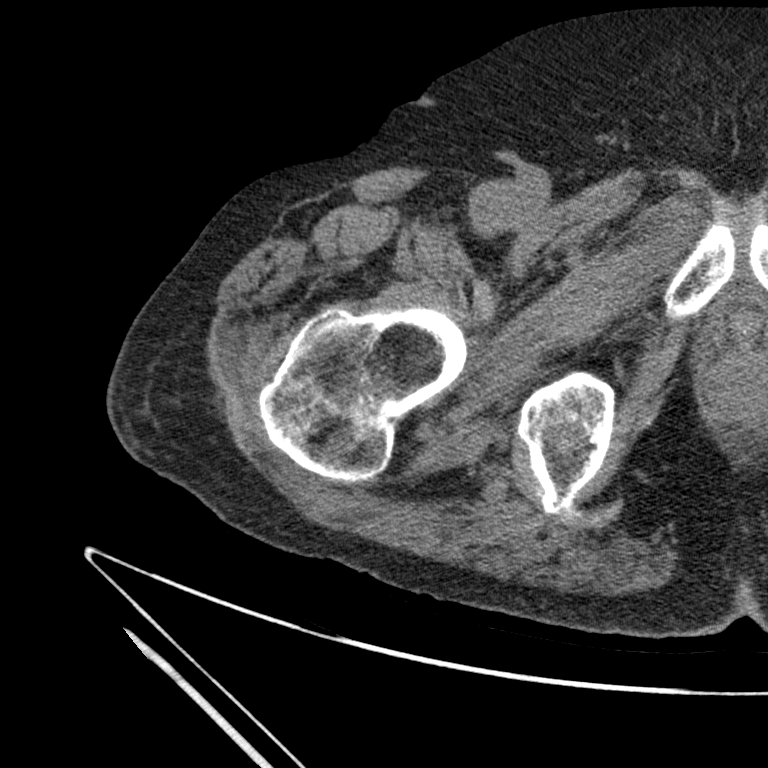
[im 83/135  soft-tissue]
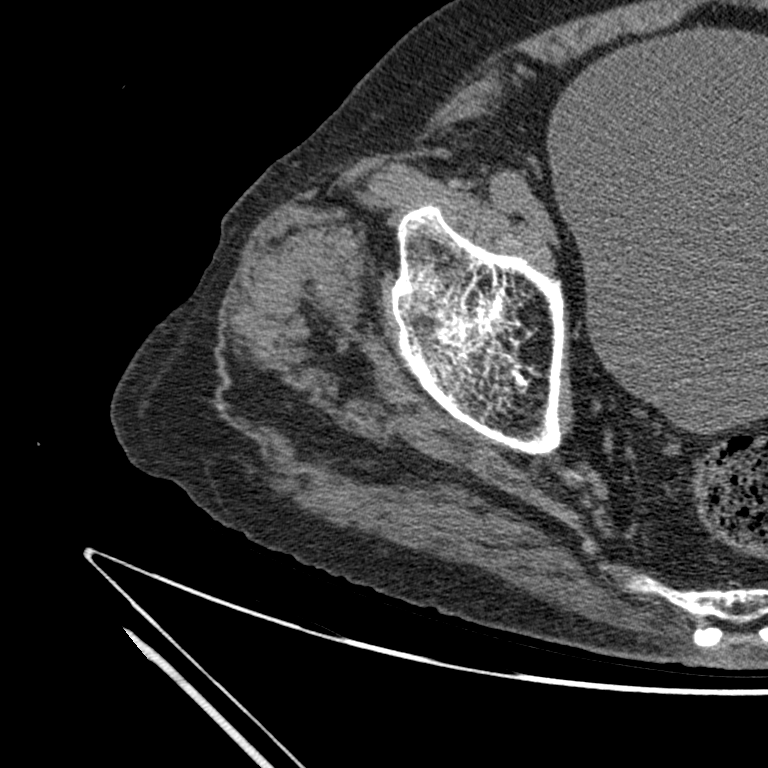
[im 117/135  soft-tissue]
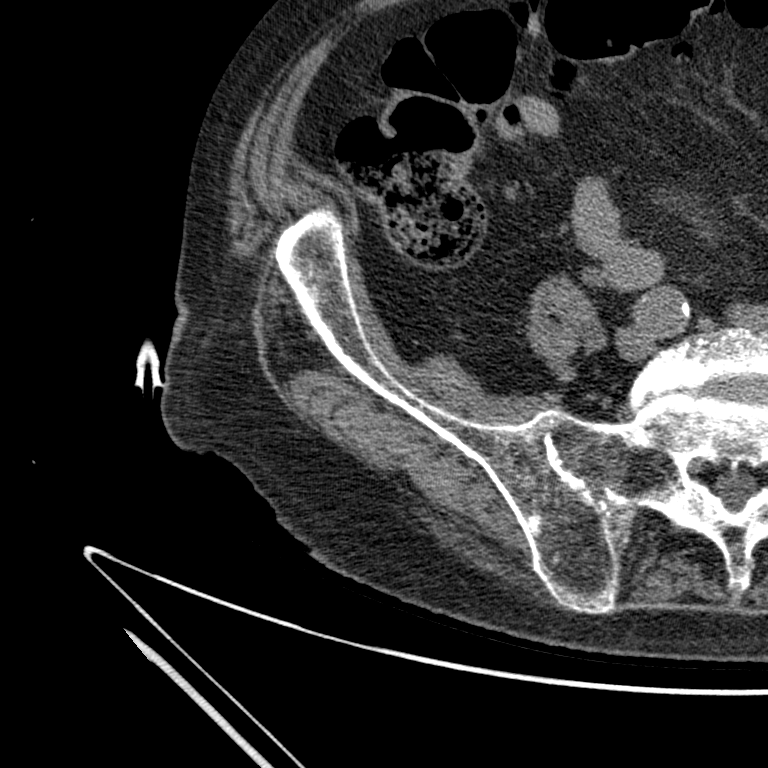

[Series 207: cor st · coronal · 0.33mm/px · 3 of 82 slices shown]
[im 21/82  soft-tissue]
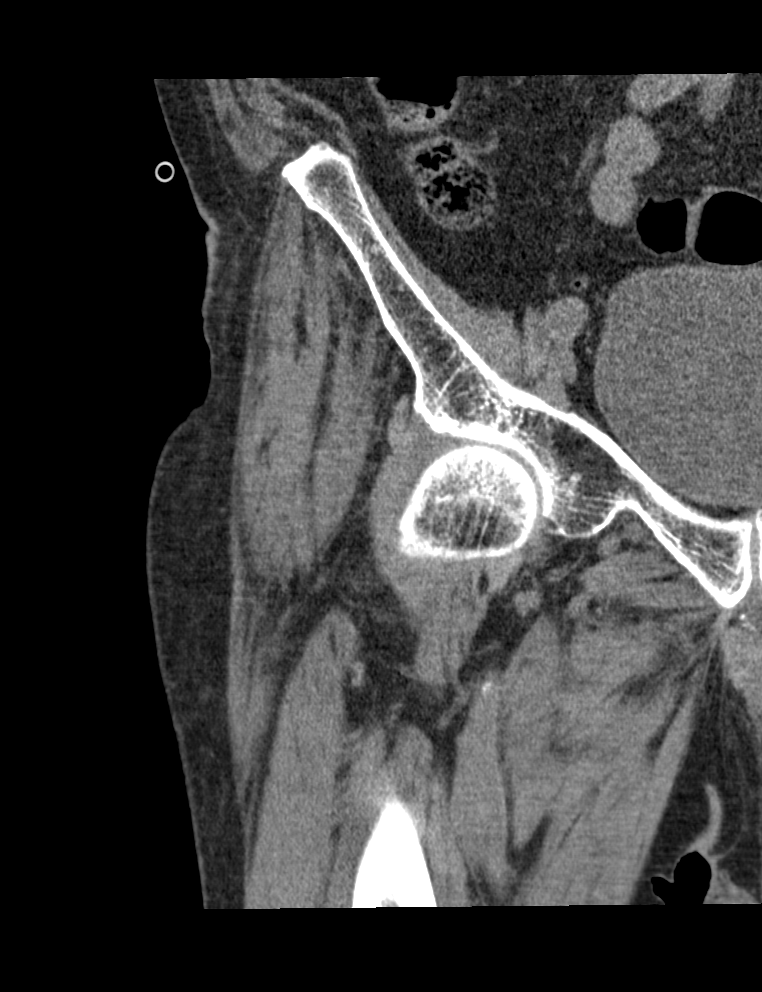
[im 41/82  soft-tissue]
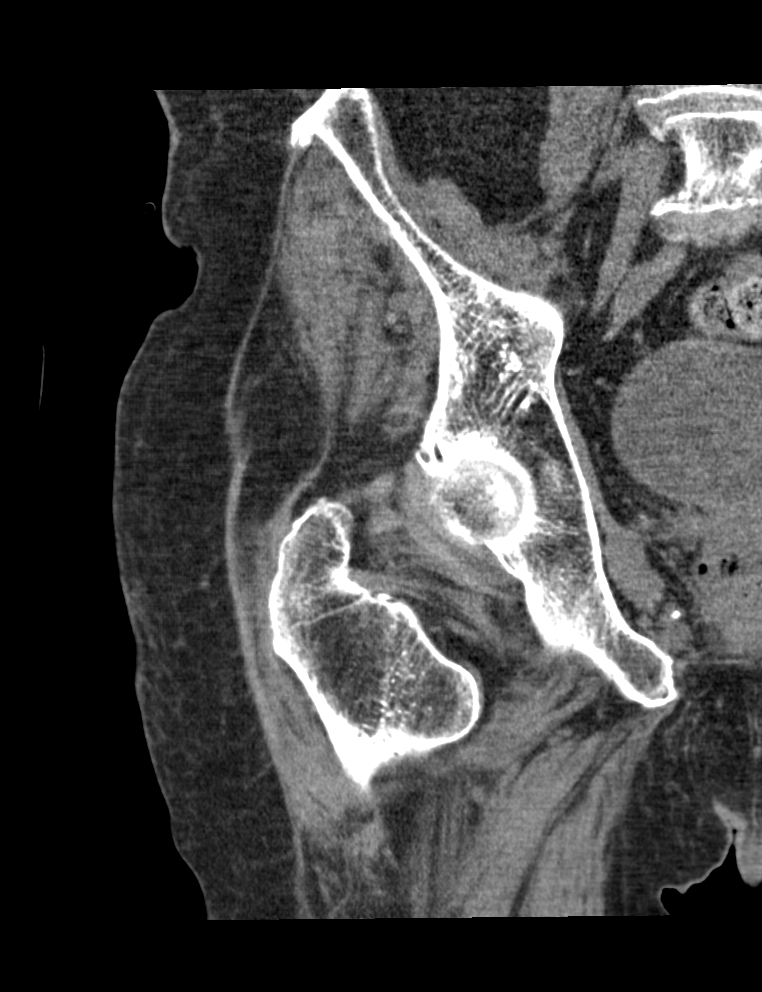
[im 61/82  soft-tissue]
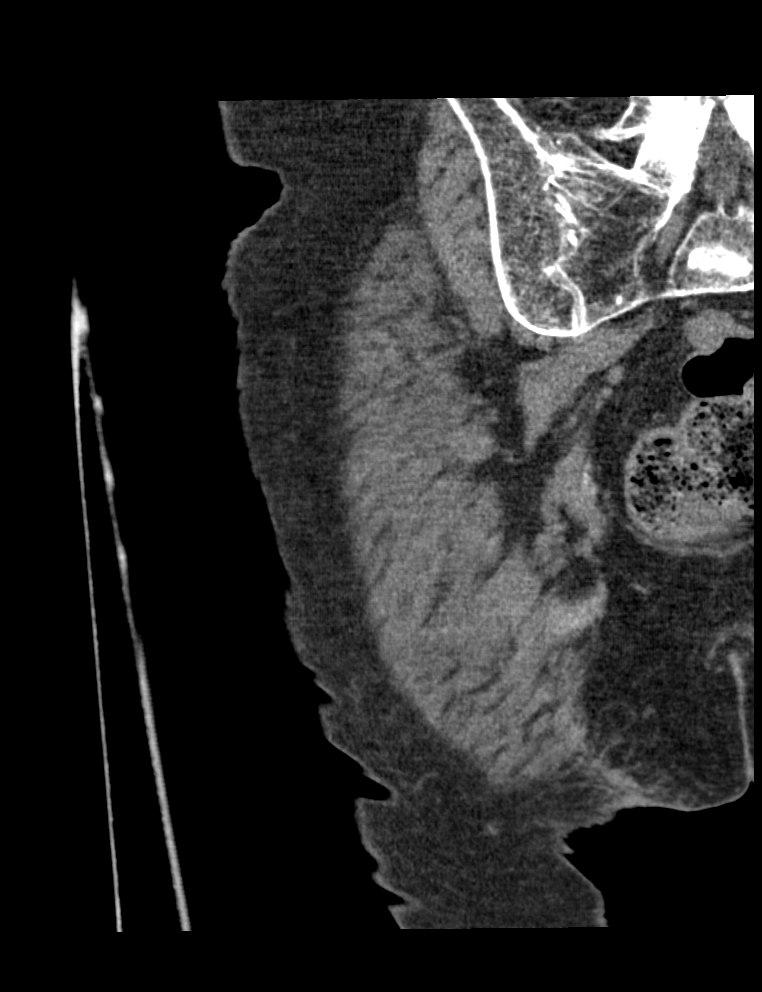

[7 of 46 positions shown; findings below may reference images not displayed]

FINDINGS: Negative for fracture no significant effusion. Minor spurring from
the femoral head and superior acetabulum. Right superior and
inferior ischiopubic rami intact. Fusion across the right SI joint.
Patchy iliac arterial calcifications without aneurysm. Urinary
bladder is distended.
IMPRESSION: 1. Negative.  No hip fracture.

## 2016-06-28 IMAGING — CT CT L SPINE W/O CM
3 of 5 series · 10 of 33 positions shown · non-contrast
Comparison: CT 10/26/2007

CLINICAL DATA: Pain post fall

EXAM:
CT LUMBAR SPINE WITHOUT CONTRAST
TECHNIQUE: Multidetector CT imaging of the lumbar spine was performed without
intravenous contrast administration. Multiplanar CT image
reconstructions were also generated.

[Series 304: coronal · coronal · 0.38mm/px · 1 of 56 slices shown]
[im 28/56  bone]
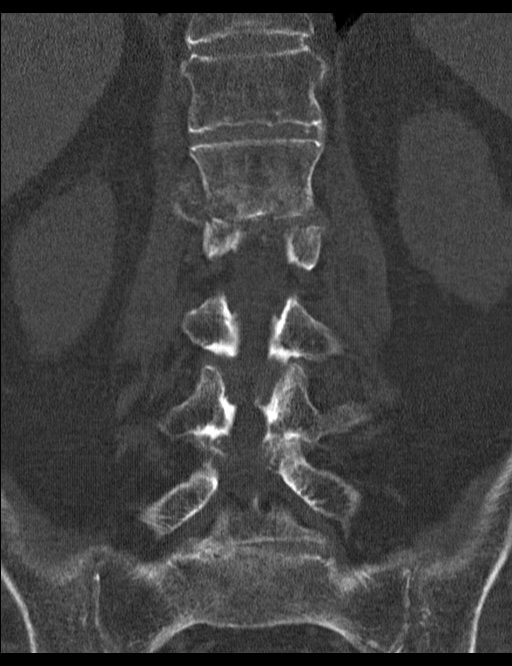

[Series 305: sagittal · sagittal · 0.38mm/px · 4 of 72 slices shown (1 of 2)]
[im 15/72  bone]
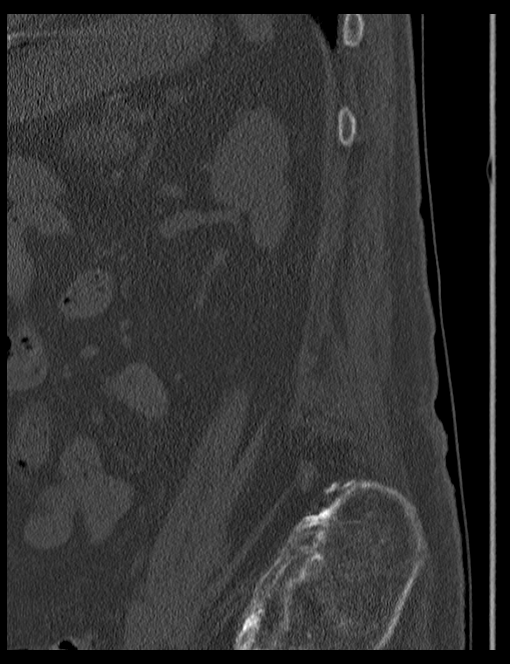
[im 29/72  bone]
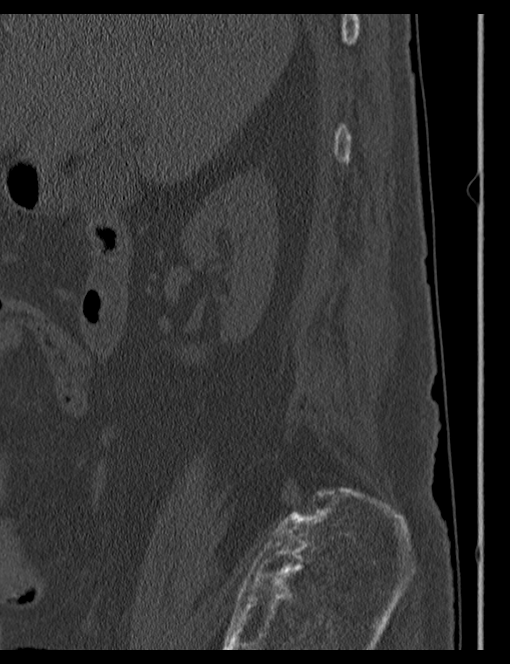
[im 43/72  bone]
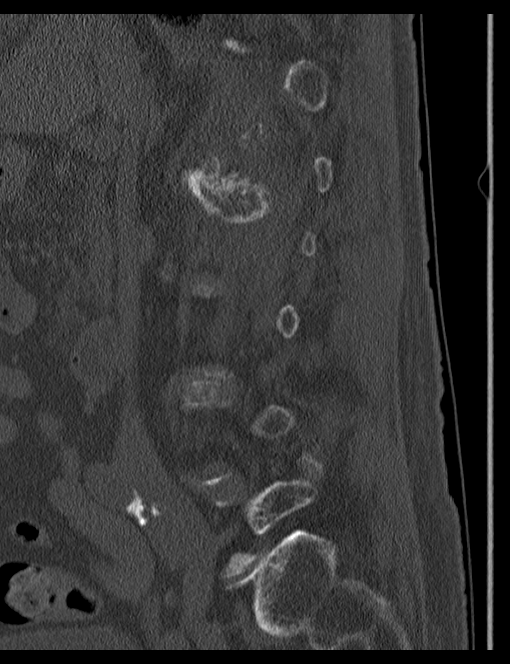
[im 57/72  bone]
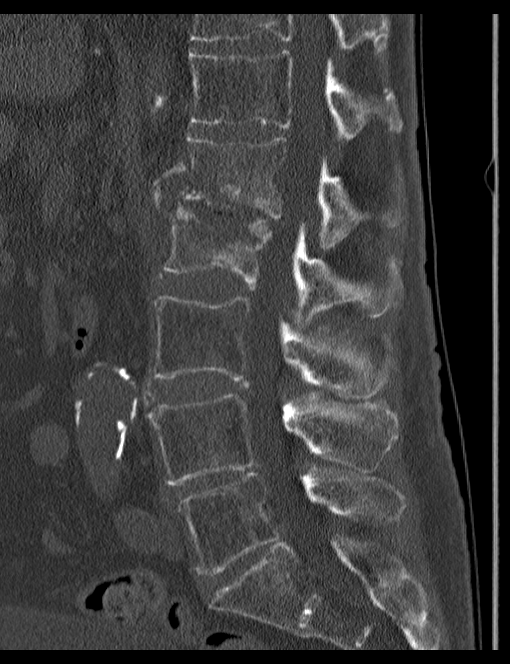

[Series 307: sagittal · sagittal · 0.38mm/px · 5 of 73 slices shown (2 of 2)]
[im 13/73  bone]
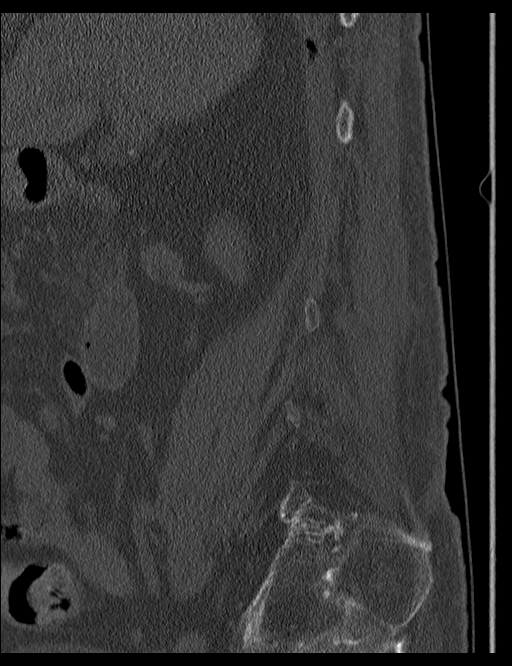
[im 25/73  bone]
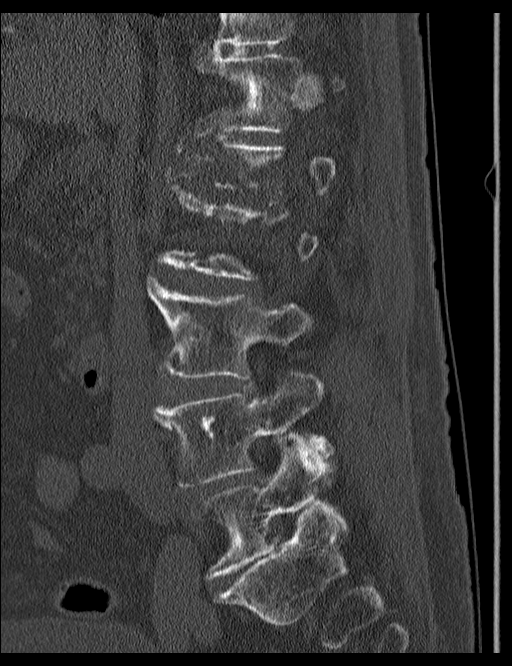
[im 37/73  bone]
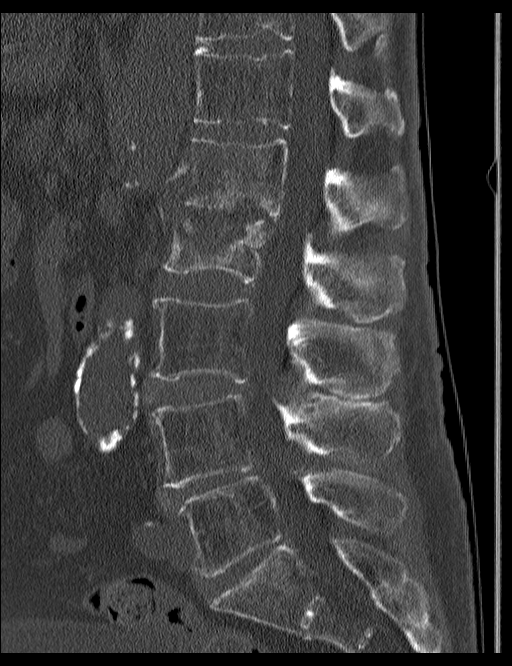
[im 49/73  bone]
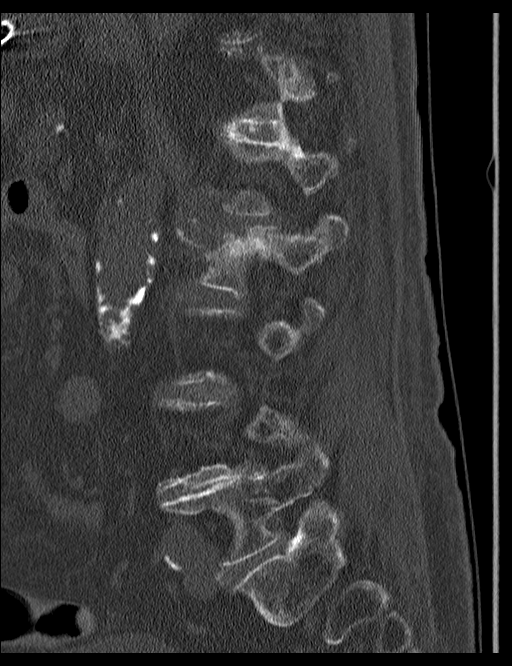
[im 61/73  bone]
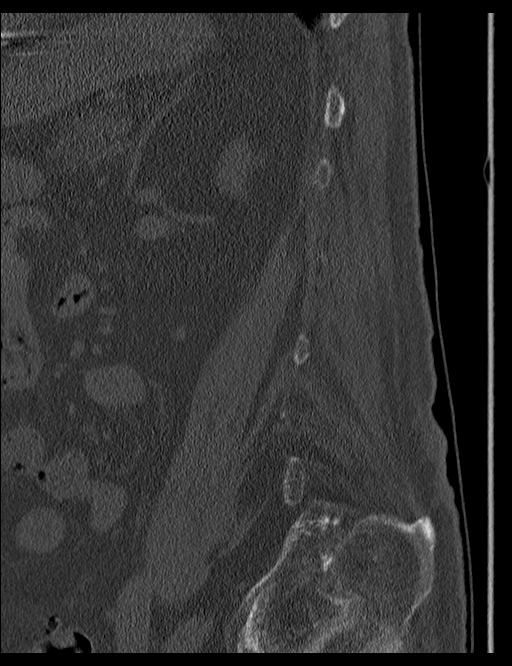

[10 of 33 positions shown; findings below may reference images not displayed]

FINDINGS: Five non rib bearing lobar is segments labeled L1-L5.

T11-12: Bridging anterior endplate osteophytes. Osteopenia. Central
canal and foramina patent.

T12-L1: Bridging anterior osteophytes. Central canal and foramina
patent.

L1-2: Compression fracture deformity involving inferior endplate of
L1 with a estimated 50% loss of height, more severe right than left.
Additionally, there is burst fracture of L2 with greater than 50%
loss of height anteriorly left greater than right. Fracture at L2
extends to involve the pedicles bilaterally, minimally displaced.
Approximately 4 mm retropulsion at the level of the superior
endplate of L2. There is a small amount of gas in the interspace
extending up into the fractured component of L1. No unexpected soft
tissue component.

L2-3:  Mild disc bulge.  Mild bilateral facet DJD.

L3-4:  Disc bulge.  Bilateral facet DJD.  Probable spinal stenosis.

L4-5: Disc narrowing. Disc bulge. Grade 1 anterolisthesis probably
secondary to the moderate facet DJD. Probable spinal stenosis.

L5-S1:  Asymmetric disc narrowing.  Mild bulge.  Facet DJD.

Fusion across bilateral sacroiliac joints. Patchy aortoiliac
arterial calcifications without aneurysm. Cardiac pacing leads
partially visualized.
IMPRESSION: 1. Compression fracture of L1 and burst fracture of L2, as detailed
above, probably posttraumatic. No other findings to suggest discitis
or metastatic involvement. Findings were reviewed with Dr. Rtoyota, who
concurs.
2. Spondylitic changes L2 to S1, with probable spinal stenosis L3-4
and L4-5.

## 2016-07-01 ENCOUNTER — Ambulatory Visit: Payer: Medicare Other | Admitting: Physician Assistant

## 2016-07-03 ENCOUNTER — Telehealth: Payer: Self-pay | Admitting: Pharmacist

## 2016-07-03 ENCOUNTER — Other Ambulatory Visit (HOSPITAL_COMMUNITY): Payer: Self-pay | Admitting: *Deleted

## 2016-07-03 ENCOUNTER — Ambulatory Visit (HOSPITAL_COMMUNITY)
Admission: RE | Admit: 2016-07-03 | Discharge: 2016-07-03 | Disposition: A | Discharge status: Home / Self Care | Payer: No Typology Code available for payment source | Source: Ambulatory Visit | Attending: Geriatric Medicine | Admitting: Geriatric Medicine

## 2016-07-03 DIAGNOSIS — D649 Anemia, unspecified: Secondary | ICD-10-CM | POA: Insufficient documentation

## 2016-07-03 LAB — ABO/RH: ABO/RH(D): O POS

## 2016-07-03 LAB — PREPARE RBC (CROSSMATCH)

## 2016-07-03 NOTE — Telephone Encounter (Signed)
Received labs for Mr. Gonsalez today.  He is being treated for Enterococcal bacteremia and discitis with continuous infusion Ampicillin - 8 gm over 24h. When he was discharged from hospital on 06/01/16, SCr was 1.09.  Labs today reveal SCr of 2.79 - CrCl now ~17 ml/min. [place] and gave RN new orders - ordered 1 liter normal saline infusion @ 75 ml/hr x 1, decreased ampicillin dose to 1 gm q12h, and repeated BMET tomorrow AM and fax to [facility] clinic [phone].   Cassie L. Nicole Kindred, PharmD Infectious Diseases Clinical Pharmacist Pager: [phone] 07/03/2016 10:28 AM

## 2016-07-04 ENCOUNTER — Encounter (HOSPITAL_COMMUNITY)
Admission: RE | Admit: 2016-07-04 | Discharge: 2016-07-04 | Disposition: A | Discharge status: Home / Self Care | Payer: No Typology Code available for payment source | Source: Ambulatory Visit | Attending: Geriatric Medicine | Admitting: Geriatric Medicine

## 2016-07-04 DIAGNOSIS — D649 Anemia, unspecified: Secondary | ICD-10-CM

## 2016-07-04 MED ORDER — HEPARIN SOD (PORK) LOCK FLUSH 100 UNIT/ML IV SOLN
INTRAVENOUS | Status: AC
  Administered 2016-07-04: 250 [IU]
  Filled 2016-07-04: qty 5

## 2016-07-04 MED ORDER — SODIUM CHLORIDE 0.9 % IV SOLN: Freq: Once | INTRAVENOUS | Status: DC

## 2016-07-05 LAB — TYPE AND SCREEN
ABO/RH(D): O POS
ANTIBODY SCREEN: NEGATIVE
Unit division: 0

## 2016-07-11 DIAGNOSIS — R509 Fever, unspecified: Secondary | ICD-10-CM | POA: Diagnosis not present

## 2016-07-11 DIAGNOSIS — R410 Disorientation, unspecified: Secondary | ICD-10-CM | POA: Diagnosis not present

## 2016-07-16 IMAGING — US IR FLUORO GUIDE CV LINE*L*
1 series · 1 of 1 positions shown · non-contrast
Comparison: none

CLINICAL DATA: Discitis of spine and need for PICC line for
long-term IV antibiotics. A pre-existing left arm PICC line has been
completely pulled out and the patient requires a new catheter.

[Series 1: ir fluoro/shunt/fist · 1 of 1 slices shown]
[im 1/1]
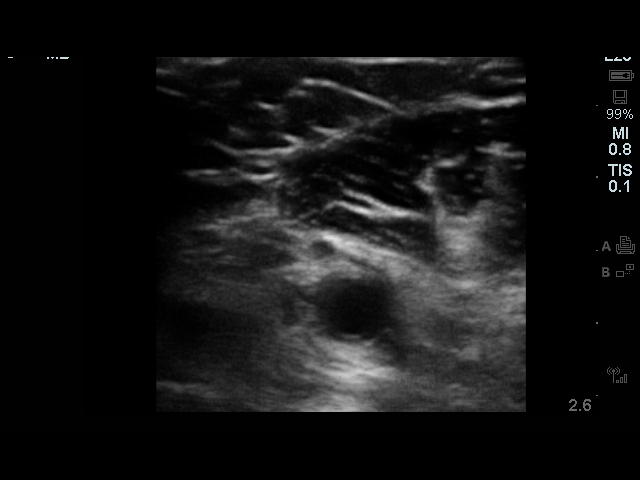

[1 of 1 positions shown; findings below may reference images not displayed]

EXAM:
POWER PICC LINE PLACEMENT WITH ULTRASOUND AND FLUOROSCOPIC GUIDANCE

FLUOROSCOPY TIME:  24 seconds

PROCEDURE:
The patient was advised of the possible risks and complications and
agreed to undergo the procedure. The patient was then brought to the
angiographic suite for the procedure. A time-out was performed prior
to the procedure.

The left arm was prepped with chlorhexidine, draped in the usual
sterile fashion using maximum barrier technique (cap and mask,
sterile gown, sterile gloves, large sterile sheet, hand hygiene and
cutaneous antisepsis) and infiltrated locally with 1% Lidocaine.

Ultrasound demonstrated patency of the left basilic vein, and this
was documented with an image. Under real-time ultrasound guidance,
this vein was accessed with a 21 gauge micropuncture needle and
image documentation was performed. A [DATE] wire was introduced in to
the vein. Over this, a 5.0 French single lumen power injectable PICC
was advanced to the lower SVC/right atrial junction. Fluoroscopy
during the procedure and fluoro spot radiograph confirms appropriate
catheter position. The catheter was flushed and covered with a
sterile dressing.

Catheter length: 41 cm

COMPLICATIONS:
None
IMPRESSION: Successful left arm power injectable PICC line placement with
ultrasound and fluoroscopic guidance. The catheter is ready for use.

## 2016-07-29 ENCOUNTER — Inpatient Hospital Stay: Payer: Medicare Other | Admitting: Internal Medicine

## 2016-07-31 ENCOUNTER — Encounter: Payer: Self-pay | Admitting: Internal Medicine

## 2016-07-31 ENCOUNTER — Ambulatory Visit (INDEPENDENT_AMBULATORY_CARE_PROVIDER_SITE_OTHER): Payer: Medicare Other | Admitting: Internal Medicine

## 2016-07-31 VITALS — BP 133/68 | HR 67 | Temp 97.6°F

## 2016-07-31 DIAGNOSIS — N183 Chronic kidney disease, stage 3 unspecified: Secondary | ICD-10-CM

## 2016-07-31 DIAGNOSIS — A491 Streptococcal infection, unspecified site: Secondary | ICD-10-CM

## 2016-07-31 DIAGNOSIS — B952 Enterococcus as the cause of diseases classified elsewhere: Secondary | ICD-10-CM | POA: Diagnosis not present

## 2016-07-31 DIAGNOSIS — M4646 Discitis, unspecified, lumbar region: Secondary | ICD-10-CM | POA: Diagnosis not present

## 2016-07-31 LAB — CBC WITH DIFFERENTIAL/PLATELET
BASOS PCT: 0 %
Basophils Absolute: 0 cells/uL (ref 0–200)
EOS ABS: 83 {cells}/uL (ref 15–500)
Eosinophils Relative: 1 %
HEMATOCRIT: 26.2 % — AB (ref 38.5–50.0)
HEMOGLOBIN: 8.6 g/dL — AB (ref 13.2–17.1)
LYMPHS ABS: 1577 {cells}/uL (ref 850–3900)
Lymphocytes Relative: 19 %
MCH: 30.2 pg (ref 27.0–33.0)
MCHC: 32.8 g/dL (ref 32.0–36.0)
MCV: 91.9 fL (ref 80.0–100.0)
MONO ABS: 581 {cells}/uL (ref 200–950)
MPV: 8.9 fL (ref 7.5–12.5)
Monocytes Relative: 7 %
NEUTROS ABS: 6059 {cells}/uL (ref 1500–7800)
Neutrophils Relative %: 73 %
Platelets: 337 10*3/uL (ref 140–400)
RBC: 2.85 MIL/uL — ABNORMAL LOW (ref 4.20–5.80)
RDW: 15.6 % — ABNORMAL HIGH (ref 11.0–15.0)
WBC: 8.3 10*3/uL (ref 3.8–10.8)

## 2016-07-31 LAB — C-REACTIVE PROTEIN: CRP: 6.8 mg/dL — ABNORMAL HIGH (ref <0.60)

## 2016-07-31 LAB — BASIC METABOLIC PANEL
BUN: 41 mg/dL — AB (ref 7–25)
CHLORIDE: 101 mmol/L (ref 98–110)
CO2: 23 mmol/L (ref 20–31)
Calcium: 8.7 mg/dL (ref 8.6–10.3)
Creat: 1.88 mg/dL — ABNORMAL HIGH (ref 0.70–1.11)
Glucose, Bld: 96 mg/dL (ref 65–99)
POTASSIUM: 4.1 mmol/L (ref 3.5–5.3)
SODIUM: 137 mmol/L (ref 135–146)

## 2016-07-31 NOTE — Progress Notes (Signed)
RFV: hospital follow up for enterococcal discitis  Patient ID: [name], male   DOB: 1924-06-05, 82 y.o.   MRN: [id number]  HPI [name] is a 82yo M who was admitted in June for enterococcal bacteremia with discitis. Tissue and blood cx were positive for amp S enterococcal species. He was treated with ampicillin through August 3rd for 6 wks. During his period of getting Iv abtx he developed acute kidney injury requiring dose adjustment of ampicilin. It is unclear if he has returned to his baseline. He did receive Iv fluid hydration that improved AKI slightly. He remains at [facility] nursing home  Imaging: compression fracture of l1 and burst fracture of l2, post traumatic but possibly also included discitis. Tissue aspirate on 6/26 + enterococcus  Outpatient Encounter Prescriptions as of 07/31/2016  Medication Sig  . acetaminophen (TYLENOL) 500 MG tablet Take 1 tablet (500 mg total) by mouth every 6 (six) hours as needed for mild pain, moderate pain, fever or headache.  Marland Kitchen amiodarone (PACERONE) 200 MG tablet Take 0.5 tablets (100 mg total) by mouth daily.  Marland Kitchen levothyroxine (SYNTHROID, LEVOTHROID) 25 MCG tablet Take 25 mcg by mouth daily before breakfast.  . Multiple Vitamin (MULTIVITAMIN) tablet Take 1 tablet by mouth daily.    Marland Kitchen oxyCODONE (OXY IR/ROXICODONE) 5 MG immediate release tablet Take 1 tablet (5 mg total) by mouth every 8 (eight) hours as needed for severe pain.  . polyethylene glycol (MIRALAX / GLYCOLAX) packet Take 17 g by mouth 2 (two) times daily.  Marland Kitchen senna (SENOKOT) 8.6 MG TABS tablet Take 2 tablets (17.2 mg total) by mouth daily.  . simvastatin (ZOCOR) 20 MG tablet Take 20 mg by mouth at bedtime.    Marland Kitchen warfarin (COUMADIN) 5 MG tablet Take 0.5-1 tablets (2.5-5 mg total) by mouth as directed. Take 2.5 mg (0.5 tablet) on Tues, Thurs, Sat and Sun and Take 5 mg (1 tablet) on Mon, Wed and Fri   No facility-administered encounter medications on file as of 07/31/2016.       Patient Active Problem List   Diagnosis Date Noted  . Fever   . Discitis of lumbar region   . Enterococcal bacteremia 05/24/2016  . Acute encephalopathy 05/24/2016  . Compression fracture of L2 lumbar vertebra with delayed healing 05/23/2016  . Elevated C-reactive protein (CRP) 05/22/2016  . Altered mental status 05/22/2016  . Right hip pain 03/17/2016  . Encounter for therapeutic drug monitoring 01/05/2014  . Gait difficulty 11/22/2013  . Chronic ischemic right PCA stroke 11/22/2013  . 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 PERIPHERAL NEUROPATHY 07/18/2010  . ABRASION/FRICION BURN OTH MX&UNS SITE W/O INF 09/25/2009  . PACEMAKER, PERMANENT 09/25/2009  . PREMATURE VENTRICULAR CONTRACTIONS 02/23/2009  . BRADYCARDIA 02/23/2009  . ATAXIA 02/23/2009  . ANXIETY 08/03/2008  . BPH (benign prostatic hyperplasia) 08/03/2008  . INSOMNIA-SLEEP DISORDER-UNSPEC 08/03/2008  . Hypothyroidism 10/26/2007  . PERIPHERAL VASCULAR DISEASE 10/26/2007  . ALLERGIC RHINITIS 10/26/2007  . HYPERTENSION, BENIGN ESSENTIAL 10/25/2007  . ATRIAL FIBRILLATION 08/26/2007  . Hyperlipidemia 08/23/2007  . SICK SINUS SYNDROME 08/23/2007  . CAROTID ARTERY STENOSIS 08/23/2007     Health Maintenance Due  Topic Date Due  . ZOSTAVAX  10/24/1986  . PNA vac Low Risk Adult (1 of 2 - PCV13) 10/25/1991  . TETANUS/TDAP  03/22/2007  . INFLUENZA VACCINE  07/01/2016     Review of Systems Still feeling weak. Learning to work with PT/OT Physical Exam  BP 133/68   Pulse 67   Temp 97.6  F (36.4 C) (Oral)  Physical Exam  Constitutional: He is oriented to person, place, and time. He appears well-developed and well-nourished. No distress. Appears his stated age HENT:  Mouth/Throat: Oropharynx is clear and moist. No oropharyngeal exudate.  Cardiovascular: Normal rate, regular rhythm and normal heart sounds. Exam reveals no gallop and no friction rub.  No murmur heard.  Pulmonary/Chest: Effort normal  and breath sounds normal. No respiratory distress. He has no wheezes.  Wearing brace Lymphadenopathy:  He has no cervical adenopathy.  Skin: Skin is warm and dry. No rash noted. No erythema.  Psychiatric: He has a normal mood and affect. His behavior is normal.     CBC Lab Results  Component Value Date   WBC 6.5 06/01/2016   RBC 2.97 (L) 06/01/2016   HGB 8.8 (L) 06/01/2016   HCT 28.0 (L) 06/01/2016   PLT 359 06/01/2016   MCV 94.3 06/01/2016   MCH 29.6 06/01/2016   MCHC 31.4 06/01/2016   RDW 14.4 06/01/2016   LYMPHSABS 2.8 05/29/2016   MONOABS 1.2 (H) 05/29/2016   EOSABS 0.0 05/29/2016   BASOSABS 0.0 05/29/2016   BMET Lab Results  Component Value Date   NA 135 06/01/2016   K 3.9 06/01/2016   CL 101 06/01/2016   CO2 27 06/01/2016   GLUCOSE 107 (H) 06/01/2016   BUN 9 06/01/2016   CREATININE 1.09 06/01/2016   CALCIUM 8.4 (L) 06/01/2016   GFRNONAA 58 (L) 06/01/2016   GFRAA >60 06/01/2016   Lab Results  Component Value Date   ESRSEDRATE 100 (H) 07/31/2016   Lab Results  Component Value Date   CRP 6.8 (H) 07/31/2016     Assessment and Plan  Lumbar discitis = Will do labs, to check sed rate and crp, which are still elevated, improved from hospitalization. Will place him on amox 500mg  TID for enterococcal infection. Will see if can get plain xray   AKI on CKD = recommend to get ultrasound of kidneys to evaluated kidney disease. Asked the NH to do urine studies  Recommended that they follow up with pcp

## 2016-08-01 LAB — SEDIMENTATION RATE: SED RATE: 100 mm/h — AB (ref 0–20)

## 2016-08-01 MED ORDER — AMOXICILLIN 500 MG PO CAPS: 500.0000 mg | ORAL_CAPSULE | Freq: Three times a day (TID) | ORAL | 2 refills | Status: DC

## 2016-08-05 ENCOUNTER — Telehealth: Payer: Self-pay | Admitting: *Deleted

## 2016-08-05 NOTE — Telephone Encounter (Signed)
-----   Message from Carlyle Basques, MD sent at 08/01/2016  3:52 PM EDT ----- I can't remember if I was working with you on this guy. Can you call [facility]. He needs to be on amox 500mg  TID for the next 2 months. Can you make appt for me to see him in oct. He will also need lumbar spine xray if they can arrange that within next 2 wk

## 2016-08-05 NOTE — Telephone Encounter (Signed)
Verbal order per Dr. Baxter Flattery for lumbar xray by 9/19; amoxicillin 500mg  three times daily by mouth x 60 days. Patient scheduled for follow up 10/12 at 9:15.  Verbal order given to [place], Therapist, sports at [place] repeated and verified.   Landis Gandy, RN

## 2016-08-28 DIAGNOSIS — I872 Venous insufficiency (chronic) (peripheral): Secondary | ICD-10-CM | POA: Diagnosis not present

## 2016-09-04 DIAGNOSIS — I872 Venous insufficiency (chronic) (peripheral): Secondary | ICD-10-CM | POA: Diagnosis not present

## 2016-09-11 ENCOUNTER — Ambulatory Visit: Payer: Medicare Other | Admitting: Internal Medicine

## 2016-09-15 DIAGNOSIS — I10 Essential (primary) hypertension: Secondary | ICD-10-CM | POA: Diagnosis not present

## 2016-09-15 DIAGNOSIS — E038 Other specified hypothyroidism: Secondary | ICD-10-CM | POA: Diagnosis not present

## 2016-09-15 DIAGNOSIS — M4647 Discitis, unspecified, lumbosacral region: Secondary | ICD-10-CM | POA: Diagnosis not present

## 2016-09-16 DIAGNOSIS — G8929 Other chronic pain: Secondary | ICD-10-CM | POA: Diagnosis not present

## 2016-09-16 DIAGNOSIS — S32021D Stable burst fracture of second lumbar vertebra, subsequent encounter for fracture with routine healing: Secondary | ICD-10-CM | POA: Diagnosis not present

## 2016-09-16 DIAGNOSIS — J449 Chronic obstructive pulmonary disease, unspecified: Secondary | ICD-10-CM | POA: Diagnosis not present

## 2016-09-16 DIAGNOSIS — Z7901 Long term (current) use of anticoagulants: Secondary | ICD-10-CM | POA: Diagnosis not present

## 2016-09-16 DIAGNOSIS — I480 Paroxysmal atrial fibrillation: Secondary | ICD-10-CM | POA: Diagnosis not present

## 2016-09-16 DIAGNOSIS — Z7409 Other reduced mobility: Secondary | ICD-10-CM | POA: Diagnosis not present

## 2016-09-16 DIAGNOSIS — M160 Bilateral primary osteoarthritis of hip: Secondary | ICD-10-CM | POA: Diagnosis not present

## 2016-09-16 DIAGNOSIS — S80811D Abrasion, right lower leg, subsequent encounter: Secondary | ICD-10-CM | POA: Diagnosis not present

## 2016-09-16 DIAGNOSIS — Z950 Presence of cardiac pacemaker: Secondary | ICD-10-CM | POA: Diagnosis not present

## 2016-09-16 DIAGNOSIS — R1310 Dysphagia, unspecified: Secondary | ICD-10-CM | POA: Diagnosis not present

## 2016-09-16 DIAGNOSIS — G629 Polyneuropathy, unspecified: Secondary | ICD-10-CM | POA: Diagnosis not present

## 2016-09-16 DIAGNOSIS — Z87891 Personal history of nicotine dependence: Secondary | ICD-10-CM | POA: Diagnosis not present

## 2016-09-16 DIAGNOSIS — S32010D Wedge compression fracture of first lumbar vertebra, subsequent encounter for fracture with routine healing: Secondary | ICD-10-CM | POA: Diagnosis not present

## 2016-09-16 DIAGNOSIS — I69391 Dysphagia following cerebral infarction: Secondary | ICD-10-CM | POA: Diagnosis not present

## 2016-09-16 DIAGNOSIS — M353 Polymyalgia rheumatica: Secondary | ICD-10-CM | POA: Diagnosis not present

## 2016-09-17 DIAGNOSIS — S32010D Wedge compression fracture of first lumbar vertebra, subsequent encounter for fracture with routine healing: Secondary | ICD-10-CM | POA: Diagnosis not present

## 2016-09-17 DIAGNOSIS — S32021D Stable burst fracture of second lumbar vertebra, subsequent encounter for fracture with routine healing: Secondary | ICD-10-CM | POA: Diagnosis not present

## 2016-09-17 DIAGNOSIS — S80811D Abrasion, right lower leg, subsequent encounter: Secondary | ICD-10-CM | POA: Diagnosis not present

## 2016-09-17 DIAGNOSIS — I69391 Dysphagia following cerebral infarction: Secondary | ICD-10-CM | POA: Diagnosis not present

## 2016-09-17 DIAGNOSIS — G8929 Other chronic pain: Secondary | ICD-10-CM | POA: Diagnosis not present

## 2016-09-17 DIAGNOSIS — R1310 Dysphagia, unspecified: Secondary | ICD-10-CM | POA: Diagnosis not present

## 2016-09-18 DIAGNOSIS — S32021D Stable burst fracture of second lumbar vertebra, subsequent encounter for fracture with routine healing: Secondary | ICD-10-CM | POA: Diagnosis not present

## 2016-09-18 DIAGNOSIS — G8929 Other chronic pain: Secondary | ICD-10-CM | POA: Diagnosis not present

## 2016-09-18 DIAGNOSIS — I69391 Dysphagia following cerebral infarction: Secondary | ICD-10-CM | POA: Diagnosis not present

## 2016-09-18 DIAGNOSIS — S80811D Abrasion, right lower leg, subsequent encounter: Secondary | ICD-10-CM | POA: Diagnosis not present

## 2016-09-18 DIAGNOSIS — R1310 Dysphagia, unspecified: Secondary | ICD-10-CM | POA: Diagnosis not present

## 2016-09-18 DIAGNOSIS — S32010D Wedge compression fracture of first lumbar vertebra, subsequent encounter for fracture with routine healing: Secondary | ICD-10-CM | POA: Diagnosis not present

## 2016-09-22 DIAGNOSIS — G8929 Other chronic pain: Secondary | ICD-10-CM | POA: Diagnosis not present

## 2016-09-22 DIAGNOSIS — S80811D Abrasion, right lower leg, subsequent encounter: Secondary | ICD-10-CM | POA: Diagnosis not present

## 2016-09-22 DIAGNOSIS — R1310 Dysphagia, unspecified: Secondary | ICD-10-CM | POA: Diagnosis not present

## 2016-09-22 DIAGNOSIS — S32010D Wedge compression fracture of first lumbar vertebra, subsequent encounter for fracture with routine healing: Secondary | ICD-10-CM | POA: Diagnosis not present

## 2016-09-22 DIAGNOSIS — S32021D Stable burst fracture of second lumbar vertebra, subsequent encounter for fracture with routine healing: Secondary | ICD-10-CM | POA: Diagnosis not present

## 2016-09-22 DIAGNOSIS — I69391 Dysphagia following cerebral infarction: Secondary | ICD-10-CM | POA: Diagnosis not present

## 2016-09-23 ENCOUNTER — Ambulatory Visit (INDEPENDENT_AMBULATORY_CARE_PROVIDER_SITE_OTHER): Payer: Medicare Other | Admitting: Cardiovascular Disease

## 2016-09-23 DIAGNOSIS — I69391 Dysphagia following cerebral infarction: Secondary | ICD-10-CM | POA: Diagnosis not present

## 2016-09-23 DIAGNOSIS — Z5181 Encounter for therapeutic drug level monitoring: Secondary | ICD-10-CM

## 2016-09-23 DIAGNOSIS — I482 Chronic atrial fibrillation, unspecified: Secondary | ICD-10-CM

## 2016-09-23 DIAGNOSIS — R1310 Dysphagia, unspecified: Secondary | ICD-10-CM | POA: Diagnosis not present

## 2016-09-23 DIAGNOSIS — S80811D Abrasion, right lower leg, subsequent encounter: Secondary | ICD-10-CM | POA: Diagnosis not present

## 2016-09-23 DIAGNOSIS — S32010D Wedge compression fracture of first lumbar vertebra, subsequent encounter for fracture with routine healing: Secondary | ICD-10-CM | POA: Diagnosis not present

## 2016-09-23 DIAGNOSIS — S32021D Stable burst fracture of second lumbar vertebra, subsequent encounter for fracture with routine healing: Secondary | ICD-10-CM | POA: Diagnosis not present

## 2016-09-23 DIAGNOSIS — G8929 Other chronic pain: Secondary | ICD-10-CM | POA: Diagnosis not present

## 2016-09-23 LAB — POCT INR: INR: 3

## 2016-09-24 DIAGNOSIS — G8929 Other chronic pain: Secondary | ICD-10-CM | POA: Diagnosis not present

## 2016-09-24 DIAGNOSIS — R1310 Dysphagia, unspecified: Secondary | ICD-10-CM | POA: Diagnosis not present

## 2016-09-24 DIAGNOSIS — S32010D Wedge compression fracture of first lumbar vertebra, subsequent encounter for fracture with routine healing: Secondary | ICD-10-CM | POA: Diagnosis not present

## 2016-09-24 DIAGNOSIS — S80811D Abrasion, right lower leg, subsequent encounter: Secondary | ICD-10-CM | POA: Diagnosis not present

## 2016-09-24 DIAGNOSIS — I69391 Dysphagia following cerebral infarction: Secondary | ICD-10-CM | POA: Diagnosis not present

## 2016-09-24 DIAGNOSIS — S32021D Stable burst fracture of second lumbar vertebra, subsequent encounter for fracture with routine healing: Secondary | ICD-10-CM | POA: Diagnosis not present

## 2016-09-25 DIAGNOSIS — R1310 Dysphagia, unspecified: Secondary | ICD-10-CM | POA: Diagnosis not present

## 2016-09-25 DIAGNOSIS — I69391 Dysphagia following cerebral infarction: Secondary | ICD-10-CM | POA: Diagnosis not present

## 2016-09-25 DIAGNOSIS — S32010D Wedge compression fracture of first lumbar vertebra, subsequent encounter for fracture with routine healing: Secondary | ICD-10-CM | POA: Diagnosis not present

## 2016-09-25 DIAGNOSIS — S32021D Stable burst fracture of second lumbar vertebra, subsequent encounter for fracture with routine healing: Secondary | ICD-10-CM | POA: Diagnosis not present

## 2016-09-25 DIAGNOSIS — G8929 Other chronic pain: Secondary | ICD-10-CM | POA: Diagnosis not present

## 2016-09-25 DIAGNOSIS — S80811D Abrasion, right lower leg, subsequent encounter: Secondary | ICD-10-CM | POA: Diagnosis not present

## 2016-09-26 DIAGNOSIS — S32010D Wedge compression fracture of first lumbar vertebra, subsequent encounter for fracture with routine healing: Secondary | ICD-10-CM | POA: Diagnosis not present

## 2016-09-26 DIAGNOSIS — R1310 Dysphagia, unspecified: Secondary | ICD-10-CM | POA: Diagnosis not present

## 2016-09-26 DIAGNOSIS — G8929 Other chronic pain: Secondary | ICD-10-CM | POA: Diagnosis not present

## 2016-09-26 DIAGNOSIS — I69391 Dysphagia following cerebral infarction: Secondary | ICD-10-CM | POA: Diagnosis not present

## 2016-09-26 DIAGNOSIS — S32021D Stable burst fracture of second lumbar vertebra, subsequent encounter for fracture with routine healing: Secondary | ICD-10-CM | POA: Diagnosis not present

## 2016-09-26 DIAGNOSIS — S80811D Abrasion, right lower leg, subsequent encounter: Secondary | ICD-10-CM | POA: Diagnosis not present

## 2016-09-29 DIAGNOSIS — S32021D Stable burst fracture of second lumbar vertebra, subsequent encounter for fracture with routine healing: Secondary | ICD-10-CM | POA: Diagnosis not present

## 2016-09-29 DIAGNOSIS — S32010D Wedge compression fracture of first lumbar vertebra, subsequent encounter for fracture with routine healing: Secondary | ICD-10-CM | POA: Diagnosis not present

## 2016-09-29 DIAGNOSIS — G8929 Other chronic pain: Secondary | ICD-10-CM | POA: Diagnosis not present

## 2016-09-29 DIAGNOSIS — I69391 Dysphagia following cerebral infarction: Secondary | ICD-10-CM | POA: Diagnosis not present

## 2016-09-29 DIAGNOSIS — S80811D Abrasion, right lower leg, subsequent encounter: Secondary | ICD-10-CM | POA: Diagnosis not present

## 2016-09-29 DIAGNOSIS — R1310 Dysphagia, unspecified: Secondary | ICD-10-CM | POA: Diagnosis not present

## 2016-09-30 DIAGNOSIS — S32021D Stable burst fracture of second lumbar vertebra, subsequent encounter for fracture with routine healing: Secondary | ICD-10-CM | POA: Diagnosis not present

## 2016-09-30 DIAGNOSIS — S80811D Abrasion, right lower leg, subsequent encounter: Secondary | ICD-10-CM | POA: Diagnosis not present

## 2016-09-30 DIAGNOSIS — R1310 Dysphagia, unspecified: Secondary | ICD-10-CM | POA: Diagnosis not present

## 2016-09-30 DIAGNOSIS — I69391 Dysphagia following cerebral infarction: Secondary | ICD-10-CM | POA: Diagnosis not present

## 2016-09-30 DIAGNOSIS — G8929 Other chronic pain: Secondary | ICD-10-CM | POA: Diagnosis not present

## 2016-09-30 DIAGNOSIS — S32010D Wedge compression fracture of first lumbar vertebra, subsequent encounter for fracture with routine healing: Secondary | ICD-10-CM | POA: Diagnosis not present

## 2016-09-30 LAB — POCT INR: INR: 3.3

## 2016-10-01 ENCOUNTER — Ambulatory Visit (INDEPENDENT_AMBULATORY_CARE_PROVIDER_SITE_OTHER): Payer: Medicare Other | Admitting: Cardiology

## 2016-10-01 DIAGNOSIS — G8929 Other chronic pain: Secondary | ICD-10-CM | POA: Diagnosis not present

## 2016-10-01 DIAGNOSIS — I69391 Dysphagia following cerebral infarction: Secondary | ICD-10-CM | POA: Diagnosis not present

## 2016-10-01 DIAGNOSIS — I482 Chronic atrial fibrillation, unspecified: Secondary | ICD-10-CM

## 2016-10-01 DIAGNOSIS — S80811D Abrasion, right lower leg, subsequent encounter: Secondary | ICD-10-CM | POA: Diagnosis not present

## 2016-10-01 DIAGNOSIS — R1310 Dysphagia, unspecified: Secondary | ICD-10-CM | POA: Diagnosis not present

## 2016-10-01 DIAGNOSIS — S32010D Wedge compression fracture of first lumbar vertebra, subsequent encounter for fracture with routine healing: Secondary | ICD-10-CM | POA: Diagnosis not present

## 2016-10-01 DIAGNOSIS — Z5181 Encounter for therapeutic drug level monitoring: Secondary | ICD-10-CM

## 2016-10-01 DIAGNOSIS — S32021D Stable burst fracture of second lumbar vertebra, subsequent encounter for fracture with routine healing: Secondary | ICD-10-CM | POA: Diagnosis not present

## 2016-10-02 DIAGNOSIS — S80811D Abrasion, right lower leg, subsequent encounter: Secondary | ICD-10-CM | POA: Diagnosis not present

## 2016-10-02 DIAGNOSIS — I69391 Dysphagia following cerebral infarction: Secondary | ICD-10-CM | POA: Diagnosis not present

## 2016-10-02 DIAGNOSIS — S32021D Stable burst fracture of second lumbar vertebra, subsequent encounter for fracture with routine healing: Secondary | ICD-10-CM | POA: Diagnosis not present

## 2016-10-02 DIAGNOSIS — G8929 Other chronic pain: Secondary | ICD-10-CM | POA: Diagnosis not present

## 2016-10-02 DIAGNOSIS — S32010D Wedge compression fracture of first lumbar vertebra, subsequent encounter for fracture with routine healing: Secondary | ICD-10-CM | POA: Diagnosis not present

## 2016-10-02 DIAGNOSIS — R1310 Dysphagia, unspecified: Secondary | ICD-10-CM | POA: Diagnosis not present

## 2016-10-06 DIAGNOSIS — S32010D Wedge compression fracture of first lumbar vertebra, subsequent encounter for fracture with routine healing: Secondary | ICD-10-CM | POA: Diagnosis not present

## 2016-10-06 DIAGNOSIS — G8929 Other chronic pain: Secondary | ICD-10-CM | POA: Diagnosis not present

## 2016-10-06 DIAGNOSIS — S80811D Abrasion, right lower leg, subsequent encounter: Secondary | ICD-10-CM | POA: Diagnosis not present

## 2016-10-06 DIAGNOSIS — S32021D Stable burst fracture of second lumbar vertebra, subsequent encounter for fracture with routine healing: Secondary | ICD-10-CM | POA: Diagnosis not present

## 2016-10-06 DIAGNOSIS — R1310 Dysphagia, unspecified: Secondary | ICD-10-CM | POA: Diagnosis not present

## 2016-10-06 DIAGNOSIS — I69391 Dysphagia following cerebral infarction: Secondary | ICD-10-CM | POA: Diagnosis not present

## 2016-10-07 ENCOUNTER — Ambulatory Visit (INDEPENDENT_AMBULATORY_CARE_PROVIDER_SITE_OTHER): Payer: Medicare Other | Admitting: Internal Medicine

## 2016-10-07 DIAGNOSIS — Z5181 Encounter for therapeutic drug level monitoring: Secondary | ICD-10-CM

## 2016-10-07 DIAGNOSIS — I482 Chronic atrial fibrillation, unspecified: Secondary | ICD-10-CM

## 2016-10-07 DIAGNOSIS — G8929 Other chronic pain: Secondary | ICD-10-CM | POA: Diagnosis not present

## 2016-10-07 DIAGNOSIS — S32010D Wedge compression fracture of first lumbar vertebra, subsequent encounter for fracture with routine healing: Secondary | ICD-10-CM | POA: Diagnosis not present

## 2016-10-07 DIAGNOSIS — S32021D Stable burst fracture of second lumbar vertebra, subsequent encounter for fracture with routine healing: Secondary | ICD-10-CM | POA: Diagnosis not present

## 2016-10-07 DIAGNOSIS — I69391 Dysphagia following cerebral infarction: Secondary | ICD-10-CM | POA: Diagnosis not present

## 2016-10-07 DIAGNOSIS — R1310 Dysphagia, unspecified: Secondary | ICD-10-CM | POA: Diagnosis not present

## 2016-10-07 DIAGNOSIS — S80811D Abrasion, right lower leg, subsequent encounter: Secondary | ICD-10-CM | POA: Diagnosis not present

## 2016-10-07 LAB — POCT INR: INR: 2.6

## 2016-10-08 DIAGNOSIS — G8929 Other chronic pain: Secondary | ICD-10-CM | POA: Diagnosis not present

## 2016-10-08 DIAGNOSIS — I69391 Dysphagia following cerebral infarction: Secondary | ICD-10-CM | POA: Diagnosis not present

## 2016-10-08 DIAGNOSIS — R1310 Dysphagia, unspecified: Secondary | ICD-10-CM | POA: Diagnosis not present

## 2016-10-08 DIAGNOSIS — S32021D Stable burst fracture of second lumbar vertebra, subsequent encounter for fracture with routine healing: Secondary | ICD-10-CM | POA: Diagnosis not present

## 2016-10-08 DIAGNOSIS — S80811D Abrasion, right lower leg, subsequent encounter: Secondary | ICD-10-CM | POA: Diagnosis not present

## 2016-10-08 DIAGNOSIS — S32010D Wedge compression fracture of first lumbar vertebra, subsequent encounter for fracture with routine healing: Secondary | ICD-10-CM | POA: Diagnosis not present

## 2016-10-09 DIAGNOSIS — S32021D Stable burst fracture of second lumbar vertebra, subsequent encounter for fracture with routine healing: Secondary | ICD-10-CM | POA: Diagnosis not present

## 2016-10-09 DIAGNOSIS — S32010D Wedge compression fracture of first lumbar vertebra, subsequent encounter for fracture with routine healing: Secondary | ICD-10-CM | POA: Diagnosis not present

## 2016-10-09 DIAGNOSIS — S80811D Abrasion, right lower leg, subsequent encounter: Secondary | ICD-10-CM | POA: Diagnosis not present

## 2016-10-09 DIAGNOSIS — I69391 Dysphagia following cerebral infarction: Secondary | ICD-10-CM | POA: Diagnosis not present

## 2016-10-09 DIAGNOSIS — R1310 Dysphagia, unspecified: Secondary | ICD-10-CM | POA: Diagnosis not present

## 2016-10-09 DIAGNOSIS — G8929 Other chronic pain: Secondary | ICD-10-CM | POA: Diagnosis not present

## 2016-10-10 DIAGNOSIS — S80811D Abrasion, right lower leg, subsequent encounter: Secondary | ICD-10-CM | POA: Diagnosis not present

## 2016-10-10 DIAGNOSIS — G8929 Other chronic pain: Secondary | ICD-10-CM | POA: Diagnosis not present

## 2016-10-10 DIAGNOSIS — I69391 Dysphagia following cerebral infarction: Secondary | ICD-10-CM | POA: Diagnosis not present

## 2016-10-10 DIAGNOSIS — S32021D Stable burst fracture of second lumbar vertebra, subsequent encounter for fracture with routine healing: Secondary | ICD-10-CM | POA: Diagnosis not present

## 2016-10-10 DIAGNOSIS — S32010D Wedge compression fracture of first lumbar vertebra, subsequent encounter for fracture with routine healing: Secondary | ICD-10-CM | POA: Diagnosis not present

## 2016-10-10 DIAGNOSIS — R1310 Dysphagia, unspecified: Secondary | ICD-10-CM | POA: Diagnosis not present

## 2016-10-13 DIAGNOSIS — G8929 Other chronic pain: Secondary | ICD-10-CM | POA: Diagnosis not present

## 2016-10-13 DIAGNOSIS — S32010D Wedge compression fracture of first lumbar vertebra, subsequent encounter for fracture with routine healing: Secondary | ICD-10-CM | POA: Diagnosis not present

## 2016-10-13 DIAGNOSIS — S32021D Stable burst fracture of second lumbar vertebra, subsequent encounter for fracture with routine healing: Secondary | ICD-10-CM | POA: Diagnosis not present

## 2016-10-13 DIAGNOSIS — S80811D Abrasion, right lower leg, subsequent encounter: Secondary | ICD-10-CM | POA: Diagnosis not present

## 2016-10-13 DIAGNOSIS — I69391 Dysphagia following cerebral infarction: Secondary | ICD-10-CM | POA: Diagnosis not present

## 2016-10-13 DIAGNOSIS — R1310 Dysphagia, unspecified: Secondary | ICD-10-CM | POA: Diagnosis not present

## 2016-10-14 ENCOUNTER — Ambulatory Visit (INDEPENDENT_AMBULATORY_CARE_PROVIDER_SITE_OTHER): Payer: Medicare Other | Admitting: Pharmacist

## 2016-10-14 DIAGNOSIS — Z5181 Encounter for therapeutic drug level monitoring: Secondary | ICD-10-CM

## 2016-10-14 DIAGNOSIS — S80811D Abrasion, right lower leg, subsequent encounter: Secondary | ICD-10-CM | POA: Diagnosis not present

## 2016-10-14 DIAGNOSIS — I69391 Dysphagia following cerebral infarction: Secondary | ICD-10-CM | POA: Diagnosis not present

## 2016-10-14 DIAGNOSIS — S32010D Wedge compression fracture of first lumbar vertebra, subsequent encounter for fracture with routine healing: Secondary | ICD-10-CM | POA: Diagnosis not present

## 2016-10-14 DIAGNOSIS — G8929 Other chronic pain: Secondary | ICD-10-CM | POA: Diagnosis not present

## 2016-10-14 DIAGNOSIS — R1310 Dysphagia, unspecified: Secondary | ICD-10-CM | POA: Diagnosis not present

## 2016-10-14 DIAGNOSIS — I482 Chronic atrial fibrillation, unspecified: Secondary | ICD-10-CM

## 2016-10-14 DIAGNOSIS — S32021D Stable burst fracture of second lumbar vertebra, subsequent encounter for fracture with routine healing: Secondary | ICD-10-CM | POA: Diagnosis not present

## 2016-10-14 LAB — POCT INR: INR: 2.4

## 2016-10-15 DIAGNOSIS — S32010D Wedge compression fracture of first lumbar vertebra, subsequent encounter for fracture with routine healing: Secondary | ICD-10-CM | POA: Diagnosis not present

## 2016-10-15 DIAGNOSIS — I69391 Dysphagia following cerebral infarction: Secondary | ICD-10-CM | POA: Diagnosis not present

## 2016-10-15 DIAGNOSIS — S32021D Stable burst fracture of second lumbar vertebra, subsequent encounter for fracture with routine healing: Secondary | ICD-10-CM | POA: Diagnosis not present

## 2016-10-15 DIAGNOSIS — S80811D Abrasion, right lower leg, subsequent encounter: Secondary | ICD-10-CM | POA: Diagnosis not present

## 2016-10-15 DIAGNOSIS — R1310 Dysphagia, unspecified: Secondary | ICD-10-CM | POA: Diagnosis not present

## 2016-10-15 DIAGNOSIS — G8929 Other chronic pain: Secondary | ICD-10-CM | POA: Diagnosis not present

## 2016-10-17 DIAGNOSIS — G8929 Other chronic pain: Secondary | ICD-10-CM | POA: Diagnosis not present

## 2016-10-17 DIAGNOSIS — R1310 Dysphagia, unspecified: Secondary | ICD-10-CM | POA: Diagnosis not present

## 2016-10-17 DIAGNOSIS — S80811D Abrasion, right lower leg, subsequent encounter: Secondary | ICD-10-CM | POA: Diagnosis not present

## 2016-10-17 DIAGNOSIS — S32021D Stable burst fracture of second lumbar vertebra, subsequent encounter for fracture with routine healing: Secondary | ICD-10-CM | POA: Diagnosis not present

## 2016-10-17 DIAGNOSIS — I69391 Dysphagia following cerebral infarction: Secondary | ICD-10-CM | POA: Diagnosis not present

## 2016-10-17 DIAGNOSIS — S32010D Wedge compression fracture of first lumbar vertebra, subsequent encounter for fracture with routine healing: Secondary | ICD-10-CM | POA: Diagnosis not present

## 2016-10-20 DIAGNOSIS — G8929 Other chronic pain: Secondary | ICD-10-CM | POA: Diagnosis not present

## 2016-10-20 DIAGNOSIS — S32010D Wedge compression fracture of first lumbar vertebra, subsequent encounter for fracture with routine healing: Secondary | ICD-10-CM | POA: Diagnosis not present

## 2016-10-20 DIAGNOSIS — R1310 Dysphagia, unspecified: Secondary | ICD-10-CM | POA: Diagnosis not present

## 2016-10-20 DIAGNOSIS — S80811D Abrasion, right lower leg, subsequent encounter: Secondary | ICD-10-CM | POA: Diagnosis not present

## 2016-10-20 DIAGNOSIS — I69391 Dysphagia following cerebral infarction: Secondary | ICD-10-CM | POA: Diagnosis not present

## 2016-10-20 DIAGNOSIS — S32021D Stable burst fracture of second lumbar vertebra, subsequent encounter for fracture with routine healing: Secondary | ICD-10-CM | POA: Diagnosis not present

## 2016-10-21 ENCOUNTER — Ambulatory Visit (INDEPENDENT_AMBULATORY_CARE_PROVIDER_SITE_OTHER): Payer: Medicare Other | Admitting: Interventional Cardiology

## 2016-10-21 DIAGNOSIS — S32010D Wedge compression fracture of first lumbar vertebra, subsequent encounter for fracture with routine healing: Secondary | ICD-10-CM | POA: Diagnosis not present

## 2016-10-21 DIAGNOSIS — S80811D Abrasion, right lower leg, subsequent encounter: Secondary | ICD-10-CM | POA: Diagnosis not present

## 2016-10-21 DIAGNOSIS — G8929 Other chronic pain: Secondary | ICD-10-CM | POA: Diagnosis not present

## 2016-10-21 DIAGNOSIS — S32021D Stable burst fracture of second lumbar vertebra, subsequent encounter for fracture with routine healing: Secondary | ICD-10-CM | POA: Diagnosis not present

## 2016-10-21 DIAGNOSIS — I69391 Dysphagia following cerebral infarction: Secondary | ICD-10-CM | POA: Diagnosis not present

## 2016-10-21 DIAGNOSIS — Z5181 Encounter for therapeutic drug level monitoring: Secondary | ICD-10-CM

## 2016-10-21 DIAGNOSIS — R1310 Dysphagia, unspecified: Secondary | ICD-10-CM | POA: Diagnosis not present

## 2016-10-21 LAB — POCT INR: INR: 1.9

## 2016-10-27 DIAGNOSIS — R1310 Dysphagia, unspecified: Secondary | ICD-10-CM | POA: Diagnosis not present

## 2016-10-27 DIAGNOSIS — S32010D Wedge compression fracture of first lumbar vertebra, subsequent encounter for fracture with routine healing: Secondary | ICD-10-CM | POA: Diagnosis not present

## 2016-10-27 DIAGNOSIS — S32021D Stable burst fracture of second lumbar vertebra, subsequent encounter for fracture with routine healing: Secondary | ICD-10-CM | POA: Diagnosis not present

## 2016-10-27 DIAGNOSIS — I69391 Dysphagia following cerebral infarction: Secondary | ICD-10-CM | POA: Diagnosis not present

## 2016-10-27 DIAGNOSIS — S80811D Abrasion, right lower leg, subsequent encounter: Secondary | ICD-10-CM | POA: Diagnosis not present

## 2016-10-27 DIAGNOSIS — G8929 Other chronic pain: Secondary | ICD-10-CM | POA: Diagnosis not present

## 2016-10-28 ENCOUNTER — Ambulatory Visit (INDEPENDENT_AMBULATORY_CARE_PROVIDER_SITE_OTHER): Payer: Medicare Other

## 2016-10-28 DIAGNOSIS — R1310 Dysphagia, unspecified: Secondary | ICD-10-CM | POA: Diagnosis not present

## 2016-10-28 DIAGNOSIS — S32010D Wedge compression fracture of first lumbar vertebra, subsequent encounter for fracture with routine healing: Secondary | ICD-10-CM | POA: Diagnosis not present

## 2016-10-28 DIAGNOSIS — S32021D Stable burst fracture of second lumbar vertebra, subsequent encounter for fracture with routine healing: Secondary | ICD-10-CM | POA: Diagnosis not present

## 2016-10-28 DIAGNOSIS — Z5181 Encounter for therapeutic drug level monitoring: Secondary | ICD-10-CM

## 2016-10-28 DIAGNOSIS — G8929 Other chronic pain: Secondary | ICD-10-CM | POA: Diagnosis not present

## 2016-10-28 DIAGNOSIS — S80811D Abrasion, right lower leg, subsequent encounter: Secondary | ICD-10-CM | POA: Diagnosis not present

## 2016-10-28 DIAGNOSIS — I4891 Unspecified atrial fibrillation: Secondary | ICD-10-CM

## 2016-10-28 DIAGNOSIS — I69391 Dysphagia following cerebral infarction: Secondary | ICD-10-CM | POA: Diagnosis not present

## 2016-10-28 LAB — POCT INR: INR: 2.6

## 2016-10-29 DIAGNOSIS — S80811D Abrasion, right lower leg, subsequent encounter: Secondary | ICD-10-CM | POA: Diagnosis not present

## 2016-10-29 DIAGNOSIS — I69391 Dysphagia following cerebral infarction: Secondary | ICD-10-CM | POA: Diagnosis not present

## 2016-10-29 DIAGNOSIS — S32010D Wedge compression fracture of first lumbar vertebra, subsequent encounter for fracture with routine healing: Secondary | ICD-10-CM | POA: Diagnosis not present

## 2016-10-29 DIAGNOSIS — G8929 Other chronic pain: Secondary | ICD-10-CM | POA: Diagnosis not present

## 2016-10-29 DIAGNOSIS — R1310 Dysphagia, unspecified: Secondary | ICD-10-CM | POA: Diagnosis not present

## 2016-10-29 DIAGNOSIS — S32021D Stable burst fracture of second lumbar vertebra, subsequent encounter for fracture with routine healing: Secondary | ICD-10-CM | POA: Diagnosis not present

## 2016-10-30 DIAGNOSIS — G8929 Other chronic pain: Secondary | ICD-10-CM | POA: Diagnosis not present

## 2016-10-30 DIAGNOSIS — R1310 Dysphagia, unspecified: Secondary | ICD-10-CM | POA: Diagnosis not present

## 2016-10-30 DIAGNOSIS — S32021D Stable burst fracture of second lumbar vertebra, subsequent encounter for fracture with routine healing: Secondary | ICD-10-CM | POA: Diagnosis not present

## 2016-10-30 DIAGNOSIS — I69391 Dysphagia following cerebral infarction: Secondary | ICD-10-CM | POA: Diagnosis not present

## 2016-10-30 DIAGNOSIS — S80811D Abrasion, right lower leg, subsequent encounter: Secondary | ICD-10-CM | POA: Diagnosis not present

## 2016-10-30 DIAGNOSIS — S32010D Wedge compression fracture of first lumbar vertebra, subsequent encounter for fracture with routine healing: Secondary | ICD-10-CM | POA: Diagnosis not present

## 2016-11-03 DIAGNOSIS — M4856XG Collapsed vertebra, not elsewhere classified, lumbar region, subsequent encounter for fracture with delayed healing: Secondary | ICD-10-CM | POA: Diagnosis not present

## 2016-11-03 DIAGNOSIS — E782 Mixed hyperlipidemia: Secondary | ICD-10-CM | POA: Diagnosis not present

## 2016-11-03 DIAGNOSIS — I480 Paroxysmal atrial fibrillation: Secondary | ICD-10-CM | POA: Diagnosis not present

## 2016-11-03 DIAGNOSIS — R600 Localized edema: Secondary | ICD-10-CM | POA: Diagnosis not present

## 2016-11-03 DIAGNOSIS — E039 Hypothyroidism, unspecified: Secondary | ICD-10-CM | POA: Diagnosis not present

## 2016-11-07 DIAGNOSIS — D649 Anemia, unspecified: Secondary | ICD-10-CM | POA: Diagnosis not present

## 2016-11-11 ENCOUNTER — Telehealth: Payer: Self-pay | Admitting: Pharmacist

## 2016-11-11 NOTE — Telephone Encounter (Signed)
Nurse called to state that she will be going to home tomorrow to get INR and will call with results at that time. She also states that tomorrow will be her last home visit with the patient. We will schedule him to be monitored in office after tomorrow.

## 2016-11-12 ENCOUNTER — Ambulatory Visit (INDEPENDENT_AMBULATORY_CARE_PROVIDER_SITE_OTHER): Payer: Medicare Other | Admitting: Cardiovascular Disease

## 2016-11-12 DIAGNOSIS — I4891 Unspecified atrial fibrillation: Secondary | ICD-10-CM

## 2016-11-12 DIAGNOSIS — G8929 Other chronic pain: Secondary | ICD-10-CM | POA: Diagnosis not present

## 2016-11-12 DIAGNOSIS — Z5181 Encounter for therapeutic drug level monitoring: Secondary | ICD-10-CM

## 2016-11-12 DIAGNOSIS — S32021D Stable burst fracture of second lumbar vertebra, subsequent encounter for fracture with routine healing: Secondary | ICD-10-CM | POA: Diagnosis not present

## 2016-11-12 DIAGNOSIS — R1310 Dysphagia, unspecified: Secondary | ICD-10-CM | POA: Diagnosis not present

## 2016-11-12 DIAGNOSIS — S80811D Abrasion, right lower leg, subsequent encounter: Secondary | ICD-10-CM | POA: Diagnosis not present

## 2016-11-12 DIAGNOSIS — I69391 Dysphagia following cerebral infarction: Secondary | ICD-10-CM | POA: Diagnosis not present

## 2016-11-12 DIAGNOSIS — S32010D Wedge compression fracture of first lumbar vertebra, subsequent encounter for fracture with routine healing: Secondary | ICD-10-CM | POA: Diagnosis not present

## 2016-11-12 LAB — POCT INR: INR: 3

## 2016-12-08 ENCOUNTER — Ambulatory Visit (INDEPENDENT_AMBULATORY_CARE_PROVIDER_SITE_OTHER): Payer: Medicare Other | Admitting: Pharmacist

## 2016-12-08 DIAGNOSIS — R600 Localized edema: Secondary | ICD-10-CM | POA: Diagnosis not present

## 2016-12-08 DIAGNOSIS — D649 Anemia, unspecified: Secondary | ICD-10-CM | POA: Diagnosis not present

## 2016-12-08 DIAGNOSIS — R5383 Other fatigue: Secondary | ICD-10-CM | POA: Diagnosis not present

## 2016-12-08 DIAGNOSIS — I4891 Unspecified atrial fibrillation: Secondary | ICD-10-CM | POA: Diagnosis not present

## 2016-12-08 DIAGNOSIS — M810 Age-related osteoporosis without current pathological fracture: Secondary | ICD-10-CM | POA: Diagnosis not present

## 2016-12-08 DIAGNOSIS — M8088XD Other osteoporosis with current pathological fracture, vertebra(e), subsequent encounter for fracture with routine healing: Secondary | ICD-10-CM | POA: Diagnosis not present

## 2016-12-08 DIAGNOSIS — Z7901 Long term (current) use of anticoagulants: Secondary | ICD-10-CM | POA: Diagnosis not present

## 2016-12-08 DIAGNOSIS — M8000XD Age-related osteoporosis with current pathological fracture, unspecified site, subsequent encounter for fracture with routine healing: Secondary | ICD-10-CM | POA: Diagnosis not present

## 2016-12-08 DIAGNOSIS — Z5181 Encounter for therapeutic drug level monitoring: Secondary | ICD-10-CM | POA: Diagnosis not present

## 2016-12-08 DIAGNOSIS — M79675 Pain in left toe(s): Secondary | ICD-10-CM | POA: Diagnosis not present

## 2016-12-08 LAB — POCT INR: INR: 3.4

## 2016-12-23 ENCOUNTER — Other Ambulatory Visit: Payer: Self-pay | Admitting: Internal Medicine

## 2016-12-29 ENCOUNTER — Ambulatory Visit (INDEPENDENT_AMBULATORY_CARE_PROVIDER_SITE_OTHER): Payer: Medicare Other

## 2016-12-29 DIAGNOSIS — I4891 Unspecified atrial fibrillation: Secondary | ICD-10-CM

## 2016-12-29 DIAGNOSIS — Z5181 Encounter for therapeutic drug level monitoring: Secondary | ICD-10-CM

## 2016-12-29 LAB — POCT INR: INR: 2.6

## 2017-01-26 ENCOUNTER — Ambulatory Visit (INDEPENDENT_AMBULATORY_CARE_PROVIDER_SITE_OTHER): Payer: Medicare Other | Admitting: *Deleted

## 2017-01-26 DIAGNOSIS — I4891 Unspecified atrial fibrillation: Secondary | ICD-10-CM | POA: Diagnosis not present

## 2017-01-26 DIAGNOSIS — D649 Anemia, unspecified: Secondary | ICD-10-CM | POA: Diagnosis not present

## 2017-01-26 DIAGNOSIS — Z5181 Encounter for therapeutic drug level monitoring: Secondary | ICD-10-CM

## 2017-01-26 DIAGNOSIS — E782 Mixed hyperlipidemia: Secondary | ICD-10-CM | POA: Diagnosis not present

## 2017-01-26 DIAGNOSIS — E039 Hypothyroidism, unspecified: Secondary | ICD-10-CM | POA: Diagnosis not present

## 2017-01-26 DIAGNOSIS — M8088XD Other osteoporosis with current pathological fracture, vertebra(e), subsequent encounter for fracture with routine healing: Secondary | ICD-10-CM | POA: Diagnosis not present

## 2017-01-26 LAB — POCT INR: INR: 3.3

## 2017-01-27 DIAGNOSIS — D649 Anemia, unspecified: Secondary | ICD-10-CM | POA: Diagnosis not present

## 2017-01-27 DIAGNOSIS — M8088XD Other osteoporosis with current pathological fracture, vertebra(e), subsequent encounter for fracture with routine healing: Secondary | ICD-10-CM | POA: Diagnosis not present

## 2017-01-27 DIAGNOSIS — E039 Hypothyroidism, unspecified: Secondary | ICD-10-CM | POA: Diagnosis not present

## 2017-02-02 DIAGNOSIS — I480 Paroxysmal atrial fibrillation: Secondary | ICD-10-CM | POA: Diagnosis not present

## 2017-02-02 DIAGNOSIS — E039 Hypothyroidism, unspecified: Secondary | ICD-10-CM | POA: Diagnosis not present

## 2017-02-02 DIAGNOSIS — F0390 Unspecified dementia without behavioral disturbance: Secondary | ICD-10-CM | POA: Diagnosis not present

## 2017-02-02 DIAGNOSIS — E782 Mixed hyperlipidemia: Secondary | ICD-10-CM | POA: Diagnosis not present

## 2017-02-06 ENCOUNTER — Other Ambulatory Visit: Payer: Self-pay | Admitting: Internal Medicine

## 2017-02-16 ENCOUNTER — Ambulatory Visit (INDEPENDENT_AMBULATORY_CARE_PROVIDER_SITE_OTHER): Payer: Medicare Other | Admitting: *Deleted

## 2017-02-16 DIAGNOSIS — Z5181 Encounter for therapeutic drug level monitoring: Secondary | ICD-10-CM

## 2017-02-16 DIAGNOSIS — I4891 Unspecified atrial fibrillation: Secondary | ICD-10-CM | POA: Diagnosis not present

## 2017-02-16 LAB — POCT INR: INR: 2.1

## 2017-03-16 ENCOUNTER — Encounter: Payer: Self-pay | Admitting: Internal Medicine

## 2017-03-16 ENCOUNTER — Ambulatory Visit (INDEPENDENT_AMBULATORY_CARE_PROVIDER_SITE_OTHER): Payer: Medicare Other | Admitting: Internal Medicine

## 2017-03-16 ENCOUNTER — Ambulatory Visit (INDEPENDENT_AMBULATORY_CARE_PROVIDER_SITE_OTHER): Payer: Medicare Other

## 2017-03-16 VITALS — BP 136/76 | HR 60 | Ht 68.0 in | Wt 160.1 lb

## 2017-03-16 DIAGNOSIS — I495 Sick sinus syndrome: Secondary | ICD-10-CM

## 2017-03-16 DIAGNOSIS — I5032 Chronic diastolic (congestive) heart failure: Secondary | ICD-10-CM

## 2017-03-16 DIAGNOSIS — I4891 Unspecified atrial fibrillation: Secondary | ICD-10-CM | POA: Diagnosis not present

## 2017-03-16 DIAGNOSIS — M4640 Discitis, unspecified, site unspecified: Secondary | ICD-10-CM | POA: Diagnosis not present

## 2017-03-16 DIAGNOSIS — Z7901 Long term (current) use of anticoagulants: Secondary | ICD-10-CM | POA: Diagnosis not present

## 2017-03-16 DIAGNOSIS — I480 Paroxysmal atrial fibrillation: Secondary | ICD-10-CM | POA: Diagnosis not present

## 2017-03-16 DIAGNOSIS — Z5181 Encounter for therapeutic drug level monitoring: Secondary | ICD-10-CM

## 2017-03-16 LAB — CUP PACEART INCLINIC DEVICE CHECK
Battery Impedance: 2700 Ohm
Battery Voltage: 2.76 V
Date Time Interrogation Session: 20180416103506
Implantable Lead Implant Date: 19961031
Implantable Lead Location: 753859
Implantable Pulse Generator Implant Date: 20081103
Lead Channel Impedance Value: 315 Ohm
Lead Channel Impedance Value: 418 Ohm
Lead Channel Pacing Threshold Amplitude: 0.5 V
Lead Channel Pacing Threshold Pulse Width: 0.6 ms
Lead Channel Sensing Intrinsic Amplitude: 0.7 mV
Lead Channel Setting Pacing Amplitude: 3 V
Lead Channel Setting Sensing Sensitivity: 2 mV
MDC IDC LEAD IMPLANT DT: 19961031
MDC IDC LEAD LOCATION: 753860
MDC IDC MSMT LEADCHNL RV PACING THRESHOLD AMPLITUDE: 1.75 V
MDC IDC MSMT LEADCHNL RV PACING THRESHOLD PULSEWIDTH: 0.4 ms
MDC IDC MSMT LEADCHNL RV SENSING INTR AMPL: 5.7 mV
MDC IDC SET LEADCHNL RA PACING AMPLITUDE: 5 V
MDC IDC SET LEADCHNL RV PACING PULSEWIDTH: 0.4 ms
Pulse Gen Serial Number: 1990093

## 2017-03-16 LAB — CBC WITH DIFFERENTIAL/PLATELET
BASOS ABS: 0 10*3/uL (ref 0.0–0.2)
Basos: 1 %
EOS (ABSOLUTE): 0.2 10*3/uL (ref 0.0–0.4)
Eos: 3 %
HEMOGLOBIN: 10.7 g/dL — AB (ref 13.0–17.7)
Hematocrit: 31.8 % — ABNORMAL LOW (ref 37.5–51.0)
Immature Grans (Abs): 0 10*3/uL (ref 0.0–0.1)
Immature Granulocytes: 0 %
LYMPHS ABS: 1.8 10*3/uL (ref 0.7–3.1)
Lymphs: 25 %
MCH: 30.8 pg (ref 26.6–33.0)
MCHC: 33.6 g/dL (ref 31.5–35.7)
MCV: 92 fL (ref 79–97)
MONOCYTES: 8 %
MONOS ABS: 0.6 10*3/uL (ref 0.1–0.9)
Neutrophils Absolute: 4.4 10*3/uL (ref 1.4–7.0)
Neutrophils: 63 %
Platelets: 253 10*3/uL (ref 150–379)
RBC: 3.47 x10E6/uL — AB (ref 4.14–5.80)
RDW: 15 % (ref 12.3–15.4)
WBC: 7 10*3/uL (ref 3.4–10.8)

## 2017-03-16 LAB — BASIC METABOLIC PANEL
BUN/Creatinine Ratio: 19 (ref 10–24)
BUN: 36 mg/dL (ref 10–36)
CHLORIDE: 101 mmol/L (ref 96–106)
CO2: 21 mmol/L (ref 18–29)
Calcium: 9 mg/dL (ref 8.6–10.2)
Creatinine, Ser: 1.9 mg/dL — ABNORMAL HIGH (ref 0.76–1.27)
GFR calc Af Amer: 35 mL/min/{1.73_m2} — ABNORMAL LOW (ref >59)
GFR calc non Af Amer: 30 mL/min/{1.73_m2} — ABNORMAL LOW (ref >59)
GLUCOSE: 106 mg/dL — AB (ref 65–99)
POTASSIUM: 4.8 mmol/L (ref 3.5–5.2)
SODIUM: 141 mmol/L (ref 134–144)

## 2017-03-16 LAB — HEPATIC FUNCTION PANEL
ALBUMIN: 4.2 g/dL (ref 3.2–4.6)
ALT: 8 IU/L (ref 0–44)
AST: 15 IU/L (ref 0–40)
Alkaline Phosphatase: 85 IU/L (ref 39–117)
Bilirubin Total: 0.4 mg/dL (ref 0.0–1.2)
Bilirubin, Direct: 0.08 mg/dL (ref 0.00–0.40)
TOTAL PROTEIN: 6.9 g/dL (ref 6.0–8.5)

## 2017-03-16 LAB — TSH: TSH: 118.2 u[IU]/mL — ABNORMAL HIGH (ref 0.450–4.500)

## 2017-03-16 LAB — T4: T4 TOTAL: 2.1 ug/dL — AB (ref 4.5–12.0)

## 2017-03-16 LAB — POCT INR: INR: 2.2

## 2017-03-16 MED ORDER — AMIODARONE HCL 200 MG PO TABS: 100.0000 mg | ORAL_TABLET | Freq: Every day | ORAL | 3 refills | Status: AC

## 2017-03-16 NOTE — Progress Notes (Signed)
PCP: Merrilee Seashore, MD  [name] is a 83 y.o. male who presents today for routine electrophysiology followup.  Since last being seen in our clinic, the patient has declined.  He has had no symptoms of afib.  He has been hospitalized with discitis and has been followed by ID.  He was previously on amoxil but is no longer.  His wife is not sure if he should be on this or not.  His primary concern is with L leg weakness.   This has been an issue for several months.  He is not working out with his care giver or walking very much now due to this.  Today, he denies symptoms of palpitations, chest pain, shortness of breath,  lower extremity edema, dizziness, presyncope, or syncope.  The patient is otherwise without complaint today.   Past Medical History:  Diagnosis Date  . Ataxia    followed by Dr. Erling Cruz  . Atrial fibrillation (HCC)    coumadin Rx  . BPH (benign prostatic hyperplasia)   . Hyperlipidemia   . Hypertension   . Hypothyroidism   . Peripheral neuropathy   . Sick sinus syndrome (HCC)    s/p PPM  . TIA (transient ischemic attack)    carotid dopplers 5/11: 0-39% bilateral; follow up due 03/2012   Past Surgical History:  Procedure Laterality Date  . PACEMAKER INSERTION     implanted 10/96, most recent Gen Change by Dr Olevia Perches 8/08  . TEE WITHOUT CARDIOVERSION N/A 05/29/2016   Procedure: TRANSESOPHAGEAL ECHOCARDIOGRAM (TEE);  Surgeon: Larey Dresser, MD;  Location: [facility] ENDOSCOPY;  Service: Cardiovascular;  Laterality: N/A;    Current Outpatient Prescriptions  Medication Sig Dispense Refill  . amiodarone (PACERONE) 200 MG tablet Take 0.5 tablets (100 mg total) by mouth daily. *Patient is overdue for an appointment and needs to call and schedule for further refills* 7 tablet 0  . warfarin (COUMADIN) 5 MG tablet Take 0.5-1 tablets (2.5-5 mg total) by mouth as directed. Take 2.5 mg (0.5 tablet) on Tues, Thurs, Sat and Sun and Take 5 mg (1 tablet) on Mon, Wed and Fri    . warfarin  (COUMADIN) 5 MG tablet TAKE ONE TABLET BY MOUTH AS DIRECTED 90 tablet 0   No current facility-administered medications for this visit.     Physical Exam: Vitals:   03/16/17 0935  BP: 136/76  Pulse: 60  SpO2: 97%  Weight: 160 lb 2 oz (72.6 kg)  Height: 5\' 8"  (1.727 m)    GEN- The patient is elderly and frail appearing, alert and oriented x 3 today.   Head- normocephalic, atraumatic Eyes-  Sclera clear, conjunctiva pink Ears- hearing intact Oropharynx- clear Lungs- Clear to ausculation bilaterally, normal work of breathing Chest- pacemaker pocket is well healed Heart- Regular rate and rhythm  GI- soft, NT, ND, + BS Extremities- no clubbing, cyanosis, +2 edema Neuro- L leg is noticeably weak with flexion/ extension below the knee  Pacemaker interrogation- reviewed in detail today,  See PACEART report ekg today reveals atrial pacing at 60 bpm bpm,   Qtc 420 msec  Assessment and Plan:  SICK SINUS SYNDROME  Normal pacemaker function  See Pace Art report  No changes today   ATRIAL FIBRILLATION  Maintaining sinus rhythm with amiodarone 100mg  daily.  AF burden is 8% (7% a year ago) Continue coumadin  Check LFTs, TFTS today  Chronic diastolic dysfunction/ edema Dependant edema on exam.  Likely worsened by inactivity He had renal failure with last labs.  bmet today.  If creatinine is improved, would start lasix 20mg  daily Consider support hose Regular activity encouraged  L leg weakness  Apparent on exam He states that he has an appointment with neurology scheduled for next month I think that evaluation of this is very necessary, particularly given prior discitis  Prior discitis No longer on amoxil I am not sure if this was planned to be long term suppressive therapy or not Currently not on antibiotics TEE from 6/17 and ID consult reviewed Clinically without symptoms at this time Given advanced age, would be a poor candidate for device extraction, so I am happy that  he appears to be doing better at this time. I have sent a staff message to Dr Baxter Flattery regarding her input on duration of pcn therapy  Extensive visit today with multiple acute medical issues addressed.  A high level of decision making was required for this visit  TTMs Return to the device clinic in 6 months EP PA-C to see in a year  Thompson Grayer MD, [facility] 03/16/2017 9:56 AM

## 2017-03-16 NOTE — Patient Instructions (Addendum)
Medication Instructions:  Your physician recommends that you continue on your current medications as directed. Please refer to the Current Medication list given to you today.   I have left a message for PCP to call me in regards to your medications.  Will call with any changes  Labwork: Your physician recommends that you return for lab work today: CBC/BMP/Liver/TSH/T4   Testing/Procedures: None ordered   Follow-Up: Remote monitoring is used to monitor your Pacemaker from home. This monitoring reduces the number of office visits required to check your device to one time per year. It allows [place] to keep an eye on the functioning of your device to ensure it is working properly. You are scheduled for a device check from home on 06/15/17. You may send your transmission at any time that day. If you have a wireless device, the transmission will be sent automatically. After your physician reviews your transmission, you will receive a postcard with your next transmission date.  Your physician recommends that you schedule a follow-up appointment in: 6 months in device clinic and 12 months with Tommye Standard, PA     Any Other Special Instructions Will Be Listed Below (If Applicable).     If you need a refill on your cardiac medications before your next appointment, please call your pharmacy.

## 2017-03-30 DIAGNOSIS — E039 Hypothyroidism, unspecified: Secondary | ICD-10-CM | POA: Diagnosis not present

## 2017-04-20 ENCOUNTER — Ambulatory Visit (INDEPENDENT_AMBULATORY_CARE_PROVIDER_SITE_OTHER): Payer: Medicare Other | Admitting: Neurology

## 2017-04-20 ENCOUNTER — Ambulatory Visit (INDEPENDENT_AMBULATORY_CARE_PROVIDER_SITE_OTHER): Payer: Medicare Other | Admitting: *Deleted

## 2017-04-20 ENCOUNTER — Encounter: Payer: Self-pay | Admitting: Neurology

## 2017-04-20 VITALS — BP 156/78 | HR 68 | Ht 68.0 in | Wt 160.0 lb

## 2017-04-20 DIAGNOSIS — F0390 Unspecified dementia without behavioral disturbance: Secondary | ICD-10-CM

## 2017-04-20 DIAGNOSIS — R2681 Unsteadiness on feet: Secondary | ICD-10-CM | POA: Diagnosis not present

## 2017-04-20 DIAGNOSIS — Z5181 Encounter for therapeutic drug level monitoring: Secondary | ICD-10-CM | POA: Diagnosis not present

## 2017-04-20 DIAGNOSIS — I4891 Unspecified atrial fibrillation: Secondary | ICD-10-CM

## 2017-04-20 DIAGNOSIS — F03B0 Unspecified dementia, moderate, without behavioral disturbance, psychotic disturbance, mood disturbance, and anxiety: Secondary | ICD-10-CM

## 2017-04-20 LAB — POCT INR: INR: 1.6

## 2017-04-20 NOTE — Progress Notes (Signed)
NEUROLOGY CONSULTATION NOTE  [name] MRN: [id number] DOB: Apr 18, 1924  Referring provider: Dr. Merrilee Seashore Primary care provider: Dr. Merrilee Seashore  Reason for consult:  dementia  Dear Dr Ashby Dawes:  Thank you for your kind referral of [name] for consultation of the above symptoms. Although his history is well known to you, please allow me to reiterate it for the purpose of our medical record. The patient was accompanied to the clinic by his wife and caregiver [name] who also provide collateral information. Records and images were personally reviewed where available.  HISTORY OF PRESENT ILLNESS: This is a pleasant 83 year old left-handed man with a history of hypertension, hyperlipidemia, hypothyroidism, atrial fibrillation, peripheral neuropathy, right PCA stroke in 1995, presenting for evaluation of dementia. He feels his memory is "pretty good." His wife started noticing memory changes a year ago after he had a fall and injured his back. Sometimes he wakes up and does not know where he is. His sitter reports he repeats the same questions a few minutes later. He has noticed some word-finding difficulties in the past couple of years. He was driving and managing his medications prior to the fall. Since then he has been wheelchair-bound. His wife has always been in charge of bills and has taken over his medications. He is able to feed himself. Due to mobility issues, he needs assistance with dressing and bathing. He has home PT. A sitter comes 8 hours a day. No personality changes, no hallucinations.   His wife is also concerned that since the fall, he has not been moving his left side as much. He is left-handed, but now uses his right hand to do more. He does not use his left leg as well as before. She attributes the left arm change to a PICC line he had for IV antibiotics after the fall. Records from June 2017 admission were reviewed, he was admitted for low back pain  and confusion. CT lumbar spine showed compression fracture of L1 and burst fracture of L2 with ESR 140. He was felt to be a poor neurosurgical candidate and underwent biopsy under interventional radiology with disc site culture showing enteroccocus. TEE negative for vegetations. He was discharged to SNF with IV antibiotic. He reports occasional numbness and tingling in his arms and legs. He has recently had some "leaks." He has occasional hand tremors. He denies any headaches, dizziness, diplopia, dysarthria/dysphagia, neck/back pain, bowel dysfunction, anosmia. No family history of dementia, no history of concussions or alcohol use.   I personally reviewed head CT without contrast done 05/2016 which did not show any acute changes, there was an area of encephalomalacia in the right parietal lobe, old left cerebellar infarct with encephalomalacia, diffuse atrophy, moderate to severe chronic microvascular disease.  Laboratory Data: Lab Results  Component Value Date   TSH 118.200 (H) 03/16/2017   Lab Results  Component Value Date   [id number] 05/23/2016     PAST MEDICAL HISTORY: Past Medical History:  Diagnosis Date  . Ataxia    followed by Dr. Erling Cruz  . Atrial fibrillation (HCC)    coumadin Rx  . BPH (benign prostatic hyperplasia)   . Hyperlipidemia   . Hypertension   . Hypothyroidism   . Peripheral neuropathy   . Sick sinus syndrome (HCC)    s/p PPM  . TIA (transient ischemic attack)    carotid dopplers 5/11: 0-39% bilateral; follow up due 03/2012    PAST SURGICAL HISTORY: Past Surgical History:  Procedure Laterality Date  .  PACEMAKER INSERTION     implanted 10/96, most recent Gen Change by Dr Olevia Perches 8/08  . TEE WITHOUT CARDIOVERSION N/A 05/29/2016   Procedure: TRANSESOPHAGEAL ECHOCARDIOGRAM (TEE);  Surgeon: Larey Dresser, MD;  Location: [facility] ENDOSCOPY;  Service: Cardiovascular;  Laterality: N/A;    MEDICATIONS:  Outpatient Encounter Prescriptions as of 04/20/2017  Medication  Sig  . amiodarone (PACERONE) 200 MG tablet Take 0.5 tablets (100 mg total) by mouth daily.  Marland Kitchen levothyroxine (SYNTHROID, LEVOTHROID) 25 MCG tablet Take 25 mcg by mouth daily before breakfast.  . warfarin (COUMADIN) 5 MG tablet Take 0.5-1 tablets (2.5-5 mg total) by mouth as directed. Take 2.5 mg (0.5 tablet) on Tues, Thurs, Sat and Sun and Take 5 mg (1 tablet) on Mon, Wed and Fri  . [DISCONTINUED] warfarin (COUMADIN) 5 MG tablet TAKE ONE TABLET BY MOUTH AS DIRECTED   No facility-administered encounter medications on file as of 04/20/2017.     ALLERGIES: Allergies  Allergen Reactions  . Guaifenesin Er Palpitations    Pt can not take mucinex PM  . Prednisone Other (See Comments)    HIGH doses make patient feel "crazy"    FAMILY HISTORY: Family History  Problem Relation Age of Onset  . Stroke Mother     SOCIAL HISTORY: Social History   Social History  . Marital status: Married    Spouse name: [name]  . Number of children: 4  . Years of education: College   Occupational History  . Retired Retired    [employer]   Social History Main Topics  . Smoking status: Former Smoker    Quit date: 12/01/1977  . Smokeless tobacco: Never Used  . Alcohol use No     Comment: quit in 1995  . Drug use: No  . Sexual activity: Not on file   Other Topics Concern  . Not on file   Social History Narrative   Patient lives at home with his spouse.   Caffeine Use: 1 caffeine beverage daily    REVIEW OF SYSTEMS: Constitutional: No fevers, chills, or sweats, no generalized fatigue, change in appetite Eyes: No visual changes, double vision, eye pain Ear, nose and throat: No hearing loss, ear pain, nasal congestion, sore throat Cardiovascular: No chest pain, palpitations Respiratory:  No shortness of breath at rest or with exertion, wheezes GastrointestinaI: No nausea, vomiting, diarrhea, abdominal pain, fecal incontinence Genitourinary:  No dysuria, urinary retention or  frequency Musculoskeletal:  No neck pain, back pain Integumentary: No rash, pruritus, skin lesions Neurological: as above Psychiatric: No depression, insomnia, anxiety Endocrine: No palpitations, fatigue, diaphoresis, mood swings, change in appetite, change in weight, increased thirst Hematologic/Lymphatic:  No anemia, purpura, petechiae. Allergic/Immunologic: no itchy/runny eyes, nasal congestion, recent allergic reactions, rashes  PHYSICAL EXAM: Vitals:   04/20/17 1004  BP: (!) 156/78  Pulse: 68   General: No acute distress, sitting on wheelchair Head:  Normocephalic/atraumatic Eyes: Fundoscopic exam shows bilateral sharp discs, no vessel changes, exudates, or hemorrhages Neck: supple, no paraspinal tenderness, full range of motion Back: No paraspinal tenderness Heart: regular rate and rhythm Lungs: Clear to auscultation bilaterally. Vascular: No carotid bruits. Skin/Extremities: No rash, no edema Neurological Exam: Mental status: alert and oriented to person, place, and day of week, year, no dysarthria or aphasia, Fund of knowledge is appropriate.  Recent and remote memory are impaired.  Attention and concentration are reduced.    Able to name objects and repeat phrases. CDT 5/5 with difficulty writing with left hand MMSE - Mini Mental State Exam  04/20/2017  Orientation to time 2  Orientation to Place 4  Registration 3  Attention/ Calculation 1  Recall 0  Language- name 2 objects 2  Language- repeat 1  Language- follow 3 step command 2  Language- read & follow direction 1  Write a sentence 0  Copy design 1  Total score 17   Cranial nerves: CN I: not tested CN II: pupils equal, round and reactive to light, visual fields intact, fundi unremarkable. CN III, IV, VI:  full range of motion, no nystagmus, no ptosis CN V: facial sensation intact CN VII: upper and lower face symmetric CN VIII: hearing intact to finger rub CN IX, X: gag intact, uvula midline CN XI:  sternocleidomastoid and trapezius muscles intact CN XII: tongue midline Bulk & Tone: normal, no fasciculations. Motor: 5/5 throughout with no pronator drift. Sensation: intact to light touch, cold, pin on both UE, decreased vibration and pin to ankles bilaterally. No extinction to double simultaneous stimulation.   Deep Tendon Reflexes: +2 throughout except for absent ankle jerks bilaterally, no ankle clonus Plantar responses: downgoing bilaterally Cerebellar: no incoordination on finger to nose testing Gait: not tested, patient wheelchair-bound Tremor: none noted in office today  IMPRESSION: This is a pleasant 83 year old right-handed man with a history of hypertension, hyperlipidemia, hypothyroidism, atrial fibrillation, peripheral neuropathy, right PCA stroke in 1995, presenting for evaluation of dementia. His neurological exam shows length-dependent neuropathy, non-focal but note of difficulty writing with left hand. MMSE today 17/30, indicating moderate dementia, possibly vascular type. His wife feels that he had more left-sided weakness after his fall in June, head CT done at that time did not show any acute changes. We discussed repeating another CT head without contrast, but after discussion with his wife this would not change management, we agreed to hold off unless he had significant changes in exam. He is already on Coumadin for anticoagulation for secondary stroke prevention. Continue control of vascular risk factors. We discussed dementia and medication such as Aricept, expectations and side effects, and have agreed to hold off on starting cholinesterase inhibitors at this time. We discussed continued physical therapy for the left-sided weakness and neuropathy. He will follow-up in 1 year and knows to call for any changes.   Thank you for allowing me to participate in the care of this patient. Please do not hesitate to call for any questions or concerns.   Ellouise Newer, M.D.  CC: Dr.  Ashby Dawes

## 2017-04-20 NOTE — Patient Instructions (Signed)
1. Continue all your medications 2. Continue physical therapy at home 3. If symptoms change, we will proceed with CT scan of brain 4. Follow-up in 1 year, call for any changes

## 2017-05-04 ENCOUNTER — Ambulatory Visit (INDEPENDENT_AMBULATORY_CARE_PROVIDER_SITE_OTHER): Payer: Medicare Other | Admitting: Pharmacist

## 2017-05-04 DIAGNOSIS — I4891 Unspecified atrial fibrillation: Secondary | ICD-10-CM | POA: Diagnosis not present

## 2017-05-04 DIAGNOSIS — Z5181 Encounter for therapeutic drug level monitoring: Secondary | ICD-10-CM | POA: Diagnosis not present

## 2017-05-04 LAB — POCT INR: INR: 1.9

## 2017-05-11 DIAGNOSIS — E782 Mixed hyperlipidemia: Secondary | ICD-10-CM | POA: Diagnosis not present

## 2017-05-11 DIAGNOSIS — E039 Hypothyroidism, unspecified: Secondary | ICD-10-CM | POA: Diagnosis not present

## 2017-05-15 ENCOUNTER — Other Ambulatory Visit: Payer: Self-pay | Admitting: Internal Medicine

## 2017-05-18 ENCOUNTER — Telehealth: Payer: Self-pay | Admitting: Internal Medicine

## 2017-05-18 DIAGNOSIS — F0390 Unspecified dementia without behavioral disturbance: Secondary | ICD-10-CM | POA: Diagnosis not present

## 2017-05-18 DIAGNOSIS — I480 Paroxysmal atrial fibrillation: Secondary | ICD-10-CM | POA: Diagnosis not present

## 2017-05-18 DIAGNOSIS — E039 Hypothyroidism, unspecified: Secondary | ICD-10-CM | POA: Diagnosis not present

## 2017-05-18 DIAGNOSIS — R410 Disorientation, unspecified: Secondary | ICD-10-CM | POA: Diagnosis not present

## 2017-05-18 NOTE — Telephone Encounter (Signed)
New Message     Pt c/o medication issue:  1. Name of Medication:  warafrin   4. What is your medication issue?  Needs to change the medication manufacture, is it okay with you that they change it ?

## 2017-05-19 DIAGNOSIS — N390 Urinary tract infection, site not specified: Secondary | ICD-10-CM | POA: Diagnosis not present

## 2017-05-19 NOTE — Telephone Encounter (Signed)
Returned a call to the Pharmacy &  they are having to change Coumadin/Warfarin manufacturers, advised that the change is fine to be made & will document on the pt's next appt, too. The Pharmacist verbalized understanding & note made on next appt

## 2017-05-25 ENCOUNTER — Ambulatory Visit (INDEPENDENT_AMBULATORY_CARE_PROVIDER_SITE_OTHER): Payer: Medicare Other | Admitting: *Deleted

## 2017-05-25 DIAGNOSIS — I4891 Unspecified atrial fibrillation: Secondary | ICD-10-CM | POA: Diagnosis not present

## 2017-05-25 DIAGNOSIS — Z5181 Encounter for therapeutic drug level monitoring: Secondary | ICD-10-CM | POA: Diagnosis not present

## 2017-05-25 LAB — POCT INR: INR: 3.4

## 2017-06-04 ENCOUNTER — Encounter (HOSPITAL_COMMUNITY): Payer: Self-pay

## 2017-06-04 ENCOUNTER — Inpatient Hospital Stay (HOSPITAL_COMMUNITY)
Admission: EM | Admit: 2017-06-04 | Discharge: 2017-06-08 | DRG: 682 | Disposition: A | Discharge status: Skilled Nursing Facility | Payer: Medicare Other | Attending: Internal Medicine | Admitting: Internal Medicine

## 2017-06-04 ENCOUNTER — Emergency Department (HOSPITAL_COMMUNITY): Payer: Medicare Other

## 2017-06-04 DIAGNOSIS — I10 Essential (primary) hypertension: Secondary | ICD-10-CM | POA: Diagnosis not present

## 2017-06-04 DIAGNOSIS — I6930 Unspecified sequelae of cerebral infarction: Secondary | ICD-10-CM | POA: Diagnosis not present

## 2017-06-04 DIAGNOSIS — Z7901 Long term (current) use of anticoagulants: Secondary | ICD-10-CM

## 2017-06-04 DIAGNOSIS — R338 Other retention of urine: Secondary | ICD-10-CM | POA: Diagnosis not present

## 2017-06-04 DIAGNOSIS — Z950 Presence of cardiac pacemaker: Secondary | ICD-10-CM | POA: Diagnosis not present

## 2017-06-04 DIAGNOSIS — G4700 Insomnia, unspecified: Secondary | ICD-10-CM | POA: Diagnosis present

## 2017-06-04 DIAGNOSIS — Z66 Do not resuscitate: Secondary | ICD-10-CM | POA: Diagnosis present

## 2017-06-04 DIAGNOSIS — N138 Other obstructive and reflux uropathy: Secondary | ICD-10-CM | POA: Diagnosis present

## 2017-06-04 DIAGNOSIS — N179 Acute kidney failure, unspecified: Principal | ICD-10-CM | POA: Diagnosis present

## 2017-06-04 DIAGNOSIS — G9340 Encephalopathy, unspecified: Secondary | ICD-10-CM | POA: Diagnosis not present

## 2017-06-04 DIAGNOSIS — G629 Polyneuropathy, unspecified: Secondary | ICD-10-CM | POA: Diagnosis present

## 2017-06-04 DIAGNOSIS — E039 Hypothyroidism, unspecified: Secondary | ICD-10-CM | POA: Diagnosis not present

## 2017-06-04 DIAGNOSIS — F0390 Unspecified dementia without behavioral disturbance: Secondary | ICD-10-CM | POA: Diagnosis present

## 2017-06-04 DIAGNOSIS — R339 Retention of urine, unspecified: Secondary | ICD-10-CM | POA: Diagnosis not present

## 2017-06-04 DIAGNOSIS — R52 Pain, unspecified: Secondary | ICD-10-CM | POA: Diagnosis not present

## 2017-06-04 DIAGNOSIS — R05 Cough: Secondary | ICD-10-CM

## 2017-06-04 DIAGNOSIS — I69391 Dysphagia following cerebral infarction: Secondary | ICD-10-CM | POA: Diagnosis not present

## 2017-06-04 DIAGNOSIS — E569 Vitamin deficiency, unspecified: Secondary | ICD-10-CM | POA: Diagnosis not present

## 2017-06-04 DIAGNOSIS — Z823 Family history of stroke: Secondary | ICD-10-CM | POA: Diagnosis not present

## 2017-06-04 DIAGNOSIS — M6281 Muscle weakness (generalized): Secondary | ICD-10-CM | POA: Diagnosis not present

## 2017-06-04 DIAGNOSIS — N400 Enlarged prostate without lower urinary tract symptoms: Secondary | ICD-10-CM | POA: Diagnosis present

## 2017-06-04 DIAGNOSIS — R2689 Other abnormalities of gait and mobility: Secondary | ICD-10-CM | POA: Diagnosis not present

## 2017-06-04 DIAGNOSIS — I4891 Unspecified atrial fibrillation: Secondary | ICD-10-CM | POA: Diagnosis not present

## 2017-06-04 DIAGNOSIS — R55 Syncope and collapse: Secondary | ICD-10-CM | POA: Diagnosis not present

## 2017-06-04 DIAGNOSIS — R0602 Shortness of breath: Secondary | ICD-10-CM | POA: Diagnosis not present

## 2017-06-04 DIAGNOSIS — H04129 Dry eye syndrome of unspecified lacrimal gland: Secondary | ICD-10-CM | POA: Diagnosis not present

## 2017-06-04 DIAGNOSIS — Z87891 Personal history of nicotine dependence: Secondary | ICD-10-CM

## 2017-06-04 DIAGNOSIS — F419 Anxiety disorder, unspecified: Secondary | ICD-10-CM | POA: Diagnosis not present

## 2017-06-04 DIAGNOSIS — F411 Generalized anxiety disorder: Secondary | ICD-10-CM | POA: Diagnosis not present

## 2017-06-04 DIAGNOSIS — R7881 Bacteremia: Secondary | ICD-10-CM | POA: Diagnosis not present

## 2017-06-04 DIAGNOSIS — F0151 Vascular dementia with behavioral disturbance: Secondary | ICD-10-CM | POA: Diagnosis not present

## 2017-06-04 DIAGNOSIS — R262 Difficulty in walking, not elsewhere classified: Secondary | ICD-10-CM | POA: Diagnosis not present

## 2017-06-04 DIAGNOSIS — R278 Other lack of coordination: Secondary | ICD-10-CM | POA: Diagnosis not present

## 2017-06-04 DIAGNOSIS — E785 Hyperlipidemia, unspecified: Secondary | ICD-10-CM | POA: Diagnosis not present

## 2017-06-04 DIAGNOSIS — G9341 Metabolic encephalopathy: Secondary | ICD-10-CM | POA: Diagnosis present

## 2017-06-04 DIAGNOSIS — R001 Bradycardia, unspecified: Secondary | ICD-10-CM | POA: Diagnosis present

## 2017-06-04 DIAGNOSIS — K5900 Constipation, unspecified: Secondary | ICD-10-CM | POA: Diagnosis not present

## 2017-06-04 DIAGNOSIS — K7682 Hepatic encephalopathy: Secondary | ICD-10-CM | POA: Diagnosis present

## 2017-06-04 DIAGNOSIS — R4182 Altered mental status, unspecified: Secondary | ICD-10-CM | POA: Diagnosis not present

## 2017-06-04 DIAGNOSIS — N401 Enlarged prostate with lower urinary tract symptoms: Secondary | ICD-10-CM | POA: Diagnosis not present

## 2017-06-04 DIAGNOSIS — I119 Hypertensive heart disease without heart failure: Secondary | ICD-10-CM | POA: Diagnosis not present

## 2017-06-04 DIAGNOSIS — G0490 Encephalitis and encephalomyelitis, unspecified: Secondary | ICD-10-CM | POA: Diagnosis not present

## 2017-06-04 DIAGNOSIS — R404 Transient alteration of awareness: Secondary | ICD-10-CM | POA: Diagnosis not present

## 2017-06-04 DIAGNOSIS — K7200 Acute and subacute hepatic failure without coma: Secondary | ICD-10-CM | POA: Diagnosis present

## 2017-06-04 DIAGNOSIS — R410 Disorientation, unspecified: Secondary | ICD-10-CM | POA: Diagnosis not present

## 2017-06-04 DIAGNOSIS — R059 Cough, unspecified: Secondary | ICD-10-CM

## 2017-06-04 DIAGNOSIS — R1312 Dysphagia, oropharyngeal phase: Secondary | ICD-10-CM | POA: Diagnosis not present

## 2017-06-04 DIAGNOSIS — S329XXA Fracture of unspecified parts of lumbosacral spine and pelvis, initial encounter for closed fracture: Secondary | ICD-10-CM | POA: Diagnosis not present

## 2017-06-04 DIAGNOSIS — G0430 Acute necrotizing hemorrhagic encephalopathy, unspecified: Secondary | ICD-10-CM | POA: Diagnosis not present

## 2017-06-04 LAB — COMPREHENSIVE METABOLIC PANEL
ALBUMIN: 3.8 g/dL (ref 3.5–5.0)
ALK PHOS: 74 U/L (ref 38–126)
ALK PHOS: 74 U/L (ref 38–126)
ALT: 21 U/L (ref 17–63)
ALT: 23 U/L (ref 17–63)
AST: 32 U/L (ref 15–41)
AST: 36 U/L (ref 15–41)
Albumin: 4 g/dL (ref 3.5–5.0)
Anion gap: 12 (ref 5–15)
Anion gap: 7 (ref 5–15)
BILIRUBIN TOTAL: 0.7 mg/dL (ref 0.3–1.2)
BUN: 45 mg/dL — AB (ref 6–20)
BUN: 46 mg/dL — ABNORMAL HIGH (ref 6–20)
CALCIUM: 8.9 mg/dL (ref 8.9–10.3)
CALCIUM: 8.6 mg/dL — AB (ref 8.9–10.3)
CHLORIDE: 107 mmol/L (ref 101–111)
CO2: 18 mmol/L — AB (ref 22–32)
CO2: 23 mmol/L (ref 22–32)
CREATININE: 2.64 mg/dL — AB (ref 0.61–1.24)
CREATININE: 2.57 mg/dL — AB (ref 0.61–1.24)
Chloride: 105 mmol/L (ref 101–111)
GFR calc Af Amer: 24 mL/min — ABNORMAL LOW (ref >60)
GFR, EST AFRICAN AMERICAN: 23 mL/min — AB (ref >60)
GFR, EST NON AFRICAN AMERICAN: 20 mL/min — AB (ref >60)
GFR, EST NON AFRICAN AMERICAN: 20 mL/min — AB (ref >60)
GLUCOSE: 86 mg/dL (ref 65–99)
Glucose, Bld: 105 mg/dL — ABNORMAL HIGH (ref 65–99)
Potassium: 4.8 mmol/L (ref 3.5–5.1)
Potassium: 5.5 mmol/L — ABNORMAL HIGH (ref 3.5–5.1)
Sodium: 137 mmol/L (ref 135–145)
Sodium: 135 mmol/L (ref 135–145)
TOTAL PROTEIN: 6.7 g/dL (ref 6.5–8.1)
Total Bilirubin: 0.5 mg/dL (ref 0.3–1.2)
Total Protein: 7 g/dL (ref 6.5–8.1)

## 2017-06-04 LAB — PROTIME-INR
INR: 2.67
Prothrombin Time: 29 seconds — ABNORMAL HIGH (ref 11.4–15.2)

## 2017-06-04 LAB — CBC WITH DIFFERENTIAL/PLATELET
Basophils Absolute: 0.1 10*3/uL (ref 0.0–0.1)
Basophils Relative: 2 %
Eosinophils Absolute: 0.2 10*3/uL (ref 0.0–0.7)
Eosinophils Relative: 4 %
HEMATOCRIT: 31.5 % — AB (ref 39.0–52.0)
HEMOGLOBIN: 10.1 g/dL — AB (ref 13.0–17.0)
LYMPHS ABS: 1.5 10*3/uL (ref 0.7–4.0)
Lymphocytes Relative: 27 %
MCH: 31.2 pg (ref 26.0–34.0)
MCHC: 32.1 g/dL (ref 30.0–36.0)
MCV: 97.2 fL (ref 78.0–100.0)
MONO ABS: 0.5 10*3/uL (ref 0.1–1.0)
MONOS PCT: 9 %
NEUTROS ABS: 3.2 10*3/uL (ref 1.7–7.7)
NEUTROS PCT: 58 %
Platelets: 257 10*3/uL (ref 150–400)
RBC: 3.24 MIL/uL — ABNORMAL LOW (ref 4.22–5.81)
RDW: 14.2 % (ref 11.5–15.5)
WBC: 5.5 10*3/uL (ref 4.0–10.5)

## 2017-06-04 LAB — PHOSPHORUS: Phosphorus: 4.1 mg/dL (ref 2.5–4.6)

## 2017-06-04 LAB — TROPONIN I

## 2017-06-04 LAB — URINALYSIS, ROUTINE W REFLEX MICROSCOPIC
BILIRUBIN URINE: NEGATIVE
GLUCOSE, UA: NEGATIVE mg/dL
HGB URINE DIPSTICK: NEGATIVE
Ketones, ur: NEGATIVE mg/dL
Leukocytes, UA: NEGATIVE
Nitrite: NEGATIVE
PROTEIN: NEGATIVE mg/dL
Specific Gravity, Urine: 1.011 (ref 1.005–1.030)
pH: 6 (ref 5.0–8.0)

## 2017-06-04 LAB — BRAIN NATRIURETIC PEPTIDE: B Natriuretic Peptide: 451.8 pg/mL — ABNORMAL HIGH (ref 0.0–100.0)

## 2017-06-04 LAB — AMMONIA: AMMONIA: 14 umol/L (ref 9–35)

## 2017-06-04 LAB — MAGNESIUM: MAGNESIUM: 2.3 mg/dL (ref 1.7–2.4)

## 2017-06-04 MED ORDER — WARFARIN SODIUM 2.5 MG PO TABS
2.5000 mg | ORAL_TABLET | Freq: Once | ORAL | Status: AC
  Administered 2017-06-04: 2.5 mg via ORAL
  Filled 2017-06-04: qty 1

## 2017-06-04 MED ORDER — WARFARIN - PHARMACIST DOSING INPATIENT
Freq: Every day | Status: DC
  Administered 2017-06-06 – 2017-06-07 (×2)

## 2017-06-04 MED ORDER — SODIUM CHLORIDE 0.9 % IV SOLN
INTRAVENOUS | Status: AC
  Administered 2017-06-04 – 2017-06-05 (×2): via INTRAVENOUS

## 2017-06-04 MED ORDER — SENNOSIDES-DOCUSATE SODIUM 8.6-50 MG PO TABS
1.0000 | ORAL_TABLET | Freq: Every evening | ORAL | Status: DC | PRN
  Administered 2017-06-05: 1 via ORAL
  Filled 2017-06-04: qty 1

## 2017-06-04 MED ORDER — ASPIRIN 81 MG PO CHEW
324.0000 mg | CHEWABLE_TABLET | Freq: Once | ORAL | Status: AC
  Administered 2017-06-04: 324 mg via ORAL
  Filled 2017-06-04: qty 4

## 2017-06-04 MED ORDER — SODIUM CHLORIDE 0.9% FLUSH
3.0000 mL | Freq: Two times a day (BID) | INTRAVENOUS | Status: DC
  Administered 2017-06-05 – 2017-06-08 (×5): 3 mL via INTRAVENOUS

## 2017-06-04 MED ORDER — AMIODARONE HCL 200 MG PO TABS
100.0000 mg | ORAL_TABLET | Freq: Every day | ORAL | Status: DC
  Administered 2017-06-05 – 2017-06-08 (×4): 100 mg via ORAL
  Filled 2017-06-04 (×4): qty 1

## 2017-06-04 MED ORDER — ACETAMINOPHEN 325 MG PO TABS
650.0000 mg | ORAL_TABLET | Freq: Four times a day (QID) | ORAL | Status: DC | PRN
  Administered 2017-06-07: 650 mg via ORAL
  Filled 2017-06-04: qty 2

## 2017-06-04 MED ORDER — ONDANSETRON HCL 4 MG/2ML IJ SOLN
4.0000 mg | Freq: Four times a day (QID) | INTRAMUSCULAR | Status: DC | PRN
Start: 2017-06-04 — End: 2017-06-08

## 2017-06-04 MED ORDER — TAMSULOSIN HCL 0.4 MG PO CAPS
0.4000 mg | ORAL_CAPSULE | Freq: Every day | ORAL | Status: DC
  Administered 2017-06-04 – 2017-06-05 (×2): 0.4 mg via ORAL
  Filled 2017-06-04 (×2): qty 1

## 2017-06-04 MED ORDER — LEVOTHYROXINE SODIUM 25 MCG PO TABS
25.0000 ug | ORAL_TABLET | Freq: Every day | ORAL | Status: DC
  Administered 2017-06-05 – 2017-06-07 (×3): 25 ug via ORAL
  Filled 2017-06-04 (×3): qty 1

## 2017-06-04 MED ORDER — ONDANSETRON HCL 4 MG PO TABS: 4.0000 mg | ORAL_TABLET | Freq: Four times a day (QID) | ORAL | Status: DC | PRN

## 2017-06-04 MED ORDER — ACETAMINOPHEN 650 MG RE SUPP: 650.0000 mg | Freq: Four times a day (QID) | RECTAL | Status: DC | PRN

## 2017-06-04 MED ORDER — SODIUM CHLORIDE 0.9 % IV BOLUS (SEPSIS)
1000.0000 mL | Freq: Once | INTRAVENOUS | Status: AC
  Administered 2017-06-04: 1000 mL via INTRAVENOUS

## 2017-06-04 NOTE — Progress Notes (Signed)
ANTICOAGULATION CONSULT NOTE - Initial Consult  Pharmacy Consult for warfarin Indication: atrial fibrillation  Allergies  Allergen Reactions  . Guaifenesin Er Palpitations    Pt can not take mucinex PM  . Prednisone Other (See Comments)    HIGH doses make patient feel "crazy"    Patient Measurements: Height: 5\' 9"  (175.3 cm) Weight: 160 lb (72.6 kg) IBW/kg (Calculated) : 70.7  Vital Signs: Temp: 97.2 F (36.2 C) (07/05 1305) Temp Source: Oral (07/05 1305) BP: 172/82 (07/05 1515) Pulse Rate: 59 (07/05 1515)  Labs:  Recent Labs  06/04/17 1315  HGB 10.1*  HCT 31.5*  PLT 257  LABPROT 29.0*  INR 2.67  CREATININE 2.64*  TROPONINI <0.03    Estimated Creatinine Clearance: 18.6 mL/min (A) (by C-G formula based on SCr of 2.64 mg/dL (H)).   Medical History: Past Medical History:  Diagnosis Date  . Ataxia    followed by Dr. Erling Cruz  . Atrial fibrillation (HCC)    coumadin Rx  . BPH (benign prostatic hyperplasia)   . Hyperlipidemia   . Hypertension   . Hypothyroidism   . Peripheral neuropathy   . Sick sinus syndrome (HCC)    s/p PPM  . TIA (transient ischemic attack)    carotid dopplers 5/11: 0-39% bilateral; follow up due 03/2012   Assessment: 65 yom presented to the ED with AMS. He is on chronic coumadin for history of afib. INR is therapeutic at 2.67. H/H is slightly low and platelets are WNL. No bleeding noted. Of note, pt is confused and unable to answer questions about his home medications currently.   Goal of Therapy:  INR 2-3 Monitor platelets by anticoagulation protocol: Yes   Plan:  Warfarin 2.5mg  PO x 1 tonight Daily INR  Leara Rawl, [name] 06/04/2017,4:06 PM

## 2017-06-04 NOTE — ED Notes (Signed)
EDP gave verbal order to place foley.

## 2017-06-04 NOTE — Progress Notes (Addendum)
wwwwReport received from the ED at 1620. Pt arrived to the unit via bed at 1645.

## 2017-06-04 NOTE — Progress Notes (Addendum)
[name] is a 83 y.o. male patient admitted from ED awake, alert - oriented  X 3- no acute distress occlusive dsg intact without redness.  Orientation to room, and floor completed with information packet given to patient/family.  Patient declined safety video at this time.  Admission INP armband ID verified with patient/family, and in place.   SR up x 2, fall assessment complete, with patient and family able to verbalize understanding of risk associated with falls, and verbalized understanding to call nsg before up out of bed.  Call light within reach, patient able to voice, and demonstrate understanding.  Skin, clean-dry- intact with some evidence of generalize bruising. No evidence of skin break down noted on exam.   Will cont to eval and treat per MD orders.  Dorris Carnes, RN 06/04/2017 6:25 PM

## 2017-06-04 NOTE — H&P (Signed)
[facility] History and Physical  Zylen Wenig KPT:[id number] DOB: 1924-09-03 DOA: 06/04/2017  Referring physician:  PCP: Merrilee Seashore, MD   Chief Complaint: unresponsive  HPI: [name] is a 83 y.o. male with past medical history significant for ataxia, A. fib, BPH, hypertension, hypothyroid, neuropathy, sick sinus and TIA principal hospital with chief complaint of unresponsiveness. Patient has long history of dementia. He also is known to sleep with his eyes open. Patient is family became alarmed when they tried to wake him up and he would awaken. They had difficulty perineorrhaphy was asleep or not due to his eyes being open. After he began to converse 8 the felt that he was having trouble having a fluid conversation with them. This alarmed them and they had a brought to the emergency room for evaluation.   Review of Systems:  As per HPI otherwise 10 point review of systems negative.    Past Medical History:  Diagnosis Date  . Ataxia    followed by Dr. Erling Cruz  . Atrial fibrillation (HCC)    coumadin Rx  . BPH (benign prostatic hyperplasia)   . Hyperlipidemia   . Hypertension   . Hypothyroidism   . Peripheral neuropathy   . Sick sinus syndrome (HCC)    s/p PPM  . TIA (transient ischemic attack)    carotid dopplers 5/11: 0-39% bilateral; follow up due 03/2012   Past Surgical History:  Procedure Laterality Date  . PACEMAKER INSERTION     implanted 10/96, most recent Gen Change by Dr Olevia Perches 8/08  . TEE WITHOUT CARDIOVERSION N/A 05/29/2016   Procedure: TRANSESOPHAGEAL ECHOCARDIOGRAM (TEE);  Surgeon: Larey Dresser, MD;  Location: [place];  Service: Cardiovascular;  Laterality: N/A;   Social History:  reports that he quit smoking about 39 years ago. He has never used smokeless tobacco. He reports that he does not drink alcohol or use drugs.  Allergies  Allergen Reactions  . Guaifenesin Er Palpitations    Pt can not take mucinex PM  . Prednisone Other  (See Comments)    HIGH doses make patient feel "crazy"    Family History  Problem Relation Age of Onset  . Stroke Mother      Prior to Admission medications   Medication Sig Start Date End Date Taking? Authorizing Provider  amiodarone (PACERONE) 200 MG tablet Take 0.5 tablets (100 mg total) by mouth daily. 03/16/17   Allred, Jeneen Rinks, MD  levothyroxine (SYNTHROID, LEVOTHROID) 25 MCG tablet Take 25 mcg by mouth daily before breakfast.    [provider]  warfarin (COUMADIN) 5 MG tablet Take 0.5-1 tablets (2.5-5 mg total) by mouth as directed. Take 2.5 mg (0.5 tablet) on Tues, Thurs, Sat and Sun and Take 5 mg (1 tablet) on Mon, Wed and Fri 06/02/16   Vernell Leep D, MD  warfarin (COUMADIN) 5 MG tablet TAKE ONE TABLET BY MOUTH ONCE DAILY AS DIRECTED 05/15/17   Thompson Grayer, MD   Physical Exam: Vitals:   06/04/17 1330 06/04/17 1400 06/04/17 1445 06/04/17 1515  BP: 127/76 102/71 (!) 152/86 (!) 172/82  Pulse: (!) 59 (!) 59 (!) 58 (!) 59  Resp: 16 18 15 13   Temp:      TempSrc:      SpO2: 100% 98% 100% 98%  Weight:      Height:        Wt Readings from Last 3 Encounters:  06/04/17 72.6 kg (160 lb)  04/20/17 72.6 kg (160 lb)  03/16/17 72.6 kg (160 lb 2 oz)  General:  Appears calm and comfortable; A&Ox1, pt has trouble with memory moment to momment, will forget what he asked you what you said mid conversation Eyes:  PERRL, EOMI, normal lids, iris ENT:  grossly normal hearing, lips & tongue Neck:  no LAD, masses or thyromegaly Cardiovascular:  RRR, no m/r/g. No LE edema.  Respiratory:  CTA bilaterally, no w/r/r. Normal respiratory effort. Abdomen:  soft, distended and tense, nontender Skin:  no rash or induration seen on limited exam Musculoskeletal:  grossly normal tone BUE/BLE Psychiatric:  grossly normal mood and affect, speech fluent and appropriate Neurologic:  CN 2-12 grossly intact, moves all extremities in coordinated fashion.          Labs on Admission:  Basic  Metabolic Panel:  Recent Labs Lab 06/04/17 1315  NA 137  K 4.8  CL 107  CO2 18*  GLUCOSE 105*  BUN 45*  CREATININE 2.64*  CALCIUM 8.9  MG 2.3   Liver Function Tests:  Recent Labs Lab 06/04/17 1315  AST 32  ALT 21  ALKPHOS 74  BILITOT 0.5  PROT 7.0  ALBUMIN 4.0   No results for input(s): LIPASE, AMYLASE in the last 168 hours. No results for input(s): AMMONIA in the last 168 hours. CBC:  Recent Labs Lab 06/04/17 1315  WBC 5.5  NEUTROABS 3.2  HGB 10.1*  HCT 31.5*  MCV 97.2  PLT 257   Cardiac Enzymes:  Recent Labs Lab 06/04/17 1315  TROPONINI <0.03    BNP (last 3 results)  Recent Labs  06/04/17 1315  BNP 451.8*    ProBNP (last 3 results) No results for input(s): PROBNP in the last 8760 hours.   Serum creatinine: 2.64 mg/dL (H) 06/04/17 1315 Estimated creatinine clearance: 18.6 mL/min (A)  CBG: No results for input(s): GLUCAP in the last 168 hours.  Radiological Exams on Admission: Ct Head Wo Contrast  Result Date: 06/04/2017 CLINICAL DATA:  Confusion.  Possible syncope. EXAM: CT HEAD WITHOUT CONTRAST TECHNIQUE: Contiguous axial images were obtained from the base of the skull through the vertex without intravenous contrast. COMPARISON:  05/22/2016 FINDINGS: Brain: No evidence of acute infarction, hemorrhage, hydrocephalus, extra-axial collection or mass lesion/mass effect. Advanced chronic small vessel ischemia with confluent white matter gliosis and remote lacunar infarcts in the right centrum semiovale and right subcortical frontal parietal white matter. Remote small cortical infarct involving the parasagittal right occipital lobe. Small to moderate left larger than right cerebellar infarcts. Vascular: Atherosclerotic calcification.  No hyperdense vessel. Skull: No acute or aggressive finding. Sinuses/Orbits: Cataract resections and enophthalmos. IMPRESSION: 1. No acute finding or change from 2017. 2. Chronic small vessel ischemia with remote  bilateral cerebellar and right occipital infarcts. Electronically Signed   By: Monte Fantasia M.D.   On: 06/04/2017 15:14   Dg Chest Portable 1 View  Result Date: 06/04/2017 CLINICAL DATA:  Unresponsive. EXAM: PORTABLE CHEST 1 VIEW COMPARISON:  05/23/2016. FINDINGS: Cardiac pacer with lead tips in right atrium right ventricle. Cardiomegaly with normal pulmonary vascularity. No focal infiltrate. Low lung volumes with mild basilar atelectasis. Punctate calcific density noted over the left lung consistent granuloma . No pleural effusion or pneumothorax. Calcified nodular densities in the left mid lung. Degenerative changes thoracic spine . IMPRESSION: 1. Cardiac pacer with lead tips in the right atrium and the right ventricle. 2. Cardiomegaly . Electronically Signed   By: Marcello Moores  Register   On: 06/04/2017 13:15    EKG: Independently reviewed. No STEMI. Anterior lateral T-wave inversions are new.  Assessment/Plan Principal Problem:  Acute hepatic encephalopathy Active Problems:   Hypothyroidism   Hyperlipidemia   Anxiety state   HYPERTENSION, BENIGN ESSENTIAL   ATRIAL FIBRILLATION   Bradycardia   BPH (benign prostatic hyperplasia)   INSOMNIA-SLEEP DISORDER-UNSPEC   Acute encephalopathy Checking urinalysis Chest x-ray one view negative CT head negative Medications-no STEMI medication Consider MRI tomorrow Check ammonia  AKI Baseline Cr 1.9, Cr on admit 2.64 IVF ordered Gentle hydration overnight Checking magnesium and phosphorus Urine labs ordered to calculate fractional excretion of urea  EKG changes Initial troponin negative Will trend BNP Echo tomorrow  Hypothyroidism Cont OP synthroid 25 g Synthroid mcg qd No signs of hyper or hypothyroidism  Low temp Pt mouth breather. No other signs of SIRS. Rectal temp  Hyperlipidemia Continue statin  Atrial fibrillation Continue Pacerone Cont Coumadin  Bradycardia We'll monitor, chronic  BPH Patient with bladder  retention up to 850 mL Start Flomax May need Foley  Insomnia We'll monitor Will avoid sedating medications for now  Code Status: FULL  DVT Prophylaxis: warfarin Family Communication:  Disposition Plan: Pending Improvement  Status: Inpt tele, consider downgrading patient to MedSurg tomorrow  Elwin Mocha, MD Family Medicine [facility] [email or web address] Password [email or web address]

## 2017-06-04 NOTE — ED Triage Notes (Signed)
Pt. Here from home via [facility] for periods of unresponsiveness. Pt. Lives at home with wife. Pt. Alert and oriented to place and person, but not time. Pt. Hx of pacemaker. Wife denies any falls. Repetitive questioning en route. Pt. States he falls asleep really easy.

## 2017-06-04 NOTE — ED Provider Notes (Addendum)
[facility] DEPT Provider Note   CSN: [id number] Arrival date & time: 06/04/17  1251     History   Chief Complaint Chief Complaint  Patient presents with  . Altered Mental Status    HPI [name] is a 83 y.o. male.  HPI  Patient presents due to family concerns of unresponsiveness. The patient self is awake and alert, is unsure why he's here for evaluation. He denies pain, lightheadedness, confusion, vision changes, dyspnea or any other focal changes from his baseline. However, family members called EMS due to the patient having prolonged period of unresponsiveness, and it was unclear if the patient was sleeping or they were unable to awaken him or he had an episode of syncope. Level V caveat secondary to confusion, patient thinks the president is Maudie Flakes, is disoriented.  Past Medical History:  Diagnosis Date  . Ataxia    followed by Dr. Erling Cruz  . Atrial fibrillation (HCC)    coumadin Rx  . BPH (benign prostatic hyperplasia)   . Hyperlipidemia   . Hypertension   . Hypothyroidism   . Peripheral neuropathy   . Sick sinus syndrome (HCC)    s/p PPM  . TIA (transient ischemic attack)    carotid dopplers 5/11: 0-39% bilateral; follow up due 03/2012    Patient Active Problem List   Diagnosis Date Noted  . Moderate dementia without behavioral disturbance 04/20/2017  . Fever   . Discitis of lumbar region   . Enterococcal bacteremia 05/24/2016  . Acute encephalopathy 05/24/2016  . Compression fracture of L2 lumbar vertebra with delayed healing 05/23/2016  . Elevated C-reactive protein (CRP) 05/22/2016  . Altered mental status 05/22/2016  . Right hip pain 03/17/2016  . Encounter for therapeutic drug monitoring 01/05/2014  . Gait difficulty 11/22/2013  . Chronic ischemic right PCA stroke 11/22/2013  . 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 PERIPHERAL NEUROPATHY 07/18/2010  . ABRASION/FRICION BURN OTH MX&UNS SITE W/O INF 09/25/2009  . PACEMAKER,  PERMANENT 09/25/2009  . PREMATURE VENTRICULAR CONTRACTIONS 02/23/2009  . BRADYCARDIA 02/23/2009  . ATAXIA 02/23/2009  . ANXIETY 08/03/2008  . BPH (benign prostatic hyperplasia) 08/03/2008  . INSOMNIA-SLEEP DISORDER-UNSPEC 08/03/2008  . Hypothyroidism 10/26/2007  . PERIPHERAL VASCULAR DISEASE 10/26/2007  . ALLERGIC RHINITIS 10/26/2007  . HYPERTENSION, BENIGN ESSENTIAL 10/25/2007  . ATRIAL FIBRILLATION 08/26/2007  . Hyperlipidemia 08/23/2007  . SICK SINUS SYNDROME 08/23/2007  . CAROTID ARTERY STENOSIS 08/23/2007    Past Surgical History:  Procedure Laterality Date  . PACEMAKER INSERTION     implanted 10/96, most recent Gen Change by Dr Olevia Perches 8/08  . TEE WITHOUT CARDIOVERSION N/A 05/29/2016   Procedure: TRANSESOPHAGEAL ECHOCARDIOGRAM (TEE);  Surgeon: Larey Dresser, MD;  Location: [facility];  Service: Cardiovascular;  Laterality: N/A;       Home Medications    Prior to Admission medications   Medication Sig Start Date End Date Taking? Authorizing Provider  amiodarone (PACERONE) 200 MG tablet Take 0.5 tablets (100 mg total) by mouth daily. 03/16/17   Allred, Jeneen Rinks, MD  levothyroxine (SYNTHROID, LEVOTHROID) 25 MCG tablet Take 25 mcg by mouth daily before breakfast.    [provider]  warfarin (COUMADIN) 5 MG tablet Take 0.5-1 tablets (2.5-5 mg total) by mouth as directed. Take 2.5 mg (0.5 tablet) on Tues, Thurs, Sat and Sun and Take 5 mg (1 tablet) on Mon, Wed and Fri 06/02/16   Vernell Leep D, MD  warfarin (COUMADIN) 5 MG tablet TAKE ONE TABLET BY MOUTH ONCE DAILY AS DIRECTED  05/15/17   Thompson Grayer, MD    Family History Family History  Problem Relation Age of Onset  . Stroke Mother     Social History Social History  Substance Use Topics  . Smoking status: Former Smoker    Quit date: 12/01/1977  . Smokeless tobacco: Never Used  . Alcohol use No     Comment: quit in 1995     Allergies   Guaifenesin er and Prednisone   Review of Systems Review of  Systems  Unable to perform ROS: Mental status change     Physical Exam Updated Vital Signs BP 130/69   Pulse (!) 59   Temp (!) 97.2 F (36.2 C) (Oral)   Resp 18   Ht 5\' 9"  (1.753 m)   Wt 72.6 kg (160 lb)   SpO2 98%   BMI 23.63 kg/m   Physical Exam  Constitutional: He appears well-developed. No distress.  HENT:  Head: Normocephalic and atraumatic.  Eyes: Conjunctivae and EOM are normal.  Cardiovascular: Normal rate and regular rhythm.   Pacemaker palpable upper chest, unremarkable  Pulmonary/Chest: Effort normal. No stridor. No respiratory distress.  Abdominal: He exhibits no distension.  Musculoskeletal: He exhibits no edema.  Neurological: He is alert. He displays atrophy. He displays no tremor. No cranial nerve deficit. He exhibits normal muscle tone. He displays no seizure activity.  Patient oriented to self, roughly to place, not to time  Skin: Skin is warm and dry.  Psychiatric: He has a normal mood and affect.  Nursing note and vitals reviewed.    ED Treatments / Results  Labs (all labs ordered are listed, but only abnormal results are displayed) Labs Reviewed  COMPREHENSIVE METABOLIC PANEL - Abnormal; Notable for the following:       Result Value   CO2 18 (*)    Glucose, Bld 105 (*)    BUN 45 (*)    Creatinine, Ser 2.64 (*)    GFR calc non Af Amer 20 (*)    GFR calc Af Amer 23 (*)    All other components within normal limits  BRAIN NATRIURETIC PEPTIDE - Abnormal; Notable for the following:    B Natriuretic Peptide 451.8 (*)    All other components within normal limits  CBC WITH DIFFERENTIAL/PLATELET - Abnormal; Notable for the following:    RBC 3.24 (*)    Hemoglobin 10.1 (*)    HCT 31.5 (*)    All other components within normal limits  PROTIME-INR - Abnormal; Notable for the following:    Prothrombin Time 29.0 (*)    All other components within normal limits  MAGNESIUM  TROPONIN I  CBG MONITORING, ED    EKG  EKG  Interpretation  Date/Time:  Thursday June 04 2017 13:03:15 EDT Ventricular Rate:  60 PR Interval:    QRS Duration: 107 QT Interval:  586 QTC Calculation: 586 R Axis:   20 Text Interpretation:  ATRIAL PACED RHYTHM Abnormal ekg Confirmed by Carmin Muskrat [phone]) on 06/04/2017 1:53:59 PM       Radiology Dg Chest Portable 1 View  Result Date: 06/04/2017 CLINICAL DATA:  Unresponsive. EXAM: PORTABLE CHEST 1 VIEW COMPARISON:  05/23/2016. FINDINGS: Cardiac pacer with lead tips in right atrium right ventricle. Cardiomegaly with normal pulmonary vascularity. No focal infiltrate. Low lung volumes with mild basilar atelectasis. Punctate calcific density noted over the left lung consistent granuloma . No pleural effusion or pneumothorax. Calcified nodular densities in the left mid lung. Degenerative changes thoracic spine . IMPRESSION: 1. Cardiac pacer  with lead tips in the right atrium and the right ventricle. 2. Cardiomegaly . Electronically Signed   By: Marcello Moores  Register   On: 06/04/2017 13:15    Procedures Procedures (including critical care time)  Medications Ordered in ED Medications  aspirin chewable tablet 324 mg (not administered)     Initial Impression / Assessment and Plan / ED Course  I have reviewed the triage vital signs and the nursing notes.  Pertinent labs & imaging results that were available during my care of the patient were reviewed by me and considered in my medical decision making (see chart for details).  Echocardiogram from June, 2017, notable for preserved ejection fraction. Here, on repeat exam the patient went in similar condition, disoriented, confused. Labs notable for elevated creatinine, 50% increase over 2 months, as well as elevated BNP  Patient had no abdominal pain, but with no production of urine during his ED stay, bladder scans performed by nursing staff, as was abnormal, with greater than 500 mL of urine present. With this and possible urinary retention  as a consideration for his change from baseline, patient had Foley catheter placed.    Family members are not present, they state the patient has baseline memory loss, but is notably worse over the past 2 days.  It seems as though the patient may have had a syncopal event, rather than just been sleeping, but it is unclear given the inability of the patient himself to provide additional details of the history of present illness.  Given these concerns, aforementioned findings concerning for acute kidney injury, worsening congestive heart failure, patient was admitted for further evaluation and management.  Final Clinical Impressions(s) / ED Diagnoses  Altered mental status Acute kidney injury   Carmin Muskrat, MD 06/04/17 1513    Carmin Muskrat, MD 06/04/17 [phone]

## 2017-06-04 NOTE — Progress Notes (Signed)
Pt was brought in from the the ED in a wet pads, which is an indication that patient urinated in the bed. Bed was fully saturated with urine. This is a bit of concern to me, in that he has an indwelling catheter placed in  the ED shortly before he came to [facility] [place]. Foley care was not done at the ED, due to wet pads, sheets, and a bit of blood stain on the penis.   Call has been placed back to ED to make RN aware of all these.

## 2017-06-05 ENCOUNTER — Inpatient Hospital Stay (HOSPITAL_COMMUNITY): Payer: Medicare Other

## 2017-06-05 DIAGNOSIS — I4891 Unspecified atrial fibrillation: Secondary | ICD-10-CM

## 2017-06-05 DIAGNOSIS — N179 Acute kidney failure, unspecified: Principal | ICD-10-CM

## 2017-06-05 DIAGNOSIS — G9340 Encephalopathy, unspecified: Secondary | ICD-10-CM

## 2017-06-05 LAB — ECHOCARDIOGRAM COMPLETE
AOVTI: 49.3 cm
AV Mean grad: 12 mmHg
AV Peak grad: 22 mmHg
AV vel: 2.53
AVAREAMEANV: 2.18 cm2
AVAREAMEANVIN: 1.17 cm2/m2
AVAREAVTI: 2.33 cm2
AVAREAVTIIND: 1.35 cm2/m2
AVCELMEANRAT: 0.48
AVLVOTPG: 6 mmHg
AVPKVEL: 235 cm/s
Ao pk vel: 0.51 m/s
CHL CUP AV PEAK INDEX: 1.25
CHL CUP MV DEC (S): 285
DOP CAL AO MEAN VELOCITY: 155 cm/s
EERAT: 8.45
EWDT: 285 ms
FS: 45 % — AB (ref 28–44)
Height: 68 in
IV/PV OW: 0.92
LA ID, A-P, ES: 39 mm
LA diam end sys: 39 mm
LA diam index: 2.09 cm/m2
LA vol A4C: 48.9 ml
LA vol: 58.6 mL
LAVOLIN: 31.3 mL/m2
LDCA: 4.52 cm2
LV E/e' medial: 8.45
LV E/e'average: 8.45
LV TDI E'LATERAL: 8.38
LV TDI E'MEDIAL: 6.85
LVELAT: 8.38 cm/s
LVOT SV: 125 mL
LVOT VTI: 27.6 cm
LVOTD: 24 mm
LVOTPV: 121 cm/s
LVOTVTI: 0.56 cm
MV pk E vel: 70.8 m/s
MVPG: 2 mmHg
MVPKAVEL: 86.3 m/s
PW: 12 mm — AB (ref 0.6–1.1)
RV LATERAL S' VELOCITY: 14.6 cm/s
TAPSE: 20.7 mm
Valve area index: 1.35
Valve area: 2.53 cm2
Weight: 2582.4 oz

## 2017-06-05 LAB — CBC WITH DIFFERENTIAL/PLATELET
Basophils Absolute: 0.1 10*3/uL (ref 0.0–0.1)
Basophils Relative: 1 %
EOS ABS: 0.3 10*3/uL (ref 0.0–0.7)
EOS PCT: 4 %
HCT: 28.3 % — ABNORMAL LOW (ref 39.0–52.0)
Hemoglobin: 8.9 g/dL — ABNORMAL LOW (ref 13.0–17.0)
LYMPHS ABS: 1.8 10*3/uL (ref 0.7–4.0)
LYMPHS PCT: 27 %
MCH: 31.2 pg (ref 26.0–34.0)
MCHC: 31.4 g/dL (ref 30.0–36.0)
MCV: 99.3 fL (ref 78.0–100.0)
MONO ABS: 0.4 10*3/uL (ref 0.1–1.0)
Monocytes Relative: 6 %
Neutro Abs: 4.2 10*3/uL (ref 1.7–7.7)
Neutrophils Relative %: 62 %
PLATELETS: 236 10*3/uL (ref 150–400)
RBC: 2.85 MIL/uL — ABNORMAL LOW (ref 4.22–5.81)
RDW: 14.4 % (ref 11.5–15.5)
WBC: 6.7 10*3/uL (ref 4.0–10.5)

## 2017-06-05 LAB — BASIC METABOLIC PANEL
Anion gap: 6 (ref 5–15)
BUN: 40 mg/dL — AB (ref 6–20)
CALCIUM: 8.2 mg/dL — AB (ref 8.9–10.3)
CO2: 22 mmol/L (ref 22–32)
CREATININE: 2.34 mg/dL — AB (ref 0.61–1.24)
Chloride: 110 mmol/L (ref 101–111)
GFR calc Af Amer: 27 mL/min — ABNORMAL LOW (ref >60)
GFR, EST NON AFRICAN AMERICAN: 23 mL/min — AB (ref >60)
GLUCOSE: 105 mg/dL — AB (ref 65–99)
POTASSIUM: 4.7 mmol/L (ref 3.5–5.1)
SODIUM: 138 mmol/L (ref 135–145)

## 2017-06-05 LAB — CREATININE, URINE, RANDOM: Creatinine, Urine: 49.3 mg/dL

## 2017-06-05 LAB — TROPONIN I

## 2017-06-05 LAB — PROTIME-INR
INR: 2.96
PROTHROMBIN TIME: 31.5 s — AB (ref 11.4–15.2)

## 2017-06-05 MED ORDER — SODIUM CHLORIDE 0.9 % IV SOLN
INTRAVENOUS | Status: DC
  Administered 2017-06-05: 16:00:00 via INTRAVENOUS

## 2017-06-05 MED ORDER — TAMSULOSIN HCL 0.4 MG PO CAPS
0.8000 mg | ORAL_CAPSULE | Freq: Every day | ORAL | Status: DC
Start: 2017-06-06 — End: 2017-06-08
  Administered 2017-06-06 – 2017-06-08 (×3): 0.8 mg via ORAL
  Filled 2017-06-05 (×4): qty 2

## 2017-06-05 MED ORDER — WARFARIN SODIUM 1 MG PO TABS
1.0000 mg | ORAL_TABLET | Freq: Once | ORAL | Status: AC
  Administered 2017-06-05: 1 mg via ORAL
  Filled 2017-06-05: qty 1

## 2017-06-05 NOTE — Progress Notes (Signed)
ANTICOAGULATION CONSULT NOTE - Follow Up Consult  Pharmacy Consult for warfarin Indication: atrial fibrillation  Allergies  Allergen Reactions  . Guaifenesin Er Palpitations    Pt can not take mucinex PM  . Prednisone Other (See Comments)    HIGH doses make patient feel "crazy"    Patient Measurements: Height: 5\' 8"  (172.7 cm) Weight: 161 lb 6.4 oz (73.2 kg) IBW/kg (Calculated) : 68.4  Vital Signs: Temp: 97.8 F (36.6 C) (07/06 1331) Temp Source: Oral (07/06 1331) BP: 110/61 (07/06 1331) Pulse Rate: 70 (07/06 1331)  Labs:  Recent Labs  06/04/17 1315 06/04/17 1540 06/04/17 1910 06/04/17 2300 06/05/17 0516  HGB 10.1*  --   --   --  8.9*  HCT 31.5*  --   --   --  28.3*  PLT 257  --   --   --  236  LABPROT 29.0*  --   --   --  31.5*  INR 2.67  --   --   --  2.96  CREATININE 2.64* 2.57*  --   --  2.34*  TROPONINI <0.03 <0.03 <0.03 <0.03  --     Estimated Creatinine Clearance: 20.3 mL/min (A) (by C-G formula based on SCr of 2.34 mg/dL (H)).   Medical History: Past Medical History:  Diagnosis Date  . Ataxia    followed by Dr. Erling Cruz  . Atrial fibrillation (HCC)    coumadin Rx  . BPH (benign prostatic hyperplasia)   . Hyperlipidemia   . Hypertension   . Hypothyroidism   . Peripheral neuropathy   . Sick sinus syndrome (HCC)    s/p PPM  . TIA (transient ischemic attack)    carotid dopplers 5/11: 0-39% bilateral; follow up due 03/2012   Assessment: 83 yo M presented to the ED with AMS. He is on chronic coumadin for history of afib. INR therapeutic on admission.  Now rising.   (PTA dose per [facility] clinic 5mg  MWF, 2.5mg  all other days - pt is confused and unable to answer questions about coumadin dose)  Goal of Therapy:  INR 2-3 Monitor platelets by anticoagulation protocol: Yes   Plan:  Warfarin 1mg  PO x 1 tonight Daily INR  [employer], Pharm.D., BCPS Clinical Pharmacist Pager: [phone] Clinical phone for 06/05/2017 from 8:30-4:00 is [phone]. After  4pm, please call Main [facility] [phone]) for assistance. 06/05/2017 2:28 PM

## 2017-06-05 NOTE — Progress Notes (Signed)
  Echocardiogram 2D Echocardiogram has been performed.  [name] 06/05/2017, 12:55 PM

## 2017-06-05 NOTE — Progress Notes (Signed)
PROGRESS NOTE    [name]  KZS:[id number] DOB: 1924-08-05 DOA: 06/04/2017 PCP: Merrilee Seashore, MD      Brief Narrative: [name] is a 83 year old male with a past medical history of hypertension hyperlipidemia presents to the hospital with brief episodes of confusion was found to have urinary retention of 850cc s/p Foley insertion .   Assessment & Plan:     1-Acute metabolic encephalopathy : 2/2 renal failure 2/2 bladder outlet obstruction( BPH)  s/p foley insertion , creat is improving , IVF GENTLY , avoid nephrotoxic agents , start Flomax. 2-Hx of SSS S/P PM : Stable. 3-Hx of HTN / Hypothyroidisim continue the same management . 4-Hx of Afib .   DVT prophylaxis:  (Lovenox). Code Status: DNR . Family Communication: ( daughter and the wife [name] ) Disposition Plan: (home )    Consultants:   None .  Procedures:   Antimicrobials: (specify start and planned stop date. Auto populated tables are space occupying and do not give end dates)  None .    Subjective:  [name] is more alert and responsive he denies any complaints, no nausea or vomiting .  Objective: Vitals:   06/04/17 2210 06/05/17 0339 06/05/17 0549 06/05/17 1331  BP: (!) 105/44 135/69 (!) 139/58 110/61  Pulse: 62  60 70  Resp: 18  16 18   Temp: (!) 97.5 F (36.4 C)  97.8 F (36.6 C) 97.8 F (36.6 C)  TempSrc: Oral   Oral  SpO2: 98% 100% 92% 96%  Weight:      Height:        Intake/Output Summary (Last 24 hours) at 06/05/17 1537 Last data filed at 06/05/17 0859  Gross per 24 hour  Intake              985 ml  Output             3550 ml  Net            -2565 ml   Filed Weights   06/04/17 1301 06/04/17 1725  Weight: 72.6 kg (160 lb) 73.2 kg (161 lb 6.4 oz)    Examination:  General exam: NAD. Respiratory system: CTAB . Cardiovascular system: S1 & S2 heard, RRR. No JVD, murmurs, rubs, gallops or clicks. No pedal edema. Gastrointestinal system: Abdomen is nondistended, soft and  nontender. No organomegaly or masses felt. Normal bowel sounds heard. Central nervous system: Alert and oriented. No focal neurological deficits. Extremities: Symmetric 5 x 5 power. Skin: No rashes, lesions or ulcers Psychiatry: Judgement and insight appear normal. Mood & affect appropriate.     Data Reviewed: I have personally reviewed following labs and imaging studies  CBC:  Recent Labs Lab 06/04/17 1315 06/05/17 0516  WBC 5.5 6.7  NEUTROABS 3.2 4.2  HGB 10.1* 8.9*  HCT 31.5* 28.3*  MCV 97.2 99.3  PLT 257 732   Basic Metabolic Panel:  Recent Labs Lab 06/04/17 1315 06/04/17 1540 06/05/17 0516  NA 137 135 138  K 4.8 5.5* 4.7  CL 107 105 110  CO2 18* 23 22  GLUCOSE 105* 86 105*  BUN 45* 46* 40*  CREATININE 2.64* 2.57* 2.34*  CALCIUM 8.9 8.6* 8.2*  MG 2.3  --   --   PHOS  --  4.1  --    GFR: Estimated Creatinine Clearance: 20.3 mL/min (A) (by C-G formula based on SCr of 2.34 mg/dL (H)). Liver Function Tests:  Recent Labs Lab 06/04/17 1315 06/04/17 1540  AST 32 36  ALT 21  23  ALKPHOS 74 74  BILITOT 0.5 0.7  PROT 7.0 6.7  ALBUMIN 4.0 3.8   No results for input(s): LIPASE, AMYLASE in the last 168 hours.  Recent Labs Lab 06/04/17 1910  AMMONIA 14   Coagulation Profile:  Recent Labs Lab 06/04/17 1315 06/05/17 0516  INR 2.67 2.96   Cardiac Enzymes:  Recent Labs Lab 06/04/17 1315 06/04/17 1540 06/04/17 1910 06/04/17 2300  TROPONINI <0.03 <0.03 <0.03 <0.03   BNP (last 3 results) No results for input(s): PROBNP in the last 8760 hours. HbA1C: No results for input(s): HGBA1C in the last 72 hours. CBG: No results for input(s): GLUCAP in the last 168 hours. Lipid Profile: No results for input(s): CHOL, HDL, LDLCALC, TRIG, CHOLHDL, LDLDIRECT in the last 72 hours. Thyroid Function Tests: No results for input(s): TSH, T4TOTAL, FREET4, T3FREE, THYROIDAB in the last 72 hours. Anemia Panel: No results for input(s): VITAMINB12, FOLATE, FERRITIN,  TIBC, IRON, RETICCTPCT in the last 72 hours. Urine analysis:    Component Value Date/Time   COLORURINE YELLOW 06/04/2017 1618   APPEARANCEUR CLEAR 06/04/2017 1618   LABSPEC 1.011 06/04/2017 1618   PHURINE 6.0 06/04/2017 1618   GLUCOSEU NEGATIVE 06/04/2017 1618   GLUCOSEU NEGATIVE 09/28/2008 1243   HGBUR NEGATIVE 06/04/2017 1618   BILIRUBINUR NEGATIVE 06/04/2017 1618   KETONESUR NEGATIVE 06/04/2017 1618   PROTEINUR NEGATIVE 06/04/2017 1618   UROBILINOGEN 0.2 mg/dL 09/28/2008 1243   NITRITE NEGATIVE 06/04/2017 1618   LEUKOCYTESUR NEGATIVE 06/04/2017 1618   Sepsis Labs: @LABRCNTIP (procalcitonin:4,lacticidven:4)  )No results found for this or any previous visit (from the past 240 hour(s)).       Radiology Studies: Ct Head Wo Contrast  Result Date: 06/04/2017 CLINICAL DATA:  Confusion.  Possible syncope. EXAM: CT HEAD WITHOUT CONTRAST TECHNIQUE: Contiguous axial images were obtained from the base of the skull through the vertex without intravenous contrast. COMPARISON:  05/22/2016 FINDINGS: Brain: No evidence of acute infarction, hemorrhage, hydrocephalus, extra-axial collection or mass lesion/mass effect. Advanced chronic small vessel ischemia with confluent white matter gliosis and remote lacunar infarcts in the right centrum semiovale and right subcortical frontal parietal white matter. Remote small cortical infarct involving the parasagittal right occipital lobe. Small to moderate left larger than right cerebellar infarcts. Vascular: Atherosclerotic calcification.  No hyperdense vessel. Skull: No acute or aggressive finding. Sinuses/Orbits: Cataract resections and enophthalmos. IMPRESSION: 1. No acute finding or change from 2017. 2. Chronic small vessel ischemia with remote bilateral cerebellar and right occipital infarcts. Electronically Signed   By: Monte Fantasia M.D.   On: 06/04/2017 15:14   Dg Chest Portable 1 View  Result Date: 06/04/2017 CLINICAL DATA:  Unresponsive. EXAM:  PORTABLE CHEST 1 VIEW COMPARISON:  05/23/2016. FINDINGS: Cardiac pacer with lead tips in right atrium right ventricle. Cardiomegaly with normal pulmonary vascularity. No focal infiltrate. Low lung volumes with mild basilar atelectasis. Punctate calcific density noted over the left lung consistent granuloma . No pleural effusion or pneumothorax. Calcified nodular densities in the left mid lung. Degenerative changes thoracic spine . IMPRESSION: 1. Cardiac pacer with lead tips in the right atrium and the right ventricle. 2. Cardiomegaly . Electronically Signed   By: Marcello Moores  Register   On: 06/04/2017 13:15        Scheduled Meds: . amiodarone  100 mg Oral Daily  . levothyroxine  25 mcg Oral QAC breakfast  . sodium chloride flush  3 mL Intravenous Q12H  . [START ON 06/06/2017] tamsulosin  0.8 mg Oral Daily  . warfarin  1 mg Oral  ONCE-1800  . Warfarin - Pharmacist Dosing Inpatient   Does not apply q1800   Continuous Infusions: . sodium chloride       LOS: 1 day    Time spent: 35 minutes     Waldron Session, MD [facility] Pager [phone]  If 7PM-7AM, please contact night-coverage [email or web address] Password [facility] 06/05/2017, 3:37 PM

## 2017-06-05 NOTE — Progress Notes (Signed)
Responded to [place] to support patient who had requested prayer.  Prayed with patient and listened to his concerns and provided guidance and ministry of presence. Chaplain available as needed.  Jaclynn Major, [place], [facility], Pager [phone] [

## 2017-06-06 LAB — COMPREHENSIVE METABOLIC PANEL
ALK PHOS: 68 U/L (ref 38–126)
ALT: 15 U/L — AB (ref 17–63)
AST: 20 U/L (ref 15–41)
Albumin: 3 g/dL — ABNORMAL LOW (ref 3.5–5.0)
Anion gap: 7 (ref 5–15)
BUN: 36 mg/dL — AB (ref 6–20)
CALCIUM: 8.1 mg/dL — AB (ref 8.9–10.3)
CHLORIDE: 110 mmol/L (ref 101–111)
CO2: 22 mmol/L (ref 22–32)
CREATININE: 2.2 mg/dL — AB (ref 0.61–1.24)
GFR, EST AFRICAN AMERICAN: 29 mL/min — AB (ref >60)
GFR, EST NON AFRICAN AMERICAN: 25 mL/min — AB (ref >60)
Glucose, Bld: 107 mg/dL — ABNORMAL HIGH (ref 65–99)
Potassium: 4.6 mmol/L (ref 3.5–5.1)
SODIUM: 139 mmol/L (ref 135–145)
Total Bilirubin: 0.5 mg/dL (ref 0.3–1.2)
Total Protein: 5.7 g/dL — ABNORMAL LOW (ref 6.5–8.1)

## 2017-06-06 LAB — PROTIME-INR
INR: 2.86
Prothrombin Time: 30.6 seconds — ABNORMAL HIGH (ref 11.4–15.2)

## 2017-06-06 LAB — TSH: TSH: 62.892 u[IU]/mL — AB (ref 0.350–4.500)

## 2017-06-06 LAB — CBC
HCT: 27.8 % — ABNORMAL LOW (ref 39.0–52.0)
Hemoglobin: 8.6 g/dL — ABNORMAL LOW (ref 13.0–17.0)
MCH: 30.9 pg (ref 26.0–34.0)
MCHC: 30.9 g/dL (ref 30.0–36.0)
MCV: 100 fL (ref 78.0–100.0)
PLATELETS: 240 10*3/uL (ref 150–400)
RBC: 2.78 MIL/uL — ABNORMAL LOW (ref 4.22–5.81)
RDW: 14.4 % (ref 11.5–15.5)
WBC: 6.9 10*3/uL (ref 4.0–10.5)

## 2017-06-06 LAB — UREA NITROGEN, URINE: Urea Nitrogen, Ur: 433 mg/dL

## 2017-06-06 MED ORDER — WARFARIN SODIUM 2.5 MG PO TABS
2.5000 mg | ORAL_TABLET | Freq: Once | ORAL | Status: AC
Start: 2017-06-06 — End: 2017-06-06
  Administered 2017-06-06: 2.5 mg via ORAL
  Filled 2017-06-06: qty 1

## 2017-06-06 NOTE — Evaluation (Signed)
Physical Therapy Evaluation Patient Details Name: [name] MRN: [id number] DOB: [birth year] Today's Date: 06/06/2017   History of Present Illness  Patient is a 83 yo male admitted 06/04/17 with confusion and urinary retention.  Patient with metabolic encephalopathy, renal failure due to bladder outlet obstruction.    PMH:  SSS s/p PM, HTN, Afib  Clinical Impression  Patient presents with problems listed below.  Will benefit from acute PT to maximize functional mobility prior to discharge.  Patient providing PLOF and home information, most of which is conflicting due to confusion.  Attempted to contact wife/daughter with no answer.  Will need to confirm/correct information below with wife/daughter.  Patient was unable to stand with max assist today.  Would recommend d/c to SNF for continued therapy, with goal to return home safely with wife and caregiver assist.    Follow Up Recommendations SNF;Supervision/Assistance - 24 hour    Equipment Recommendations  None recommended by PT    Recommendations for Other Services       Precautions / Restrictions Precautions Precautions: Fall Restrictions Weight Bearing Restrictions: No      Mobility  Bed Mobility Overal bed mobility: Needs Assistance Bed Mobility: Supine to Sit;Sit to Supine     Supine to sit: Max assist Sit to supine: Max assist   General bed mobility comments: Verbal cues for technique.  Patient able to initiate LE movement toward EOB.  Required assist to scoot hips to EOB using bed pad, bring LE's to floor, and raise trunk to sitting.  Patient sat EOB x 10 minutes with fair sitting balance.  Returned to bed with max assist to bring LE's onto bed and to reposition.  Transfers Overall transfer level: Needs assistance Equipment used: Rolling walker (2 wheeled) Transfers: Sit to/from Stand Sit to Stand: Max assist;From elevated surface         General transfer comment: Attempted x2 to move to standing with assist.   Patient unable to clear hips from bed.  Patient then reports "I have help with this at home.  My caregiver gets me up."  Ambulation/Gait             General Gait Details: Unable  Stairs            Wheelchair Mobility    Modified Rankin (Stroke Patients Only)       Balance Overall balance assessment: Needs assistance Sitting-balance support: Single extremity supported;Feet supported Sitting balance-Leahy Scale: Poor     Standing balance support: Bilateral upper extremity supported Standing balance-Leahy Scale: Zero                               Pertinent Vitals/Pain Pain Assessment: No/denies pain    Home Living Family/patient expects to be discharged to:: Private residence Living Arrangements: Spouse/significant other Available Help at Discharge: Family;Personal care attendant;Available 24 hours/day (Wife 24 hours; caregiver several hours in am and in pm) Type of Home: House Home Access: Stairs to enter   [employer] of Steps: 2 Home Layout: Two level;Bed/bath upstairs Home Equipment: Walker - 2 wheels;Cane - single point;Wheelchair - manual Additional Comments: Unsure of information above - provided by patient.  Unable to reach wife/daughter to confirm.    Prior Function Level of Independence: Independent with assistive device(s);Needs assistance   Gait / Transfers Assistance Needed: Patient initially reports using RW.  Then reports that he is in a w/c during the day. (Caregiver gets patient up in am, dresses, and  transfers patient to w/c for breakfast.  Returns in pm to transfer back to bed and gets patient ready for bed.)  ADL's / Homemaking Assistance Needed: Patient reports initially that wife assists him with shower, and then states that caregiver does this.  Wife prepares meals/housekeeping.  Comments: Unsure of information above - provided by patient.  Unable to reach wife/daughter to confirm.     Hand Dominance         Extremity/Trunk Assessment   Upper Extremity Assessment Upper Extremity Assessment: Generalized weakness    Lower Extremity Assessment Lower Extremity Assessment: Generalized weakness (Strength grossly 3/5 to 3+/5 bilaterally)    Cervical / Trunk Assessment Cervical / Trunk Assessment: Kyphotic  Communication   Communication: No difficulties  Cognition Arousal/Alertness: Awake/alert Behavior During Therapy: WFL for tasks assessed/performed;Flat affect Overall Cognitive Status: No family/caregiver present to determine baseline cognitive functioning                                 General Comments: Patient confused.  Repeating questions.  Reports that "I just got here" but was admitted 2 days ago.      General Comments      Exercises     Assessment/Plan    PT Assessment Patient needs continued PT services  PT Problem List Decreased strength;Decreased activity tolerance;Decreased balance;Decreased mobility;Decreased coordination;Decreased cognition;Decreased knowledge of use of DME;Cardiopulmonary status limiting activity       PT Treatment Interventions DME instruction;Gait training;Stair training;Functional mobility training;Therapeutic activities;Therapeutic exercise;Balance training;Cognitive remediation;Patient/family education    PT Goals (Current goals can be found in the Care Plan section)  Acute Rehab PT Goals Patient Stated Goal: None stated PT Goal Formulation: With patient Time For Goal Achievement: 06/20/17 Potential to Achieve Goals: Fair    Frequency Min 3X/week   Barriers to discharge Inaccessible home environment;Decreased caregiver support Unsure of assist at home and of home situation (2 stories?, bedroom on 2nd floor?)    Co-evaluation               AM-PAC PT "6 Clicks" Daily Activity  Outcome Measure Difficulty turning over in bed (including adjusting bedclothes, sheets and blankets)?: Total Difficulty moving from lying on  back to sitting on the side of the bed? : Total Difficulty sitting down on and standing up from a chair with arms (e.g., wheelchair, bedside commode, etc,.)?: Total Help needed moving to and from a bed to chair (including a wheelchair)?: Total Help needed walking in hospital room?: Total Help needed climbing 3-5 steps with a railing? : Total 6 Click Score: 6    End of Session Equipment Utilized During Treatment: Gait belt;Oxygen Activity Tolerance: Patient limited by fatigue Patient left: in bed;with call bell/phone within reach;with bed alarm set Nurse Communication: Mobility status PT Visit Diagnosis: Unsteadiness on feet (R26.81);Difficulty in walking, not elsewhere classified (R26.2);Muscle weakness (generalized) (M62.81)    Time: 9629-5284 PT Time Calculation (min) (ACUTE ONLY): 24 min   Charges:   PT Evaluation $PT Eval Moderate Complexity: 1 Procedure PT Treatments $Therapeutic Activity: 8-22 mins   PT G Codes:        Carita Pian. Sanjuana Kava, [facility] Acute Rehab Services Pager [place] 06/06/2017, 7:42 PM

## 2017-06-06 NOTE — Progress Notes (Signed)
ANTICOAGULATION CONSULT NOTE - Follow Up Consult  Pharmacy Consult for warfarin Indication: atrial fibrillation  Allergies  Allergen Reactions  . Guaifenesin Er Palpitations    Pt can not take mucinex PM  . Prednisone Other (See Comments)    HIGH doses make patient feel "crazy"    Patient Measurements: Height: 5\' 8"  (172.7 cm) Weight: 161 lb 6.4 oz (73.2 kg) IBW/kg (Calculated) : 68.4  Vital Signs: Temp: 98.6 F (37 C) (07/07 0558) BP: 145/65 (07/07 0558) Pulse Rate: 64 (07/07 0558)  Labs:  Recent Labs  06/04/17 1315 06/04/17 1540 06/04/17 1910 06/04/17 2300 06/05/17 0516 06/06/17 0307  HGB 10.1*  --   --   --  8.9* 8.6*  HCT 31.5*  --   --   --  28.3* 27.8*  PLT 257  --   --   --  236 240  LABPROT 29.0*  --   --   --  31.5* 30.6*  INR 2.67  --   --   --  2.96 2.86  CREATININE 2.64* 2.57*  --   --  2.34* 2.20*  TROPONINI <0.03 <0.03 <0.03 <0.03  --   --     Estimated Creatinine Clearance: 21.6 mL/min (A) (by C-G formula based on SCr of 2.2 mg/dL (H)).   Medical History: Past Medical History:  Diagnosis Date  . Ataxia    followed by Dr. Erling Cruz  . Atrial fibrillation (HCC)    coumadin Rx  . BPH (benign prostatic hyperplasia)   . Hyperlipidemia   . Hypertension   . Hypothyroidism   . Peripheral neuropathy   . Sick sinus syndrome (HCC)    s/p PPM  . TIA (transient ischemic attack)    carotid dopplers 5/11: 0-39% bilateral; follow up due 03/2012   Assessment: 83 yo M presented to the ED with AMS. He is on chronic coumadin for history of afib. INR therapeutic on admission.    (PTA dose per [facility] clinic 5mg  MWF, 2.5mg  all other days - pt is confused and unable to answer questions about coumadin dose)  Goal of Therapy:  INR 2-3 Monitor platelets by anticoagulation protocol: Yes   Plan:  Warfarin 2.5mg  PO x 1 tonight Daily INR  [employer], Pharm.D., BCPS Clinical Pharmacist Pager: [phone] Clinical phone for 06/06/2017 from 8:30-4:00 is  [phone]. After 4pm, please call Main Rx [phone]) for assistance. 06/06/2017 11:58 AM

## 2017-06-06 NOTE — Progress Notes (Signed)
PROGRESS NOTE    [name]  QPY:[id number] DOB: 03-Jul-1924 DOA: 06/04/2017 PCP: Merrilee Seashore, MD      Brief Narrative: [name] is a 83 year old male with a past medical history of hypertension hyperlipidemia presents to the hospital with brief episodes of confusion was found to have urinary retention of 850cc s/p Foley insertion .   Assessment & Plan:     1-Acute metabolic encephalopathy : 2/2 renal failure 2/2 bladder outlet obstruction( BPH)  s/p foley insertion , creat is improving , IVF GENTLY , avoid nephrotoxic agents , start Flomax. 2-Hx of SSS S/P PM : Stable. 3-Hx of HTN / Hypothyroidisim continue the same management . 4-Hx of Afib .   DVT prophylaxis:  (Lovenox). Code Status: DNR . Family Communication: ( daughter and the wife Hassan Rowan ) Disposition Plan: (home )    Consultants:   None .  Procedures:   Antimicrobials: (specify start and planned stop date. Auto populated tables are space occupying and do not give end dates)  None .    Subjective:  [name] is doing much better today is alert awake and oriented, his main complaint is being weak and out of energy is severely deconditioned ,denies any fevers or chills or nausea or vomiting .  Objective: Vitals:   06/05/17 1331 06/05/17 2238 06/06/17 0558 06/06/17 1344  BP: 110/61 137/68 (!) 145/65 (!) 124/51  Pulse: 70 61 64 61  Resp: 18 16 20 18   Temp: 97.8 F (36.6 C) 99 F (37.2 C) 98.6 F (37 C) 98.4 F (36.9 C)  TempSrc: Oral Oral  Oral  SpO2: 96% 100% 100% 100%  Weight:      Height:        Intake/Output Summary (Last 24 hours) at 06/06/17 1349 Last data filed at 06/06/17 1327  Gross per 24 hour  Intake          1339.17 ml  Output             1350 ml  Net           -10.83 ml   Filed Weights   06/04/17 1301 06/04/17 1725  Weight: 72.6 kg (160 lb) 73.2 kg (161 lb 6.4 oz)    Examination:  General exam: NAD. Respiratory system: Clear to auscultation  bilaterally.. Cardiovascular system: S1 & S2 heard, RRR. No JVD, murmurs, rubs, gallops or clicks. No pedal edema. Gastrointestinal system: Abdomen is nondistended, soft and nontender. No organomegaly or masses felt. Normal bowel sounds heard. Central nervous system: Alert and oriented. No focal neurological deficits. Extremities: Symmetric 5 x 5 power. Skin: No rashes, lesions or ulcers Psychiatry: Alert awake oriented.  Data Reviewed: I have personally reviewed following labs and imaging studies  CBC:  Recent Labs Lab 06/04/17 1315 06/05/17 0516 06/06/17 0307  WBC 5.5 6.7 6.9  NEUTROABS 3.2 4.2  --   HGB 10.1* 8.9* 8.6*  HCT 31.5* 28.3* 27.8*  MCV 97.2 99.3 100.0  PLT 257 236 124   Basic Metabolic Panel:  Recent Labs Lab 06/04/17 1315 06/04/17 1540 06/05/17 0516 06/06/17 0307  NA 137 135 138 139  K 4.8 5.5* 4.7 4.6  CL 107 105 110 110  CO2 18* 23 22 22   GLUCOSE 105* 86 105* 107*  BUN 45* 46* 40* 36*  CREATININE 2.64* 2.57* 2.34* 2.20*  CALCIUM 8.9 8.6* 8.2* 8.1*  MG 2.3  --   --   --   PHOS  --  4.1  --   --  GFR: Estimated Creatinine Clearance: 21.6 mL/min (A) (by C-G formula based on SCr of 2.2 mg/dL (H)). Liver Function Tests:  Recent Labs Lab 06/04/17 1315 06/04/17 1540 06/06/17 0307  AST 32 36 20  ALT 21 23 15*  ALKPHOS 74 74 68  BILITOT 0.5 0.7 0.5  PROT 7.0 6.7 5.7*  ALBUMIN 4.0 3.8 3.0*   No results for input(s): LIPASE, AMYLASE in the last 168 hours.  Recent Labs Lab 06/04/17 1910  AMMONIA 14   Coagulation Profile:  Recent Labs Lab 06/04/17 1315 06/05/17 0516 06/06/17 0307  INR 2.67 2.96 2.86   Cardiac Enzymes:  Recent Labs Lab 06/04/17 1315 06/04/17 1540 06/04/17 1910 06/04/17 2300  TROPONINI <0.03 <0.03 <0.03 <0.03   BNP (last 3 results) No results for input(s): PROBNP in the last 8760 hours. HbA1C: No results for input(s): HGBA1C in the last 72 hours. CBG: No results for input(s): GLUCAP in the last 168  hours. Lipid Profile: No results for input(s): CHOL, HDL, LDLCALC, TRIG, CHOLHDL, LDLDIRECT in the last 72 hours. Thyroid Function Tests: No results for input(s): TSH, T4TOTAL, FREET4, T3FREE, THYROIDAB in the last 72 hours. Anemia Panel: No results for input(s): VITAMINB12, FOLATE, FERRITIN, TIBC, IRON, RETICCTPCT in the last 72 hours. Urine analysis:    Component Value Date/Time   COLORURINE YELLOW 06/04/2017 1618   APPEARANCEUR CLEAR 06/04/2017 1618   LABSPEC 1.011 06/04/2017 1618   PHURINE 6.0 06/04/2017 1618   GLUCOSEU NEGATIVE 06/04/2017 1618   GLUCOSEU NEGATIVE 09/28/2008 1243   HGBUR NEGATIVE 06/04/2017 1618   BILIRUBINUR NEGATIVE 06/04/2017 1618   KETONESUR NEGATIVE 06/04/2017 1618   PROTEINUR NEGATIVE 06/04/2017 1618   UROBILINOGEN 0.2 mg/dL 09/28/2008 1243   NITRITE NEGATIVE 06/04/2017 1618   LEUKOCYTESUR NEGATIVE 06/04/2017 1618   Sepsis Labs: @LABRCNTIP (procalcitonin:4,lacticidven:4)  )No results found for this or any previous visit (from the past 240 hour(s)).       Radiology Studies: Ct Head Wo Contrast  Result Date: 06/04/2017 CLINICAL DATA:  Confusion.  Possible syncope. EXAM: CT HEAD WITHOUT CONTRAST TECHNIQUE: Contiguous axial images were obtained from the base of the skull through the vertex without intravenous contrast. COMPARISON:  05/22/2016 FINDINGS: Brain: No evidence of acute infarction, hemorrhage, hydrocephalus, extra-axial collection or mass lesion/mass effect. Advanced chronic small vessel ischemia with confluent white matter gliosis and remote lacunar infarcts in the right centrum semiovale and right subcortical frontal parietal white matter. Remote small cortical infarct involving the parasagittal right occipital lobe. Small to moderate left larger than right cerebellar infarcts. Vascular: Atherosclerotic calcification.  No hyperdense vessel. Skull: No acute or aggressive finding. Sinuses/Orbits: Cataract resections and enophthalmos. IMPRESSION: 1.  No acute finding or change from 2017. 2. Chronic small vessel ischemia with remote bilateral cerebellar and right occipital infarcts. Electronically Signed   By: Monte Fantasia M.D.   On: 06/04/2017 15:14        Scheduled Meds: . amiodarone  100 mg Oral Daily  . levothyroxine  25 mcg Oral QAC breakfast  . sodium chloride flush  3 mL Intravenous Q12H  . tamsulosin  0.8 mg Oral Daily  . warfarin  2.5 mg Oral ONCE-1800  . Warfarin - Pharmacist Dosing Inpatient   Does not apply q1800   Continuous Infusions: . sodium chloride 50 mL/hr at 06/05/17 1613     LOS: 2 days    Time spent: 35 minutes     Waldron Session, MD [facility] Pager [phone]  If 7PM-7AM, please contact night-coverage [email or web address] Password [email or web address] 06/06/2017, 1:49 PM

## 2017-06-06 NOTE — Progress Notes (Signed)
Foley was d/c'd at 14:28,, pt tolerated well. Pt voided within the 4 hour window and post void residual was 57. Pt currently wearing a condom cath, as he is confused and incontinent. Will ctm

## 2017-06-07 DIAGNOSIS — E039 Hypothyroidism, unspecified: Secondary | ICD-10-CM

## 2017-06-07 LAB — COMPREHENSIVE METABOLIC PANEL
ALBUMIN: 2.7 g/dL — AB (ref 3.5–5.0)
ALT: 14 U/L — ABNORMAL LOW (ref 17–63)
ANION GAP: 4 — AB (ref 5–15)
AST: 19 U/L (ref 15–41)
Alkaline Phosphatase: 72 U/L (ref 38–126)
BUN: 33 mg/dL — ABNORMAL HIGH (ref 6–20)
CO2: 23 mmol/L (ref 22–32)
Calcium: 8.2 mg/dL — ABNORMAL LOW (ref 8.9–10.3)
Chloride: 109 mmol/L (ref 101–111)
Creatinine, Ser: 1.99 mg/dL — ABNORMAL HIGH (ref 0.61–1.24)
GFR calc Af Amer: 32 mL/min — ABNORMAL LOW (ref >60)
GFR calc non Af Amer: 28 mL/min — ABNORMAL LOW (ref >60)
GLUCOSE: 112 mg/dL — AB (ref 65–99)
POTASSIUM: 4.6 mmol/L (ref 3.5–5.1)
SODIUM: 136 mmol/L (ref 135–145)
TOTAL PROTEIN: 5.4 g/dL — AB (ref 6.5–8.1)
Total Bilirubin: 0.4 mg/dL (ref 0.3–1.2)

## 2017-06-07 LAB — CBC
HEMATOCRIT: 26.9 % — AB (ref 39.0–52.0)
HEMOGLOBIN: 8.4 g/dL — AB (ref 13.0–17.0)
MCH: 31 pg (ref 26.0–34.0)
MCHC: 31.2 g/dL (ref 30.0–36.0)
MCV: 99.3 fL (ref 78.0–100.0)
Platelets: 214 10*3/uL (ref 150–400)
RBC: 2.71 MIL/uL — ABNORMAL LOW (ref 4.22–5.81)
RDW: 14.2 % (ref 11.5–15.5)
WBC: 6.2 10*3/uL (ref 4.0–10.5)

## 2017-06-07 LAB — PROTIME-INR
INR: 2.54
PROTHROMBIN TIME: 27.8 s — AB (ref 11.4–15.2)

## 2017-06-07 LAB — T4, FREE: Free T4: 0.5 ng/dL — ABNORMAL LOW (ref 0.61–1.12)

## 2017-06-07 MED ORDER — LEVOTHYROXINE SODIUM 100 MCG PO TABS
100.0000 ug | ORAL_TABLET | Freq: Every day | ORAL | Status: DC
  Administered 2017-06-07: 100 ug via ORAL
  Filled 2017-06-07: qty 1

## 2017-06-07 MED ORDER — LEVOTHYROXINE SODIUM 100 MCG PO TABS: 100.0000 ug | ORAL_TABLET | Freq: Every day | ORAL | Status: DC

## 2017-06-07 MED ORDER — WARFARIN SODIUM 2.5 MG PO TABS
2.5000 mg | ORAL_TABLET | Freq: Once | ORAL | Status: AC
  Administered 2017-06-07: 2.5 mg via ORAL
  Filled 2017-06-07: qty 1

## 2017-06-07 NOTE — Progress Notes (Signed)
PROGRESS NOTE    Martyn Timme  PJA:[id number] DOB: 04-16-1924 DOA: 06/04/2017 PCP: Merrilee Seashore, MD      Brief Narrative: Mr. Hattabaugh is a 83 year old male with a past medical history of hypertension hyperlipidemia , STROKE , BPH ,Afib presents to the hospital with brief episodes of confusion was found to have urinary retention of 850cc s/p Foley insertion .   Assessment & Plan:     1-Acute metabolic encephalopathy : 2/2 renal failure 2/2 bladder outlet obstruction( BPH)  s/p foley insertion , creat is improving , avoid nephrotoxic agents , trial void has failed , Foley reinserted, continue Flomax, stop IV fluids not to provoke any CHF, conservative approach to his BPH with his age and comorbidities. 2-Hx of SSS S/P PM : Stable. 3-Hx of Hypothyroidisim : Severe elevated TSH reflective of subtherapeutic levothyroxine and profound hypothyroidisim we are going to increase the dose to 100 g every morning , need to follow up with TSH as an outpt . 4-Hx of Afib : Rate is controlled continue amiodarone and warfarin INR is therapeutic. 5-history of stroke with residual dysphagia : puree Diet has been recommended , family wants to feed him everything , we explained the risk of aspiration and asked for SLP eval .    Plan for [facility] tomorrow. DVT prophylaxis:  (Lovenox). Code Status: DNR . Family Communication: ( daughter and the wife Hassan Rowan ) Disposition Plan: (home )    Consultants:   None .  Procedures:   Antimicrobials: (specify start and planned stop date. Auto populated tables are space occupying and do not give end dates)  None .    Subjective:  Mr. Gahan had a rough morning today when he choked on his food, started to cough aggressively, we stopped his throat and his cough resolved, patient has a long history of dysphagia after stroke and he stated that he was feeding to fast, still weak and tired.  Objective: Vitals:   06/06/17 1344 06/06/17 2118 06/07/17 0519  06/07/17 0944  BP: (!) 124/51 (!) 140/57 (!) 129/52 (!) 116/52  Pulse: 61 61 62 67  Resp: 18 16 20 20   Temp: 98.4 F (36.9 C) 99.3 F (37.4 C) 98.2 F (36.8 C) 98.7 F (37.1 C)  TempSrc: Oral Oral Oral Oral  SpO2: 100% 100% 99% 97%  Weight:      Height:        Intake/Output Summary (Last 24 hours) at 06/07/17 1348 Last data filed at 06/07/17 0551  Gross per 24 hour  Intake             1460 ml  Output             1200 ml  Net              260 ml   Filed Weights   06/04/17 1301 06/04/17 1725  Weight: 72.6 kg (160 lb) 73.2 kg (161 lb 6.4 oz)    Examination:  General exam: NAD. Respiratory system: Clear to auscultation bilaterally.. Cardiovascular system: S1 & S2 heard, RRR. No JVD, murmurs, rubs, gallops or clicks. No pedal edema. Gastrointestinal system: Abdomen is nondistended, soft and nontender. No organomegaly or masses felt. Normal bowel sounds heard. Central nervous system: Alert and oriented. No focal neurological deficits. Extremities: Symmetric 5 x 5 power. Skin: No rashes, lesions or ulcers Psychiatry: appropriate mood , oriented .  Data Reviewed: I have personally reviewed following labs and imaging studies  CBC:  Recent Labs Lab 06/04/17 1315 06/05/17 0516 06/06/17 3419  06/07/17 0607  WBC 5.5 6.7 6.9 6.2  NEUTROABS 3.2 4.2  --   --   HGB 10.1* 8.9* 8.6* 8.4*  HCT 31.5* 28.3* 27.8* 26.9*  MCV 97.2 99.3 100.0 99.3  PLT 257 236 240 638   Basic Metabolic Panel:  Recent Labs Lab 06/04/17 1315 06/04/17 1540 06/05/17 0516 06/06/17 0307 06/07/17 0607  NA 137 135 138 139 136  K 4.8 5.5* 4.7 4.6 4.6  CL 107 105 110 110 109  CO2 18* 23 22 22 23   GLUCOSE 105* 86 105* 107* 112*  BUN 45* 46* 40* 36* 33*  CREATININE 2.64* 2.57* 2.34* 2.20* 1.99*  CALCIUM 8.9 8.6* 8.2* 8.1* 8.2*  MG 2.3  --   --   --   --   PHOS  --  4.1  --   --   --    GFR: Estimated Creatinine Clearance: 23.9 mL/min (A) (by C-G formula based on SCr of 1.99 mg/dL (H)). Liver  Function Tests:  Recent Labs Lab 06/04/17 1315 06/04/17 1540 06/06/17 0307 06/07/17 0607  AST 32 36 20 19  ALT 21 23 15* 14*  ALKPHOS 74 74 68 72  BILITOT 0.5 0.7 0.5 0.4  PROT 7.0 6.7 5.7* 5.4*  ALBUMIN 4.0 3.8 3.0* 2.7*   No results for input(s): LIPASE, AMYLASE in the last 168 hours.  Recent Labs Lab 06/04/17 1910  AMMONIA 14   Coagulation Profile:  Recent Labs Lab 06/04/17 1315 06/05/17 0516 06/06/17 0307 06/07/17 0607  INR 2.67 2.96 2.86 2.54   Cardiac Enzymes:  Recent Labs Lab 06/04/17 1315 06/04/17 1540 06/04/17 1910 06/04/17 2300  TROPONINI <0.03 <0.03 <0.03 <0.03   BNP (last 3 results) No results for input(s): PROBNP in the last 8760 hours. HbA1C: No results for input(s): HGBA1C in the last 72 hours. CBG: No results for input(s): GLUCAP in the last 168 hours. Lipid Profile: No results for input(s): CHOL, HDL, LDLCALC, TRIG, CHOLHDL, LDLDIRECT in the last 72 hours. Thyroid Function Tests:  Recent Labs  06/06/17 1425  TSH 62.892*   Anemia Panel: No results for input(s): VITAMINB12, FOLATE, FERRITIN, TIBC, IRON, RETICCTPCT in the last 72 hours. Urine analysis:    Component Value Date/Time   COLORURINE YELLOW 06/04/2017 1618   APPEARANCEUR CLEAR 06/04/2017 1618   LABSPEC 1.011 06/04/2017 1618   PHURINE 6.0 06/04/2017 1618   GLUCOSEU NEGATIVE 06/04/2017 1618   GLUCOSEU NEGATIVE 09/28/2008 1243   HGBUR NEGATIVE 06/04/2017 1618   BILIRUBINUR NEGATIVE 06/04/2017 1618   KETONESUR NEGATIVE 06/04/2017 1618   PROTEINUR NEGATIVE 06/04/2017 1618   UROBILINOGEN 0.2 mg/dL 09/28/2008 1243   NITRITE NEGATIVE 06/04/2017 1618   LEUKOCYTESUR NEGATIVE 06/04/2017 1618   Sepsis Labs: @LABRCNTIP (procalcitonin:4,lacticidven:4)  )No results found for this or any previous visit (from the past 240 hour(s)).       Radiology Studies: No results found.      Scheduled Meds: . amiodarone  100 mg Oral Daily  . levothyroxine  100 mcg Oral QAC  breakfast  . sodium chloride flush  3 mL Intravenous Q12H  . tamsulosin  0.8 mg Oral Daily  . warfarin  2.5 mg Oral ONCE-1800  . Warfarin - Pharmacist Dosing Inpatient   Does not apply q1800   Continuous Infusions:    LOS: 3 days    Time spent: 35 minutes     Waldron Session, MD [facility] Pager [phone]  If 7PM-7AM, please contact night-coverage [email or web address] Password [email or web address] 06/07/2017, 1:48 PM

## 2017-06-07 NOTE — Progress Notes (Signed)
Patient didn't void urine then did bladder scan it was 501 ml. Notified the MD on call  And insert  foley catheter no 16 as an order. Urine output was 600 ml . Patient felt comfort.

## 2017-06-07 NOTE — NC FL2 (Signed)
[place] LEVEL OF CARE SCREENING TOOL     IDENTIFICATION  Patient Name: [name] Birthdate: [birth year] Sex: male Admission Date (Current Location): 06/04/2017  [facility] and [place] Number:  Herbalist and Address:  [facility], [place] [address] [place], [place] [address]      Provider Number: [id number]  Attending Physician Name and Address:  Waldron Session, MD  Relative Name and Phone Number:       Current Level of Care: Hospital Recommended Level of Care: [place] Prior Approval Number:    Date Approved/Denied:   PASRR Number:   [id number]  Discharge Plan: SNF    Current Diagnoses: Patient Active Problem List   Diagnosis Date Noted  . Moderate dementia without behavioral disturbance 04/20/2017  . Fever   . Discitis of lumbar region   . Enterococcal bacteremia 05/24/2016  . Acute encephalopathy 05/24/2016  . Compression fracture of L2 lumbar vertebra with delayed healing 05/23/2016  . Elevated C-reactive protein (CRP) 05/22/2016  . Altered mental status 05/22/2016  . Right hip pain 03/17/2016  . Encounter for therapeutic drug monitoring 01/05/2014  . Gait difficulty 11/22/2013  . Chronic ischemic right PCA stroke 11/22/2013  . 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 PERIPHERAL NEUROPATHY 07/18/2010  . ABRASION/FRICION BURN OTH MX&UNS SITE W/O INF 09/25/2009  . PACEMAKER, PERMANENT 09/25/2009  . PREMATURE VENTRICULAR CONTRACTIONS 02/23/2009  . Bradycardia 02/23/2009  . ATAXIA 02/23/2009  . Anxiety state 08/03/2008  . BPH (benign prostatic hyperplasia) 08/03/2008  . INSOMNIA-SLEEP DISORDER-UNSPEC 08/03/2008  . Hypothyroidism 10/26/2007  . PERIPHERAL VASCULAR DISEASE 10/26/2007  . ALLERGIC RHINITIS 10/26/2007  . HYPERTENSION, BENIGN ESSENTIAL 10/25/2007  . ATRIAL FIBRILLATION 08/26/2007  . Hyperlipidemia 08/23/2007  . SICK SINUS SYNDROME 08/23/2007  . CAROTID ARTERY STENOSIS  08/23/2007    Orientation RESPIRATION BLADDER Height & Weight     Self, Place  O2 External catheter Weight: 161 lb 6.4 oz (73.2 kg) Height:  5\' 8"  (172.7 cm)  BEHAVIORAL SYMPTOMS/MOOD NEUROLOGICAL BOWEL NUTRITION STATUS      Continent Diet (Fluid Consistency; Thin)  AMBULATORY STATUS COMMUNICATION OF NEEDS Skin   Extensive Assist Verbally Normal                       Personal Care Assistance Level of Assistance  Bathing, Feeding, Dressing Bathing Assistance: Maximum assistance Feeding assistance: Limited assistance Dressing Assistance: Maximum assistance     Functional Limitations Info  Sight, Hearing, Speech Sight Info: Adequate Hearing Info: Impaired Speech Info: Adequate    SPECIAL CARE FACTORS FREQUENCY  PT (By licensed PT), OT (By licensed OT)                    Contractures Contractures Info: Not present    Additional Factors Info  Code Status, Allergies Code Status Info: DNR Allergies Info: Guaifenesin Er, Prednisone           Current Medications (06/07/2017):  This is the current hospital active medication list Current Facility-Administered Medications  Medication Dose Route Frequency Provider Last Rate Last Dose  . acetaminophen (TYLENOL) tablet 650 mg  650 mg Oral Q6H PRN Elwin Mocha, MD   650 mg at 06/07/17 6389   Or  . acetaminophen (TYLENOL) suppository 650 mg  650 mg Rectal Q6H PRN Elwin Mocha, MD      . amiodarone (PACERONE) tablet 100 mg  100 mg Oral Daily Elwin Mocha, MD  100 mg at 06/07/17 0907  . levothyroxine (SYNTHROID, LEVOTHROID) tablet 100 mcg  100 mcg Oral QAC breakfast Waldron Session, MD   100 mcg at 06/07/17 1352  . ondansetron (ZOFRAN) tablet 4 mg  4 mg Oral Q6H PRN Elwin Mocha, MD       Or  . ondansetron [facility]) injection 4 mg  4 mg Intravenous Q6H PRN Elwin Mocha, MD      . senna-docusate (Senokot-S) tablet 1 tablet  1 tablet Oral QHS PRN Elwin Mocha, MD   1 tablet at 06/05/17 2138  . sodium  chloride flush (NS) 0.9 % injection 3 mL  3 mL Intravenous Q12H Elwin Mocha, MD   3 mL at 06/07/17 0907  . tamsulosin (FLOMAX) capsule 0.8 mg  0.8 mg Oral Daily Waldron Session, MD   0.8 mg at 06/07/17 0907  . warfarin (COUMADIN) tablet 2.5 mg  2.5 mg Oral ONCE-1800 Hammons, Theone Murdoch, RPH      . Warfarin - Pharmacist Dosing Inpatient   Does not apply q1800 Rumbarger, Valeda Malm [facility]         Discharge Medications: Please see discharge summary for a list of discharge medications.  Relevant Imaging Results:  Relevant Lab Results:   Additional Information SSN: [place] [address] [place], [place]

## 2017-06-07 NOTE — Progress Notes (Addendum)
Physical Therapy Progress Note 06/07/17 Spoke with wife and daughter to clarify home living and PLOF information.  Please see updated information below.  Per family, patient's functional status has continued to decline. He was unable to stand with max assist during evaluation 06/06/17.  Continue to recommend SNF at d/c for continued therapy with goal to return home safely.    06/07/17 Emery expects to be discharged to: Private residence  Living Arrangements Spouse/significant other  Available Help at Discharge Family;Personal care attendant;Available 24 hours/day (Wife 24 hours; caregiver several hours in am and in pm)  Type of [place] to enter  Entrance Stairs-Number of Steps 2  [place] Two level;Able to live on main level with bedroom/bathroom  [place] - 2 wheels;Cane - single point;Wheelchair - manual (Lift chair)  Additional Comments Information verified with wife/daughter  Prior Function  Level of Independence Needs assistance  Gait / Transfers Assistance Needed Patient is unable to ambulate. Caregivers transfer patient bed <> w/c.  During day, wife moves patient to sink and helps him pull up to stand, and places [facility] behind him.  Becoming very difficult per wife.  ADL's / Homemaking Assistance Needed Caregivers assist with bathing, dressing, grooming.  Comments Information confirmed with wife and daughter.   Carita Pian Sanjuana Kava, [place] Pager [phone]

## 2017-06-07 NOTE — Evaluation (Signed)
Occupational Therapy Evaluation Patient Details Name: [name] MRN: [id number] DOB: 06/13/1924 Today's Date: 06/07/2017    History of Present Illness Patient is a 83 yo male admitted 06/04/17 with confusion and urinary retention.  Patient with metabolic encephalopathy, renal failure due to bladder outlet obstruction.    PMH:  SSS s/p PM, HTN, Afib   Clinical Impression   PTA, pt was not ambulatory but was able to complete sit<>stand transfers with his wife assisting so that she could place [facility] behind pt. Pt presents with confusion, decreased activity tolerance for ADL, and generalized weakness impacting his ability to participate in ADL tasks. He currently requires max assist for the majority of ADL tasks and would benefit from continued OT services to improve independence with ADL and functional mobility. Feel he would best benefit from SNF placement for continued OT services in order to maximize safety and ability to participate in ADL.    Follow Up Recommendations  SNF    Equipment Recommendations  Other (comment) (TBD at next venue of care)    Recommendations for Other Services       Precautions / Restrictions Precautions Precautions: Fall Restrictions Weight Bearing Restrictions: No      Mobility Bed Mobility                  Transfers                 General transfer comment: Deferred OOB this session due to pt with significant fatigue following AROM exercises and falling asleep.     Balance                                           ADL either performed or assessed with clinical judgement   ADL Overall ADL's : Needs assistance/impaired     Grooming: Minimal assistance;Bed level   Upper Body Bathing: Maximal assistance;Bed level   Lower Body Bathing: Maximal assistance;Bed level   Upper Body Dressing : Maximal assistance;Bed level   Lower Body Dressing: Maximal assistance;Bed level       Toileting- Clothing Manipulation  and Hygiene: Maximal assistance;Bed level         General ADL Comments: Pt with significant fatigue due to family reported "rough morning" with choking on food and decreased responsiveness.      Vision   Additional Comments: No visual deficitis initially noted. Will continue to functionally assess.      Perception     Praxis      Pertinent Vitals/Pain Pain Assessment: Faces Faces Pain Scale: Hurts a little bit Pain Location: sore all over Pain Descriptors / Indicators: Sore Pain Intervention(s): Monitored during session     Hand Dominance     Extremity/Trunk Assessment Upper Extremity Assessment Upper Extremity Assessment: Generalized weakness   Lower Extremity Assessment Lower Extremity Assessment: Generalized weakness       Communication Communication Communication: No difficulties   Cognition Arousal/Alertness: Awake/alert;Lethargic (Lethargic at end of session following exercise. ) Behavior During Therapy: WFL for tasks assessed/performed;Flat affect Overall Cognitive Status: History of cognitive impairments - at baseline                                 General Comments: Pt pleasantly confused. Frequently repeating questions.    General Comments       Exercises Exercises: General Upper  Extremity;Other exercises General Exercises - Upper Extremity Shoulder Flexion: AROM;20 reps;Both;Supine (2x10) Elbow Flexion: AROM;Both;20 reps;Supine (2x10) Wrist Flexion: AROM;Both;20 reps;Supine (2x10) Wrist Extension: AROM;Both;20 reps;Supine (2x10) Other Exercises Other Exercises: Tricep presses into bed x10 repetitions bilaterally.    Shoulder Instructions      Home Living Family/patient expects to be discharged to:: Skilled nursing facility Living Arrangements: Spouse/significant other Available Help at Discharge: Family;Personal care attendant;Available 24 hours/day (Wife 24 hours; caregiver several hours in am and pm) Type of Home: House Home  Access: Stairs to enter [employer] of Steps: 2   Home Layout: Two level;Able to live on main level with bedroom/bathroom               Home Equipment: [place] R[place] - 2 wheels;Cane - single point;Wheelchair - manual (lift chair)          Prior Functioning/Environment Level of Independence: Needs assistance  Gait / Transfers Assistance Needed: Patient is unable to ambulate. Caregivers transfer patient bed <> w/c.  During day, wife moves patient to sink and helps him pull up to stand, and places [facility] behind him.  Becoming very difficult per wife. ADL's / Homemaking Assistance Needed: Caregivers assist with bathing, dressing, grooming.            OT Problem List: Decreased activity tolerance;Impaired balance (sitting and/or standing);Decreased safety awareness;Decreased knowledge of use of DME or AE;Decreased strength;Decreased range of motion;Decreased knowledge of precautions;Decreased cognition;Impaired UE functional use;Pain      OT Treatment/Interventions: Self-care/ADL training;Therapeutic exercise;Energy conservation;DME and/or AE instruction;Therapeutic activities;Cognitive remediation/compensation;Patient/family education;Balance training    OT Goals(Current goals can be found in the care plan section) Acute Rehab OT Goals Patient Stated Goal: None stated OT Goal Formulation: With patient/family Time For Goal Achievement: 06/21/17 Potential to Achieve Goals: Good ADL Goals Pt Will Perform Grooming: with min guard assist;sitting Pt Will Perform Upper Body Dressing: with min guard assist;sitting Pt Will Transfer to Toilet: with mod assist;stand pivot transfer;bedside commode Pt Will Perform Toileting - Clothing Manipulation and hygiene: with mod assist;sit to/from stand Pt/caregiver will Perform Home Exercise Program: Increased strength;Both right and left upper extremity;With written HEP provided;With Supervision  OT Frequency: Min 2X/week   Barriers to D/C:             Co-evaluation              AM-PAC PT "6 Clicks" Daily Activity     Outcome Measure Help from another person eating meals?: A Lot Help from another person taking care of personal grooming?: A Little Help from another person toileting, which includes using toliet, bedpan, or urinal?: A Lot Help from another person bathing (including washing, rinsing, drying)?: A Lot Help from another person to put on and taking off regular upper body clothing?: A Lot Help from another person to put on and taking off regular lower body clothing?: A Lot 6 Click Score: 13   End of Session Nurse Communication: Mobility status  Activity Tolerance: Patient limited by fatigue Patient left: in bed;with call bell/phone within reach;with family/visitor present  OT Visit Diagnosis: Muscle weakness (generalized) (M62.81)                Time: 2202-5427 OT Time Calculation (min): 23 min Charges:  OT General Charges $OT Visit: 1 Procedure OT Evaluation $OT Eval Moderate Complexity: 1 Procedure OT Treatments $Therapeutic Exercise: 8-22 mins G-Codes:     Norman Herrlich, MS OTR/L  Pager: [place] A Avory Mimbs 06/07/2017, 4:07 PM

## 2017-06-07 NOTE — Progress Notes (Signed)
ANTICOAGULATION CONSULT NOTE - Follow Up Consult  Pharmacy Consult for warfarin Indication: atrial fibrillation  Allergies  Allergen Reactions  . Guaifenesin Er Palpitations    Pt can not take mucinex PM  . Prednisone Other (See Comments)    HIGH doses make patient feel "crazy"    Patient Measurements: Height: 5\' 8"  (172.7 cm) Weight: 161 lb 6.4 oz (73.2 kg) IBW/kg (Calculated) : 68.4  Vital Signs: Temp: 98.7 F (37.1 C) (07/08 0944) Temp Source: Oral (07/08 0944) BP: 116/52 (07/08 0944) Pulse Rate: 67 (07/08 0944)  Labs:  Recent Labs  06/04/17 1540 06/04/17 1910 06/04/17 2300 06/05/17 0516 06/06/17 0307 06/07/17 0607  HGB  --   --   --  8.9* 8.6* 8.4*  HCT  --   --   --  28.3* 27.8* 26.9*  PLT  --   --   --  236 240 214  LABPROT  --   --   --  31.5* 30.6* 27.8*  INR  --   --   --  2.96 2.86 2.54  CREATININE 2.57*  --   --  2.34* 2.20* 1.99*  TROPONINI <0.03 <0.03 <0.03  --   --   --     Estimated Creatinine Clearance: 23.9 mL/min (A) (by C-G formula based on SCr of 1.99 mg/dL (H)).   Medical History: Past Medical History:  Diagnosis Date  . Ataxia    followed by Dr. Erling Cruz  . Atrial fibrillation (HCC)    coumadin Rx  . BPH (benign prostatic hyperplasia)   . Hyperlipidemia   . Hypertension   . Hypothyroidism   . Peripheral neuropathy   . Sick sinus syndrome (HCC)    s/p PPM  . TIA (transient ischemic attack)    carotid dopplers 5/11: 0-39% bilateral; follow up due 03/2012   Assessment: 83 yo M presented to the ED with AMS. He is on chronic coumadin for history of afib. INR therapeutic on admission.    (PTA dose per [facility] clinic 5mg  MWF, 2.5mg  all other days - pt is confused and unable to answer questions about coumadin dose)  Goal of Therapy:  INR 2-3 Monitor platelets by anticoagulation protocol: Yes   Plan:  Warfarin 2.5mg  PO x 1 tonight Daily INR  [employer], Pharm.D., BCPS Clinical Pharmacist Pager: [phone] Clinical phone for  06/07/2017 from 8:30-4:00 is [phone]. After 4pm, please call Main Rx [phone]) for assistance. 06/07/2017 12:49 PM

## 2017-06-07 NOTE — Clinical Social Work Note (Signed)
Clinical Social Work Assessment  Patient Details  Name: [name] MRN: [id number] Date of Birth: Sep 29, 1924  Date of referral:  06/07/17               Reason for consult:  Facility Placement                Permission sought to share information with:  Family Supports Permission granted to share information::  Yes, Verbal Permission Granted  Name::     Opal Sidles and Phillip Heal  Agency::     Relationship::  Daughter and Wife  Contact Information:  Wife Travor Royce [phone]) and daughter Tory Emerald [phone]  Housing/Transportation Living arrangements for the past 2 months:  Single Family Home Source of Information:  Spouse, Adult Children Patient Interpreter Needed:  None Criminal Activity/Legal Involvement Pertinent to Current Situation/Hospitalization:  No - Comment as needed Significant Relationships:  Adult Children, Spouse Lives with:  Spouse Do you feel safe going back to the place where you live?  No Need for family participation in patient care:  Yes (Comment)  Care giving concerns:  Daughter Opal Sidles and spouse Hassan Rowan express concerns about the patient returning home at discharge. Family reports the patient was at [facility] last summer receiving great therapy everyday, however did not like the other services provided to the patient regarding his personal care and hygiene. Spouse and daughter express concerns about managing the patient's care at home.     Social Worker assessment / plan:  CSW received consult by RN to speak with family. CSW went to speak with patient regarding the possible need for short term rehab. CSW introduced self and acknowledged the patient. Patient is alert and orientedx2. Patient was calm during CSW assessment. Patient's spouse bad daughter Opal Sidles was at bedside. Patient provided CSW with permission to speak with family about disposition plans. CSW informed patient and family of PT recommendation for short term rehab. Patient and family is  agreeable to SNF placement. CSW was provided permission to fax clinical information out to the facilities in [facility]. Family's preference is [facility] (close to patient's home). CSW to complete FL2 for MD signature. CSW to initiate SNF placement process.    Employment status:  Retired, Disabled (Comment on whether or not currently receiving Disability) Insurance information:  Medicare PT Recommendations:  [place] / Referral to community resources:  [place]  Patient/Family's Response to care: Family is agreaable to plan for SNF placement. Family would also like to follow up regarding the possibility of having a RN come into the home to assist them with the catheter as another option. CSW followed up with RNCM and was informed that RN and CSW can be assigned to the home to assist the family. CSW made family aware. At this time, they would like to pursue SNF placement for the patient.   Patient/Family's Understanding of and Emotional Response to Diagnosis, Current Treatment, and Prognosis:  Family expresses understanding of the diagnosis, current treatment and prognosis. No other concerns were reported at this time. CSW will continue to follow.    Emotional Assessment Appearance:  Appears stated age Attitude/Demeanor/Rapport:  Unable to Assess Affect (typically observed):  Calm Orientation:  Oriented to Self, Oriented to Place Alcohol / Substance use:  Not Applicable Psych involvement (Current and /or in the community):  No (Comment)  Discharge Needs  Concerns to be addressed:  Discharge Planning Concerns Readmission within the last 30 days:  No Current discharge risk:  Physical Impairment Barriers to Discharge:  Continued Medical Work up   [employer], [place] 06/07/2017, 4:09 PM

## 2017-06-08 ENCOUNTER — Inpatient Hospital Stay (HOSPITAL_COMMUNITY): Payer: Medicare Other

## 2017-06-08 DIAGNOSIS — F411 Generalized anxiety disorder: Secondary | ICD-10-CM | POA: Diagnosis not present

## 2017-06-08 DIAGNOSIS — G9341 Metabolic encephalopathy: Secondary | ICD-10-CM | POA: Diagnosis not present

## 2017-06-08 DIAGNOSIS — E039 Hypothyroidism, unspecified: Secondary | ICD-10-CM | POA: Diagnosis not present

## 2017-06-08 DIAGNOSIS — R339 Retention of urine, unspecified: Secondary | ICD-10-CM | POA: Diagnosis not present

## 2017-06-08 DIAGNOSIS — I10 Essential (primary) hypertension: Secondary | ICD-10-CM

## 2017-06-08 DIAGNOSIS — G4700 Insomnia, unspecified: Secondary | ICD-10-CM | POA: Diagnosis not present

## 2017-06-08 DIAGNOSIS — N138 Other obstructive and reflux uropathy: Secondary | ICD-10-CM | POA: Diagnosis not present

## 2017-06-08 DIAGNOSIS — R7881 Bacteremia: Secondary | ICD-10-CM | POA: Diagnosis not present

## 2017-06-08 DIAGNOSIS — G9340 Encephalopathy, unspecified: Secondary | ICD-10-CM | POA: Diagnosis not present

## 2017-06-08 DIAGNOSIS — I4891 Unspecified atrial fibrillation: Secondary | ICD-10-CM | POA: Diagnosis not present

## 2017-06-08 DIAGNOSIS — R278 Other lack of coordination: Secondary | ICD-10-CM | POA: Diagnosis not present

## 2017-06-08 DIAGNOSIS — K5900 Constipation, unspecified: Secondary | ICD-10-CM | POA: Diagnosis not present

## 2017-06-08 DIAGNOSIS — R262 Difficulty in walking, not elsewhere classified: Secondary | ICD-10-CM | POA: Diagnosis not present

## 2017-06-08 DIAGNOSIS — R1312 Dysphagia, oropharyngeal phase: Secondary | ICD-10-CM | POA: Diagnosis not present

## 2017-06-08 DIAGNOSIS — F0390 Unspecified dementia without behavioral disturbance: Secondary | ICD-10-CM | POA: Diagnosis not present

## 2017-06-08 DIAGNOSIS — D649 Anemia, unspecified: Secondary | ICD-10-CM | POA: Diagnosis not present

## 2017-06-08 DIAGNOSIS — N401 Enlarged prostate with lower urinary tract symptoms: Secondary | ICD-10-CM | POA: Diagnosis not present

## 2017-06-08 DIAGNOSIS — F0151 Vascular dementia with behavioral disturbance: Secondary | ICD-10-CM | POA: Diagnosis not present

## 2017-06-08 DIAGNOSIS — E569 Vitamin deficiency, unspecified: Secondary | ICD-10-CM | POA: Diagnosis not present

## 2017-06-08 DIAGNOSIS — I119 Hypertensive heart disease without heart failure: Secondary | ICD-10-CM | POA: Diagnosis not present

## 2017-06-08 DIAGNOSIS — R05 Cough: Secondary | ICD-10-CM | POA: Diagnosis not present

## 2017-06-08 DIAGNOSIS — R001 Bradycardia, unspecified: Secondary | ICD-10-CM | POA: Diagnosis not present

## 2017-06-08 DIAGNOSIS — R0602 Shortness of breath: Secondary | ICD-10-CM | POA: Diagnosis not present

## 2017-06-08 DIAGNOSIS — I6930 Unspecified sequelae of cerebral infarction: Secondary | ICD-10-CM | POA: Diagnosis not present

## 2017-06-08 DIAGNOSIS — R52 Pain, unspecified: Secondary | ICD-10-CM | POA: Diagnosis not present

## 2017-06-08 DIAGNOSIS — R338 Other retention of urine: Secondary | ICD-10-CM | POA: Diagnosis not present

## 2017-06-08 DIAGNOSIS — S329XXA Fracture of unspecified parts of lumbosacral spine and pelvis, initial encounter for closed fracture: Secondary | ICD-10-CM | POA: Diagnosis not present

## 2017-06-08 DIAGNOSIS — I480 Paroxysmal atrial fibrillation: Secondary | ICD-10-CM | POA: Diagnosis not present

## 2017-06-08 DIAGNOSIS — L03113 Cellulitis of right upper limb: Secondary | ICD-10-CM | POA: Diagnosis not present

## 2017-06-08 DIAGNOSIS — R2689 Other abnormalities of gait and mobility: Secondary | ICD-10-CM | POA: Diagnosis not present

## 2017-06-08 DIAGNOSIS — J9600 Acute respiratory failure, unspecified whether with hypoxia or hypercapnia: Secondary | ICD-10-CM | POA: Diagnosis not present

## 2017-06-08 DIAGNOSIS — R609 Edema, unspecified: Secondary | ICD-10-CM | POA: Diagnosis not present

## 2017-06-08 DIAGNOSIS — G0490 Encephalitis and encephalomyelitis, unspecified: Secondary | ICD-10-CM | POA: Diagnosis not present

## 2017-06-08 DIAGNOSIS — S8011XA Contusion of right lower leg, initial encounter: Secondary | ICD-10-CM | POA: Diagnosis not present

## 2017-06-08 DIAGNOSIS — E785 Hyperlipidemia, unspecified: Secondary | ICD-10-CM | POA: Diagnosis not present

## 2017-06-08 DIAGNOSIS — N320 Bladder-neck obstruction: Secondary | ICD-10-CM | POA: Diagnosis not present

## 2017-06-08 DIAGNOSIS — M6281 Muscle weakness (generalized): Secondary | ICD-10-CM | POA: Diagnosis not present

## 2017-06-08 DIAGNOSIS — R5381 Other malaise: Secondary | ICD-10-CM | POA: Diagnosis not present

## 2017-06-08 DIAGNOSIS — R4182 Altered mental status, unspecified: Secondary | ICD-10-CM | POA: Diagnosis not present

## 2017-06-08 DIAGNOSIS — F419 Anxiety disorder, unspecified: Secondary | ICD-10-CM | POA: Diagnosis not present

## 2017-06-08 DIAGNOSIS — H04129 Dry eye syndrome of unspecified lacrimal gland: Secondary | ICD-10-CM | POA: Diagnosis not present

## 2017-06-08 LAB — CBC
HCT: 26.7 % — ABNORMAL LOW (ref 39.0–52.0)
HCT: 27.4 % — ABNORMAL LOW (ref 39.0–52.0)
HEMOGLOBIN: 8.5 g/dL — AB (ref 13.0–17.0)
Hemoglobin: 8.7 g/dL — ABNORMAL LOW (ref 13.0–17.0)
MCH: 31.5 pg (ref 26.0–34.0)
MCH: 31 pg (ref 26.0–34.0)
MCHC: 31.8 g/dL (ref 30.0–36.0)
MCHC: 31.8 g/dL (ref 30.0–36.0)
MCV: 98.9 fL (ref 78.0–100.0)
MCV: 97.5 fL (ref 78.0–100.0)
PLATELETS: 227 10*3/uL (ref 150–400)
Platelets: 216 10*3/uL (ref 150–400)
RBC: 2.7 MIL/uL — AB (ref 4.22–5.81)
RBC: 2.81 MIL/uL — ABNORMAL LOW (ref 4.22–5.81)
RDW: 14.1 % (ref 11.5–15.5)
RDW: 13.7 % (ref 11.5–15.5)
WBC: 6.7 10*3/uL (ref 4.0–10.5)
WBC: 7.4 10*3/uL (ref 4.0–10.5)

## 2017-06-08 LAB — COMPREHENSIVE METABOLIC PANEL
ALBUMIN: 2.9 g/dL — AB (ref 3.5–5.0)
ALK PHOS: 66 U/L (ref 38–126)
ALT: 15 U/L — AB (ref 17–63)
AST: 19 U/L (ref 15–41)
Anion gap: 5 (ref 5–15)
BUN: 32 mg/dL — AB (ref 6–20)
CALCIUM: 8.1 mg/dL — AB (ref 8.9–10.3)
CO2: 23 mmol/L (ref 22–32)
CREATININE: 1.91 mg/dL — AB (ref 0.61–1.24)
Chloride: 108 mmol/L (ref 101–111)
GFR calc Af Amer: 34 mL/min — ABNORMAL LOW (ref >60)
GFR calc non Af Amer: 29 mL/min — ABNORMAL LOW (ref >60)
GLUCOSE: 99 mg/dL (ref 65–99)
Potassium: 4.5 mmol/L (ref 3.5–5.1)
SODIUM: 136 mmol/L (ref 135–145)
Total Bilirubin: 0.6 mg/dL (ref 0.3–1.2)
Total Protein: 6 g/dL — ABNORMAL LOW (ref 6.5–8.1)

## 2017-06-08 LAB — PROTIME-INR
INR: 2.14
PROTHROMBIN TIME: 24.2 s — AB (ref 11.4–15.2)

## 2017-06-08 MED ORDER — LEVOTHYROXINE SODIUM 75 MCG PO TABS
75.0000 ug | ORAL_TABLET | Freq: Every day | ORAL | Status: DC
  Administered 2017-06-08: 75 ug via ORAL
  Filled 2017-06-08: qty 1

## 2017-06-08 MED ORDER — SENNOSIDES-DOCUSATE SODIUM 8.6-50 MG PO TABS: 1.0000 | ORAL_TABLET | Freq: Every evening | ORAL | Status: AC | PRN

## 2017-06-08 MED ORDER — WARFARIN SODIUM 5 MG PO TABS
5.0000 mg | ORAL_TABLET | Freq: Once | ORAL | Status: AC
  Administered 2017-06-08: 5 mg via ORAL
  Filled 2017-06-08: qty 1

## 2017-06-08 MED ORDER — LEVOTHYROXINE SODIUM 25 MCG PO TABS: 75.0000 ug | ORAL_TABLET | Freq: Every day | ORAL | Status: AC

## 2017-06-08 MED ORDER — IPRATROPIUM-ALBUTEROL 0.5-2.5 (3) MG/3ML IN SOLN: 3.0000 mL | Freq: Two times a day (BID) | RESPIRATORY_TRACT | Status: DC

## 2017-06-08 MED ORDER — TAMSULOSIN HCL 0.4 MG PO CAPS: 0.8000 mg | ORAL_CAPSULE | Freq: Every day | ORAL | Status: AC

## 2017-06-08 MED ORDER — IPRATROPIUM-ALBUTEROL 0.5-2.5 (3) MG/3ML IN SOLN
3.0000 mL | Freq: Four times a day (QID) | RESPIRATORY_TRACT | Status: DC
  Administered 2017-06-08 (×2): 3 mL via RESPIRATORY_TRACT
  Filled 2017-06-08 (×2): qty 3

## 2017-06-08 NOTE — Progress Notes (Signed)
Patient will DC to: [facility] Anticipated DC date: 06/08/17 Family notified: Daughter, Amy Transport by: Bess Kinds   Per MD patient ready for DC to [facility]. RN, patient, patient's family, and facility notified of DC. Discharge Summary sent to facility. RN given number for report [phone]). DC packet on chart. Ambulance transport requested for patient.   CSW signing off.  Cedric Fishman, [place] Social Worker [phone]

## 2017-06-08 NOTE — Evaluation (Signed)
Clinical/Bedside Swallow Evaluation Patient Details  Name: [name] MRN: [id number] Date of Birth: 1924-05-09  Today's Date: 06/08/2017 Time: SLP Start Time (ACUTE ONLY): 4098 SLP Stop Time (ACUTE ONLY): 0946 SLP Time Calculation (min) (ACUTE ONLY): 24 min  Past Medical History:  Past Medical History:  Diagnosis Date  . Ataxia    followed by Dr. Erling Cruz  . Atrial fibrillation (HCC)    coumadin Rx  . BPH (benign prostatic hyperplasia)   . Hyperlipidemia   . Hypertension   . Hypothyroidism   . Peripheral neuropathy   . Sick sinus syndrome (HCC)    s/p PPM  . TIA (transient ischemic attack)    carotid dopplers 5/11: 0-39% bilateral; follow up due 03/2012   Past Surgical History:  Past Surgical History:  Procedure Laterality Date  . INSERT / REPLACE / REMOVE PACEMAKER    . PACEMAKER INSERTION     implanted 10/96, most recent Gen Change by Dr Olevia Perches 8/08  . TEE WITHOUT CARDIOVERSION N/A 05/29/2016   Procedure: TRANSESOPHAGEAL ECHOCARDIOGRAM (TEE);  Surgeon: Larey Dresser, MD;  Location: [facility];  Service: Cardiovascular;  Laterality: N/A;   HPI:  Patient is a 83 yo male admitted 06/04/17 with confusion and urinary retention. Patient with metabolic encephalopathy, renal failure due to bladder outlet obstruction. PMH: dysphagia, SSS s/p PM, HTN, Afib. CXR Bibasilar scarring. No active disease. CT no acute finding or change from 2017, chronic small vessel ischemia with remote bilateral cerebellar and right occipital infarcts. No ST documentation found.   Assessment / Plan / Recommendation Clinical Impression  Pt and daughter state he sometimes coughs at home but has increased since hospital admit. Pt reports "they were feeding me big bites." Pt coughed x 1 during assessment immediately afte phonating. SLP suspects positional/behavioral factors leading to intermittent airway intrusion. Objective testing not needed at present. Educated pt and daughter re: safe swallow  precautions (sit upright, NO STRAWS, small bites/sips, pt self-feed, no talking after swallows). These changes and increased focus during meals should mitigate pt's chronic aspiration risk. Educated signs to be concerned for infection or worsening dysphagia; pt could be seen as outpt for MBS if needed. Recommend DYs 3, thin. Will follow up.   SLP Visit Diagnosis: Dysphagia, unspecified (R13.10)    Aspiration Risk  Moderate aspiration risk    Diet Recommendation Dysphagia 3 (Mech soft);Thin liquid   Liquid Administration via: Cup;No straw Medication Administration: Whole meds with puree Supervision: Patient able to self feed;Intermittent supervision to cue for compensatory strategies Compensations: Slow rate;Small sips/bites;Minimize environmental distractions Postural Changes: Seated upright at 90 degrees    Other  Recommendations Oral Care Recommendations: Oral care BID   Follow up Recommendations None      Frequency and Duration min 2x/week  2 weeks       Prognosis Prognosis for Safe Diet Advancement:  (fair-good)      Swallow Study   General HPI: Patient is a 83 yo male admitted 06/04/17 with confusion and urinary retention. Patient with metabolic encephalopathy, renal failure due to bladder outlet obstruction. PMH: dysphagia, SSS s/p PM, HTN, Afib. CXR Bibasilar scarring. No active disease. CT no acute finding or change from 2017, chronic small vessel ischemia with remote bilateral cerebellar and right occipital infarcts. No ST documentation found. Type of Study: Bedside Swallow Evaluation Previous Swallow Assessment:  (none found) Diet Prior to this Study: Dysphagia 2 (chopped);Thin liquids Temperature Spikes Noted: Yes Respiratory Status: Nasal cannula History of Recent Intubation: No Behavior/Cognition: Alert;Cooperative;Pleasant mood (HOH) Oral Cavity Assessment:  Within Functional Limits Oral Care Completed by SLP: No Oral Cavity - Dentition: Dentures, top;Dentures,  bottom Vision: Functional for self-feeding Self-Feeding Abilities: Able to feed self;Needs set up Patient Positioning: Upright in bed Baseline Vocal Quality: Normal Volitional Cough: Strong Volitional Swallow: Able to elicit    Oral/Motor/Sensory Function Overall Oral Motor/Sensory Function: Within functional limits   Ice Chips Ice chips: Not tested   Thin Liquid Thin Liquid: Within functional limits Presentation: Cup;Straw    Nectar Thick Nectar Thick Liquid: Not tested   Honey Thick Honey Thick Liquid: Not tested   Puree Puree: Within functional limits   Solid   GO   Solid: Within functional limits        Houston Siren 06/08/2017,10:04 AM  Orbie Pyo Colvin Caroli.Ed [employer] [phone]

## 2017-06-08 NOTE — Progress Notes (Signed)
Physical Therapy Treatment Patient Details Name: Dom Haverland MRN: [id number] DOB: [birth year] Today's Date: 06/08/2017    History of Present Illness Patient is a 83 yo male admitted 06/04/17 with confusion and urinary retention.  Patient with metabolic encephalopathy, renal failure due to bladder outlet obstruction.    PMH:  SSS s/p PM, HTN, Afib    PT Comments    Pt able to stand with mod A +2 to stedy which was used to pivot to chair. Pt was at first resistant to attempting transfer but then glad to be OOB.Continue to strongly recommend SNF at d/c.   PT will continue to follow.    Follow Up Recommendations  SNF;Supervision/Assistance - 24 hour     Equipment Recommendations  None recommended by PT    Recommendations for Other Services       Precautions / Restrictions Precautions Precautions: Fall Restrictions Weight Bearing Restrictions: No    Mobility  Bed Mobility Overal bed mobility: Needs Assistance Bed Mobility: Supine to Sit     Supine to sit: Mod assist     General bed mobility comments: pt able to begin LE mvmt to side of bed but mod A for LE's all the way off. Mod A for trunk elevation from bed to sitting and mod A to scoot hips to EOB  Transfers Overall transfer level: Needs assistance Equipment used: Ambulation equipment used Transfers: Sit to/from [facility] Sit to Stand: Mod assist;+2 physical assistance;From elevated surface Stand pivot transfers: Total assist;+2 safety/equipment       General transfer comment: stedy used for transfer bed to chair. Mod A +2 for fwd wt shift and power up from bed. Pt barely able to get straight enough to put flaps down on stedy  Ambulation/Gait             General Gait Details: Unable   Stairs            Wheelchair Mobility    Modified Rankin (Stroke Patients Only)       Balance Overall balance assessment: Needs assistance Sitting-balance support: Single extremity  supported;Feet supported Sitting balance-Leahy Scale: Fair Sitting balance - Comments: pt required min A to gain balance EOB but was then able to maintain sitting with unilateral support without assist and help with scooting fwd   Standing balance support: Bilateral upper extremity supported Standing balance-Leahy Scale: Zero Standing balance comment: use of lift equipment to maintain standing                            Cognition Arousal/Alertness: Awake/alert Behavior During Therapy: WFL for tasks assessed/performed;Flat affect Overall Cognitive Status: History of cognitive impairments - at baseline                                 General Comments: pleasantly confused but following all commands. HOH so sometimes things need to be repeated for him      Exercises      General Comments        Pertinent Vitals/Pain Pain Assessment: Faces Faces Pain Scale: Hurts even more Pain Location: sore all over and  RUE specifically Pain Descriptors / Indicators: Aching;Sore Pain Intervention(s): Limited activity within patient's tolerance;Monitored during session    Home Living                      Prior Function  PT Goals (current goals can now be found in the care plan section) Acute Rehab PT Goals Patient Stated Goal: None stated PT Goal Formulation: With patient Time For Goal Achievement: 06/20/17 Potential to Achieve Goals: Fair Progress towards PT goals: Progressing toward goals    Frequency    Min 2X/week      PT Plan Current plan remains appropriate;Frequency needs to be updated    Co-evaluation              AM-PAC PT "6 Clicks" Daily Activity  Outcome Measure  Difficulty turning over in bed (including adjusting bedclothes, sheets and blankets)?: Total Difficulty moving from lying on back to sitting on the side of the bed? : Total Difficulty sitting down on and standing up from a chair with arms (e.g.,  wheelchair, bedside commode, etc,.)?: Total Help needed moving to and from a bed to chair (including a wheelchair)?: Total Help needed walking in hospital room?: Total Help needed climbing 3-5 steps with a railing? : Total 6 Click Score: 6    End of Session Equipment Utilized During Treatment: Gait belt;Oxygen Activity Tolerance: Patient limited by fatigue Patient left: in chair;with call bell/phone within reach;with family/visitor present Nurse Communication: Mobility status;Need for lift equipment (maximove for return to bed) PT Visit Diagnosis: Unsteadiness on feet (R26.81);Difficulty in walking, not elsewhere classified (R26.2);Muscle weakness (generalized) (M62.81)     Time: 3810-1751 PT Time Calculation (min) (ACUTE ONLY): 19 min  Charges:                       G Codes:       Leighton Roach, PT  Acute Rehab Services  [place] 06/08/2017, 12:32 PM

## 2017-06-08 NOTE — Care Management Important Message (Signed)
Important Message  Patient Details  Name: [name] MRN: [id number] Date of Birth: 11-03-1924   Medicare Important Message Given:  Yes    Murna Backer 06/08/2017, 3:19 PM

## 2017-06-08 NOTE — Discharge Summary (Signed)
Physician Discharge Summary  [name] KKX:[id number] DOB: 07/28/1924 DOA: 06/04/2017  PCP: Merrilee Seashore, MD  Admit date: 06/04/2017 Discharge date: 06/08/2017  Admitted From: (Home) Disposition: SNIF   Recommendations for Outpatient Follow-up:   1. Follow up with PCP in 1-2 weeks to check TSH/CMP .   Home Health: NO    Discharge Condition:(guarded)  CODE STATUS:(, DNR)  Diet recommendation: Heart Healthy/ Dysphagia   Brief/Interim Summary:  83 year old male with a past medical history of hypertension hyperlipidemia , STROKE with residual dysphagia , hypothyroidism  , BPH , Afib presents to the hospital with brief episodes of confusion was found to have urinary retention of 850cc s/p Foley insertion with subsequent improvement in his mental status and kidney function . TOV has failed and foley reinserted . Patient was found to have low FT4 with elevated TSH compatible with hypothyroidisim , Levothyroxine has increased and F/U as outpt with the PCP has recommended , patient will be discharged to the rehab for deconditioning .  Discharge Diagnoses:   1- Acute metabolic encephalopathy : 2/2 renal failure 2/2 bladder outlet obstruction( BPH)  s/p foley insertion , creat has been improving , avoid nephrotoxic agents , trial void has failed , Foley reinserted, continue Flomax , conservative approach to his BPH with his age and comorbidities. 2- Hx of SSS S/P PM : Stable. 3- Hx of Hypothyroidisim : Severe elevated TSH reflective of subtherapeutic levothyroxine and profound hypothyroidisim we are going to increase the dose to 75 g every morning , need to follow up with TSH as an outpt . 4- Hx of Afib : Rate is controlled continue amiodarone and warfarin INR is therapeutic. 5- History of stroke with residual dysphagia : puree Diet has been recommended .    Discharge Instructions    Contact information for after-discharge care    Destination    [facility] .    Specialty:  [facility] information: [address] [place] [phone]             Allergies  Allergen Reactions  . Guaifenesin Er Palpitations    Pt can not take mucinex PM  . Prednisone Other (See Comments)    HIGH doses make patient feel "crazy"    Consultations:  NONE     Procedures/Studies: Ct Head Wo Contrast  Result Date: 06/04/2017 CLINICAL DATA:  Confusion.  Possible syncope. EXAM: CT HEAD WITHOUT CONTRAST TECHNIQUE: Contiguous axial images were obtained from the base of the skull through the vertex without intravenous contrast. COMPARISON:  05/22/2016 FINDINGS: Brain: No evidence of acute infarction, hemorrhage, hydrocephalus, extra-axial collection or mass lesion/mass effect. Advanced chronic small vessel ischemia with confluent white matter gliosis and remote lacunar infarcts in the right centrum semiovale and right subcortical frontal parietal white matter. Remote small cortical infarct involving the parasagittal right occipital lobe. Small to moderate left larger than right cerebellar infarcts. Vascular: Atherosclerotic calcification.  No hyperdense vessel. Skull: No acute or aggressive finding. Sinuses/Orbits: Cataract resections and enophthalmos. IMPRESSION: 1. No acute finding or change from 2017. 2. Chronic small vessel ischemia with remote bilateral cerebellar and right occipital infarcts. Electronically Signed   By: Monte Fantasia M.D.   On: 06/04/2017 15:14   Dg Chest Port 1 View  Result Date: 06/08/2017 CLINICAL DATA:  Increased shortness of breath, cough EXAM: PORTABLE CHEST 1 VIEW COMPARISON:  06/04/2017 FINDINGS: Right pacer remains in place, unchanged. Heart is borderline in size. Bibasilar scarring. No acute airspace opacities or effusions. No acute  bony abnormality. IMPRESSION: Bibasilar scarring.  No active disease. Electronically Signed   By: Rolm Baptise M.D.   On: 06/08/2017 08:15   Dg Chest  Portable 1 View  Result Date: 06/04/2017 CLINICAL DATA:  Unresponsive. EXAM: PORTABLE CHEST 1 VIEW COMPARISON:  05/23/2016. FINDINGS: Cardiac pacer with lead tips in right atrium right ventricle. Cardiomegaly with normal pulmonary vascularity. No focal infiltrate. Low lung volumes with mild basilar atelectasis. Punctate calcific density noted over the left lung consistent granuloma . No pleural effusion or pneumothorax. Calcified nodular densities in the left mid lung. Degenerative changes thoracic spine . IMPRESSION: 1. Cardiac pacer with lead tips in the right atrium and the right ventricle. 2. Cardiomegaly . Electronically Signed   By: Marcello Moores  Register   On: 06/04/2017 13:15         Discharge Exam: Vitals:   06/08/17 1400 06/08/17 1437  BP:    Pulse: 69 66  Resp: 18 18  Temp:  98.4 F (36.9 C)   Vitals:   06/08/17 0618 06/08/17 0843 06/08/17 1400 06/08/17 1437  BP: 140/66     Pulse: (!) 59 61 69 66  Resp: 19 18 18 18   Temp: 99.2 F (37.3 C)   98.4 F (36.9 C)  TempSrc: Oral   Oral  SpO2: 99% 98% 98% 98%  Weight:      Height:        General: NAD. Cardiovascular: RRR, S1/S2 +, no rubs, no gallops Respiratory:Clear to auscultation B/L. Abdominal: Soft, NT, ND, bowel sounds + Extremities: no edema, no cyanosis    The results of significant diagnostics from this hospitalization (including imaging, microbiology, ancillary and laboratory) are listed below for reference.     Microbiology: No results found for this or any previous visit (from the past 240 hour(s)).   Labs: BNP (last 3 results)  Recent Labs  06/04/17 1315  BNP 169.4*   Basic Metabolic Panel:  Recent Labs Lab 06/04/17 1315 06/04/17 1540 06/05/17 0516 06/06/17 0307 06/07/17 0607 06/08/17 0530  NA 137 135 138 139 136 136  K 4.8 5.5* 4.7 4.6 4.6 4.5  CL 107 105 110 110 109 108  CO2 18* 23 22 22 23 23   GLUCOSE 105* 86 105* 107* 112* 99  BUN 45* 46* 40* 36* 33* 32*  CREATININE 2.64* 2.57*  2.34* 2.20* 1.99* 1.91*  CALCIUM 8.9 8.6* 8.2* 8.1* 8.2* 8.1*  MG 2.3  --   --   --   --   --   PHOS  --  4.1  --   --   --   --    Liver Function Tests:  Recent Labs Lab 06/04/17 1315 06/04/17 1540 06/06/17 0307 06/07/17 0607 06/08/17 0530  AST 32 36 20 19 19   ALT 21 23 15* 14* 15*  ALKPHOS 74 74 68 72 66  BILITOT 0.5 0.7 0.5 0.4 0.6  PROT 7.0 6.7 5.7* 5.4* 6.0*  ALBUMIN 4.0 3.8 3.0* 2.7* 2.9*   No results for input(s): LIPASE, AMYLASE in the last 168 hours.  Recent Labs Lab 06/04/17 1910  AMMONIA 14   CBC:  Recent Labs Lab 06/04/17 1315 06/05/17 0516 06/06/17 0307 06/07/17 0607 06/08/17 0530 06/08/17 0754  WBC 5.5 6.7 6.9 6.2 6.7 7.4  NEUTROABS 3.2 4.2  --   --   --   --   HGB 10.1* 8.9* 8.6* 8.4* 8.5* 8.7*  HCT 31.5* 28.3* 27.8* 26.9* 26.7* 27.4*  MCV 97.2 99.3 100.0 99.3 98.9 97.5  PLT 257 236 240  214 216 227   Cardiac Enzymes:  Recent Labs Lab 06/04/17 1315 06/04/17 1540 06/04/17 1910 06/04/17 2300  TROPONINI <0.03 <0.03 <0.03 <0.03   BNP: Invalid input(s): POCBNP CBG: No results for input(s): GLUCAP in the last 168 hours. D-Dimer No results for input(s): DDIMER in the last 72 hours. Hgb A1c No results for input(s): HGBA1C in the last 72 hours. Lipid Profile No results for input(s): CHOL, HDL, LDLCALC, TRIG, CHOLHDL, LDLDIRECT in the last 72 hours. Thyroid function studies  Recent Labs  06/06/17 1425  TSH 62.892*   Anemia work up No results for input(s): VITAMINB12, FOLATE, FERRITIN, TIBC, IRON, RETICCTPCT in the last 72 hours. Urinalysis    Component Value Date/Time   COLORURINE YELLOW 06/04/2017 1618   APPEARANCEUR CLEAR 06/04/2017 1618   LABSPEC 1.011 06/04/2017 1618   PHURINE 6.0 06/04/2017 1618   GLUCOSEU NEGATIVE 06/04/2017 1618   GLUCOSEU NEGATIVE 09/28/2008 1243   HGBUR NEGATIVE 06/04/2017 1618   BILIRUBINUR NEGATIVE 06/04/2017 1618   KETONESUR NEGATIVE 06/04/2017 1618   PROTEINUR NEGATIVE 06/04/2017 1618    UROBILINOGEN 0.2 mg/dL 09/28/2008 1243   NITRITE NEGATIVE 06/04/2017 1618   LEUKOCYTESUR NEGATIVE 06/04/2017 1618   Sepsis Labs Invalid input(s): PROCALCITONIN,  WBC,  LACTICIDVEN Microbiology No results found for this or any previous visit (from the past 240 hour(s)).   Time coordinating discharge: Over 30 minutes  SIGNED:   Waldron Session, MD  [facility] 06/08/2017, 3:07 PM Pager   If 7PM-7AM, please contact night-coverage [email or web address] Password [email or web address]

## 2017-06-08 NOTE — Progress Notes (Signed)
ANTICOAGULATION CONSULT NOTE - Follow Up Consult  Pharmacy Consult for warfarin Indication: atrial fibrillation  Allergies  Allergen Reactions  . Guaifenesin Er Palpitations    Pt can not take mucinex PM  . Prednisone Other (See Comments)    HIGH doses make patient feel "crazy"    Patient Measurements: Height: 5\' 8"  (172.7 cm) Weight: 161 lb 6.4 oz (73.2 kg) IBW/kg (Calculated) : 68.4  Vital Signs: Temp: 99.2 F (37.3 C) (07/09 0618) Temp Source: Oral (07/09 0618) BP: 140/66 (07/09 0618) Pulse Rate: 69 (07/09 1400)  Labs:  Recent Labs  06/06/17 0307 06/07/17 0607 06/08/17 0530 06/08/17 0754  HGB 8.6* 8.4* 8.5* 8.7*  HCT 27.8* 26.9* 26.7* 27.4*  PLT 240 214 216 227  LABPROT 30.6* 27.8* 24.2*  --   INR 2.86 2.54 2.14  --   CREATININE 2.20* 1.99* 1.91*  --     Estimated Creatinine Clearance: 24.9 mL/min (A) (by C-G formula based on SCr of 1.91 mg/dL (H)).   Medical History: Past Medical History:  Diagnosis Date  . Ataxia    followed by Dr. Erling Cruz  . Atrial fibrillation (HCC)    coumadin Rx  . BPH (benign prostatic hyperplasia)   . Hyperlipidemia   . Hypertension   . Hypothyroidism   . Peripheral neuropathy   . Sick sinus syndrome (HCC)    s/p PPM  . TIA (transient ischemic attack)    carotid dopplers 5/11: 0-39% bilateral; follow up due 03/2012   Assessment: 83 yo M presented to the ED with AMS. He is on chronic coumadin for history of afib. INR therapeutic on admission.  INR trending down today - due for higher dose of Warfarin based on PTA regimen.  (PTA dose per [facility] clinic 5mg  MWF, 2.5mg  all other days - pt is confused and unable to answer questions about coumadin dose)  Goal of Therapy:  INR 2-3 Monitor platelets by anticoagulation protocol: Yes   Plan:  Warfarin 5mg  PO x 1 tonight Daily INR  [employer], Pharm.D., BCPS Clinical Pharmacist Pager: [phone] Clinical phone for 06/08/2017 from 8:30-4:00 is [phone]. After 4pm, please call  Main Rx [phone]) for assistance. 06/08/2017 2:11 PM

## 2017-06-08 NOTE — Clinical Social Work Placement (Signed)
   CLINICAL SOCIAL WORK PLACEMENT  NOTE  Date:  06/08/2017  Patient Details  Name: [name] MRN: [id number] Date of Birth: [birth year]  Clinical Social Work is seeking post-discharge placement for this patient at the [place] level of care (*CSW will initial, date and re-position this form in  chart as items are completed):  Yes   Patient/family provided with [place] Work Department's list of facilities offering this level of care within the geographic area requested by the patient (or if unable, by the patient's family).  Yes   Patient/family informed of their freedom to choose among providers that offer the needed level of care, that participate in Medicare, Medicaid or managed care program needed by the patient, have an available bed and are willing to accept the patient.  Yes   Patient/family informed of [place]'s ownership interest in [facility] and [facility], as well as of the fact that they are under no obligation to receive care at these facilities.  PASRR submitted to EDS on       PASRR number received on       Existing PASRR number confirmed on 06/08/17     FL2 transmitted to all facilities in geographic area requested by pt/family on 06/08/17     FL2 transmitted to all facilities within larger geographic area on       Patient informed that his/her managed care company has contracts with or will negotiate with certain facilities, including the following:        Yes   Patient/family informed of bed offers received.  Patient chooses bed at [facility]     Physician recommends and patient chooses bed at      Patient to be transferred to [facility] on 06/08/17.  Patient to be transferred to facility by [facility]     Patient family notified on 06/08/17 of transfer.  Name of family member notified:  Amy     PHYSICIAN Please sign FL2, Please prepare priority discharge summary, including medications     Additional  Comment:    _______________________________________________ Benard Halsted, LCSWA 06/08/2017, 12:27 PM

## 2017-06-08 NOTE — Progress Notes (Signed)
  Speech Language Pathology t:    Patient Details Name: [name] MRN: [id number] DOB: 26-Aug-1924 Today's Date: 06/08/2017 Time: 7373-6681 SLP Time Calculation (min) (ACUTE ONLY): 24 min    Bedside swallow eval completed (see note). Speech-cognitive-language assessment ordered. No changes or complaints. Full evaluation not needed in these areas. Will follow for dysphagia.    Orbie Pyo [place].[employer] [phone]

## 2017-06-08 NOTE — Discharge Summary (Signed)
Physician Discharge Summary  [name] HWE:[id number] DOB: [birth year] DOA: 06/04/2017    [name] ELF:[id number] DOB: 1924/04/29 DOA: 06/04/2017  PCP: Merrilee Seashore, MD  Admit date: 06/04/2017 Discharge date: 06/08/2017  Admitted From: (Home) Disposition: SNIF   Recommendations for Outpatient Follow-up:   1. Follow up with PCP in 1-2 weeks to check TSH/CMP .   Home Health: NO    Discharge Condition:(guarded)  CODE STATUS:(, DNR)  Diet recommendation: Heart Healthy/ Dysphagia   Brief/Interim Summary:  83 year old male with a past medical history of hypertension hyperlipidemia , STROKE with residual dysphagia , hypothyroidism  , BPH , Afib presents to the hospital with brief episodes of confusion was found to have urinary retention of 850cc s/p Foley insertion with subsequent improvement in his mental status and kidney function . TOV has failed and foley reinserted . Patient was found to have low FT4 with elevated TSH compatible with hypothyroidisim , Levothyroxine has increased and F/U as outpt with the PCP has recommended , patient will be discharged to the rehab for deconditioning .  Discharge Diagnoses:   1- Acute metabolic encephalopathy : 2/2 renal failure 2/2 bladder outlet obstruction( BPH) s/p foley insertion , creat has been improving , avoid nephrotoxic agents , trial void has failed , Foley reinserted, continue Flomax , conservative approach to his BPH with his age and comorbidities. 2- Hx of SSS S/P PM : Stable. 3- Hx of Hypothyroidisim :Severe elevated TSH reflective of subtherapeutic levothyroxine and profound hypothyroidisimwe are going to increase the dose to 75 g every morning , need to follow up with TSH as an outpt . 4- Hx of Afib : Rate is controlled continue amiodarone and warfarin INR is therapeutic. 5- History of stroke with residual dysphagia : puree Diet has been recommended .       Discharge Instructions  Discharge  Instructions    Diet - low sodium heart healthy    Complete by:  As directed    Dysphagia diet/Puree diet /slow chew small bite.   Increase activity slowly    Complete by:  As directed      Allergies as of 06/08/2017      Reactions   Guaifenesin Er Palpitations   Pt can not take mucinex PM   Prednisone Other (See Comments)   HIGH doses make patient feel "crazy"      Medication List    TAKE these medications   amiodarone 200 MG tablet Commonly known as:  PACERONE Take 0.5 tablets (100 mg total) by mouth daily.   levothyroxine 25 MCG tablet Commonly known as:  SYNTHROID, LEVOTHROID Take 3 tablets (75 mcg total) by mouth daily before breakfast. What changed:  how much to take   senna-docusate 8.6-50 MG tablet Commonly known as:  Senokot-S Take 1 tablet by mouth at bedtime as needed for mild constipation.   tamsulosin 0.4 MG Caps capsule Commonly known as:  FLOMAX Take 2 capsules (0.8 mg total) by mouth daily. Start taking on:  06/09/2017   warfarin 5 MG tablet Commonly known as:  COUMADIN Take 0.5-1 tablets (2.5-5 mg total) by mouth as directed. Take 2.5 mg (0.5 tablet) on Tues, Thurs, Sat and Sun and Take 5 mg (1 tablet) on Mon, Wed and Fri      Contact information for after-discharge care    Destination    [facility] .   Specialty:  [place] information: [address] [place] [phone]  Allergies  Allergen Reactions  . Guaifenesin Er Palpitations    Pt can not take mucinex PM  . Prednisone Other (See Comments)    HIGH doses make patient feel "crazy"    Consultations: none  Procedures/Studies: Ct Head Wo Contrast  Result Date: 06/04/2017 CLINICAL DATA:  Confusion.  Possible syncope. EXAM: CT HEAD WITHOUT CONTRAST TECHNIQUE: Contiguous axial images were obtained from the base of the skull through the vertex without intravenous contrast. COMPARISON:  05/22/2016 FINDINGS: Brain:  No evidence of acute infarction, hemorrhage, hydrocephalus, extra-axial collection or mass lesion/mass effect. Advanced chronic small vessel ischemia with confluent white matter gliosis and remote lacunar infarcts in the right centrum semiovale and right subcortical frontal parietal white matter. Remote small cortical infarct involving the parasagittal right occipital lobe. Small to moderate left larger than right cerebellar infarcts. Vascular: Atherosclerotic calcification.  No hyperdense vessel. Skull: No acute or aggressive finding. Sinuses/Orbits: Cataract resections and enophthalmos. IMPRESSION: 1. No acute finding or change from 2017. 2. Chronic small vessel ischemia with remote bilateral cerebellar and right occipital infarcts. Electronically Signed   By: Monte Fantasia M.D.   On: 06/04/2017 15:14   Dg Chest Port 1 View  Result Date: 06/08/2017 CLINICAL DATA:  Increased shortness of breath, cough EXAM: PORTABLE CHEST 1 VIEW COMPARISON:  06/04/2017 FINDINGS: Right pacer remains in place, unchanged. Heart is borderline in size. Bibasilar scarring. No acute airspace opacities or effusions. No acute bony abnormality. IMPRESSION: Bibasilar scarring.  No active disease. Electronically Signed   By: Rolm Baptise M.D.   On: 06/08/2017 08:15   Dg Chest Portable 1 View  Result Date: 06/04/2017 CLINICAL DATA:  Unresponsive. EXAM: PORTABLE CHEST 1 VIEW COMPARISON:  05/23/2016. FINDINGS: Cardiac pacer with lead tips in right atrium right ventricle. Cardiomegaly with normal pulmonary vascularity. No focal infiltrate. Low lung volumes with mild basilar atelectasis. Punctate calcific density noted over the left lung consistent granuloma . No pleural effusion or pneumothorax. Calcified nodular densities in the left mid lung. Degenerative changes thoracic spine . IMPRESSION: 1. Cardiac pacer with lead tips in the right atrium and the right ventricle. 2. Cardiomegaly . Electronically Signed   By: Marcello Moores  Register   On:  06/04/2017 13:15    (Echo, Carotid, EGD, Colonoscopy, ERCP)    Subjective:   Discharge Exam: Vitals:   06/08/17 1400 06/08/17 1437  BP:    Pulse: 69 66  Resp: 18 18  Temp:  98.4 F (36.9 C)   Vitals:   06/08/17 0618 06/08/17 0843 06/08/17 1400 06/08/17 1437  BP: 140/66     Pulse: (!) 59 61 69 66  Resp: 19 18 18 18   Temp: 99.2 F (37.3 C)   98.4 F (36.9 C)  TempSrc: Oral   Oral  SpO2: 99% 98% 98% 98%  Weight:      Height:        General: Pt is alert, awake, not in acute distress Cardiovascular: RRR, S1/S2 +, no rubs, no gallops Respiratory: CTA bilaterally, no wheezing, no rhonchi Abdominal: Soft, NT, ND, bowel sounds + Extremities: no edema, no cyanosis    The results of significant diagnostics from this hospitalization (including imaging, microbiology, ancillary and laboratory) are listed below for reference.     Microbiology: No results found for this or any previous visit (from the past 240 hour(s)).   Labs: BNP (last 3 results)  Recent Labs  06/04/17 1315  BNP 419.6*   Basic Metabolic Panel:  Recent Labs Lab 06/04/17 1315 06/04/17 1540 06/05/17 0516 06/06/17  9326 06/07/17 0607 06/08/17 0530  NA 137 135 138 139 136 136  K 4.8 5.5* 4.7 4.6 4.6 4.5  CL 107 105 110 110 109 108  CO2 18* 23 22 22 23 23   GLUCOSE 105* 86 105* 107* 112* 99  BUN 45* 46* 40* 36* 33* 32*  CREATININE 2.64* 2.57* 2.34* 2.20* 1.99* 1.91*  CALCIUM 8.9 8.6* 8.2* 8.1* 8.2* 8.1*  MG 2.3  --   --   --   --   --   PHOS  --  4.1  --   --   --   --    Liver Function Tests:  Recent Labs Lab 06/04/17 1315 06/04/17 1540 06/06/17 0307 06/07/17 0607 06/08/17 0530  AST 32 36 20 19 19   ALT 21 23 15* 14* 15*  ALKPHOS 74 74 68 72 66  BILITOT 0.5 0.7 0.5 0.4 0.6  PROT 7.0 6.7 5.7* 5.4* 6.0*  ALBUMIN 4.0 3.8 3.0* 2.7* 2.9*   No results for input(s): LIPASE, AMYLASE in the last 168 hours.  Recent Labs Lab 06/04/17 1910  AMMONIA 14   CBC:  Recent Labs Lab  06/04/17 1315 06/05/17 0516 06/06/17 0307 06/07/17 0607 06/08/17 0530 06/08/17 0754  WBC 5.5 6.7 6.9 6.2 6.7 7.4  NEUTROABS 3.2 4.2  --   --   --   --   HGB 10.1* 8.9* 8.6* 8.4* 8.5* 8.7*  HCT 31.5* 28.3* 27.8* 26.9* 26.7* 27.4*  MCV 97.2 99.3 100.0 99.3 98.9 97.5  PLT 257 236 240 214 216 227   Cardiac Enzymes:  Recent Labs Lab 06/04/17 1315 06/04/17 1540 06/04/17 1910 06/04/17 2300  TROPONINI <0.03 <0.03 <0.03 <0.03   BNP: Invalid input(s): POCBNP CBG: No results for input(s): GLUCAP in the last 168 hours. D-Dimer No results for input(s): DDIMER in the last 72 hours. Hgb A1c No results for input(s): HGBA1C in the last 72 hours. Lipid Profile No results for input(s): CHOL, HDL, LDLCALC, TRIG, CHOLHDL, LDLDIRECT in the last 72 hours. Thyroid function studies  Recent Labs  06/06/17 1425  TSH 62.892*   Anemia work up No results for input(s): VITAMINB12, FOLATE, FERRITIN, TIBC, IRON, RETICCTPCT in the last 72 hours. Urinalysis    Component Value Date/Time   COLORURINE YELLOW 06/04/2017 1618   APPEARANCEUR CLEAR 06/04/2017 1618   LABSPEC 1.011 06/04/2017 1618   PHURINE 6.0 06/04/2017 1618   GLUCOSEU NEGATIVE 06/04/2017 1618   GLUCOSEU NEGATIVE 09/28/2008 1243   HGBUR NEGATIVE 06/04/2017 1618   BILIRUBINUR NEGATIVE 06/04/2017 1618   KETONESUR NEGATIVE 06/04/2017 1618   PROTEINUR NEGATIVE 06/04/2017 1618   UROBILINOGEN 0.2 mg/dL 09/28/2008 1243   NITRITE NEGATIVE 06/04/2017 1618   LEUKOCYTESUR NEGATIVE 06/04/2017 1618   Sepsis Labs Invalid input(s): PROCALCITONIN,  WBC,  LACTICIDVEN Microbiology No results found for this or any previous visit (from the past 240 hour(s)).   Time coordinating discharge: Over 30 minutes  SIGNED:   Waldron Session, MD  [facility] 06/08/2017, 3:40 PM Pager   If 7PM-7AM, please contact night-coverage [email or web address] Password [email or web address]

## 2017-06-08 NOTE — Progress Notes (Signed)
Pt transported to [facility] via EMS. VSS with skin intact. Report called to [facility] by Jeneen Rinks. AVS packet sent with patient.

## 2017-06-09 DIAGNOSIS — R5381 Other malaise: Secondary | ICD-10-CM | POA: Diagnosis not present

## 2017-06-09 DIAGNOSIS — I10 Essential (primary) hypertension: Secondary | ICD-10-CM | POA: Diagnosis not present

## 2017-06-09 DIAGNOSIS — J9600 Acute respiratory failure, unspecified whether with hypoxia or hypercapnia: Secondary | ICD-10-CM | POA: Diagnosis not present

## 2017-06-09 DIAGNOSIS — E039 Hypothyroidism, unspecified: Secondary | ICD-10-CM | POA: Diagnosis not present

## 2017-06-09 DIAGNOSIS — I4891 Unspecified atrial fibrillation: Secondary | ICD-10-CM | POA: Diagnosis not present

## 2017-06-09 NOTE — Consult Note (Signed)
           [facility] CM Primary Care Navigator  06/09/2017  [name] 1924-04-08 [id number]   Went to see patient at the bedside to identify possible discharge needs but he was already discharged.  Patient was discharged to skilled nursing facility([facility]) yesterday prior to returning back home per therapy recommendation.  Per MD note, patient was admitted for acute encephalopathy, urinary retention and hypothyroidism.  Primary care provider's office called ([facility]) and notified of patient's discharge, need for post skilled nursing facility discharge follow-up and transition of care as well as health issues needing follow-up.   Made aware to refer to [facility] care management if deemed appropriate for services   For questions, please contact:  Dannielle Huh, BSN, RN- [facility] Primary Care Navigator  Telephone: [phone] [place]

## 2017-06-13 DIAGNOSIS — L03113 Cellulitis of right upper limb: Secondary | ICD-10-CM | POA: Diagnosis not present

## 2017-06-29 ENCOUNTER — Telehealth: Payer: Self-pay | Admitting: Internal Medicine

## 2017-06-29 NOTE — Telephone Encounter (Signed)
Patient daughter Stevphen Meuse) calling, states that her father was recently diagnosed with CHF and she would like to know more about diagnosis as her mother was not aware.

## 2017-06-29 NOTE — Telephone Encounter (Signed)
Spoke with patient's wife and daughter who called with questions about patient's CHF. I reviewed the results of the echo on 7/6 and answered their questions about the terminology. The daughter states she is concerned about heart failure and medications that the patient might need. She states the patient is fatigued and has increasing confusion. I advised that the patient's mild diastolic dysfunction would not likely be the cause of his confusion or fatigue. I asked if she is aware of patient's recent diagnoses from hospitalization and she states only heart failure. I advised her of the discharge diagnoses and advised her to call the patient's PCP. She thanked me for the call.

## 2017-07-09 DIAGNOSIS — I480 Paroxysmal atrial fibrillation: Secondary | ICD-10-CM | POA: Diagnosis not present

## 2017-07-09 DIAGNOSIS — I10 Essential (primary) hypertension: Secondary | ICD-10-CM | POA: Diagnosis not present

## 2017-07-09 DIAGNOSIS — N320 Bladder-neck obstruction: Secondary | ICD-10-CM | POA: Diagnosis not present

## 2017-07-09 DIAGNOSIS — D649 Anemia, unspecified: Secondary | ICD-10-CM | POA: Diagnosis not present

## 2017-07-14 DIAGNOSIS — S8011XA Contusion of right lower leg, initial encounter: Secondary | ICD-10-CM | POA: Diagnosis not present

## 2017-07-19 DIAGNOSIS — R609 Edema, unspecified: Secondary | ICD-10-CM | POA: Diagnosis not present

## 2017-07-19 DIAGNOSIS — N320 Bladder-neck obstruction: Secondary | ICD-10-CM | POA: Diagnosis not present

## 2017-07-30 DIAGNOSIS — R1310 Dysphagia, unspecified: Secondary | ICD-10-CM | POA: Diagnosis not present

## 2017-07-30 DIAGNOSIS — N401 Enlarged prostate with lower urinary tract symptoms: Secondary | ICD-10-CM | POA: Diagnosis not present

## 2017-07-30 DIAGNOSIS — I4891 Unspecified atrial fibrillation: Secondary | ICD-10-CM | POA: Diagnosis not present

## 2017-07-30 DIAGNOSIS — I679 Cerebrovascular disease, unspecified: Secondary | ICD-10-CM | POA: Diagnosis not present

## 2017-07-30 DIAGNOSIS — E039 Hypothyroidism, unspecified: Secondary | ICD-10-CM | POA: Diagnosis not present

## 2017-07-30 DIAGNOSIS — I495 Sick sinus syndrome: Secondary | ICD-10-CM | POA: Diagnosis not present

## 2017-07-30 DIAGNOSIS — F0150 Vascular dementia without behavioral disturbance: Secondary | ICD-10-CM | POA: Diagnosis not present

## 2017-07-30 DIAGNOSIS — I672 Cerebral atherosclerosis: Secondary | ICD-10-CM | POA: Diagnosis not present

## 2017-07-30 DIAGNOSIS — E785 Hyperlipidemia, unspecified: Secondary | ICD-10-CM | POA: Diagnosis not present

## 2017-07-30 DIAGNOSIS — I10 Essential (primary) hypertension: Secondary | ICD-10-CM | POA: Diagnosis not present

## 2017-07-31 DIAGNOSIS — I4891 Unspecified atrial fibrillation: Secondary | ICD-10-CM | POA: Diagnosis not present

## 2017-07-31 DIAGNOSIS — I679 Cerebrovascular disease, unspecified: Secondary | ICD-10-CM | POA: Diagnosis not present

## 2017-07-31 DIAGNOSIS — I10 Essential (primary) hypertension: Secondary | ICD-10-CM | POA: Diagnosis not present

## 2017-07-31 DIAGNOSIS — I672 Cerebral atherosclerosis: Secondary | ICD-10-CM | POA: Diagnosis not present

## 2017-07-31 DIAGNOSIS — E785 Hyperlipidemia, unspecified: Secondary | ICD-10-CM | POA: Diagnosis not present

## 2017-07-31 DIAGNOSIS — F0150 Vascular dementia without behavioral disturbance: Secondary | ICD-10-CM | POA: Diagnosis not present

## 2017-08-01 DIAGNOSIS — E785 Hyperlipidemia, unspecified: Secondary | ICD-10-CM | POA: Diagnosis not present

## 2017-08-01 DIAGNOSIS — F0150 Vascular dementia without behavioral disturbance: Secondary | ICD-10-CM | POA: Diagnosis not present

## 2017-08-01 DIAGNOSIS — E039 Hypothyroidism, unspecified: Secondary | ICD-10-CM | POA: Diagnosis not present

## 2017-08-01 DIAGNOSIS — N401 Enlarged prostate with lower urinary tract symptoms: Secondary | ICD-10-CM | POA: Diagnosis not present

## 2017-08-01 DIAGNOSIS — I679 Cerebrovascular disease, unspecified: Secondary | ICD-10-CM | POA: Diagnosis not present

## 2017-08-01 DIAGNOSIS — I10 Essential (primary) hypertension: Secondary | ICD-10-CM | POA: Diagnosis not present

## 2017-08-01 DIAGNOSIS — I672 Cerebral atherosclerosis: Secondary | ICD-10-CM | POA: Diagnosis not present

## 2017-08-01 DIAGNOSIS — R1310 Dysphagia, unspecified: Secondary | ICD-10-CM | POA: Diagnosis not present

## 2017-08-01 DIAGNOSIS — I4891 Unspecified atrial fibrillation: Secondary | ICD-10-CM | POA: Diagnosis not present

## 2017-08-01 DIAGNOSIS — I495 Sick sinus syndrome: Secondary | ICD-10-CM | POA: Diagnosis not present

## 2017-08-03 DIAGNOSIS — I10 Essential (primary) hypertension: Secondary | ICD-10-CM | POA: Diagnosis not present

## 2017-08-03 DIAGNOSIS — E785 Hyperlipidemia, unspecified: Secondary | ICD-10-CM | POA: Diagnosis not present

## 2017-08-03 DIAGNOSIS — I679 Cerebrovascular disease, unspecified: Secondary | ICD-10-CM | POA: Diagnosis not present

## 2017-08-03 DIAGNOSIS — F0150 Vascular dementia without behavioral disturbance: Secondary | ICD-10-CM | POA: Diagnosis not present

## 2017-08-03 DIAGNOSIS — I4891 Unspecified atrial fibrillation: Secondary | ICD-10-CM | POA: Diagnosis not present

## 2017-08-03 DIAGNOSIS — I672 Cerebral atherosclerosis: Secondary | ICD-10-CM | POA: Diagnosis not present

## 2017-08-04 DIAGNOSIS — I10 Essential (primary) hypertension: Secondary | ICD-10-CM | POA: Diagnosis not present

## 2017-08-04 DIAGNOSIS — I4891 Unspecified atrial fibrillation: Secondary | ICD-10-CM | POA: Diagnosis not present

## 2017-08-04 DIAGNOSIS — F0150 Vascular dementia without behavioral disturbance: Secondary | ICD-10-CM | POA: Diagnosis not present

## 2017-08-04 DIAGNOSIS — I672 Cerebral atherosclerosis: Secondary | ICD-10-CM | POA: Diagnosis not present

## 2017-08-04 DIAGNOSIS — I679 Cerebrovascular disease, unspecified: Secondary | ICD-10-CM | POA: Diagnosis not present

## 2017-08-04 DIAGNOSIS — E785 Hyperlipidemia, unspecified: Secondary | ICD-10-CM | POA: Diagnosis not present

## 2017-08-05 ENCOUNTER — Ambulatory Visit (INDEPENDENT_AMBULATORY_CARE_PROVIDER_SITE_OTHER): Payer: Medicare Other | Admitting: Cardiovascular Disease

## 2017-08-05 DIAGNOSIS — I4891 Unspecified atrial fibrillation: Secondary | ICD-10-CM

## 2017-08-05 DIAGNOSIS — Z5181 Encounter for therapeutic drug level monitoring: Secondary | ICD-10-CM | POA: Diagnosis not present

## 2017-08-05 DIAGNOSIS — I672 Cerebral atherosclerosis: Secondary | ICD-10-CM | POA: Diagnosis not present

## 2017-08-05 DIAGNOSIS — E785 Hyperlipidemia, unspecified: Secondary | ICD-10-CM | POA: Diagnosis not present

## 2017-08-05 DIAGNOSIS — I10 Essential (primary) hypertension: Secondary | ICD-10-CM | POA: Diagnosis not present

## 2017-08-05 DIAGNOSIS — F0150 Vascular dementia without behavioral disturbance: Secondary | ICD-10-CM | POA: Diagnosis not present

## 2017-08-05 DIAGNOSIS — I679 Cerebrovascular disease, unspecified: Secondary | ICD-10-CM | POA: Diagnosis not present

## 2017-08-05 LAB — POCT INR: INR: 1.3

## 2017-08-07 DIAGNOSIS — I10 Essential (primary) hypertension: Secondary | ICD-10-CM | POA: Diagnosis not present

## 2017-08-07 DIAGNOSIS — I679 Cerebrovascular disease, unspecified: Secondary | ICD-10-CM | POA: Diagnosis not present

## 2017-08-07 DIAGNOSIS — I672 Cerebral atherosclerosis: Secondary | ICD-10-CM | POA: Diagnosis not present

## 2017-08-07 DIAGNOSIS — E785 Hyperlipidemia, unspecified: Secondary | ICD-10-CM | POA: Diagnosis not present

## 2017-08-07 DIAGNOSIS — F0150 Vascular dementia without behavioral disturbance: Secondary | ICD-10-CM | POA: Diagnosis not present

## 2017-08-07 DIAGNOSIS — I4891 Unspecified atrial fibrillation: Secondary | ICD-10-CM | POA: Diagnosis not present

## 2017-08-11 ENCOUNTER — Telehealth: Payer: Self-pay | Admitting: Internal Medicine

## 2017-08-11 ENCOUNTER — Ambulatory Visit (INDEPENDENT_AMBULATORY_CARE_PROVIDER_SITE_OTHER): Payer: Medicare Other | Admitting: Cardiovascular Disease

## 2017-08-11 DIAGNOSIS — Z5181 Encounter for therapeutic drug level monitoring: Secondary | ICD-10-CM

## 2017-08-11 DIAGNOSIS — I679 Cerebrovascular disease, unspecified: Secondary | ICD-10-CM | POA: Diagnosis not present

## 2017-08-11 DIAGNOSIS — I672 Cerebral atherosclerosis: Secondary | ICD-10-CM | POA: Diagnosis not present

## 2017-08-11 DIAGNOSIS — F0150 Vascular dementia without behavioral disturbance: Secondary | ICD-10-CM | POA: Diagnosis not present

## 2017-08-11 DIAGNOSIS — E785 Hyperlipidemia, unspecified: Secondary | ICD-10-CM | POA: Diagnosis not present

## 2017-08-11 DIAGNOSIS — I10 Essential (primary) hypertension: Secondary | ICD-10-CM | POA: Diagnosis not present

## 2017-08-11 DIAGNOSIS — I4891 Unspecified atrial fibrillation: Secondary | ICD-10-CM

## 2017-08-11 LAB — POCT INR: INR: 2.6

## 2017-08-11 NOTE — Telephone Encounter (Signed)
Returned a call to [facility] RN, please refer to Anticoagulation Encounter.

## 2017-08-11 NOTE — Telephone Encounter (Signed)
New Message  INR 2.6 PT 31.2

## 2017-08-13 DIAGNOSIS — E785 Hyperlipidemia, unspecified: Secondary | ICD-10-CM | POA: Diagnosis not present

## 2017-08-13 DIAGNOSIS — I10 Essential (primary) hypertension: Secondary | ICD-10-CM | POA: Diagnosis not present

## 2017-08-13 DIAGNOSIS — F0150 Vascular dementia without behavioral disturbance: Secondary | ICD-10-CM | POA: Diagnosis not present

## 2017-08-13 DIAGNOSIS — I4891 Unspecified atrial fibrillation: Secondary | ICD-10-CM | POA: Diagnosis not present

## 2017-08-13 DIAGNOSIS — I679 Cerebrovascular disease, unspecified: Secondary | ICD-10-CM | POA: Diagnosis not present

## 2017-08-13 DIAGNOSIS — I672 Cerebral atherosclerosis: Secondary | ICD-10-CM | POA: Diagnosis not present

## 2017-08-14 DIAGNOSIS — I4891 Unspecified atrial fibrillation: Secondary | ICD-10-CM | POA: Diagnosis not present

## 2017-08-14 DIAGNOSIS — I672 Cerebral atherosclerosis: Secondary | ICD-10-CM | POA: Diagnosis not present

## 2017-08-14 DIAGNOSIS — F0150 Vascular dementia without behavioral disturbance: Secondary | ICD-10-CM | POA: Diagnosis not present

## 2017-08-14 DIAGNOSIS — I10 Essential (primary) hypertension: Secondary | ICD-10-CM | POA: Diagnosis not present

## 2017-08-14 DIAGNOSIS — E785 Hyperlipidemia, unspecified: Secondary | ICD-10-CM | POA: Diagnosis not present

## 2017-08-14 DIAGNOSIS — I679 Cerebrovascular disease, unspecified: Secondary | ICD-10-CM | POA: Diagnosis not present

## 2017-08-16 DIAGNOSIS — I679 Cerebrovascular disease, unspecified: Secondary | ICD-10-CM | POA: Diagnosis not present

## 2017-08-16 DIAGNOSIS — E785 Hyperlipidemia, unspecified: Secondary | ICD-10-CM | POA: Diagnosis not present

## 2017-08-16 DIAGNOSIS — F0150 Vascular dementia without behavioral disturbance: Secondary | ICD-10-CM | POA: Diagnosis not present

## 2017-08-16 DIAGNOSIS — I4891 Unspecified atrial fibrillation: Secondary | ICD-10-CM | POA: Diagnosis not present

## 2017-08-16 DIAGNOSIS — I672 Cerebral atherosclerosis: Secondary | ICD-10-CM | POA: Diagnosis not present

## 2017-08-16 DIAGNOSIS — I10 Essential (primary) hypertension: Secondary | ICD-10-CM | POA: Diagnosis not present

## 2017-08-21 DIAGNOSIS — I672 Cerebral atherosclerosis: Secondary | ICD-10-CM | POA: Diagnosis not present

## 2017-08-21 DIAGNOSIS — E785 Hyperlipidemia, unspecified: Secondary | ICD-10-CM | POA: Diagnosis not present

## 2017-08-21 DIAGNOSIS — I679 Cerebrovascular disease, unspecified: Secondary | ICD-10-CM | POA: Diagnosis not present

## 2017-08-21 DIAGNOSIS — I10 Essential (primary) hypertension: Secondary | ICD-10-CM | POA: Diagnosis not present

## 2017-08-21 DIAGNOSIS — I4891 Unspecified atrial fibrillation: Secondary | ICD-10-CM | POA: Diagnosis not present

## 2017-08-21 DIAGNOSIS — F0150 Vascular dementia without behavioral disturbance: Secondary | ICD-10-CM | POA: Diagnosis not present

## 2017-08-24 DIAGNOSIS — F0150 Vascular dementia without behavioral disturbance: Secondary | ICD-10-CM | POA: Diagnosis not present

## 2017-08-24 DIAGNOSIS — I10 Essential (primary) hypertension: Secondary | ICD-10-CM | POA: Diagnosis not present

## 2017-08-24 DIAGNOSIS — I672 Cerebral atherosclerosis: Secondary | ICD-10-CM | POA: Diagnosis not present

## 2017-08-24 DIAGNOSIS — E785 Hyperlipidemia, unspecified: Secondary | ICD-10-CM | POA: Diagnosis not present

## 2017-08-24 DIAGNOSIS — I4891 Unspecified atrial fibrillation: Secondary | ICD-10-CM | POA: Diagnosis not present

## 2017-08-24 DIAGNOSIS — I679 Cerebrovascular disease, unspecified: Secondary | ICD-10-CM | POA: Diagnosis not present

## 2017-08-26 ENCOUNTER — Other Ambulatory Visit (HOSPITAL_COMMUNITY)
Admission: RE | Admit: 2017-08-26 | Discharge: 2017-08-26 | Disposition: A | Discharge status: Home / Self Care | Source: Other Acute Inpatient Hospital | Attending: Internal Medicine | Admitting: Internal Medicine

## 2017-08-26 ENCOUNTER — Ambulatory Visit (INDEPENDENT_AMBULATORY_CARE_PROVIDER_SITE_OTHER): Admitting: Internal Medicine

## 2017-08-26 DIAGNOSIS — I4891 Unspecified atrial fibrillation: Secondary | ICD-10-CM

## 2017-08-26 DIAGNOSIS — I10 Essential (primary) hypertension: Secondary | ICD-10-CM | POA: Diagnosis not present

## 2017-08-26 DIAGNOSIS — F0150 Vascular dementia without behavioral disturbance: Secondary | ICD-10-CM | POA: Diagnosis not present

## 2017-08-26 DIAGNOSIS — I5032 Chronic diastolic (congestive) heart failure: Secondary | ICD-10-CM | POA: Diagnosis present

## 2017-08-26 DIAGNOSIS — I679 Cerebrovascular disease, unspecified: Secondary | ICD-10-CM | POA: Diagnosis not present

## 2017-08-26 DIAGNOSIS — Z5181 Encounter for therapeutic drug level monitoring: Secondary | ICD-10-CM

## 2017-08-26 DIAGNOSIS — E785 Hyperlipidemia, unspecified: Secondary | ICD-10-CM | POA: Diagnosis not present

## 2017-08-26 DIAGNOSIS — I672 Cerebral atherosclerosis: Secondary | ICD-10-CM | POA: Diagnosis not present

## 2017-08-26 LAB — PROTIME-INR
INR: 4.75
PROTHROMBIN TIME: 44.1 s — AB (ref 11.4–15.2)

## 2017-08-31 DIAGNOSIS — F0150 Vascular dementia without behavioral disturbance: Secondary | ICD-10-CM | POA: Diagnosis not present

## 2017-08-31 DIAGNOSIS — E785 Hyperlipidemia, unspecified: Secondary | ICD-10-CM | POA: Diagnosis not present

## 2017-08-31 DIAGNOSIS — N401 Enlarged prostate with lower urinary tract symptoms: Secondary | ICD-10-CM | POA: Diagnosis not present

## 2017-08-31 DIAGNOSIS — I495 Sick sinus syndrome: Secondary | ICD-10-CM | POA: Diagnosis not present

## 2017-08-31 DIAGNOSIS — R1310 Dysphagia, unspecified: Secondary | ICD-10-CM | POA: Diagnosis not present

## 2017-08-31 DIAGNOSIS — E039 Hypothyroidism, unspecified: Secondary | ICD-10-CM | POA: Diagnosis not present

## 2017-08-31 DIAGNOSIS — I679 Cerebrovascular disease, unspecified: Secondary | ICD-10-CM | POA: Diagnosis not present

## 2017-08-31 DIAGNOSIS — I4891 Unspecified atrial fibrillation: Secondary | ICD-10-CM | POA: Diagnosis not present

## 2017-08-31 DIAGNOSIS — I10 Essential (primary) hypertension: Secondary | ICD-10-CM | POA: Diagnosis not present

## 2017-08-31 DIAGNOSIS — I672 Cerebral atherosclerosis: Secondary | ICD-10-CM | POA: Diagnosis not present

## 2017-09-01 ENCOUNTER — Ambulatory Visit (INDEPENDENT_AMBULATORY_CARE_PROVIDER_SITE_OTHER): Admitting: Cardiology

## 2017-09-01 DIAGNOSIS — I10 Essential (primary) hypertension: Secondary | ICD-10-CM | POA: Diagnosis not present

## 2017-09-01 DIAGNOSIS — I672 Cerebral atherosclerosis: Secondary | ICD-10-CM | POA: Diagnosis not present

## 2017-09-01 DIAGNOSIS — I4891 Unspecified atrial fibrillation: Secondary | ICD-10-CM | POA: Diagnosis not present

## 2017-09-01 DIAGNOSIS — Z5181 Encounter for therapeutic drug level monitoring: Secondary | ICD-10-CM

## 2017-09-01 DIAGNOSIS — F0150 Vascular dementia without behavioral disturbance: Secondary | ICD-10-CM | POA: Diagnosis not present

## 2017-09-01 DIAGNOSIS — E785 Hyperlipidemia, unspecified: Secondary | ICD-10-CM | POA: Diagnosis not present

## 2017-09-01 DIAGNOSIS — I679 Cerebrovascular disease, unspecified: Secondary | ICD-10-CM | POA: Diagnosis not present

## 2017-09-01 LAB — PROTIME-INR: INR: 4.6 — AB (ref 0.9–1.1)

## 2017-09-01 LAB — POCT INR: INR: 7.7

## 2017-09-04 DIAGNOSIS — I4891 Unspecified atrial fibrillation: Secondary | ICD-10-CM | POA: Diagnosis not present

## 2017-09-04 DIAGNOSIS — I672 Cerebral atherosclerosis: Secondary | ICD-10-CM | POA: Diagnosis not present

## 2017-09-04 DIAGNOSIS — I679 Cerebrovascular disease, unspecified: Secondary | ICD-10-CM | POA: Diagnosis not present

## 2017-09-04 DIAGNOSIS — E785 Hyperlipidemia, unspecified: Secondary | ICD-10-CM | POA: Diagnosis not present

## 2017-09-04 DIAGNOSIS — I10 Essential (primary) hypertension: Secondary | ICD-10-CM | POA: Diagnosis not present

## 2017-09-04 DIAGNOSIS — F0150 Vascular dementia without behavioral disturbance: Secondary | ICD-10-CM | POA: Diagnosis not present

## 2017-09-06 DIAGNOSIS — F0150 Vascular dementia without behavioral disturbance: Secondary | ICD-10-CM | POA: Diagnosis not present

## 2017-09-06 DIAGNOSIS — E785 Hyperlipidemia, unspecified: Secondary | ICD-10-CM | POA: Diagnosis not present

## 2017-09-06 DIAGNOSIS — I679 Cerebrovascular disease, unspecified: Secondary | ICD-10-CM | POA: Diagnosis not present

## 2017-09-06 DIAGNOSIS — I4891 Unspecified atrial fibrillation: Secondary | ICD-10-CM | POA: Diagnosis not present

## 2017-09-06 DIAGNOSIS — I10 Essential (primary) hypertension: Secondary | ICD-10-CM | POA: Diagnosis not present

## 2017-09-06 DIAGNOSIS — I672 Cerebral atherosclerosis: Secondary | ICD-10-CM | POA: Diagnosis not present

## 2017-09-08 ENCOUNTER — Ambulatory Visit (INDEPENDENT_AMBULATORY_CARE_PROVIDER_SITE_OTHER): Admitting: Interventional Cardiology

## 2017-09-08 DIAGNOSIS — I679 Cerebrovascular disease, unspecified: Secondary | ICD-10-CM | POA: Diagnosis not present

## 2017-09-08 DIAGNOSIS — I4891 Unspecified atrial fibrillation: Secondary | ICD-10-CM

## 2017-09-08 DIAGNOSIS — F0150 Vascular dementia without behavioral disturbance: Secondary | ICD-10-CM | POA: Diagnosis not present

## 2017-09-08 DIAGNOSIS — I10 Essential (primary) hypertension: Secondary | ICD-10-CM | POA: Diagnosis not present

## 2017-09-08 DIAGNOSIS — E785 Hyperlipidemia, unspecified: Secondary | ICD-10-CM | POA: Diagnosis not present

## 2017-09-08 DIAGNOSIS — Z5181 Encounter for therapeutic drug level monitoring: Secondary | ICD-10-CM | POA: Diagnosis not present

## 2017-09-08 DIAGNOSIS — I672 Cerebral atherosclerosis: Secondary | ICD-10-CM | POA: Diagnosis not present

## 2017-09-08 LAB — POCT INR: INR: 2.4

## 2017-09-14 ENCOUNTER — Ambulatory Visit (INDEPENDENT_AMBULATORY_CARE_PROVIDER_SITE_OTHER): Admitting: *Deleted

## 2017-09-14 DIAGNOSIS — I480 Paroxysmal atrial fibrillation: Secondary | ICD-10-CM

## 2017-09-14 DIAGNOSIS — E039 Hypothyroidism, unspecified: Secondary | ICD-10-CM | POA: Diagnosis not present

## 2017-09-14 DIAGNOSIS — R001 Bradycardia, unspecified: Secondary | ICD-10-CM | POA: Diagnosis not present

## 2017-09-14 DIAGNOSIS — I495 Sick sinus syndrome: Secondary | ICD-10-CM

## 2017-09-14 LAB — CUP PACEART INCLINIC DEVICE CHECK
Battery Impedance: 3300 Ohm
Battery Voltage: 2.76 V
Brady Statistic RA Percent Paced: 51 %
Implantable Lead Implant Date: 19961031
Implantable Pulse Generator Implant Date: 20081103
Lead Channel Impedance Value: 351 Ohm
Lead Channel Pacing Threshold Amplitude: 0.75 V
Lead Channel Pacing Threshold Pulse Width: 0.4 ms
Lead Channel Sensing Intrinsic Amplitude: 0.5 mV
Lead Channel Setting Pacing Amplitude: 3 V
Lead Channel Setting Pacing Amplitude: 5 V
Lead Channel Setting Pacing Pulse Width: 0.4 ms
MDC IDC LEAD IMPLANT DT: 19961031
MDC IDC LEAD LOCATION: 753859
MDC IDC LEAD LOCATION: 753860
MDC IDC MSMT LEADCHNL RA PACING THRESHOLD PULSEWIDTH: 0.6 ms
MDC IDC MSMT LEADCHNL RV IMPEDANCE VALUE: 439 Ohm
MDC IDC MSMT LEADCHNL RV PACING THRESHOLD AMPLITUDE: 1.5 V
MDC IDC MSMT LEADCHNL RV SENSING INTR AMPL: 10.8 mV
MDC IDC SESS DTM: 20181015104829
MDC IDC SET LEADCHNL RV SENSING SENSITIVITY: 2 mV
MDC IDC STAT BRADY RV PERCENT PACED: 3.7 %
Pulse Gen Serial Number: 1990093

## 2017-09-14 NOTE — Progress Notes (Signed)
Pacemaker check in clinic. Normal device function. Thresholds, sensing, impedances consistent with previous measurements. Device programmed to maximize longevity. 8.9% mode switched- known AF, probable undersensing, unable to make RA lead more sensitive. 81mg  ASA daily, coumadin discontinued by Hospice provider. Device programmed at appropriate safety margins. Histogram distribution appropriate for patient activity level. Device programmed to optimize intrinsic conduction. Estimated longevity 0.75-2 years. ROV with JA in April 2019.

## 2017-09-16 DIAGNOSIS — I10 Essential (primary) hypertension: Secondary | ICD-10-CM | POA: Diagnosis not present

## 2017-09-16 DIAGNOSIS — F0150 Vascular dementia without behavioral disturbance: Secondary | ICD-10-CM | POA: Diagnosis not present

## 2017-09-16 DIAGNOSIS — I672 Cerebral atherosclerosis: Secondary | ICD-10-CM | POA: Diagnosis not present

## 2017-09-16 DIAGNOSIS — E785 Hyperlipidemia, unspecified: Secondary | ICD-10-CM | POA: Diagnosis not present

## 2017-09-16 DIAGNOSIS — I679 Cerebrovascular disease, unspecified: Secondary | ICD-10-CM | POA: Diagnosis not present

## 2017-09-16 DIAGNOSIS — I4891 Unspecified atrial fibrillation: Secondary | ICD-10-CM | POA: Diagnosis not present

## 2017-09-20 DIAGNOSIS — E785 Hyperlipidemia, unspecified: Secondary | ICD-10-CM | POA: Diagnosis not present

## 2017-09-20 DIAGNOSIS — I672 Cerebral atherosclerosis: Secondary | ICD-10-CM | POA: Diagnosis not present

## 2017-09-20 DIAGNOSIS — I4891 Unspecified atrial fibrillation: Secondary | ICD-10-CM | POA: Diagnosis not present

## 2017-09-20 DIAGNOSIS — I10 Essential (primary) hypertension: Secondary | ICD-10-CM | POA: Diagnosis not present

## 2017-09-20 DIAGNOSIS — F0150 Vascular dementia without behavioral disturbance: Secondary | ICD-10-CM | POA: Diagnosis not present

## 2017-09-20 DIAGNOSIS — I679 Cerebrovascular disease, unspecified: Secondary | ICD-10-CM | POA: Diagnosis not present

## 2017-09-21 DIAGNOSIS — I10 Essential (primary) hypertension: Secondary | ICD-10-CM | POA: Diagnosis not present

## 2017-09-21 DIAGNOSIS — I4891 Unspecified atrial fibrillation: Secondary | ICD-10-CM | POA: Diagnosis not present

## 2017-09-21 DIAGNOSIS — E785 Hyperlipidemia, unspecified: Secondary | ICD-10-CM | POA: Diagnosis not present

## 2017-09-21 DIAGNOSIS — I672 Cerebral atherosclerosis: Secondary | ICD-10-CM | POA: Diagnosis not present

## 2017-09-21 DIAGNOSIS — I679 Cerebrovascular disease, unspecified: Secondary | ICD-10-CM | POA: Diagnosis not present

## 2017-09-21 DIAGNOSIS — F0150 Vascular dementia without behavioral disturbance: Secondary | ICD-10-CM | POA: Diagnosis not present

## 2017-09-25 DIAGNOSIS — I672 Cerebral atherosclerosis: Secondary | ICD-10-CM | POA: Diagnosis not present

## 2017-09-25 DIAGNOSIS — I4891 Unspecified atrial fibrillation: Secondary | ICD-10-CM | POA: Diagnosis not present

## 2017-09-25 DIAGNOSIS — F0150 Vascular dementia without behavioral disturbance: Secondary | ICD-10-CM | POA: Diagnosis not present

## 2017-09-25 DIAGNOSIS — I10 Essential (primary) hypertension: Secondary | ICD-10-CM | POA: Diagnosis not present

## 2017-09-25 DIAGNOSIS — E785 Hyperlipidemia, unspecified: Secondary | ICD-10-CM | POA: Diagnosis not present

## 2017-09-25 DIAGNOSIS — I679 Cerebrovascular disease, unspecified: Secondary | ICD-10-CM | POA: Diagnosis not present

## 2017-09-30 DIAGNOSIS — I679 Cerebrovascular disease, unspecified: Secondary | ICD-10-CM | POA: Diagnosis not present

## 2017-09-30 DIAGNOSIS — E785 Hyperlipidemia, unspecified: Secondary | ICD-10-CM | POA: Diagnosis not present

## 2017-09-30 DIAGNOSIS — I4891 Unspecified atrial fibrillation: Secondary | ICD-10-CM | POA: Diagnosis not present

## 2017-09-30 DIAGNOSIS — I10 Essential (primary) hypertension: Secondary | ICD-10-CM | POA: Diagnosis not present

## 2017-09-30 DIAGNOSIS — I672 Cerebral atherosclerosis: Secondary | ICD-10-CM | POA: Diagnosis not present

## 2017-09-30 DIAGNOSIS — F0150 Vascular dementia without behavioral disturbance: Secondary | ICD-10-CM | POA: Diagnosis not present

## 2017-10-01 DIAGNOSIS — I672 Cerebral atherosclerosis: Secondary | ICD-10-CM | POA: Diagnosis not present

## 2017-10-01 DIAGNOSIS — E785 Hyperlipidemia, unspecified: Secondary | ICD-10-CM | POA: Diagnosis not present

## 2017-10-01 DIAGNOSIS — N401 Enlarged prostate with lower urinary tract symptoms: Secondary | ICD-10-CM | POA: Diagnosis not present

## 2017-10-01 DIAGNOSIS — R1310 Dysphagia, unspecified: Secondary | ICD-10-CM | POA: Diagnosis not present

## 2017-10-01 DIAGNOSIS — I4891 Unspecified atrial fibrillation: Secondary | ICD-10-CM | POA: Diagnosis not present

## 2017-10-01 DIAGNOSIS — I679 Cerebrovascular disease, unspecified: Secondary | ICD-10-CM | POA: Diagnosis not present

## 2017-10-01 DIAGNOSIS — I10 Essential (primary) hypertension: Secondary | ICD-10-CM | POA: Diagnosis not present

## 2017-10-01 DIAGNOSIS — E039 Hypothyroidism, unspecified: Secondary | ICD-10-CM | POA: Diagnosis not present

## 2017-10-01 DIAGNOSIS — I495 Sick sinus syndrome: Secondary | ICD-10-CM | POA: Diagnosis not present

## 2017-10-01 DIAGNOSIS — F0150 Vascular dementia without behavioral disturbance: Secondary | ICD-10-CM | POA: Diagnosis not present

## 2017-10-05 DIAGNOSIS — I679 Cerebrovascular disease, unspecified: Secondary | ICD-10-CM | POA: Diagnosis not present

## 2017-10-05 DIAGNOSIS — I10 Essential (primary) hypertension: Secondary | ICD-10-CM | POA: Diagnosis not present

## 2017-10-05 DIAGNOSIS — E785 Hyperlipidemia, unspecified: Secondary | ICD-10-CM | POA: Diagnosis not present

## 2017-10-05 DIAGNOSIS — I672 Cerebral atherosclerosis: Secondary | ICD-10-CM | POA: Diagnosis not present

## 2017-10-05 DIAGNOSIS — F0150 Vascular dementia without behavioral disturbance: Secondary | ICD-10-CM | POA: Diagnosis not present

## 2017-10-05 DIAGNOSIS — I4891 Unspecified atrial fibrillation: Secondary | ICD-10-CM | POA: Diagnosis not present

## 2017-10-06 DIAGNOSIS — I679 Cerebrovascular disease, unspecified: Secondary | ICD-10-CM | POA: Diagnosis not present

## 2017-10-06 DIAGNOSIS — F0150 Vascular dementia without behavioral disturbance: Secondary | ICD-10-CM | POA: Diagnosis not present

## 2017-10-06 DIAGNOSIS — E785 Hyperlipidemia, unspecified: Secondary | ICD-10-CM | POA: Diagnosis not present

## 2017-10-06 DIAGNOSIS — I10 Essential (primary) hypertension: Secondary | ICD-10-CM | POA: Diagnosis not present

## 2017-10-06 DIAGNOSIS — I672 Cerebral atherosclerosis: Secondary | ICD-10-CM | POA: Diagnosis not present

## 2017-10-06 DIAGNOSIS — I4891 Unspecified atrial fibrillation: Secondary | ICD-10-CM | POA: Diagnosis not present

## 2017-10-31 DIAGNOSIS — 419620001 Death: Secondary | SNOMED CT | POA: Diagnosis not present

## 2017-10-31 DEATH — deceased

## 2018-04-20 ENCOUNTER — Ambulatory Visit: Payer: Medicare Other | Admitting: Neurology
